# Patient Record
Sex: Female | Born: 1955 | Race: Black or African American | Hispanic: No | Marital: Single | Smoking: Never smoker
Health system: Southern US, Community
[De-identification: ages and names within clinical notes are randomized; demographics above are authoritative.]

## PROBLEM LIST (undated history)

## (undated) DIAGNOSIS — Z79891 Long term (current) use of opiate analgesic: Secondary | ICD-10-CM

## (undated) DIAGNOSIS — K219 Gastro-esophageal reflux disease without esophagitis: Secondary | ICD-10-CM

## (undated) DIAGNOSIS — F329 Major depressive disorder, single episode, unspecified: Secondary | ICD-10-CM

## (undated) DIAGNOSIS — C50919 Malignant neoplasm of unspecified site of unspecified female breast: Secondary | ICD-10-CM

## (undated) DIAGNOSIS — I10 Essential (primary) hypertension: Secondary | ICD-10-CM

## (undated) DIAGNOSIS — Z66 Do not resuscitate: Secondary | ICD-10-CM

## (undated) DIAGNOSIS — C801 Malignant (primary) neoplasm, unspecified: Secondary | ICD-10-CM

## (undated) DIAGNOSIS — M25561 Pain in right knee: Secondary | ICD-10-CM

## (undated) DIAGNOSIS — M25562 Pain in left knee: Secondary | ICD-10-CM

## (undated) DIAGNOSIS — M1990 Unspecified osteoarthritis, unspecified site: Secondary | ICD-10-CM

## (undated) DIAGNOSIS — C7951 Secondary malignant neoplasm of bone: Secondary | ICD-10-CM

## (undated) DIAGNOSIS — E7800 Pure hypercholesterolemia, unspecified: Secondary | ICD-10-CM

## (undated) HISTORY — DX: Pain in left knee: M25.562

## (undated) HISTORY — PX: KNEE SURGERY: SHX244

## (undated) HISTORY — DX: Do not resuscitate: Z66

## (undated) HISTORY — DX: Secondary malignant neoplasm of bone: C79.51

## (undated) HISTORY — DX: Long term (current) use of opiate analgesic: Z79.891

## (undated) HISTORY — DX: Pain in right knee: M25.561

## (undated) HISTORY — PX: ABDOMINAL HYSTERECTOMY: SHX81

## (undated) HISTORY — PX: MASTECTOMY: SHX3

## (undated) HISTORY — DX: Malignant neoplasm of unspecified site of unspecified female breast: C50.919

---

## 1999-03-24 ENCOUNTER — Encounter: Admission: RE | Admit: 1999-03-24 | Discharge: 1999-03-26 | Payer: Self-pay | Admitting: *Deleted

## 2003-04-23 ENCOUNTER — Ambulatory Visit (HOSPITAL_COMMUNITY): Admission: RE | Admit: 2003-04-23 | Discharge: 2003-04-23 | Payer: Self-pay | Admitting: Unknown Physician Specialty

## 2003-04-24 ENCOUNTER — Ambulatory Visit (HOSPITAL_COMMUNITY): Admission: RE | Admit: 2003-04-24 | Discharge: 2003-04-24 | Payer: Self-pay | Admitting: Unknown Physician Specialty

## 2003-08-12 ENCOUNTER — Ambulatory Visit (HOSPITAL_COMMUNITY): Admission: RE | Admit: 2003-08-12 | Discharge: 2003-08-12 | Payer: Self-pay | Admitting: Orthopedic Surgery

## 2003-08-14 ENCOUNTER — Encounter (HOSPITAL_COMMUNITY): Admission: RE | Admit: 2003-08-14 | Discharge: 2003-09-13 | Payer: Self-pay | Admitting: Orthopedic Surgery

## 2003-09-15 ENCOUNTER — Encounter (HOSPITAL_COMMUNITY): Admission: RE | Admit: 2003-09-15 | Discharge: 2003-10-15 | Payer: Self-pay | Admitting: Orthopedic Surgery

## 2004-09-08 ENCOUNTER — Ambulatory Visit: Payer: Self-pay | Admitting: Family Medicine

## 2004-12-22 ENCOUNTER — Ambulatory Visit: Payer: Self-pay | Admitting: Family Medicine

## 2005-01-19 ENCOUNTER — Ambulatory Visit: Payer: Self-pay | Admitting: Family Medicine

## 2005-03-15 ENCOUNTER — Ambulatory Visit: Payer: Self-pay | Admitting: Family Medicine

## 2005-04-05 ENCOUNTER — Ambulatory Visit: Payer: Self-pay | Admitting: Family Medicine

## 2005-05-10 ENCOUNTER — Ambulatory Visit: Payer: Self-pay | Admitting: Family Medicine

## 2005-08-23 ENCOUNTER — Ambulatory Visit: Payer: Self-pay | Admitting: Family Medicine

## 2005-09-06 ENCOUNTER — Ambulatory Visit: Payer: Self-pay | Admitting: Family Medicine

## 2005-10-03 ENCOUNTER — Ambulatory Visit: Payer: Self-pay | Admitting: Family Medicine

## 2005-12-07 ENCOUNTER — Ambulatory Visit: Payer: Self-pay | Admitting: Family Medicine

## 2005-12-14 ENCOUNTER — Ambulatory Visit: Payer: Self-pay | Admitting: Family Medicine

## 2006-03-22 ENCOUNTER — Ambulatory Visit: Payer: Self-pay | Admitting: Family Medicine

## 2006-03-27 ENCOUNTER — Ambulatory Visit: Payer: Self-pay | Admitting: Family Medicine

## 2006-05-03 ENCOUNTER — Ambulatory Visit: Payer: Self-pay | Admitting: Family Medicine

## 2006-05-12 ENCOUNTER — Ambulatory Visit: Payer: Self-pay | Admitting: Family Medicine

## 2006-09-13 ENCOUNTER — Emergency Department (HOSPITAL_COMMUNITY): Admission: EM | Admit: 2006-09-13 | Discharge: 2006-09-13 | Payer: Self-pay | Admitting: Emergency Medicine

## 2007-01-16 ENCOUNTER — Encounter: Admission: RE | Admit: 2007-01-16 | Discharge: 2007-01-17 | Payer: Self-pay | Admitting: Orthopedic Surgery

## 2007-01-18 ENCOUNTER — Encounter: Admission: RE | Admit: 2007-01-18 | Discharge: 2007-02-05 | Payer: Self-pay | Admitting: Orthopedic Surgery

## 2008-04-02 ENCOUNTER — Emergency Department (HOSPITAL_COMMUNITY): Admission: EM | Admit: 2008-04-02 | Discharge: 2008-04-02 | Payer: Self-pay | Admitting: Emergency Medicine

## 2008-09-12 ENCOUNTER — Emergency Department (HOSPITAL_COMMUNITY): Admission: EM | Admit: 2008-09-12 | Discharge: 2008-09-12 | Payer: Self-pay | Admitting: Emergency Medicine

## 2009-05-20 ENCOUNTER — Ambulatory Visit (HOSPITAL_COMMUNITY): Admission: RE | Admit: 2009-05-20 | Discharge: 2009-05-20 | Payer: Self-pay | Admitting: Family Medicine

## 2009-05-26 ENCOUNTER — Encounter: Admission: RE | Admit: 2009-05-26 | Discharge: 2009-05-26 | Payer: Self-pay | Admitting: Family Medicine

## 2009-05-31 ENCOUNTER — Encounter: Admission: RE | Admit: 2009-05-31 | Discharge: 2009-05-31 | Payer: Self-pay | Admitting: General Surgery

## 2009-06-03 ENCOUNTER — Inpatient Hospital Stay (HOSPITAL_COMMUNITY): Admission: RE | Admit: 2009-06-03 | Discharge: 2009-06-05 | Payer: Self-pay | Admitting: General Surgery

## 2009-06-03 ENCOUNTER — Encounter (INDEPENDENT_AMBULATORY_CARE_PROVIDER_SITE_OTHER): Payer: Self-pay | Admitting: General Surgery

## 2009-07-17 ENCOUNTER — Ambulatory Visit (HOSPITAL_COMMUNITY): Payer: Self-pay | Admitting: Oncology

## 2009-07-17 ENCOUNTER — Encounter (HOSPITAL_COMMUNITY): Admission: RE | Admit: 2009-07-17 | Discharge: 2009-08-16 | Payer: Self-pay | Admitting: Oncology

## 2009-08-06 ENCOUNTER — Ambulatory Visit: Admission: RE | Admit: 2009-08-06 | Discharge: 2009-10-07 | Payer: Self-pay | Admitting: Radiation Oncology

## 2009-08-21 ENCOUNTER — Encounter (HOSPITAL_COMMUNITY): Admission: RE | Admit: 2009-08-21 | Discharge: 2009-09-20 | Payer: Self-pay | Admitting: Oncology

## 2009-09-09 ENCOUNTER — Ambulatory Visit (HOSPITAL_COMMUNITY): Payer: Self-pay | Admitting: Oncology

## 2009-10-06 ENCOUNTER — Encounter (HOSPITAL_COMMUNITY)
Admission: RE | Admit: 2009-10-06 | Discharge: 2009-10-16 | Payer: Self-pay | Source: Home / Self Care | Admitting: Oncology

## 2009-10-20 ENCOUNTER — Encounter (HOSPITAL_COMMUNITY)
Admission: RE | Admit: 2009-10-20 | Discharge: 2009-11-19 | Payer: Self-pay | Source: Home / Self Care | Admitting: Oncology

## 2009-11-24 ENCOUNTER — Encounter (HOSPITAL_COMMUNITY)
Admission: RE | Admit: 2009-11-24 | Discharge: 2009-12-24 | Payer: Self-pay | Source: Home / Self Care | Attending: Oncology | Admitting: Oncology

## 2010-01-12 ENCOUNTER — Encounter (HOSPITAL_COMMUNITY)
Admission: RE | Admit: 2010-01-12 | Discharge: 2010-02-11 | Payer: Self-pay | Source: Home / Self Care | Attending: Oncology | Admitting: Oncology

## 2010-01-14 ENCOUNTER — Ambulatory Visit (HOSPITAL_COMMUNITY): Payer: Self-pay | Admitting: Oncology

## 2010-02-05 ENCOUNTER — Other Ambulatory Visit (HOSPITAL_COMMUNITY): Payer: Self-pay | Admitting: Oncology

## 2010-02-05 DIAGNOSIS — C50919 Malignant neoplasm of unspecified site of unspecified female breast: Secondary | ICD-10-CM

## 2010-02-07 ENCOUNTER — Encounter: Payer: Self-pay | Admitting: Orthopedic Surgery

## 2010-02-11 LAB — COMPREHENSIVE METABOLIC PANEL
ALT: 13 U/L (ref 0–35)
Albumin: 3.3 g/dL — ABNORMAL LOW (ref 3.5–5.2)
Alkaline Phosphatase: 81 U/L (ref 39–117)
BUN: 16 mg/dL (ref 6–23)
Calcium: 9 mg/dL (ref 8.4–10.5)
Chloride: 100 mEq/L (ref 96–112)
Creatinine, Ser: 1.04 mg/dL (ref 0.4–1.2)
GFR calc non Af Amer: 55 mL/min — ABNORMAL LOW (ref >60)
Total Protein: 7.3 g/dL (ref 6.0–8.3)

## 2010-02-11 LAB — DIFFERENTIAL
Eosinophils Absolute: 0.1 10*3/uL (ref 0.0–0.7)
Lymphocytes Relative: 29 % (ref 12–46)
Lymphs Abs: 1.7 10*3/uL (ref 0.7–4.0)
Monocytes Relative: 7 % (ref 3–12)
Neutro Abs: 3.5 10*3/uL (ref 1.7–7.7)
Neutrophils Relative %: 61 % (ref 43–77)

## 2010-02-11 LAB — CBC
HCT: 35.5 % — ABNORMAL LOW (ref 36.0–46.0)
MCHC: 33.8 g/dL (ref 30.0–36.0)
MCV: 82.6 fL (ref 78.0–100.0)
RBC: 4.3 MIL/uL (ref 3.87–5.11)

## 2010-02-17 ENCOUNTER — Other Ambulatory Visit (HOSPITAL_COMMUNITY): Payer: Self-pay | Admitting: Oncology

## 2010-02-17 ENCOUNTER — Ambulatory Visit (HOSPITAL_COMMUNITY)
Admission: RE | Admit: 2010-02-17 | Discharge: 2010-02-17 | Disposition: A | Discharge status: Home / Self Care | Payer: Medicaid Other | Source: Ambulatory Visit | Attending: Oncology | Admitting: Oncology

## 2010-02-17 ENCOUNTER — Ambulatory Visit (HOSPITAL_COMMUNITY): Payer: Medicaid Other | Admitting: Oncology

## 2010-02-17 DIAGNOSIS — M79609 Pain in unspecified limb: Secondary | ICD-10-CM | POA: Insufficient documentation

## 2010-02-17 DIAGNOSIS — C7981 Secondary malignant neoplasm of breast: Secondary | ICD-10-CM | POA: Insufficient documentation

## 2010-02-17 DIAGNOSIS — R937 Abnormal findings on diagnostic imaging of other parts of musculoskeletal system: Secondary | ICD-10-CM | POA: Insufficient documentation

## 2010-02-17 DIAGNOSIS — C801 Malignant (primary) neoplasm, unspecified: Secondary | ICD-10-CM

## 2010-02-17 DIAGNOSIS — C50919 Malignant neoplasm of unspecified site of unspecified female breast: Secondary | ICD-10-CM

## 2010-02-17 DIAGNOSIS — Z853 Personal history of malignant neoplasm of breast: Secondary | ICD-10-CM

## 2010-03-10 ENCOUNTER — Ambulatory Visit (HOSPITAL_COMMUNITY): Payer: Medicaid Other

## 2010-03-10 ENCOUNTER — Other Ambulatory Visit (HOSPITAL_COMMUNITY): Payer: Self-pay | Admitting: Oncology

## 2010-03-10 ENCOUNTER — Encounter (HOSPITAL_COMMUNITY): Payer: Medicaid Other | Attending: Oncology

## 2010-03-10 DIAGNOSIS — E119 Type 2 diabetes mellitus without complications: Secondary | ICD-10-CM | POA: Insufficient documentation

## 2010-03-10 DIAGNOSIS — C773 Secondary and unspecified malignant neoplasm of axilla and upper limb lymph nodes: Secondary | ICD-10-CM | POA: Insufficient documentation

## 2010-03-10 DIAGNOSIS — C7951 Secondary malignant neoplasm of bone: Secondary | ICD-10-CM

## 2010-03-10 DIAGNOSIS — C7952 Secondary malignant neoplasm of bone marrow: Secondary | ICD-10-CM | POA: Insufficient documentation

## 2010-03-10 DIAGNOSIS — C50919 Malignant neoplasm of unspecified site of unspecified female breast: Secondary | ICD-10-CM | POA: Insufficient documentation

## 2010-03-10 LAB — DIFFERENTIAL
Lymphs Abs: 1.8 10*3/uL (ref 0.7–4.0)
Neutro Abs: 3.2 10*3/uL (ref 1.7–7.7)

## 2010-03-10 LAB — COMPREHENSIVE METABOLIC PANEL
Albumin: 3.3 g/dL — ABNORMAL LOW (ref 3.5–5.2)
CO2: 28 mEq/L (ref 19–32)
Chloride: 102 mEq/L (ref 96–112)
GFR calc Af Amer: >60 mL/min (ref >60)
Glucose, Bld: 233 mg/dL — ABNORMAL HIGH (ref 70–99)
Potassium: 4.2 mEq/L (ref 3.5–5.1)
Sodium: 137 mEq/L (ref 135–145)
Total Protein: 7.3 g/dL (ref 6.0–8.3)

## 2010-03-10 LAB — CBC
HCT: 37.3 % (ref 36.0–46.0)
MCH: 27 pg (ref 26.0–34.0)
MCV: 83.3 fL (ref 78.0–100.0)
Platelets: 200 10*3/uL (ref 150–400)
WBC: 5.5 10*3/uL (ref 4.0–10.5)

## 2010-03-16 ENCOUNTER — Ambulatory Visit (HOSPITAL_COMMUNITY): Payer: Self-pay

## 2010-03-16 ENCOUNTER — Other Ambulatory Visit (HOSPITAL_COMMUNITY): Payer: Self-pay | Admitting: Oncology

## 2010-03-16 ENCOUNTER — Encounter (HOSPITAL_COMMUNITY): Payer: Self-pay

## 2010-03-16 DIAGNOSIS — C50919 Malignant neoplasm of unspecified site of unspecified female breast: Secondary | ICD-10-CM

## 2010-03-18 ENCOUNTER — Encounter (HOSPITAL_COMMUNITY): Payer: Self-pay

## 2010-03-18 ENCOUNTER — Encounter (HOSPITAL_COMMUNITY)
Admission: RE | Admit: 2010-03-18 | Discharge: 2010-03-18 | Disposition: A | Discharge status: Home / Self Care | Payer: Medicaid Other | Source: Ambulatory Visit | Attending: Oncology | Admitting: Oncology

## 2010-03-18 ENCOUNTER — Encounter (HOSPITAL_COMMUNITY): Payer: Medicaid Other

## 2010-03-18 DIAGNOSIS — C7951 Secondary malignant neoplasm of bone: Secondary | ICD-10-CM | POA: Insufficient documentation

## 2010-03-18 DIAGNOSIS — C50919 Malignant neoplasm of unspecified site of unspecified female breast: Secondary | ICD-10-CM | POA: Insufficient documentation

## 2010-03-18 DIAGNOSIS — C7952 Secondary malignant neoplasm of bone marrow: Secondary | ICD-10-CM | POA: Insufficient documentation

## 2010-03-18 HISTORY — DX: Malignant (primary) neoplasm, unspecified: C80.1

## 2010-03-18 HISTORY — DX: Essential (primary) hypertension: I10

## 2010-03-18 MED ORDER — TECHNETIUM TC 99M MEDRONATE IV KIT
25.0000 | PACK | Freq: Once | INTRAVENOUS | Status: AC | PRN
  Administered 2010-03-18: 25.1 via INTRAVENOUS

## 2010-03-19 ENCOUNTER — Ambulatory Visit (HOSPITAL_COMMUNITY): Payer: Self-pay | Admitting: Oncology

## 2010-03-23 ENCOUNTER — Ambulatory Visit (HOSPITAL_COMMUNITY): Payer: Medicaid Other | Admitting: Oncology

## 2010-03-23 DIAGNOSIS — C50919 Malignant neoplasm of unspecified site of unspecified female breast: Secondary | ICD-10-CM

## 2010-03-29 LAB — DIFFERENTIAL
Basophils Absolute: 0 10*3/uL (ref 0.0–0.1)
Eosinophils Relative: 2 % (ref 0–5)
Lymphocytes Relative: 25 % (ref 12–46)
Monocytes Absolute: 0.3 10*3/uL (ref 0.1–1.0)
Monocytes Relative: 6 % (ref 3–12)
Neutro Abs: 3.8 10*3/uL (ref 1.7–7.7)

## 2010-03-29 LAB — COMPREHENSIVE METABOLIC PANEL
AST: 16 U/L (ref 0–37)
Albumin: 3.1 g/dL — ABNORMAL LOW (ref 3.5–5.2)
BUN: 17 mg/dL (ref 6–23)
Chloride: 103 mEq/L (ref 96–112)
Creatinine, Ser: 0.93 mg/dL (ref 0.4–1.2)
GFR calc Af Amer: >60 mL/min (ref >60)
Potassium: 4.1 mEq/L (ref 3.5–5.1)
Total Bilirubin: 0.4 mg/dL (ref 0.3–1.2)
Total Protein: 7.1 g/dL (ref 6.0–8.3)

## 2010-03-29 LAB — CBC
MCH: 27.4 pg (ref 26.0–34.0)
Platelets: 172 10*3/uL (ref 150–400)
RBC: 4.19 MIL/uL (ref 3.87–5.11)
RDW: 14.3 % (ref 11.5–15.5)
WBC: 5.7 10*3/uL (ref 4.0–10.5)

## 2010-03-30 LAB — COMPREHENSIVE METABOLIC PANEL
AST: 19 U/L (ref 0–37)
BUN: 11 mg/dL (ref 6–23)
CO2: 29 mEq/L (ref 19–32)
Calcium: 9.6 mg/dL (ref 8.4–10.5)
Chloride: 103 mEq/L (ref 96–112)
Creatinine, Ser: 0.95 mg/dL (ref 0.4–1.2)
GFR calc non Af Amer: >60 mL/min (ref >60)
Glucose, Bld: 223 mg/dL — ABNORMAL HIGH (ref 70–99)
Total Bilirubin: 0.4 mg/dL (ref 0.3–1.2)

## 2010-03-30 LAB — CBC
HCT: 36 % (ref 36.0–46.0)
Hemoglobin: 12.1 g/dL (ref 12.0–15.0)
MCH: 27.5 pg (ref 26.0–34.0)
MCHC: 33.6 g/dL (ref 30.0–36.0)
MCV: 81.8 fL (ref 78.0–100.0)
RBC: 4.4 MIL/uL (ref 3.87–5.11)

## 2010-03-30 LAB — DIFFERENTIAL
Basophils Absolute: 0 10*3/uL (ref 0.0–0.1)
Eosinophils Relative: 3 % (ref 0–5)
Lymphocytes Relative: 22 % (ref 12–46)
Lymphs Abs: 1.3 10*3/uL (ref 0.7–4.0)
Neutro Abs: 4.1 10*3/uL (ref 1.7–7.7)
Neutrophils Relative %: 70 % (ref 43–77)

## 2010-03-31 LAB — COMPREHENSIVE METABOLIC PANEL
Alkaline Phosphatase: 108 U/L (ref 39–117)
BUN: 14 mg/dL (ref 6–23)
CO2: 27 mEq/L (ref 19–32)
Calcium: 9.5 mg/dL (ref 8.4–10.5)
GFR calc non Af Amer: >60 mL/min (ref >60)
Glucose, Bld: 256 mg/dL — ABNORMAL HIGH (ref 70–99)
Potassium: 3.8 mEq/L (ref 3.5–5.1)
Total Protein: 7.7 g/dL (ref 6.0–8.3)

## 2010-04-01 LAB — CBC
HCT: 36 % (ref 36.0–46.0)
Hemoglobin: 11.9 g/dL — ABNORMAL LOW (ref 12.0–15.0)
MCH: 26.9 pg (ref 26.0–34.0)
MCHC: 33 g/dL (ref 30.0–36.0)
MCV: 81.6 fL (ref 78.0–100.0)
RBC: 4.41 MIL/uL (ref 3.87–5.11)

## 2010-04-01 LAB — COMPREHENSIVE METABOLIC PANEL
ALT: 12 U/L (ref 0–35)
AST: 15 U/L (ref 0–37)
Albumin: 3.3 g/dL — ABNORMAL LOW (ref 3.5–5.2)
BUN: 15 mg/dL (ref 6–23)
CO2: 25 mEq/L (ref 19–32)
CO2: 28 mEq/L (ref 19–32)
Calcium: 8.8 mg/dL (ref 8.4–10.5)
Calcium: 9.4 mg/dL (ref 8.4–10.5)
Chloride: 102 mEq/L (ref 96–112)
Creatinine, Ser: 0.86 mg/dL (ref 0.4–1.2)
GFR calc Af Amer: >60 mL/min (ref >60)
GFR calc non Af Amer: >60 mL/min (ref >60)
GFR calc non Af Amer: >60 mL/min (ref >60)
Glucose, Bld: 349 mg/dL — ABNORMAL HIGH (ref 70–99)
Sodium: 136 mEq/L (ref 135–145)
Total Bilirubin: 0.3 mg/dL (ref 0.3–1.2)
Total Protein: 7.8 g/dL (ref 6.0–8.3)

## 2010-04-01 LAB — DIFFERENTIAL
Basophils Absolute: 0 10*3/uL (ref 0.0–0.1)
Eosinophils Relative: 2 % (ref 0–5)
Lymphocytes Relative: 17 % (ref 12–46)
Lymphs Abs: 1.1 10*3/uL (ref 0.7–4.0)
Neutrophils Relative %: 75 % (ref 43–77)

## 2010-04-04 LAB — COMPREHENSIVE METABOLIC PANEL
ALT: 12 U/L (ref 0–35)
AST: 17 U/L (ref 0–37)
Alkaline Phosphatase: 228 U/L — ABNORMAL HIGH (ref 39–117)
CO2: 29 mEq/L (ref 19–32)
Calcium: 9.2 mg/dL (ref 8.4–10.5)
GFR calc Af Amer: >60 mL/min (ref >60)
Glucose, Bld: 436 mg/dL — ABNORMAL HIGH (ref 70–99)
Potassium: 4.1 mEq/L (ref 3.5–5.1)
Sodium: 135 mEq/L (ref 135–145)
Total Protein: 7.6 g/dL (ref 6.0–8.3)

## 2010-04-04 LAB — CANCER ANTIGEN 27.29: CA 27.29: 31 U/mL (ref 0–39)

## 2010-04-04 LAB — CBC
HCT: 34.9 % — ABNORMAL LOW (ref 36.0–46.0)
Hemoglobin: 11.3 g/dL — ABNORMAL LOW (ref 12.0–15.0)
MCHC: 32.5 g/dL (ref 30.0–36.0)
RDW: 15.3 % (ref 11.5–15.5)
WBC: 6.9 10*3/uL (ref 4.0–10.5)

## 2010-04-04 LAB — DIFFERENTIAL
Basophils Relative: 1 % (ref 0–1)
Eosinophils Absolute: 0.1 10*3/uL (ref 0.0–0.7)
Eosinophils Relative: 1 % (ref 0–5)
Lymphs Abs: 2.3 10*3/uL (ref 0.7–4.0)
Monocytes Absolute: 0.5 10*3/uL (ref 0.1–1.0)
Monocytes Relative: 7 % (ref 3–12)
Neutrophils Relative %: 57 % (ref 43–77)

## 2010-04-05 LAB — TYPE AND SCREEN

## 2010-04-05 LAB — BASIC METABOLIC PANEL
BUN: 12 mg/dL (ref 6–23)
CO2: 27 mEq/L (ref 19–32)
Chloride: 98 mEq/L (ref 96–112)
Creatinine, Ser: 0.8 mg/dL (ref 0.4–1.2)
Glucose, Bld: 497 mg/dL — ABNORMAL HIGH (ref 70–99)
Potassium: 4.2 mEq/L (ref 3.5–5.1)

## 2010-04-05 LAB — GLUCOSE, CAPILLARY
Glucose-Capillary: 204 mg/dL — ABNORMAL HIGH (ref 70–99)
Glucose-Capillary: 239 mg/dL — ABNORMAL HIGH (ref 70–99)
Glucose-Capillary: 248 mg/dL — ABNORMAL HIGH (ref 70–99)
Glucose-Capillary: 280 mg/dL — ABNORMAL HIGH (ref 70–99)
Glucose-Capillary: 397 mg/dL — ABNORMAL HIGH (ref 70–99)
Glucose-Capillary: 98 mg/dL (ref 70–99)

## 2010-04-05 LAB — CBC
HCT: 38.8 % (ref 36.0–46.0)
MCHC: 33.3 g/dL (ref 30.0–36.0)
MCV: 81.8 fL (ref 78.0–100.0)
Platelets: 241 10*3/uL (ref 150–400)
RDW: 15.1 % (ref 11.5–15.5)
WBC: 6.8 10*3/uL (ref 4.0–10.5)

## 2010-04-09 ENCOUNTER — Ambulatory Visit (HOSPITAL_COMMUNITY): Payer: Medicaid Other

## 2010-04-09 DIAGNOSIS — C801 Malignant (primary) neoplasm, unspecified: Secondary | ICD-10-CM

## 2010-04-09 DIAGNOSIS — M899 Disorder of bone, unspecified: Secondary | ICD-10-CM

## 2010-04-09 DIAGNOSIS — C50919 Malignant neoplasm of unspecified site of unspecified female breast: Secondary | ICD-10-CM

## 2010-04-09 DIAGNOSIS — M949 Disorder of cartilage, unspecified: Secondary | ICD-10-CM

## 2010-04-24 LAB — GC/CHLAMYDIA PROBE AMP, GENITAL
Chlamydia, DNA Probe: NEGATIVE
GC Probe Amp, Genital: NEGATIVE

## 2010-04-24 LAB — CBC
HCT: 36.6 % (ref 36.0–46.0)
Platelets: 234 10*3/uL (ref 150–400)
RDW: 13.6 % (ref 11.5–15.5)
WBC: 6.8 10*3/uL (ref 4.0–10.5)

## 2010-04-24 LAB — COMPREHENSIVE METABOLIC PANEL
AST: 18 U/L (ref 0–37)
Albumin: 2.9 g/dL — ABNORMAL LOW (ref 3.5–5.2)
Alkaline Phosphatase: 111 U/L (ref 39–117)
BUN: 12 mg/dL (ref 6–23)
Chloride: 106 mEq/L (ref 96–112)
GFR calc Af Amer: >60 mL/min (ref >60)
Potassium: 3.6 mEq/L (ref 3.5–5.1)
Sodium: 140 mEq/L (ref 135–145)
Total Protein: 6.9 g/dL (ref 6.0–8.3)

## 2010-04-24 LAB — WET PREP, GENITAL
Clue Cells Wet Prep HPF POC: NONE SEEN
Trich, Wet Prep: NONE SEEN

## 2010-04-24 LAB — DIFFERENTIAL
Basophils Relative: 1 % (ref 0–1)
Eosinophils Relative: 2 % (ref 0–5)
Monocytes Absolute: 0.5 10*3/uL (ref 0.1–1.0)
Monocytes Relative: 7 % (ref 3–12)
Neutro Abs: 4.1 10*3/uL (ref 1.7–7.7)

## 2010-04-27 ENCOUNTER — Encounter (HOSPITAL_COMMUNITY): Payer: Medicaid Other | Attending: Oncology | Admitting: Oncology

## 2010-04-27 ENCOUNTER — Other Ambulatory Visit (HOSPITAL_COMMUNITY): Payer: Self-pay | Admitting: Oncology

## 2010-04-27 DIAGNOSIS — C7952 Secondary malignant neoplasm of bone marrow: Secondary | ICD-10-CM | POA: Insufficient documentation

## 2010-04-27 DIAGNOSIS — E119 Type 2 diabetes mellitus without complications: Secondary | ICD-10-CM | POA: Insufficient documentation

## 2010-04-27 DIAGNOSIS — C801 Malignant (primary) neoplasm, unspecified: Secondary | ICD-10-CM

## 2010-04-27 DIAGNOSIS — C7951 Secondary malignant neoplasm of bone: Secondary | ICD-10-CM | POA: Insufficient documentation

## 2010-04-27 DIAGNOSIS — C50919 Malignant neoplasm of unspecified site of unspecified female breast: Secondary | ICD-10-CM | POA: Insufficient documentation

## 2010-04-27 DIAGNOSIS — C773 Secondary and unspecified malignant neoplasm of axilla and upper limb lymph nodes: Secondary | ICD-10-CM | POA: Insufficient documentation

## 2010-05-10 ENCOUNTER — Encounter (HOSPITAL_COMMUNITY): Payer: Medicaid Other

## 2010-05-10 ENCOUNTER — Ambulatory Visit (HOSPITAL_COMMUNITY): Payer: Medicaid Other

## 2010-05-10 DIAGNOSIS — C7981 Secondary malignant neoplasm of breast: Secondary | ICD-10-CM

## 2010-06-04 NOTE — Op Note (Signed)
NAME:  Kaylee Mitchell, Kaylee Mitchell                           ACCOUNT NO.:  [id number]   MEDICAL RECORD NO.:  [id number]                   PATIENT TYPE:  AMB   LOCATION:  DAY                                  FACILITY:  [facility]   PHYSICIAN:  Vickki Hearing, M.D.           DATE OF BIRTH:  1955/08/14   DATE OF PROCEDURE:  08/12/2003  DATE OF DISCHARGE:                                 OPERATIVE REPORT   PREOPERATIVE DIAGNOSIS:  Torn lateral meniscus, right knee.   POSTOPERATIVE DIAGNOSIS:  Torn lateral meniscus, right knee.   PROCEDURE:  1. Arthroscopy, right knee.  2. Lateral meniscectomy.  3. Chondroplasty of medial femoral condyle and patellofemoral trochlear     area.   SURGEON:  Vickki Hearing, M.D.   ANESTHESIA:  General.   INDICATIONS FOR PROCEDURE:  Right knee pain with mechanical symptoms.   FINDINGS:  The lateral meniscus was torn.  The medial femoral condyle had a  grade 2 lesion on the weightbearing surface, and the trochlea had a grade 2  lesion as well.   DETAILS OF PROCEDURE:  The patient was identified properly in the holding  area.  The correct surgical site was marked by the patient prior to any  sedatives given.  I then marked my initials over the same site.  The patient  was brought to the surgical suite in the supine position.  She was given  general anesthesia.  After sterile prep and drape, the mandatory time-out  was taken, and everyone agreed on the procedure, limb, and patient's  identity.  A two-incision technique was used to perform the arthroscopy. The  knee was injected with 10 mL of Marcaine with epinephrine. The knee was  entered via lateral portal.  A diagnostic arthroscopy was performed and  videotaped.  All structures were visualized and probed.  The findings are  noted as stated.  The tear was in the body of the lateral meniscus.  It was  treated with duckbill forceps and resected to a stable rim and then balanced  using a straight shaver blade and  90-degree ArthroCare wand.   The medial condyle was then addressed using the ArthroCare wand on the  setting of 3 and sealing the surrounding edges of the lesion.  We then  proceeded to the patellofemoral joint and performed the same maneuver.   Medial meniscus was normal to probing and visualization.  The lateral  femoral condyle was normal.  The gutters were normal.  The patellofemoral  joint had some synovitis but was essentially good in terms of its cartilage  on the patellar side, abnormal, of course on the trochlea.   Posterior cruciate and anterior cruciate ligaments were normal.   The knee was irrigated, suctioned dry.  The portals were closed with Steri-  Strips.  Marcaine 30 mL was injected.  A sterile dressing and CryoCuff were  applied.  The patient was extubated, taken to recovery  room in stable  condition.  Counts were correct.  There were no complications.   PLAN:  1. Discharge.  2. The patient can be full weightbearing.  3. Follow up in my office in 2 days.      ___________________________________________                                            Vickki Hearing, M.D.   SEH/MEDQ  D:  08/12/2003  T:  08/12/2003  Job:  [id number]

## 2010-06-04 NOTE — H&P (Signed)
NAMEVESNA, KABLE                             ACCOUNT NO.:  [id number]   MEDICAL RECORD NO.:  [id number]                  PATIENT TYPE:   LOCATION:                                       FACILITY:   PHYSICIAN:  Vickki Hearing, M.D.           DATE OF BIRTH:  10-Nov-1955   DATE OF ADMISSION:  DATE OF DISCHARGE:                                HISTORY & PHYSICAL   CHIEF COMPLAINT:  Right knee pain.   HISTORY:  A 55 year old female with torn lateral meniscus, complained of  right knee pain.  MRI showed lateral meniscal tear.   MEDICAL HISTORY AND REVIEW OF SYSTEMS:  1. History of joint pain and swelling.  2. Diabetes.  3. Depression.   ALLERGIES:  No allergies.   MEDICAL PROBLEMS:  1. Hypertension.  2. Diabetes.  3. Back problems.   ORTHOPEDIC PROBLEMS:  Back pain.   MEDICINES:  1. Actos.  2. Lisinopril.  3. Glipizide.  4. Zoloft.  5. Metformin.  6. Aspirin.   FAMILY HISTORY:  Kidney disease, arthritis.   FAMILY PHYSICIAN:  Dr. Dewaine Conger.   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  Has social habits of alcohol and smoking on  occasion.   EXAM:  VITAL SIGNS:  Weight 261, pulse 80, respiratory rate 20.  APPEARANCE:  Normal development, grooming and hygiene.  CARDIOVASCULAR:  No swelling.  Pulses intact.  LYMPH NODES:  Lymph nodes in the cervical spine are normal.  EXTREMITIES:  Knee exam shows medial joint line tenderness, full range of  motion.  No contracture.  Normal stability in the ligaments.  SKIN:  No lesions or subcutaneous mass.  NEUROLOGICAL:  Oriented to person, place and time.  Mood and affect normal.  Sensation intact.   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  MRI showed arthritis with a joint effusion as well as  longitudinal tear of the lateral meniscus anterior horn.   She also had an ultrasound which showed normal popliteal vein and the  diagnosis is lateral meniscal tear with arthritis.   PLAN:  Arthroscopy right  knee, lateral meniscectomy.     ___________________________________________                                         Vickki Hearing, M.D.   SEH/MEDQ  D:  08/11/2003  T:  08/11/2003  Job:  [id number]

## 2010-06-09 ENCOUNTER — Encounter (HOSPITAL_COMMUNITY): Payer: Medicaid Other

## 2010-06-09 ENCOUNTER — Other Ambulatory Visit (HOSPITAL_COMMUNITY): Payer: Self-pay | Admitting: Oncology

## 2010-06-09 ENCOUNTER — Encounter (HOSPITAL_COMMUNITY): Payer: Medicaid Other | Attending: Oncology

## 2010-06-09 DIAGNOSIS — C7952 Secondary malignant neoplasm of bone marrow: Secondary | ICD-10-CM

## 2010-06-09 DIAGNOSIS — C773 Secondary and unspecified malignant neoplasm of axilla and upper limb lymph nodes: Secondary | ICD-10-CM | POA: Insufficient documentation

## 2010-06-09 DIAGNOSIS — C439 Malignant melanoma of skin, unspecified: Secondary | ICD-10-CM

## 2010-06-09 DIAGNOSIS — C7951 Secondary malignant neoplasm of bone: Secondary | ICD-10-CM

## 2010-06-09 DIAGNOSIS — E119 Type 2 diabetes mellitus without complications: Secondary | ICD-10-CM | POA: Insufficient documentation

## 2010-06-09 DIAGNOSIS — C50919 Malignant neoplasm of unspecified site of unspecified female breast: Secondary | ICD-10-CM | POA: Insufficient documentation

## 2010-06-09 LAB — COMPREHENSIVE METABOLIC PANEL
ALT: 10 U/L (ref 0–35)
Alkaline Phosphatase: 80 U/L (ref 39–117)
BUN: 17 mg/dL (ref 6–23)
CO2: 28 mEq/L (ref 19–32)
Calcium: 9.6 mg/dL (ref 8.4–10.5)
GFR calc non Af Amer: >60 mL/min (ref >60)
Glucose, Bld: 402 mg/dL — ABNORMAL HIGH (ref 70–99)
Potassium: 4.4 mEq/L (ref 3.5–5.1)
Sodium: 135 mEq/L (ref 135–145)

## 2010-06-30 ENCOUNTER — Encounter (HOSPITAL_COMMUNITY): Payer: Self-pay

## 2010-06-30 ENCOUNTER — Encounter (HOSPITAL_COMMUNITY): Payer: Medicaid Other

## 2010-06-30 ENCOUNTER — Encounter (HOSPITAL_COMMUNITY)
Admission: RE | Admit: 2010-06-30 | Discharge: 2010-06-30 | Disposition: A | Discharge status: Home / Self Care | Payer: Medicaid Other | Source: Ambulatory Visit | Attending: Oncology | Admitting: Oncology

## 2010-06-30 DIAGNOSIS — C50919 Malignant neoplasm of unspecified site of unspecified female breast: Secondary | ICD-10-CM

## 2010-06-30 DIAGNOSIS — M25569 Pain in unspecified knee: Secondary | ICD-10-CM | POA: Insufficient documentation

## 2010-06-30 DIAGNOSIS — C7952 Secondary malignant neoplasm of bone marrow: Secondary | ICD-10-CM | POA: Insufficient documentation

## 2010-06-30 DIAGNOSIS — C7951 Secondary malignant neoplasm of bone: Secondary | ICD-10-CM | POA: Insufficient documentation

## 2010-06-30 MED ORDER — TECHNETIUM TC 99M MEDRONATE IV KIT
25.0000 | PACK | Freq: Once | INTRAVENOUS | Status: AC | PRN
  Administered 2010-06-30: 25 via INTRAVENOUS

## 2010-07-02 ENCOUNTER — Encounter (HOSPITAL_COMMUNITY): Payer: Medicaid Other | Attending: Oncology | Admitting: Oncology

## 2010-07-02 ENCOUNTER — Other Ambulatory Visit (HOSPITAL_COMMUNITY): Payer: Self-pay | Admitting: Oncology

## 2010-07-02 DIAGNOSIS — C7952 Secondary malignant neoplasm of bone marrow: Secondary | ICD-10-CM | POA: Insufficient documentation

## 2010-07-02 DIAGNOSIS — E119 Type 2 diabetes mellitus without complications: Secondary | ICD-10-CM | POA: Insufficient documentation

## 2010-07-02 DIAGNOSIS — C50919 Malignant neoplasm of unspecified site of unspecified female breast: Secondary | ICD-10-CM | POA: Insufficient documentation

## 2010-07-02 DIAGNOSIS — C7951 Secondary malignant neoplasm of bone: Secondary | ICD-10-CM | POA: Insufficient documentation

## 2010-07-02 DIAGNOSIS — C773 Secondary and unspecified malignant neoplasm of axilla and upper limb lymph nodes: Secondary | ICD-10-CM | POA: Insufficient documentation

## 2010-07-02 LAB — COMPREHENSIVE METABOLIC PANEL
ALT: 10 U/L (ref 0–35)
AST: 11 U/L (ref 0–37)
Alkaline Phosphatase: 97 U/L (ref 39–117)
CO2: 28 mEq/L (ref 19–32)
Calcium: 10.3 mg/dL (ref 8.4–10.5)
Chloride: 100 mEq/L (ref 96–112)
GFR calc non Af Amer: 57 mL/min — ABNORMAL LOW (ref >60)
Potassium: 4.3 mEq/L (ref 3.5–5.1)
Sodium: 137 mEq/L (ref 135–145)
Total Bilirubin: 0.5 mg/dL (ref 0.3–1.2)

## 2010-07-02 LAB — DIFFERENTIAL
Basophils Absolute: 0 10*3/uL (ref 0.0–0.1)
Lymphocytes Relative: 28 % (ref 12–46)
Neutro Abs: 3.4 10*3/uL (ref 1.7–7.7)

## 2010-07-02 LAB — CBC
Platelets: ADEQUATE 10*3/uL (ref 150–400)
RBC: 4.62 MIL/uL (ref 3.87–5.11)
RDW: 14 % (ref 11.5–15.5)
WBC: 5.4 10*3/uL (ref 4.0–10.5)

## 2010-07-09 ENCOUNTER — Ambulatory Visit (HOSPITAL_COMMUNITY): Payer: Medicaid Other

## 2010-07-13 ENCOUNTER — Encounter (HOSPITAL_COMMUNITY): Payer: Medicaid Other

## 2010-07-13 DIAGNOSIS — C7951 Secondary malignant neoplasm of bone: Secondary | ICD-10-CM

## 2010-07-13 DIAGNOSIS — C50919 Malignant neoplasm of unspecified site of unspecified female breast: Secondary | ICD-10-CM

## 2010-07-13 DIAGNOSIS — C7952 Secondary malignant neoplasm of bone marrow: Secondary | ICD-10-CM

## 2010-07-14 ENCOUNTER — Other Ambulatory Visit (HOSPITAL_COMMUNITY): Payer: Self-pay | Admitting: Oncology

## 2010-07-14 DIAGNOSIS — C50919 Malignant neoplasm of unspecified site of unspecified female breast: Secondary | ICD-10-CM

## 2010-08-06 ENCOUNTER — Other Ambulatory Visit (HOSPITAL_COMMUNITY): Payer: Medicaid Other

## 2010-08-10 ENCOUNTER — Encounter (HOSPITAL_COMMUNITY): Payer: Medicaid Other | Attending: Oncology

## 2010-08-10 VITALS — BP 171/91 | HR 84 | Temp 98.5°F

## 2010-08-10 DIAGNOSIS — E119 Type 2 diabetes mellitus without complications: Secondary | ICD-10-CM | POA: Insufficient documentation

## 2010-08-10 DIAGNOSIS — C50919 Malignant neoplasm of unspecified site of unspecified female breast: Secondary | ICD-10-CM | POA: Insufficient documentation

## 2010-08-10 DIAGNOSIS — C7951 Secondary malignant neoplasm of bone: Secondary | ICD-10-CM | POA: Insufficient documentation

## 2010-08-10 DIAGNOSIS — C773 Secondary and unspecified malignant neoplasm of axilla and upper limb lymph nodes: Secondary | ICD-10-CM | POA: Insufficient documentation

## 2010-08-10 LAB — COMPREHENSIVE METABOLIC PANEL
Albumin: 3 g/dL — ABNORMAL LOW (ref 3.5–5.2)
BUN: 15 mg/dL (ref 6–23)
Chloride: 106 mEq/L (ref 96–112)
Creatinine, Ser: 0.77 mg/dL (ref 0.50–1.10)
Total Bilirubin: 0.3 mg/dL (ref 0.3–1.2)

## 2010-08-10 MED ORDER — OXYCODONE-ACETAMINOPHEN 5-325 MG PO TABS: ORAL_TABLET | ORAL | Status: DC

## 2010-08-10 MED ORDER — SODIUM CHLORIDE 0.9 % IV SOLN
60.0000 mg | Freq: Once | INTRAVENOUS | Status: AC
  Administered 2010-08-10: 60 mg via INTRAVENOUS
  Filled 2010-08-10: qty 500

## 2010-08-10 NOTE — Progress Notes (Signed)
Tolerated infusion well. 

## 2010-09-13 ENCOUNTER — Encounter (HOSPITAL_COMMUNITY): Payer: Self-pay

## 2010-09-13 ENCOUNTER — Encounter (HOSPITAL_COMMUNITY): Payer: Medicare Other | Attending: Oncology

## 2010-09-13 DIAGNOSIS — C50919 Malignant neoplasm of unspecified site of unspecified female breast: Secondary | ICD-10-CM | POA: Insufficient documentation

## 2010-09-13 DIAGNOSIS — C7951 Secondary malignant neoplasm of bone: Secondary | ICD-10-CM

## 2010-09-13 DIAGNOSIS — C7952 Secondary malignant neoplasm of bone marrow: Secondary | ICD-10-CM

## 2010-09-13 HISTORY — DX: Malignant neoplasm of unspecified site of unspecified female breast: C50.919

## 2010-09-13 HISTORY — DX: Secondary malignant neoplasm of bone: C79.51

## 2010-09-13 MED ORDER — SODIUM CHLORIDE 0.9 % IV SOLN
60.0000 mg | Freq: Once | INTRAVENOUS | Status: AC
  Administered 2010-09-13: 60 mg via INTRAVENOUS
  Filled 2010-09-13: qty 500

## 2010-09-13 MED ORDER — SODIUM CHLORIDE 0.9 % IV SOLN
INTRAVENOUS | Status: DC
  Administered 2010-09-13: 500 mL via INTRAVENOUS

## 2010-09-21 ENCOUNTER — Telehealth (HOSPITAL_COMMUNITY): Payer: Self-pay

## 2010-09-21 ENCOUNTER — Encounter (HOSPITAL_COMMUNITY)
Admission: RE | Admit: 2010-09-21 | Discharge: 2010-09-21 | Disposition: A | Discharge status: Home / Self Care | Payer: Medicaid Other | Source: Ambulatory Visit | Attending: Oncology | Admitting: Oncology

## 2010-09-21 ENCOUNTER — Encounter (HOSPITAL_COMMUNITY): Payer: Self-pay

## 2010-09-21 DIAGNOSIS — C50919 Malignant neoplasm of unspecified site of unspecified female breast: Secondary | ICD-10-CM | POA: Insufficient documentation

## 2010-09-21 DIAGNOSIS — C7951 Secondary malignant neoplasm of bone: Secondary | ICD-10-CM | POA: Insufficient documentation

## 2010-09-21 MED ORDER — TECHNETIUM TC 99M MEDRONATE IV KIT
25.0000 | PACK | Freq: Once | INTRAVENOUS | Status: AC | PRN
  Administered 2010-09-21: 25.1 via INTRAVENOUS

## 2010-09-21 NOTE — Telephone Encounter (Signed)
Notified Bone Scan stable and that we will continue same therapy.

## 2010-09-27 ENCOUNTER — Emergency Department (HOSPITAL_COMMUNITY)
Admission: EM | Admit: 2010-09-27 | Discharge: 2010-09-27 | Disposition: A | Discharge status: Home / Self Care | Payer: Medicaid Other | Attending: Emergency Medicine | Admitting: Emergency Medicine

## 2010-09-27 ENCOUNTER — Emergency Department (HOSPITAL_COMMUNITY): Payer: Medicaid Other

## 2010-09-27 ENCOUNTER — Encounter (HOSPITAL_COMMUNITY): Payer: Self-pay | Admitting: *Deleted

## 2010-09-27 DIAGNOSIS — C7951 Secondary malignant neoplasm of bone: Secondary | ICD-10-CM | POA: Insufficient documentation

## 2010-09-27 DIAGNOSIS — Z9010 Acquired absence of unspecified breast and nipple: Secondary | ICD-10-CM | POA: Insufficient documentation

## 2010-09-27 DIAGNOSIS — E7800 Pure hypercholesterolemia, unspecified: Secondary | ICD-10-CM | POA: Insufficient documentation

## 2010-09-27 DIAGNOSIS — Z79899 Other long term (current) drug therapy: Secondary | ICD-10-CM | POA: Insufficient documentation

## 2010-09-27 DIAGNOSIS — R0902 Hypoxemia: Secondary | ICD-10-CM

## 2010-09-27 DIAGNOSIS — F3289 Other specified depressive episodes: Secondary | ICD-10-CM | POA: Insufficient documentation

## 2010-09-27 DIAGNOSIS — F329 Major depressive disorder, single episode, unspecified: Secondary | ICD-10-CM | POA: Insufficient documentation

## 2010-09-27 DIAGNOSIS — D72829 Elevated white blood cell count, unspecified: Secondary | ICD-10-CM | POA: Insufficient documentation

## 2010-09-27 DIAGNOSIS — Z794 Long term (current) use of insulin: Secondary | ICD-10-CM | POA: Insufficient documentation

## 2010-09-27 DIAGNOSIS — E119 Type 2 diabetes mellitus without complications: Secondary | ICD-10-CM | POA: Insufficient documentation

## 2010-09-27 DIAGNOSIS — I10 Essential (primary) hypertension: Secondary | ICD-10-CM | POA: Insufficient documentation

## 2010-09-27 DIAGNOSIS — N289 Disorder of kidney and ureter, unspecified: Secondary | ICD-10-CM | POA: Insufficient documentation

## 2010-09-27 DIAGNOSIS — J189 Pneumonia, unspecified organism: Secondary | ICD-10-CM | POA: Insufficient documentation

## 2010-09-27 DIAGNOSIS — C7952 Secondary malignant neoplasm of bone marrow: Secondary | ICD-10-CM | POA: Insufficient documentation

## 2010-09-27 DIAGNOSIS — C50919 Malignant neoplasm of unspecified site of unspecified female breast: Secondary | ICD-10-CM | POA: Insufficient documentation

## 2010-09-27 HISTORY — DX: Pure hypercholesterolemia, unspecified: E78.00

## 2010-09-27 LAB — BASIC METABOLIC PANEL
Calcium: 9.8 mg/dL (ref 8.4–10.5)
Chloride: 98 mEq/L (ref 96–112)
Creatinine, Ser: 1.15 mg/dL — ABNORMAL HIGH (ref 0.50–1.10)
GFR calc Af Amer: 59 mL/min — ABNORMAL LOW (ref >60)

## 2010-09-27 LAB — DIFFERENTIAL
Basophils Absolute: 0 10*3/uL (ref 0.0–0.1)
Basophils Relative: 0 % (ref 0–1)
Eosinophils Relative: 2 % (ref 0–5)
Monocytes Absolute: 0.6 10*3/uL (ref 0.1–1.0)
Neutro Abs: 8.8 10*3/uL — ABNORMAL HIGH (ref 1.7–7.7)

## 2010-09-27 LAB — CBC
HCT: 39.2 % (ref 36.0–46.0)
MCHC: 32.1 g/dL (ref 30.0–36.0)
Platelets: 174 10*3/uL (ref 150–400)
RDW: 13.6 % (ref 11.5–15.5)

## 2010-09-27 MED ORDER — LEVOFLOXACIN 500 MG PO TABS: 750.0000 mg | ORAL_TABLET | Freq: Every day | ORAL | Status: DC

## 2010-09-27 MED ORDER — IPRATROPIUM BROMIDE 0.02 % IN SOLN
0.5000 mg | Freq: Once | RESPIRATORY_TRACT | Status: AC
  Administered 2010-09-27: 0.5 mg via RESPIRATORY_TRACT
  Filled 2010-09-27: qty 2.5

## 2010-09-27 MED ORDER — SODIUM CHLORIDE 0.9 % IV SOLN: INTRAVENOUS | Status: DC

## 2010-09-27 MED ORDER — ALBUTEROL SULFATE (5 MG/ML) 0.5% IN NEBU
5.0000 mg | INHALATION_SOLUTION | Freq: Once | RESPIRATORY_TRACT | Status: AC
  Administered 2010-09-27: 5 mg via RESPIRATORY_TRACT
  Filled 2010-09-27: qty 1

## 2010-09-27 MED ORDER — SODIUM CHLORIDE 0.9 % IV BOLUS (SEPSIS)
1000.0000 mL | Freq: Once | INTRAVENOUS | Status: AC
  Administered 2010-09-27: 1000 mL via INTRAVENOUS

## 2010-09-27 MED ORDER — GUAIFENESIN 100 MG/5ML PO LIQD
100.0000 mg | ORAL | Status: AC | PRN
Start: 2010-09-27 — End: 2010-10-07

## 2010-09-27 MED ORDER — LEVOFLOXACIN 750 MG PO TABS: 750.0000 mg | ORAL_TABLET | Freq: Every day | ORAL | Status: AC

## 2010-09-27 MED ORDER — DM-GUAIFENESIN ER 30-600 MG PO TB12
1.0000 | ORAL_TABLET | Freq: Two times a day (BID) | ORAL | Status: DC | PRN
Start: 2010-09-27 — End: 2010-09-27
  Filled 2010-09-27: qty 1

## 2010-09-27 MED ORDER — DEXTROSE 5 % IV SOLN
1.0000 g | Freq: Once | INTRAVENOUS | Status: AC
  Administered 2010-09-27: 1 g via INTRAVENOUS
  Filled 2010-09-27: qty 1

## 2010-09-27 MED ORDER — AZITHROMYCIN 250 MG PO TABS
500.0000 mg | ORAL_TABLET | Freq: Once | ORAL | Status: AC
  Administered 2010-09-27: 500 mg via ORAL
  Filled 2010-09-27: qty 2

## 2010-09-27 NOTE — ED Notes (Signed)
Cough, congestion, runny nose that started last night.  C/o increased soreness to abd and chest from coughing.  Also states is unable to sleep.

## 2010-09-27 NOTE — ED Provider Notes (Signed)
Scribed for Ward Givens, MD, the patient was seen in room [facility] . This chart was scribed by Ellie Lunch. This patient's care was started at 8:58 PM.   CSN: [id number] Arrival date & time: 09/27/2010  6:27 PM  Chief Complaint  Patient presents with  . Cough  . URI   HPI Kaylee Mitchell is a 55 y.o. female who presents to the Emergency Department complaining of productive cough starting last night. Pt has associated Chills, fever, n/v x 1 post -tussive, central chest and abd pain when coughing.  Denies diarrhea. Patient has SOB and states she had chills last night and probable fever. .  110 y.o. Son has a cold at home.    PCP Health Department Oncologist Dr Mickel Fuchs  Past Medical History  Diagnosis Date  . Breast Cancer   . Diabetes mellitus   . Hypertension   . Bone metastasis 09/13/2010  . High cholesterol   Depression  Past Surgical History  Procedure Date  . Mastectomy     right side   MEDICATIONS:  Previous Medications   ATORVASTATIN (LIPITOR) 20 MG TABLET    Take 20 mg by mouth daily.     CARVEDILOL (COREG) 25 MG TABLET    Take 25 mg by mouth daily.     INSULIN GLARGINE (LANTUS SOLOSTAR) 100 UNIT/ML INJECTION    Inject 76 Units into the skin as directed.    LISINOPRIL PO    Take 1 tablet by mouth daily.     LISINOPRIL-HYDROCHLOROTHIAZIDE (PRINZIDE,ZESTORETIC) 20-25 MG PER TABLET    Take 1 tablet by mouth daily.     OXYCODONE-ACETAMINOPHEN (PERCOCET) 5-325 MG PER TABLET    Take one - two tablets every 4-6 hours PRN pain   SERTRALINE (ZOLOFT) 50 MG TABLET    Take 75 mg by mouth daily.     TAMOXIFEN (NOLVADEX) 10 MG TABLET    Take 20 mg by mouth daily.    81 mg aspirin  ALLERGIES:  Allergies as of 09/27/2010  . (No Known Allergies)     No family history on file.  History  Substance Use Topics  . Smoking status: Never Smoker   . Smokeless tobacco: Not on file  . Alcohol Use: No    Review of Systems  Constitutional: Positive for fever and chills.  HENT:  Positive for congestion, sore throat and rhinorrhea.   Respiratory: Positive for cough and chest tightness.   Gastrointestinal: Positive for nausea, vomiting and abdominal pain. Negative for diarrhea.  All other systems reviewed and are negative.    Physical Exam  BP 138/77  Pulse 115  Temp(Src) 100.4 F (38 C) (Oral)  Resp 20  Ht 5\' 5"  (1.651 m)  Wt 308 lb (139.708 kg)  BMI 51.25 kg/m2  SpO2 94%   Physical Exam  Vitals reviewed. Constitutional: She is oriented to person, place, and time. She appears well-developed and well-nourished.  HENT:  Head: Normocephalic and atraumatic.  Eyes: Conjunctivae and EOM are normal. Pupils are equal, round, and reactive to light.  Neck: Normal range of motion. Neck supple.  Cardiovascular: Normal rate, regular rhythm and normal heart sounds.   Pulmonary/Chest: Effort normal. No accessory muscle usage. She has decreased breath sounds. She has no wheezes. She has no rhonchi. She has no rales.  Abdominal: Soft. Bowel sounds are normal.  Musculoskeletal: Normal range of motion.  Neurological: She is alert and oriented to person, place, and time. She has normal reflexes.  Skin: Skin is warm and dry.  Psychiatric: She has a normal mood and affect. Her behavior is normal. Thought content normal.   Procedures  OTHER DATA REVIEWED: Nursing notes, vital signs, and past medical records reviewed.   DIAGNOSTIC STUDIES:    LABS  Results for orders placed during the hospital encounter of 09/27/10  CBC      Component Value Range   WBC 11.7 (*) 4.0 - 10.5 (K/uL)   RBC 4.48  3.87 - 5.11 (MIL/uL)   Hemoglobin 12.6  12.0 - 15.0 (g/dL)   HCT 16.1  09.6 - 04.5 (%)   MCV 87.5  78.0 - 100.0 (fL)   MCH 28.1  26.0 - 34.0 (pg)   MCHC 32.1  30.0 - 36.0 (g/dL)   RDW 40.9  81.1 - 91.4 (%)   Platelets 174  150 - 400 (K/uL)  DIFFERENTIAL      Component Value Range   Neutrophils Relative 75  43 - 77 (%)   Neutro Abs 8.8 (*) 1.7 - 7.7 (K/uL)   Lymphocytes  Relative 18  12 - 46 (%)   Lymphs Abs 2.1  0.7 - 4.0 (K/uL)   Monocytes Relative 5  3 - 12 (%)   Monocytes Absolute 0.6  0.1 - 1.0 (K/uL)   Eosinophils Relative 2  0 - 5 (%)   Eosinophils Absolute 0.2  0.0 - 0.7 (K/uL)   Basophils Relative 0  0 - 1 (%)   Basophils Absolute 0.0  0.0 - 0.1 (K/uL)  BASIC METABOLIC PANEL      Component Value Range   Sodium 135  135 - 145 (mEq/L)   Potassium 4.5  3.5 - 5.1 (mEq/L)   Chloride 98  96 - 112 (mEq/L)   CO2 28  19 - 32 (mEq/L)   Glucose, Bld 322 (*) 70 - 99 (mg/dL)   BUN 21  6 - 23 (mg/dL)   Creatinine, Ser 7.82 (*) 0.50 - 1.10 (mg/dL)   Calcium 9.8  8.4 - 95.6 (mg/dL)   GFR calc non Af Amer 49 (*) >60 (mL/min)   GFR calc Af Amer 59 (*) >60 (mL/min)   Pt has mild leukocytosis, hyperglycemia and renal insufficiency.     Dg Chest 2 View  09/27/2010  *RADIOLOGY REPORT*  Clinical Data: Cough, congestion, right breast cancer status post mastectomy  CHEST - 2 VIEW  Comparison: CT chest dated 07/23/2010  Findings: Left lower lobe opacity, suspicious for pneumonia.  Right lung is essentially clear. No pleural effusion or pneumothorax.  The heart is top normal in size.  Degenerative changes of the visualized thoracolumbar spine.  Surgical clips in the right axilla.  IMPRESSION: Left lower lobe opacity, suspicious for pneumonia.  Original Report Authenticated By: Charline Bills, M.D.   Nm Bone Scan Whole Body  09/21/2010  *RADIOLOGY REPORT*  Clinical Data: Breast cancer.  NUCLEAR MEDICINE WHOLE BODY BONE SCINTIGRAPHY  Technique:  Whole body anterior and posterior images were obtained approximately 3 hours after intravenous injection of radiopharmaceutical.  Radiopharmaceutical: 25.1 mCi technetium 1m MDP.  Comparison: 06/30/2010  Findings: Again noted are areas of abnormal uptake within the calvarium, ribs, left humerus and spine.  Possible abnormal sacral uptake, similar prior study.  No new areas of abnormal uptake.  IMPRESSION: Continued osseous  metastatic disease, similar to prior study.  Original Report Authenticated By: Cyndie Chime, M.D.    ED COURSE / COORDINATION OF CARE: 19:05 EDP at Pt bedside. Reviewed x ray and discussed diagnostic possibilities of pneumonia. Discussed possibilitiy of keeping Pt overnight because of  low 02 saturation of 91 % when ambulating on RA. Pt is a nonsmoker and it should be much higher than this.  Also need to consider risk of PE with her cancer history.  PT started on IV rocephin and oral zithromax for her CAP.   MDM:  8:44  Dr Orvan Falconer accepts for admission to Medical bed.  20:40 Dr Orvan Falconer wants patient to be discharged, he wants me to write her scripts for levaquin 750 mg x 5 days and mucinex.   MEDICATIONS GIVEN IN THE E.D.  Medications  sodium chloride 0.9 % bolus 1,000 mL (1000 mL Intravenous Given 09/27/10 2016)  0.9 %  sodium chloride infusion (not administered)  albuterol (PROVENTIL) (5 MG/ML) 0.5% nebulizer solution 5 mg (not administered)  ipratropium (ATROVENT) 0.02 % nebulizer solution 0.5 mg (not administered)  cefTRIAXone (ROCEPHIN) 1 g in dextrose 5 % 50 mL IVPB (1 g Intravenous New Bag 09/27/10 2015)  azithromycin (ZITHROMAX) tablet 500 mg (500 mg Oral Given 09/27/10 2015)     I personally performed the services described in this documentation, which was scribed in my presence. The recorded information has been reviewed and considered.Devoria Albe, MD, FACEP       Ward Givens, MD 09/28/10 0100

## 2010-09-27 NOTE — ED Notes (Signed)
Patient was assisted to the restroom and back. Tolerated well.

## 2010-09-28 NOTE — ED Notes (Signed)
Called by pharmacist that medicaid will not authorize levaquin- patient's chart states on levaquin for cap- patient with multiple comorbidities.  Medicaid will pay for avelox.    Hilario Quarry, MD 09/28/10 1021

## 2010-09-30 ENCOUNTER — Other Ambulatory Visit (HOSPITAL_COMMUNITY): Payer: Medicaid Other

## 2010-10-04 ENCOUNTER — Ambulatory Visit (HOSPITAL_COMMUNITY): Payer: Medicaid Other | Admitting: Oncology

## 2010-10-13 ENCOUNTER — Telehealth (HOSPITAL_COMMUNITY): Payer: Self-pay

## 2010-10-13 ENCOUNTER — Encounter (HOSPITAL_COMMUNITY): Payer: Medicare Other | Attending: Oncology

## 2010-10-13 ENCOUNTER — Encounter (HOSPITAL_COMMUNITY): Payer: Self-pay | Admitting: Oncology

## 2010-10-13 ENCOUNTER — Ambulatory Visit (HOSPITAL_COMMUNITY)
Admission: RE | Admit: 2010-10-13 | Discharge: 2010-10-13 | Disposition: A | Discharge status: Home / Self Care | Payer: Medicaid Other | Source: Ambulatory Visit | Attending: Oncology | Admitting: Oncology

## 2010-10-13 ENCOUNTER — Other Ambulatory Visit (HOSPITAL_COMMUNITY): Payer: Self-pay | Admitting: Oncology

## 2010-10-13 ENCOUNTER — Encounter (HOSPITAL_BASED_OUTPATIENT_CLINIC_OR_DEPARTMENT_OTHER): Payer: Medicare Other | Admitting: Oncology

## 2010-10-13 VITALS — BP 131/78 | HR 99 | Temp 98.3°F

## 2010-10-13 DIAGNOSIS — C50919 Malignant neoplasm of unspecified site of unspecified female breast: Secondary | ICD-10-CM | POA: Insufficient documentation

## 2010-10-13 DIAGNOSIS — C7952 Secondary malignant neoplasm of bone marrow: Secondary | ICD-10-CM

## 2010-10-13 DIAGNOSIS — R058 Other specified cough: Secondary | ICD-10-CM

## 2010-10-13 DIAGNOSIS — R059 Cough, unspecified: Secondary | ICD-10-CM | POA: Insufficient documentation

## 2010-10-13 DIAGNOSIS — C7951 Secondary malignant neoplasm of bone: Secondary | ICD-10-CM | POA: Insufficient documentation

## 2010-10-13 DIAGNOSIS — R05 Cough: Secondary | ICD-10-CM | POA: Insufficient documentation

## 2010-10-13 DIAGNOSIS — J189 Pneumonia, unspecified organism: Secondary | ICD-10-CM

## 2010-10-13 DIAGNOSIS — M25569 Pain in unspecified knee: Secondary | ICD-10-CM | POA: Insufficient documentation

## 2010-10-13 MED ORDER — SODIUM CHLORIDE 0.9 % IJ SOLN
INTRAMUSCULAR | Status: AC
  Administered 2010-10-13: 10 mL via INTRAVENOUS
  Filled 2010-10-13: qty 10

## 2010-10-13 MED ORDER — SODIUM CHLORIDE 0.9 % IJ SOLN
10.0000 mL | INTRAMUSCULAR | Status: DC | PRN
  Administered 2010-10-13: 10 mL via INTRAVENOUS
  Filled 2010-10-13: qty 10

## 2010-10-13 MED ORDER — SODIUM CHLORIDE 0.9 % IV SOLN
60.0000 mg | Freq: Once | INTRAVENOUS | Status: AC
  Administered 2010-10-13: 60 mg via INTRAVENOUS
  Filled 2010-10-13: qty 500

## 2010-10-13 MED ORDER — SULFAMETHOXAZOLE-TRIMETHOPRIM 800-160 MG PO TABS: 1.0000 | ORAL_TABLET | Freq: Two times a day (BID) | ORAL | Status: AC

## 2010-10-13 MED ORDER — SODIUM CHLORIDE 0.9 % IV SOLN
INTRAVENOUS | Status: DC
  Administered 2010-10-13: 09:00:00 via INTRAVENOUS

## 2010-10-13 NOTE — Progress Notes (Signed)
Josue Hector, MD [address] [facility] [place] [address]  1. Cough  sulfamethoxazole-trimethoprim (BACTRIM DS,SEPTRA DS) 800-160 MG per tablet  2. Sputum production    3. CAP (community acquired pneumonia)      INTERVAL HISTORY: Kaylee Mitchell 55 y.o. female was seen in the treatment room undergoing therapy  The patient was recently seen in the ER and diagnosed with CAP.  She was treated with IM Rocephin and Zithromax.  She continues to have a productive cough of yellow sputum.  She denies a sore throat, enlarged lymph nodes, SOB and chest pain.    Past Medical History  Diagnosis Date  . Cancer   . Diabetes mellitus   . Hypertension   . Bone metastasis 09/13/2010  . High cholesterol     has Breast ca and Bone metastasis on her problem list.      has no known allergies.  Ms. Knowles had no medications administered during this visit.  Past Surgical History  Procedure Date  . Mastectomy     right side    Denies any headaches, dizziness, double vision, fevers, chills, night sweats, nausea, vomiting, diarrhea, constipation, chest pain, heart palpitations, shortness of breath, blood in stool, black tarry stool, urinary pain, urinary burning, urinary frequency, hematuria.   PHYSICAL EXAMINATION  ECOG PERFORMANCE STATUS: 2 - Symptomatic, <50% confined to bed  There were no vitals filed for this visit.  GENERAL:no distress, well nourished, well developed, comfortable, cooperative, obese and protective mask over nose and mouth SKIN: skin color, texture, turgor are normal HEAD: Normocephalic EYES: normal EARS: External ears normal OROPHARYNX:no erythema and mucous membranes are moist  NECK: supple, trachea midline LYMPH:  not examined BREAST:not examined LUNGS: clear to auscultation  HEART: regular rate & rhythm, no murmurs, no gallops, S1 normal and S2 normal ABDOMEN:not examined BACK: Back symmetric, no  curvature. EXTREMITIES:not examined  NEURO: alert & oriented x 3 with fluent speech, no focal motor/sensory deficits    LABORATORY DATA: CBC    Component Value Date/Time   WBC 11.7* 09/27/2010 1937   RBC 4.48 09/27/2010 1937   HGB 12.6 09/27/2010 1937   HCT 39.2 09/27/2010 1937   PLT 174 09/27/2010 1937   MCV 87.5 09/27/2010 1937   MCH 28.1 09/27/2010 1937   MCHC 32.1 09/27/2010 1937   RDW 13.6 09/27/2010 1937   LYMPHSABS 2.1 09/27/2010 1937   MONOABS 0.6 09/27/2010 1937   EOSABS 0.2 09/27/2010 1937   BASOSABS 0.0 09/27/2010 1937      Chemistry      Component Value Date/Time   NA 135 09/27/2010 1937   K 4.5 09/27/2010 1937   CL 98 09/27/2010 1937   CO2 28 09/27/2010 1937   BUN 21 09/27/2010 1937   CREATININE 1.15* 09/27/2010 1937      Component Value Date/Time   CALCIUM 9.8 09/27/2010 1937   ALKPHOS 75 08/10/2010 0849   AST 14 08/10/2010 0849   ALT 10 08/10/2010 0849   BILITOT 0.3 08/10/2010 0849          ASSESSMENT:  1. CAP, S/P Rocephin IM and Zithromax without improvement 2. LE pain   PLAN:  1. LE X-ray, ordered by Dr. Mariel Sleet 2. Chest X-ray, evaluate for pneumonia 3. Septra DS BID, PO, x 10 days 4. Return as scheduled   All questions were answered. The patient knows to call the clinic with any problems, questions or concerns. We can certainly see the patient much sooner if necessary.  The  patient and plan discussed with Glenford Peers, MD and he is in agreement with the aforementioned.   Aisling Emigh

## 2010-10-13 NOTE — Progress Notes (Signed)
Addended by: Edythe Lynn A on: 10/13/2010 11:18 AM   Modules accepted: Orders

## 2010-10-13 NOTE — Progress Notes (Signed)
Tolerated pamidronate well.  D/C from clinic to xray for diagnostic rt knee per Dr Carlena Bjornstad.

## 2010-10-13 NOTE — Telephone Encounter (Signed)
Returned earlier call re: referral to arthropod.  Request for referral faxed to Dr. Romeo Apple.

## 2010-10-13 NOTE — Telephone Encounter (Signed)
Phoned and left msg with relative to have [facility] call clinic.  Knee x-ray showed  arthritis per Neijstrom.   Need to ask [place] if she would like  Referral to Dr. Hilda Lias or with Dr. Romeo Apple.

## 2010-10-29 LAB — POCT CARDIAC MARKERS
Myoglobin, poc: 54.9
Operator id: 208401
Troponin i, poc: <0.05

## 2010-10-29 LAB — CBC
HCT: 38.8
MCV: 86.9
RBC: 4.47
WBC: 6.2

## 2010-10-29 LAB — BASIC METABOLIC PANEL
Chloride: 100
GFR calc Af Amer: >60
Potassium: 4.1

## 2010-10-29 LAB — DIFFERENTIAL
Eosinophils Absolute: 0.1
Eosinophils Relative: 2
Lymphs Abs: 1.2
Monocytes Absolute: 0.3
Monocytes Relative: 5

## 2010-11-04 ENCOUNTER — Ambulatory Visit (INDEPENDENT_AMBULATORY_CARE_PROVIDER_SITE_OTHER): Payer: Medicaid Other | Admitting: Orthopedic Surgery

## 2010-11-04 ENCOUNTER — Encounter: Payer: Self-pay | Admitting: Orthopedic Surgery

## 2010-11-04 VITALS — BP 146/72 | Ht 65.0 in | Wt 314.4 lb

## 2010-11-04 DIAGNOSIS — M7050 Other bursitis of knee, unspecified knee: Secondary | ICD-10-CM | POA: Insufficient documentation

## 2010-11-04 DIAGNOSIS — M76899 Other specified enthesopathies of unspecified lower limb, excluding foot: Secondary | ICD-10-CM

## 2010-11-04 MED ORDER — METHYLPREDNISOLONE ACETATE 40 MG/ML IJ SUSP: 40.0000 mg | Freq: Once | INTRAMUSCULAR | Status: DC

## 2010-11-04 NOTE — Patient Instructions (Signed)
You have received a steroid shot. 15% of patients experience increased pain at the injection site with in the next 24 hours. This is best treated with ice and tylenol extra strength 2 tabs every 8 hours. If you are still having pain please call the office.    

## 2010-11-04 NOTE — Progress Notes (Signed)
Chief complaint my LEFT leg is giving me a lot of trouble.  The patient complains of pain in the LEFT knee and the LEFT hip.  She says it started a while ago and now hurts all the time.  She is burning throbbing pain over the medial joint line which last week caused her to require a walker to ambulate.  Her pain is 9/10.  It seems to be worse in the morning worse at night.  She has not found anything that can make it better.  She complains of some locking from the hip area.  Review of systems are negative except for some depression she does have a history of cancer with a static disease to bone.  Past Surgical History  Procedure Date  . Mastectomy     right side  . Knee surgery     right    Past Medical History  Diagnosis Date  . Cancer   . Diabetes mellitus   . Hypertension   . Bone metastasis 09/13/2010  . High cholesterol     Physical Exam(12) GENERAL: normal development   CDV: pulses are normal   Skin: normal  Lymph: nodes were not palpable/normal  Psychiatric: awake, alert and oriented  Neuro: normal sensation  MSK Today she is ambulating without any assistive devices area 1The LEFT knee is tender over the medial bursa 2 Knee flexion is normal 3 Knee stability is normal 4 Strength and muscle tone are normal 5 Skin is normal 6  Meniscal signs are normal  X-rays were done already and it did not show any acute processes.  Assessment: Diagnosis bursitis pes anserine tendons    Plan: Injection  Knee  Injection Procedure Note  Pre-operative Diagnosis: left knee Bursitis  Post-operative Diagnosis: same  Indications: pain  Anesthesia: ethyl chloride   Procedure Details   Verbal consent was obtained for the procedure. Time out was completed.The Skin was prepped with alcohol, followed by  Ethyl chloride spray and A 25 gauge needle was inserted into the pes bursa of the  knee via lateral approach; 4ml 1% lidocaine and 1 ml of depomedrol  was then injected into  the joint . The needle was removed and the area cleansed and dressed.  Complications:  None; patient tolerated the procedure well.

## 2010-11-10 ENCOUNTER — Encounter (HOSPITAL_COMMUNITY): Payer: Medicaid Other | Attending: Oncology

## 2010-11-10 ENCOUNTER — Other Ambulatory Visit (HOSPITAL_COMMUNITY): Payer: Self-pay | Admitting: Oncology

## 2010-11-10 VITALS — BP 111/77 | HR 91 | Temp 98.4°F

## 2010-11-10 DIAGNOSIS — C50919 Malignant neoplasm of unspecified site of unspecified female breast: Secondary | ICD-10-CM | POA: Insufficient documentation

## 2010-11-10 DIAGNOSIS — C7951 Secondary malignant neoplasm of bone: Secondary | ICD-10-CM | POA: Insufficient documentation

## 2010-11-10 DIAGNOSIS — C7952 Secondary malignant neoplasm of bone marrow: Secondary | ICD-10-CM

## 2010-11-10 LAB — COMPREHENSIVE METABOLIC PANEL
ALT: 12 U/L (ref 0–35)
Calcium: 9.4 mg/dL (ref 8.4–10.5)
GFR calc Af Amer: 68 mL/min — ABNORMAL LOW (ref >90)
Glucose, Bld: 287 mg/dL — ABNORMAL HIGH (ref 70–99)
Sodium: 136 mEq/L (ref 135–145)
Total Protein: 6.7 g/dL (ref 6.0–8.3)

## 2010-11-10 LAB — CBC
HCT: 34.3 % — ABNORMAL LOW (ref 36.0–46.0)
Hemoglobin: 11.1 g/dL — ABNORMAL LOW (ref 12.0–15.0)
MCHC: 32.4 g/dL (ref 30.0–36.0)

## 2010-11-10 MED ORDER — SODIUM CHLORIDE 0.9 % IV SOLN
60.0000 mg | Freq: Once | INTRAVENOUS | Status: AC
  Administered 2010-11-10: 60 mg via INTRAVENOUS
  Filled 2010-11-10: qty 500

## 2010-11-10 MED ORDER — SODIUM CHLORIDE 0.9 % IV SOLN
Freq: Once | INTRAVENOUS | Status: AC
  Administered 2010-11-10: 500 mL via INTRAVENOUS

## 2010-11-10 MED ORDER — SODIUM CHLORIDE 0.9 % IJ SOLN
INTRAMUSCULAR | Status: AC
  Administered 2010-11-10: 10 mL via INTRAVENOUS
  Filled 2010-11-10: qty 10

## 2010-11-10 NOTE — Progress Notes (Signed)
Tolerated pamidronate infusion without any complaints of discomfort.  To return to see MD on 11/20 and to get pamidronate on 11/21.  Plans to discuss how long she will receive pamidronate and possibility of getting port a catheter placed due to difficulty with IV access.

## 2010-12-07 ENCOUNTER — Encounter (HOSPITAL_COMMUNITY): Payer: Medicaid Other | Attending: Oncology | Admitting: Oncology

## 2010-12-07 DIAGNOSIS — E119 Type 2 diabetes mellitus without complications: Secondary | ICD-10-CM

## 2010-12-07 DIAGNOSIS — C50219 Malignant neoplasm of upper-inner quadrant of unspecified female breast: Secondary | ICD-10-CM

## 2010-12-07 DIAGNOSIS — F329 Major depressive disorder, single episode, unspecified: Secondary | ICD-10-CM

## 2010-12-07 DIAGNOSIS — C7951 Secondary malignant neoplasm of bone: Secondary | ICD-10-CM

## 2010-12-07 DIAGNOSIS — C50919 Malignant neoplasm of unspecified site of unspecified female breast: Secondary | ICD-10-CM

## 2010-12-07 LAB — COMPREHENSIVE METABOLIC PANEL
Alkaline Phosphatase: 71 U/L (ref 39–117)
BUN: 22 mg/dL (ref 6–23)
CO2: 31 mEq/L (ref 19–32)
Chloride: 100 mEq/L (ref 96–112)
GFR calc Af Amer: 57 mL/min — ABNORMAL LOW (ref >90)
GFR calc non Af Amer: 49 mL/min — ABNORMAL LOW (ref >90)
Glucose, Bld: 253 mg/dL — ABNORMAL HIGH (ref 70–99)
Potassium: 3.8 mEq/L (ref 3.5–5.1)
Total Bilirubin: 0.3 mg/dL (ref 0.3–1.2)
Total Protein: 7.1 g/dL (ref 6.0–8.3)

## 2010-12-07 NOTE — Progress Notes (Signed)
CC:   Delaney Meigs, M.D. Tilford Pillar, MD  DIAGNOSES: 1. 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     She presented with a 7 cm tumor, extensive LVI.  Tumor focally     extended to the anterior surface margin, 8 of 8 lymph nodes were     involved with metastatic disease.  She was ER positive 100%, PR     positive 99%, HER2 neu negative, Ki-67 marker 14%.  No liver     involvement, no lung involvement and she had a mastectomy followed     by radiation therapy to T4 through T7 due to severe involvement at     this area.  She was treated with Arimidex initially from 07/2009     through 03/23/2010 and was progressing by bone scan criteria.  We     switched her to tamoxifen which she is tolerating well and her bone     scan is now stable.  She has not received radiation therapy to the     chest wall or axilla. 2. She is doing really well right now.  She also has degenerative     joint disease of the spine in addition to her metastatic disease of     the spine.  She has some leg pain, saw Dr. Fuller Canada, who     gave her some type of injection it sounds like with mild     improvement but it was temporary. 3. Obesity. 4. Diabetes mellitus. 5. History of depression on therapy. 6. Hypercholesterolemia. 7. Poor dental hygiene with multiple missing teeth  Keylen is in really good spirits today.  She really does look good.  She is at least stable.  Her bone scan was stable in September.  Another one is due 12/04 and we will set that up.  Vital signs today show that she is 315 pounds.  Blood pressure 113/77. She has a pulse of 92 and regular, 18 respirations per minute, unremarkable.  She is in no acute distress.  She is afebrile.  Right now is pain-free she states.  She has no lymphadenopathy.  Right chest wall is clear.  The left breast is negative.  Heart shows a regular rhythm and rate without obvious murmur, rub or gallop.  Lungs  are clear to auscultation.  Abdomen is obese without obvious organomegaly.  She has puffiness of the ankles but no pitting edema.  So I think she is doing well.  I will get her back to see Jenita Seashore my PA in 8 weeks.  I will see her in 16 weeks.  I think the goal is to continue this therapy as long as it lasts.    ______________________________ Ladona Horns. Mariel Sleet, MD ESN/MEDQ  D:  12/07/2010  T:  12/07/2010  Job:  [id number]

## 2010-12-07 NOTE — Progress Notes (Signed)
This office note has been dictated.

## 2010-12-07 NOTE — Patient Instructions (Addendum)
Kaylee Mitchell  [id number] 02-03-1955   [facility]  Discharge Instructions  RECOMMENDATIONS MADE BY THE CONSULTANT AND ANY TEST RESULTS WILL BE SENT TO YOUR REFERRING DOCTOR.   EXAM FINDINGS BY MD TODAY AND SIGNS AND SYMPTOMS TO REPORT TO CLINIC OR PRIMARY MD: Per Randall An, MD, exam findings are normal.  MEDICATIONS PRESCRIBED: Tamoxifen dose adjusted to once a day.   INSTRUCTIONS GIVEN AND DISCUSSED: 1.  Keep your bone scan appointment in December 2012. 2.  See Tom in 8 weeks and Neijstrom in 16 weeks; be sure to keep those appointment.  I acknowledge that I have been informed and understand all the instructions given to me and received a copy. I do not have any more questions at this time, but understand that I may call the Specialty Clinic at [facility] at [phone] during business hours should I have any further questions or need assistance in obtaining follow-up care.    __________________________________________  _____________  __________ Signature of Patient or Authorized Representative            Date                   Time    __________________________________________ Nurse's Signature

## 2010-12-08 ENCOUNTER — Encounter (HOSPITAL_BASED_OUTPATIENT_CLINIC_OR_DEPARTMENT_OTHER): Payer: Medicaid Other

## 2010-12-08 DIAGNOSIS — C7951 Secondary malignant neoplasm of bone: Secondary | ICD-10-CM

## 2010-12-08 DIAGNOSIS — C50919 Malignant neoplasm of unspecified site of unspecified female breast: Secondary | ICD-10-CM

## 2010-12-08 DIAGNOSIS — C7952 Secondary malignant neoplasm of bone marrow: Secondary | ICD-10-CM

## 2010-12-08 MED ORDER — SODIUM CHLORIDE 0.9 % IV SOLN
Freq: Once | INTRAVENOUS | Status: AC
  Administered 2010-12-08: 09:00:00 via INTRAVENOUS

## 2010-12-08 MED ORDER — SODIUM CHLORIDE 0.9 % IJ SOLN
10.0000 mL | INTRAMUSCULAR | Status: DC | PRN
  Administered 2010-12-08: 10 mL
  Filled 2010-12-08: qty 10

## 2010-12-08 MED ORDER — SODIUM CHLORIDE 0.9 % IJ SOLN
INTRAMUSCULAR | Status: AC
  Administered 2010-12-08: 10 mL
  Filled 2010-12-08: qty 10

## 2010-12-08 MED ORDER — SODIUM CHLORIDE 0.9 % IV SOLN
60.0000 mg | Freq: Once | INTRAVENOUS | Status: AC
  Administered 2010-12-08: 60 mg via INTRAVENOUS
  Filled 2010-12-08: qty 500

## 2010-12-21 ENCOUNTER — Encounter (HOSPITAL_COMMUNITY)
Admission: RE | Admit: 2010-12-21 | Discharge: 2010-12-21 | Disposition: A | Discharge status: Home / Self Care | Payer: Medicaid Other | Source: Ambulatory Visit | Attending: Oncology | Admitting: Oncology

## 2010-12-21 ENCOUNTER — Other Ambulatory Visit (HOSPITAL_COMMUNITY): Payer: Self-pay | Admitting: Oncology

## 2010-12-21 ENCOUNTER — Encounter (HOSPITAL_COMMUNITY): Payer: Self-pay

## 2010-12-21 DIAGNOSIS — C50919 Malignant neoplasm of unspecified site of unspecified female breast: Secondary | ICD-10-CM

## 2010-12-21 DIAGNOSIS — C7951 Secondary malignant neoplasm of bone: Secondary | ICD-10-CM | POA: Insufficient documentation

## 2010-12-21 DIAGNOSIS — C7952 Secondary malignant neoplasm of bone marrow: Secondary | ICD-10-CM | POA: Insufficient documentation

## 2010-12-21 MED ORDER — OXYCODONE-ACETAMINOPHEN 5-325 MG PO TABS: ORAL_TABLET | ORAL | Status: DC

## 2010-12-21 MED ORDER — TECHNETIUM TC 99M MEDRONATE IV KIT
25.0000 | PACK | Freq: Once | INTRAVENOUS | Status: AC | PRN
  Administered 2010-12-21: 25 via INTRAVENOUS

## 2011-01-05 ENCOUNTER — Encounter (HOSPITAL_COMMUNITY): Payer: Medicaid Other | Attending: Oncology

## 2011-01-05 VITALS — BP 175/102 | HR 85 | Temp 97.9°F

## 2011-01-05 DIAGNOSIS — C50919 Malignant neoplasm of unspecified site of unspecified female breast: Secondary | ICD-10-CM | POA: Insufficient documentation

## 2011-01-05 DIAGNOSIS — C7951 Secondary malignant neoplasm of bone: Secondary | ICD-10-CM

## 2011-01-05 DIAGNOSIS — C7952 Secondary malignant neoplasm of bone marrow: Secondary | ICD-10-CM | POA: Insufficient documentation

## 2011-01-05 DIAGNOSIS — C50219 Malignant neoplasm of upper-inner quadrant of unspecified female breast: Secondary | ICD-10-CM

## 2011-01-05 LAB — COMPREHENSIVE METABOLIC PANEL
ALT: 11 U/L (ref 0–35)
AST: 15 U/L (ref 0–37)
Albumin: 3 g/dL — ABNORMAL LOW (ref 3.5–5.2)
Calcium: 9.3 mg/dL (ref 8.4–10.5)
Sodium: 139 mEq/L (ref 135–145)
Total Protein: 6.9 g/dL (ref 6.0–8.3)

## 2011-01-05 MED ORDER — SODIUM CHLORIDE 0.9 % IJ SOLN
INTRAMUSCULAR | Status: AC
  Administered 2011-01-05: 10 mL via INTRAVENOUS
  Filled 2011-01-05: qty 10

## 2011-01-05 MED ORDER — SODIUM CHLORIDE 0.9 % IV SOLN
60.0000 mg | Freq: Once | INTRAVENOUS | Status: AC
  Administered 2011-01-05: 60 mg via INTRAVENOUS
  Filled 2011-01-05: qty 500

## 2011-01-05 MED ORDER — SODIUM CHLORIDE 0.9 % IJ SOLN
INTRAMUSCULAR | Status: AC
  Filled 2011-01-05: qty 10

## 2011-01-05 NOTE — Progress Notes (Signed)
Tolerated pamidronate infusion well.

## 2011-01-18 DIAGNOSIS — M25561 Pain in right knee: Secondary | ICD-10-CM

## 2011-01-18 DIAGNOSIS — M25562 Pain in left knee: Secondary | ICD-10-CM

## 2011-01-18 HISTORY — DX: Pain in right knee: M25.561

## 2011-01-18 HISTORY — DX: Pain in right knee: M25.562

## 2011-02-04 ENCOUNTER — Ambulatory Visit (HOSPITAL_COMMUNITY): Payer: Medicaid Other

## 2011-02-04 ENCOUNTER — Ambulatory Visit (HOSPITAL_COMMUNITY): Payer: Medicaid Other | Admitting: Oncology

## 2011-02-07 ENCOUNTER — Ambulatory Visit (HOSPITAL_COMMUNITY): Payer: Medicaid Other | Admitting: Oncology

## 2011-02-08 ENCOUNTER — Encounter (HOSPITAL_COMMUNITY): Payer: Self-pay | Admitting: Oncology

## 2011-02-08 ENCOUNTER — Encounter (HOSPITAL_COMMUNITY): Payer: Medicaid Other | Attending: Oncology

## 2011-02-08 ENCOUNTER — Encounter (HOSPITAL_BASED_OUTPATIENT_CLINIC_OR_DEPARTMENT_OTHER): Payer: Medicaid Other | Admitting: Oncology

## 2011-02-08 VITALS — BP 133/90 | HR 96 | Temp 98.2°F

## 2011-02-08 DIAGNOSIS — R110 Nausea: Secondary | ICD-10-CM | POA: Insufficient documentation

## 2011-02-08 DIAGNOSIS — C7951 Secondary malignant neoplasm of bone: Secondary | ICD-10-CM

## 2011-02-08 DIAGNOSIS — C801 Malignant (primary) neoplasm, unspecified: Secondary | ICD-10-CM | POA: Insufficient documentation

## 2011-02-08 DIAGNOSIS — C50219 Malignant neoplasm of upper-inner quadrant of unspecified female breast: Secondary | ICD-10-CM

## 2011-02-08 DIAGNOSIS — C7952 Secondary malignant neoplasm of bone marrow: Secondary | ICD-10-CM | POA: Insufficient documentation

## 2011-02-08 DIAGNOSIS — C50919 Malignant neoplasm of unspecified site of unspecified female breast: Secondary | ICD-10-CM | POA: Insufficient documentation

## 2011-02-08 DIAGNOSIS — E119 Type 2 diabetes mellitus without complications: Secondary | ICD-10-CM

## 2011-02-08 LAB — COMPREHENSIVE METABOLIC PANEL
Alkaline Phosphatase: 60 U/L (ref 39–117)
BUN: 24 mg/dL — ABNORMAL HIGH (ref 6–23)
CO2: 27 mEq/L (ref 19–32)
Chloride: 108 mEq/L (ref 96–112)
Creatinine, Ser: 1.17 mg/dL — ABNORMAL HIGH (ref 0.50–1.10)
GFR calc non Af Amer: 51 mL/min — ABNORMAL LOW (ref >90)
Potassium: 3.9 mEq/L (ref 3.5–5.1)
Total Bilirubin: 0.3 mg/dL (ref 0.3–1.2)

## 2011-02-08 MED ORDER — SODIUM CHLORIDE 0.9 % IV SOLN
60.0000 mg | Freq: Once | INTRAVENOUS | Status: AC
  Administered 2011-02-08: 60 mg via INTRAVENOUS
  Filled 2011-02-08: qty 500

## 2011-02-08 MED ORDER — SODIUM CHLORIDE 0.9 % IV SOLN
INTRAVENOUS | Status: DC
  Administered 2011-02-08: 09:00:00 via INTRAVENOUS

## 2011-02-08 MED ORDER — SODIUM CHLORIDE 0.9 % IJ SOLN
10.0000 mL | INTRAMUSCULAR | Status: DC | PRN
  Administered 2011-02-08: 10 mL via INTRAVENOUS
  Filled 2011-02-08: qty 10

## 2011-02-08 MED ORDER — LORAZEPAM 0.5 MG PO TABS: 0.5000 mg | ORAL_TABLET | Freq: Three times a day (TID) | ORAL | Status: DC | PRN

## 2011-02-08 MED ORDER — SODIUM CHLORIDE 0.9 % IJ SOLN
INTRAMUSCULAR | Status: AC
  Administered 2011-02-08: 10 mL via INTRAVENOUS
  Filled 2011-02-08: qty 10

## 2011-02-08 MED ORDER — HYDROCODONE-ACETAMINOPHEN 7.5-300 MG PO TABS: 1.0000 | ORAL_TABLET | ORAL | Status: DC | PRN

## 2011-02-08 NOTE — Progress Notes (Signed)
Tolerated pamidronate infusion well. 

## 2011-02-08 NOTE — Progress Notes (Signed)
No primary provider on file. No primary provider on file.  1. Nausea  LORazepam (ATIVAN) 0.5 MG tablet  2. Breast ca    3. Bone metastasis      CURRENT THERAPY: On tamoxifen daily  INTERVAL HISTORY: Kaylee Mitchell 56 y.o. 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 She presented with a 7 cm tumor, extensive LVI. Tumor focally extended to the anterior surface margin, 8 of 8 lymph nodes were involved with metastatic disease. She was ER positive 100%, PR  positive 99%, HER2 neu negative, Ki-67 marker 14%. No liver  involvement, no lung involvement and she had a mastectomy followed by radiation therapy to T4 through T7 due to severe involvement at this area. She was treated with Arimidex initially from 07/2009 through 03/23/2010 and was progressing by bone scan criteria. We switched her to tamoxifen which she is tolerating well and her bone scan is now stable. She has not received radiation therapy to the chest wall or axilla.   The patient reports some nausea after taking her AM medications.  She explains that she cannot figure out if it is a particular medication or not because she take a number of medications in the morning.  In light of that, we will give her an Rx for Ativan 0.5 mg #30 that she can take as needed for nausea.  The patient also describes some hypoglycemic type symptoms in the AM when she is grocery shopping.  She reports feeling hot, sweating, and near synopal symptoms.  She reports that she is on insulin.  I have asked her to check her glucose in the AM, I suspect her glucose is low in the AM secondary to insulin.  I personally reviewed and went over radiographic studies with the patient.  Otherwise, the patient denies any complaints.  She reports a bought of constipation in the past which has resolved.   The patient reports continued left knee pain.  She is not taking her oxycodone  because she says it is ineffective.  She would like to try hydrocodone.  In light of hydrocodone being less potent than her oxycodone, I called in a RX for hydrocodone/APAP 7.5/300 mg # 90 with 2 refills.  The pharmacist reports she does not have any 5/325 mg and the FDA is beginning to phase out the 325 mg APAP.  Instead they have the 300 mg APAP.  Past Medical History  Diagnosis Date  . Cancer   . Diabetes mellitus   . Hypertension   . Bone metastasis 09/13/2010  . High cholesterol   . Knee pain, bilateral 2013    has Breast ca; Bone metastasis; and Bursitis of knee on her problem list.      has no known allergies.  Kaylee Mitchell had no medications administered during this visit.  Past Surgical History  Procedure Date  . Mastectomy     right side  . Knee surgery     right    Denies any headaches, dizziness, double vision, fevers, chills, night sweats, nausea, vomiting, diarrhea, chest pain, heart palpitations, shortness of breath, blood in stool, black tarry stool, urinary pain, urinary burning, urinary frequency, hematuria.   PHYSICAL EXAMINATION  ECOG PERFORMANCE STATUS: 1 - Symptomatic but completely ambulatory  There were no vitals filed for this visit.  GENERAL:alert, no distress, well nourished, well developed, comfortable, cooperative, obese and smiling SKIN: skin color, texture, turgor are normal,  no rashes or significant lesions HEAD: Normocephalic, No masses, lesions, tenderness or abnormalities EYES: normal EARS: External ears normal OROPHARYNX:mucous membranes are moist  NECK: supple, trachea midline LYMPH:  no palpable lymphadenopathy, difficult to assess for hepatosplenomegaly in light of patient position in chemotherapy recliner for pamidronate infusion. BREAST:not examined LUNGS: clear to auscultation  HEART: regular rate & rhythm, no murmurs, no gallops, S1 normal and S2 normal ABDOMEN:abdomen soft, non-tender, obese and normal bowel sounds BACK: Back  symmetric, no curvature., No CVA tenderness EXTREMITIES:less then 2 second capillary refill, no skin discoloration, no clubbing, no cyanosis  NEURO: alert & oriented x 3 with fluent speech, no focal motor/sensory deficits, gait normal   LABORATORY DATA: CBC    Component Value Date/Time   WBC 5.5 11/10/2010 0928   RBC 3.90 11/10/2010 0928   HGB 11.1* 11/10/2010 0928   HCT 34.3* 11/10/2010 0928   PLT 154 11/10/2010 0928   MCV 87.9 11/10/2010 0928   MCH 28.5 11/10/2010 0928   MCHC 32.4 11/10/2010 0928   RDW 13.7 11/10/2010 0928   LYMPHSABS 2.1 09/27/2010 1937   MONOABS 0.6 09/27/2010 1937   EOSABS 0.2 09/27/2010 1937   BASOSABS 0.0 09/27/2010 1937      Chemistry      Component Value Date/Time   NA 143 02/08/2011 0835   K 3.9 02/08/2011 0835   CL 108 02/08/2011 0835   CO2 27 02/08/2011 0835   BUN 24* 02/08/2011 0835   CREATININE 1.17* 02/08/2011 0835      Component Value Date/Time   CALCIUM 9.0 02/08/2011 0835   ALKPHOS 60 02/08/2011 0835   AST 16 02/08/2011 0835   ALT 13 02/08/2011 0835   BILITOT 0.3 02/08/2011 0835       RADIOGRAPHIC STUDIES:  12/21/10  *RADIOLOGY REPORT*  Clinical Data: Metastatic breast cancer, on treatment  NUCLEAR MEDICINE WHOLE BODY BONE SCINTIGRAPHY  Technique: Whole body anterior and posterior images were obtained  approximately 3 hours after intravenous injection of  radiopharmaceutical.  Radiopharmaceutical: 25 mCi Tc-49m MDP  Comparison: 09/21/2010  Radiographic correlation: Left knee radiographs 10/13/2010,  bilateral femoral radiographs 02/17/2010  Findings:  Patchy increased uptake in mid thoracic spine, upper lumbar spine,  lower lumbar spine, and scattered ribs, pattern most consistent  with metastatic disease.  Uptake again seen in the left humeral diaphysis consistent with  metastasis.  Questionable focus of increased uptake at the mid to distal left  femoral diaphysis.  Uptake in the shoulders and feet, right knee, as well as the    sternoclavicular joints bilaterally, typically degenerative.  No additional sites of abnormal increased tracer localization seen.  Expected urinary tract and soft tissue distribution of tracer.  IMPRESSION:  Uptake within the left humerus, ribs, thoracic spine and lumbar  spine compatible with osseous metastases.  Questionable tiny focus of abnormal uptake versus artifact at the  mid to distal left femoral diaphysis on the posterior view; no  definite site of abnormality is seen in this region on the prior  radiograph from 02/17/2010, and this site may be proximal to extent  of prior left knee radiographs.  If further imaging of this site is clinically indicated, consider  MRI.  Original Report Authenticated By: Lollie Marrow, M.D.    ASSESSMENT:  1. 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 She presented with a 7 cm tumor, extensive LVI. Tumor focally extended to the anterior surface margin, 8 of 8 lymph nodes were involved with metastatic disease. She  was ER positive 100%, PR  positive 99%, HER2 neu negative, Ki-67 marker 14%. No liver  involvement, no lung involvement and she had a mastectomy followed by radiation therapy to T4 through T7 due to severe involvement at this area. She was treated with Arimidex initially from 07/2009 through 03/23/2010 and was progressing by bone scan criteria. We switched her to tamoxifen which she is tolerating well and her bone scan is now stable. She has not received radiation therapy to the chest wall or axilla.  2. She is doing really well right now. She also has degenerative  joint disease of the spine in addition to her metastatic disease of the spine. She has some leg pain, saw Dr. Fuller Canada, who gave her some type of injection it sounds like with mild  improvement but it was temporary.  3. Obesity.  4. Diabetes mellitus.  5. History of depression on therapy.  6. Hypercholesterolemia.  7. Poor  dental hygiene with multiple missing teeth 8. Nausea following medication consumption 9. Hypoglycemic events in AM 10. Knee pain   PLAN:  1. Glucose check in AM and report results to physician managing insulin. 2. Rx for Hydrocodone/APAP 7.5/300 mg #90 3. Continue scheduled pamidronate infusions 4. Continue Tamoxifen daily 5. Rx for Ativan 0.5 mg #30 for nausea 6. I personally reviewed and went over radiographic studies with the patient. 7. Return as scheduled on 03/11/11 for next pamidronate infusion 8. Lab work on 03/11/11: CBC diff, CMET 9. Return for follow-up as scheduled to see Dr. Mariel Sleet in March 2013   All questions were answered. The patient knows to call the clinic with any problems, questions or concerns. We can certainly see the patient much sooner if necessary.  The patient and plan discussed with Glenford Peers, MD and he is in agreement with the aforementioned.  I spent 20 minutes counseling the patient face to face. The total time spent in the appointment was 30 minutes.  KEFALAS,THOMAS

## 2011-02-09 ENCOUNTER — Ambulatory Visit (INDEPENDENT_AMBULATORY_CARE_PROVIDER_SITE_OTHER): Payer: Medicaid Other | Admitting: Orthopedic Surgery

## 2011-02-09 ENCOUNTER — Encounter: Payer: Self-pay | Admitting: Orthopedic Surgery

## 2011-02-09 VITALS — BP 110/60 | Ht 65.0 in | Wt 308.0 lb

## 2011-02-09 DIAGNOSIS — M23329 Other meniscus derangements, posterior horn of medial meniscus, unspecified knee: Secondary | ICD-10-CM

## 2011-02-09 NOTE — Patient Instructions (Signed)
MRI LEFT KNEE @ [facility]   THIS WILL REQUIRE A PRE-CERTIFICATION FROM MEDICAID, PLEASE ALLOW 7-10 DAYS TO GET PRE-CERTIFICATION

## 2011-02-09 NOTE — Progress Notes (Signed)
Patient ID: Kaylee Mitchell, female   DOB: September 29, 1955, 56 y.o.   MRN: [id number]  Chief complaint continued pain LEFT leg.  If you can't fix my leg I can't come back.  Previous history of present illness Chief complaint my LEFT leg is giving me a lot of trouble. The patient complains of pain in the LEFT knee and the LEFT hip. She says it started a while ago and now hurts all the time. She is burning throbbing pain over the medial joint line which last week caused her to require a walker to ambulate. Her pain is 9/10. It seems to be worse in the morning worse at night. She has not found anything that can make it better. She complains of some locking from the hip area.   She is having severe pain in her LEFT lower extremity over the anteromedial aspect of her knee which is preventing her from ambulating.  She is requiring a walker to ambulate, she cannot sleep and hasn't been able to sleep for the last 3 months.  The previous injection did not help her pain.  Her leg is giving way.  The pain is unrelieved by hydrocodone 10 mg.  Exam reveals a healthy-appearing slightly obese normally developed [place] female who has normal perfusion to the LEFT lower extremity, there is normal skin tear it she's awake alert oriented x3 her mood and affect are normal.  She has difficulty getting out of the chair today.  Her ambulation is marked by a profound limp.  She has tenderness over the medial joint line her range of motion is limited to 80.  Muscle tone and strength are normal the knee appears stable.  The Apley's test  For meniscus pathology is positive  Impression torn medial meniscus LEFT knee  Plan MRI LEFT knee.

## 2011-02-14 ENCOUNTER — Telehealth: Payer: Self-pay | Admitting: Orthopedic Surgery

## 2011-02-14 NOTE — Telephone Encounter (Signed)
Patient called about receiving a letter from [employer] stating MRI is denied. Our office has just received the same letter by fax regarding the denial and we were about to call patient to relay.   Patient is not scheduled for a follow-up appointment as of this point.  She will contact Medicaid about the denial and any options she may have to appeal.  She will call us back to let us know what she finds out from [employer].

## 2011-02-18 NOTE — Telephone Encounter (Signed)
Dr. Romeo Apple, patient is trying to work with insurer [facility]) re: MRI denial.  She's asking if she is to come back, or if she needs to be doing anything else in the meantime?

## 2011-02-23 NOTE — Telephone Encounter (Signed)
SCHEDULE FOR APPT, I WOULD ADVISE HAVING SURGERY WITHOUT THE MRI

## 2011-02-24 NOTE — Telephone Encounter (Signed)
Called patient.  Reached her 02/24/11, and scheduled as per Dr. Mort Sawyers reply.

## 2011-03-09 ENCOUNTER — Encounter: Payer: Self-pay | Admitting: Orthopedic Surgery

## 2011-03-09 ENCOUNTER — Ambulatory Visit (INDEPENDENT_AMBULATORY_CARE_PROVIDER_SITE_OTHER): Payer: Medicaid Other | Admitting: Orthopedic Surgery

## 2011-03-09 VITALS — BP 132/60 | Ht 65.0 in | Wt 308.0 lb

## 2011-03-09 DIAGNOSIS — M23329 Other meniscus derangements, posterior horn of medial meniscus, unspecified knee: Secondary | ICD-10-CM | POA: Insufficient documentation

## 2011-03-09 NOTE — Patient Instructions (Signed)
You have been scheduled for surgery.  All surgeries carry some risk.  Remember you always have the option of continued nonsurgical treatment. However in this situation the risks vs. the benefits favor surgery as the best treatment option. The risks of the surgery includes the following but is not limited to bleeding, infection, pulmonary embolus, death from anesthesia, nerve injury vascular injury or need for further surgery, continued pain.  Specific to this procedure the following risks and complications are rare but possible Stiffness, pain, weakness, re\re tear.   Arthroscopic Procedure, Knee An arthroscopic procedure can find what is wrong with your knee. PROCEDURE Arthroscopy is a surgical technique that allows your orthopedic surgeon to diagnose and treat your knee injury with accuracy. They will look into your knee through a small instrument. This is almost like a small (pencil sized) telescope. Because arthroscopy affects your knee less than open knee surgery, you can anticipate a more rapid recovery. Taking an active role by following your caregiver's instructions will help with rapid and complete recovery. Use crutches, rest, elevation, ice, and knee exercises as instructed. The length of recovery depends on various factors including type of injury, age, physical condition, medical conditions, and your rehabilitation. Your knee is the joint between the large bones (femur and tibia) in your leg. Cartilage covers these bone ends which are smooth and slippery and allow your knee to bend and move smoothly. Two menisci, thick, semi-lunar shaped pads of cartilage which form a rim inside the joint, help absorb shock and stabilize your knee. Ligaments bind the bones together and support your knee joint. Muscles move the joint, help support your knee, and take stress off the joint itself. Because of this all programs and physical therapy to rehabilitate an injured or repaired knee require rebuilding and  strengthening your muscles. AFTER THE PROCEDURE  After the procedure, you will be moved to a recovery area until most of the effects of the medication have worn off. Your caregiver will discuss the test results with you.     Only take over-the-counter or prescription medicines for pain, discomfort, or fever as directed by your caregiver.  SEEK MEDICAL CARE IF:    You have increased bleeding from your wounds.     You see redness, swelling, or have increasing pain in your wounds.     You have pus coming from your wound.     You have an oral temperature above 102 F (38.9 C).     You notice a bad smell coming from the wound or dressing.     You have severe pain with any motion of your knee.  SEEK IMMEDIATE MEDICAL CARE IF:    You develop a rash.     You have difficulty breathing.     You have any allergic problems.  Document Released: 01/01/2000 Document Revised: 09/15/2010 Document Reviewed: 07/25/2007 [facility] Patient Information 2012 [place], [place].

## 2011-03-09 NOTE — Progress Notes (Signed)
Patient ID: Kaylee Mitchell, female   DOB: 1955/05/04, 56 y.o.   MRN: [id number] Chief Complaint  Patient presents with  . Follow-up    recheck Lt knee/discuss options, patient reports pain is keeping her up at night, MRI denied by insurance    Aggie Cosier and I have decided to go ahead and scope her LEFT knee secondary to severe pain despite not being able to obtain the MRI.  This is primarily based on her symptoms of pain and catching.  She will have arthroscopy of LEFT knee  Most likely she has a medial meniscal tear.  This procedure has been fully reviewed with the patient and written informed consent has been obtained.  preop visit   [employer] By reference

## 2011-03-10 ENCOUNTER — Telehealth: Payer: Self-pay | Admitting: Orthopedic Surgery

## 2011-03-10 ENCOUNTER — Other Ambulatory Visit: Payer: Self-pay | Admitting: *Deleted

## 2011-03-10 ENCOUNTER — Encounter (HOSPITAL_COMMUNITY): Payer: Self-pay | Admitting: Pharmacy Technician

## 2011-03-10 NOTE — Telephone Encounter (Signed)
Kim from [facility] called with pre-op and surgery times: RE: Pre-op appointment:  Monday, 03/14/11 at 2:45PM for surgery Friday, 03/18/11; surgery at 9:55am to follow Dr. Mort Sawyers first case.

## 2011-03-11 ENCOUNTER — Other Ambulatory Visit (HOSPITAL_COMMUNITY): Payer: Medicaid Other

## 2011-03-11 ENCOUNTER — Ambulatory Visit (HOSPITAL_COMMUNITY): Payer: Medicaid Other

## 2011-03-11 NOTE — Patient Instructions (Signed)
18 Kaylee Mitchell  03/11/2011   Your procedure is scheduled on:  03/18/11  Report to [facility] at 06:15 AM.  Call this number if you have problems the morning of surgery: [phone]   Remember:   Do not eat food:After Midnight.  May have clear liquids:until Midnight .  Clear liquids include soda, tea, black coffee, apple or grape juice, broth.  Take these medicines the morning of surgery with A SIP OF WATER: Carvedilol, Lisinopril-HCTZ, Loratadine, Sertraline and Tamoxifen. Only take half your dose of Lantus (=40 units) the night before your procedure. Also, take your Lorazepam and Hydrocodone only if needed.   Do not wear jewelry, make-up or nail polish.  Do not wear lotions, powders, or perfumes. You may wear deodorant.  Do not shave 48 hours prior to surgery.  Do not bring valuables to the hospital.  Contacts, dentures or bridgework may not be worn into surgery.  Leave suitcase in the car. After surgery it may be brought to your room.  For patients admitted to the hospital, checkout time is 11:00 AM the day of discharge.   Patients discharged the day of surgery will not be allowed to drive home.  Name and phone number of your driver:   Special Instructions: CHG Shower Use Special Wash: 1/2 bottle night before surgery and 1/2 bottle morning of surgery.   Please read over the following fact sheets that you were given: Pain Booklet, MRSA Information, Surgical Site Infection Prevention, Anesthesia Post-op Instructions and Care and Recovery After Surgery    Arthroscopic Procedure, Knee An arthroscopic procedure can find what is wrong with your knee. PROCEDURE Arthroscopy is a surgical technique that allows your orthopedic surgeon to diagnose and treat your knee injury with accuracy. They will look into your knee through a small instrument. This is almost like a small (pencil sized) telescope. Because arthroscopy affects your knee less than open knee surgery, you can anticipate a more rapid  recovery. Taking an active role by following your caregiver's instructions will help with rapid and complete recovery. Use crutches, rest, elevation, ice, and knee exercises as instructed. The length of recovery depends on various factors including type of injury, age, physical condition, medical conditions, and your rehabilitation. Your knee is the joint between the large bones (femur and tibia) in your leg. Cartilage covers these bone ends which are smooth and slippery and allow your knee to bend and move smoothly. Two menisci, thick, semi-lunar shaped pads of cartilage which form a rim inside the joint, help absorb shock and stabilize your knee. Ligaments bind the bones together and support your knee joint. Muscles move the joint, help support your knee, and take stress off the joint itself. Because of this all programs and physical therapy to rehabilitate an injured or repaired knee require rebuilding and strengthening your muscles. AFTER THE PROCEDURE  After the procedure, you will be moved to a recovery area until most of the effects of the medication have worn off. Your caregiver will discuss the test results with you.   Only take over-the-counter or prescription medicines for pain, discomfort, or fever as directed by your caregiver.  SEEK MEDICAL CARE IF:   You have increased bleeding from your wounds.   You see redness, swelling, or have increasing pain in your wounds.   You have pus coming from your wound.   You have an oral temperature above 102 F (38.9 C).   You notice a bad smell coming from the wound or dressing.  You have severe pain with any motion of your knee.  SEEK IMMEDIATE MEDICAL CARE IF:   You develop a rash.   You have difficulty breathing.   You have any allergic problems.  Document Released: 01/01/2000 Document Revised: 09/15/2010 Document Reviewed: 07/25/2007 [facility] Patient Information 2012 [place], [place].   PATIENT  INSTRUCTIONS POST-ANESTHESIA  IMMEDIATELY FOLLOWING SURGERY:  Do not drive or operate machinery for the first twenty four hours after surgery.  Do not make any important decisions for twenty four hours after surgery or while taking narcotic pain medications or sedatives.  If you develop intractable nausea and vomiting or a severe headache please notify your doctor immediately.  FOLLOW-UP:  Please make an appointment with your surgeon as instructed. You do not need to follow up with anesthesia unless specifically instructed to do so.  WOUND CARE INSTRUCTIONS (if applicable):  Keep a dry clean dressing on the anesthesia/puncture wound site if there is drainage.  Once the wound has quit draining you may leave it open to air.  Generally you should leave the bandage intact for twenty four hours unless there is drainage.  If the epidural site drains for more than 36-48 hours please call the anesthesia department.  QUESTIONS?:  Please feel free to call your physician or the hospital operator if you have any questions, and they will be happy to assist you.     [facility] Anesthesia Department [address] [place] [phone]

## 2011-03-14 ENCOUNTER — Other Ambulatory Visit: Payer: Self-pay

## 2011-03-14 ENCOUNTER — Encounter (HOSPITAL_COMMUNITY)
Admission: RE | Admit: 2011-03-14 | Discharge: 2011-03-14 | Disposition: A | Discharge status: Home / Self Care | Payer: Medicaid Other | Source: Ambulatory Visit | Attending: Orthopedic Surgery | Admitting: Orthopedic Surgery

## 2011-03-14 ENCOUNTER — Encounter (HOSPITAL_COMMUNITY): Payer: Self-pay

## 2011-03-14 ENCOUNTER — Telehealth: Payer: Self-pay | Admitting: Orthopedic Surgery

## 2011-03-14 HISTORY — DX: Major depressive disorder, single episode, unspecified: F32.9

## 2011-03-14 LAB — HEMOGLOBIN AND HEMATOCRIT, BLOOD: HCT: 37.7 % (ref 36.0–46.0)

## 2011-03-14 LAB — BASIC METABOLIC PANEL
Calcium: 9.9 mg/dL (ref 8.4–10.5)
GFR calc Af Amer: 64 mL/min — ABNORMAL LOW (ref >90)
GFR calc non Af Amer: 55 mL/min — ABNORMAL LOW (ref >90)
Potassium: 4.1 mEq/L (ref 3.5–5.1)
Sodium: 139 mEq/L (ref 135–145)

## 2011-03-14 LAB — SURGICAL PCR SCREEN
MRSA, PCR: NEGATIVE
Staphylococcus aureus: NEGATIVE

## 2011-03-14 MED ORDER — SODIUM CHLORIDE 0.9 % IV SOLN: INTRAVENOUS | Status: DC

## 2011-03-14 MED ORDER — CHLORHEXIDINE GLUCONATE 4 % EX LIQD
60.0000 mL | Freq: Once | CUTANEOUS | Status: DC
  Filled 2011-03-14: qty 60

## 2011-03-14 NOTE — Telephone Encounter (Addendum)
Contacted Medicaid's 3rd party pre-authorization contact, [employer], ph# [phone], re: out-patient surgery scheduled 03/18/11 at [facility], CPT [phone], [phone]. Per automated voice response system, no pre-authorization required.

## 2011-03-15 ENCOUNTER — Encounter (HOSPITAL_COMMUNITY): Payer: Medicare Other

## 2011-03-15 ENCOUNTER — Encounter (HOSPITAL_COMMUNITY): Payer: Medicare Other | Attending: Oncology

## 2011-03-15 DIAGNOSIS — C7951 Secondary malignant neoplasm of bone: Secondary | ICD-10-CM | POA: Insufficient documentation

## 2011-03-15 DIAGNOSIS — C50919 Malignant neoplasm of unspecified site of unspecified female breast: Secondary | ICD-10-CM | POA: Insufficient documentation

## 2011-03-15 DIAGNOSIS — C801 Malignant (primary) neoplasm, unspecified: Secondary | ICD-10-CM | POA: Insufficient documentation

## 2011-03-15 DIAGNOSIS — C7952 Secondary malignant neoplasm of bone marrow: Secondary | ICD-10-CM

## 2011-03-15 DIAGNOSIS — C50219 Malignant neoplasm of upper-inner quadrant of unspecified female breast: Secondary | ICD-10-CM

## 2011-03-15 MED ORDER — SODIUM CHLORIDE 0.9 % IV SOLN
60.0000 mg | Freq: Once | INTRAVENOUS | Status: AC
  Administered 2011-03-15: 60 mg via INTRAVENOUS
  Filled 2011-03-15: qty 500

## 2011-03-15 MED ORDER — SODIUM CHLORIDE 0.9 % IJ SOLN
INTRAMUSCULAR | Status: AC
  Administered 2011-03-15: 10 mL
  Filled 2011-03-15: qty 10

## 2011-03-15 MED ORDER — SERTRALINE HCL 50 MG PO TABS: 75.0000 mg | ORAL_TABLET | Freq: Every day | ORAL | Status: DC

## 2011-03-15 MED ORDER — SODIUM CHLORIDE 0.9 % IJ SOLN
10.0000 mL | INTRAMUSCULAR | Status: DC | PRN
  Administered 2011-03-15: 10 mL
  Filled 2011-03-15: qty 10

## 2011-03-15 MED ORDER — SODIUM CHLORIDE 0.9 % IV SOLN
Freq: Once | INTRAVENOUS | Status: AC
  Administered 2011-03-15: 10:00:00 via INTRAVENOUS

## 2011-03-15 NOTE — Telephone Encounter (Signed)
PATIENT IS AWARE 

## 2011-03-15 NOTE — Progress Notes (Signed)
Tolerated pamidronate well. 

## 2011-03-17 NOTE — H&P (Signed)
Kaylee Mitchell is an 56 y.o. female.   Chief complaint my LEFT leg is giving me a lot of trouble. The patient complains of pain in the LEFT knee and the LEFT hip. She says it started a while ago and now hurts all the time. She is burning throbbing pain over the medial joint line which last week caused her to require a walker to ambulate. Her pain is 9/10. It seems to be worse in the morning worse at night. She has not found anything that can make it better. She complains of some locking from the hip area.  Review of systems are negative except for some depression she does have a history of cancer  Past Medical History  Diagnosis Date  . Hypertension   . High cholesterol   . Knee pain, bilateral 2013  . Cancer     rt breast is primary  . Bone metastasis 09/13/2010  . Diabetes mellitus     type II  . Depression     Past Surgical History  Procedure Date  . Knee surgery     right-arthroscopy  . Mastectomy     right side  . Abdominal hysterectomy     Family History  Problem Relation Age of Onset  . Arthritis    . Cancer    . Diabetes    . Anesthesia problems Neg Hx   . Hypotension Neg Hx   . Malignant hyperthermia Neg Hx   . Pseudochol deficiency Neg Hx    Social History:  reports that she has never smoked. She does not have any smokeless tobacco history on file. She reports that she does not drink alcohol or use illicit drugs.  Allergies: No Known Allergies  Medications Prior to Admission  Medication Dose Route Frequency Provider Last Rate Last Dose  . methylPREDNISolone acetate (DEPO-MEDROL) injection 40 mg  40 mg Intra-articular Once Fuller Canada, MD      . sodium chloride 0.9 % injection 10 mL  10 mL Intracatheter PRN Dellis Anes, PA   10 mL at 12/08/10 0853   Medications Prior to Admission  Medication Sig Dispense Refill  . aspirin EC 81 MG tablet Take 81 mg by mouth daily.        Marland Kitchen atorvastatin (LIPITOR) 20 MG tablet Take 20 mg by mouth daily.        .  calcium-vitamin D (OSCAL WITH D) 500-200 MG-UNIT per tablet Take 1 tablet by mouth daily.        . carvedilol (COREG) 25 MG tablet Take 25 mg by mouth daily.        . Hydrocodone-Acetaminophen 7.5-300 MG TABS Take 1 tablet by mouth every 4 (four) hours as needed. For pain      . ibuprofen (ADVIL,MOTRIN) 200 MG tablet Take 200 mg by mouth every 6 (six) hours as needed. For pain      . insulin glargine (LANTUS SOLOSTAR) 100 UNIT/ML injection Inject 80 Units into the skin at bedtime.       Marland Kitchen lisinopril-hydrochlorothiazide (PRINZIDE,ZESTORETIC) 20-25 MG per tablet Take 1 tablet by mouth daily.        Marland Kitchen loratadine (CLARITIN) 10 MG tablet Take 10 mg by mouth daily as needed. For allergies      . LORazepam (ATIVAN) 0.5 MG tablet Take 0.5 mg by mouth every 8 (eight) hours as needed. For nausea      . metFORMIN (GLUCOPHAGE) 1000 MG tablet Take 1,000 mg by mouth 2 (two) times daily with a meal.  No results found for this or any previous visit (from the past 48 hour(s)). No results found.  ROS As above There were no vitals taken for this visit. Physical Exam  Physical Exam(12)  GENERAL: normal development  CDV: pulses are normal  Skin: normal  Lymph: nodes were not palpable/normal  Psychiatric: awake, alert and oriented  Neuro: normal sensation  MSK Today she is ambulating without any assistive devices area  1The LEFT knee is tender over the medial bursa  2 Knee flexion is normal  3 Knee stability is normal  4 Strength and muscle tone are normal  5 Skin is normal   Right knee full range of motion. Stability normal muscle tone normal skin normal no tenderness no swelling.  Upper extremity exam  Inspection and palpation revealed no abnormalities in the upper extremities.  Range of motion is full without contracture.  Motor exam is normal with grade 5 strength.  The joints are fully reduced without subluxation.  There is no atrophy or tremor and muscle tone is normal.  All joints  are stable.      X-rays were done already and it did not show any acute processes.     The left knee is tender over the medial joint line and the patient has mechanical symptoms. Shows small effusion with stable ligaments. Manual muscle testing normal strength in the quads and hamstrings. Collateral ligaments and cruciate ligaments stable. McMurray sign remains +.  The patient could not have an MRI because she couldn't get approval. However, she has severe pain can't walk the leg is giving way and she is agreeable to have surgery to evaluate the knee arthroscopically  Assessment/Plan Torn medial meniscus left knee with a differential diagnosis of radicular leg pain  Arthroscopy left knee plan partial medial meniscectomy as indicated.  Fuller Canada 03/17/2011, 1:25 PM

## 2011-03-18 ENCOUNTER — Encounter (HOSPITAL_COMMUNITY): Payer: Self-pay | Admitting: Anesthesiology

## 2011-03-18 ENCOUNTER — Ambulatory Visit (HOSPITAL_COMMUNITY): Payer: Medicaid Other | Admitting: Anesthesiology

## 2011-03-18 ENCOUNTER — Encounter (HOSPITAL_COMMUNITY): Payer: Self-pay | Admitting: *Deleted

## 2011-03-18 ENCOUNTER — Encounter (HOSPITAL_COMMUNITY)
Admission: RE | Disposition: A | Discharge status: Home / Self Care | Payer: Self-pay | Source: Ambulatory Visit | Attending: Orthopedic Surgery

## 2011-03-18 ENCOUNTER — Ambulatory Visit (HOSPITAL_COMMUNITY)
Admission: RE | Admit: 2011-03-18 | Discharge: 2011-03-18 | Disposition: A | Discharge status: Home / Self Care | Payer: Medicaid Other | Source: Ambulatory Visit | Attending: Orthopedic Surgery | Admitting: Orthopedic Surgery

## 2011-03-18 DIAGNOSIS — M23305 Other meniscus derangements, unspecified medial meniscus, unspecified knee: Secondary | ICD-10-CM | POA: Insufficient documentation

## 2011-03-18 DIAGNOSIS — E119 Type 2 diabetes mellitus without complications: Secondary | ICD-10-CM | POA: Insufficient documentation

## 2011-03-18 DIAGNOSIS — I10 Essential (primary) hypertension: Secondary | ICD-10-CM | POA: Insufficient documentation

## 2011-03-18 DIAGNOSIS — Z01810 Encounter for preprocedural cardiovascular examination: Secondary | ICD-10-CM | POA: Insufficient documentation

## 2011-03-18 DIAGNOSIS — M23329 Other meniscus derangements, posterior horn of medial meniscus, unspecified knee: Secondary | ICD-10-CM

## 2011-03-18 DIAGNOSIS — Z01812 Encounter for preprocedural laboratory examination: Secondary | ICD-10-CM | POA: Insufficient documentation

## 2011-03-18 DIAGNOSIS — Z79899 Other long term (current) drug therapy: Secondary | ICD-10-CM | POA: Insufficient documentation

## 2011-03-18 SURGERY — ARTHROSCOPY, KNEE, WITH MEDIAL MENISCECTOMY
Anesthesia: General | Site: Knee | Laterality: Left | Wound class: Clean

## 2011-03-18 MED ORDER — CELECOXIB 100 MG PO CAPS
400.0000 mg | ORAL_CAPSULE | Freq: Once | ORAL | Status: AC
  Administered 2011-03-18: 400 mg via ORAL

## 2011-03-18 MED ORDER — LACTATED RINGERS IV SOLN: INTRAVENOUS | Status: DC

## 2011-03-18 MED ORDER — PROPOFOL 10 MG/ML IV EMUL
INTRAVENOUS | Status: AC
  Filled 2011-03-18: qty 20

## 2011-03-18 MED ORDER — ONDANSETRON HCL 4 MG/2ML IJ SOLN
INTRAMUSCULAR | Status: AC
  Administered 2011-03-18: 4 mg via INTRAVENOUS
  Filled 2011-03-18: qty 2

## 2011-03-18 MED ORDER — CEFAZOLIN SODIUM-DEXTROSE 2-3 GM-% IV SOLR: 2.0000 g | INTRAVENOUS | Status: DC

## 2011-03-18 MED ORDER — FENTANYL CITRATE 0.05 MG/ML IJ SOLN
INTRAMUSCULAR | Status: AC
  Filled 2011-03-18: qty 5

## 2011-03-18 MED ORDER — BUPIVACAINE-EPINEPHRINE PF 0.5-1:200000 % IJ SOLN
INTRAMUSCULAR | Status: DC | PRN
  Administered 2011-03-18: 60 mL

## 2011-03-18 MED ORDER — GABAPENTIN 100 MG PO CAPS: 100.0000 mg | ORAL_CAPSULE | Freq: Three times a day (TID) | ORAL | Status: DC

## 2011-03-18 MED ORDER — CEFAZOLIN SODIUM-DEXTROSE 2-3 GM-% IV SOLR
INTRAVENOUS | Status: AC
  Filled 2011-03-18: qty 50

## 2011-03-18 MED ORDER — LIDOCAINE HCL (CARDIAC) 10 MG/ML IV SOLN
INTRAVENOUS | Status: DC | PRN
  Administered 2011-03-18: 50 mg via INTRAVENOUS

## 2011-03-18 MED ORDER — PROPOFOL 10 MG/ML IV EMUL
INTRAVENOUS | Status: DC | PRN
  Administered 2011-03-18: 150 mg via INTRAVENOUS

## 2011-03-18 MED ORDER — BUPIVACAINE-EPINEPHRINE PF 0.5-1:200000 % IJ SOLN
INTRAMUSCULAR | Status: AC
  Filled 2011-03-18: qty 10

## 2011-03-18 MED ORDER — LIDOCAINE HCL (PF) 1 % IJ SOLN
INTRAMUSCULAR | Status: AC
  Filled 2011-03-18: qty 5

## 2011-03-18 MED ORDER — GLYCOPYRROLATE 0.2 MG/ML IJ SOLN
0.2000 mg | Freq: Once | INTRAMUSCULAR | Status: AC
  Administered 2011-03-18: 0.2 mg via INTRAVENOUS

## 2011-03-18 MED ORDER — MIDAZOLAM HCL 2 MG/2ML IJ SOLN
1.0000 mg | INTRAMUSCULAR | Status: DC | PRN
  Administered 2011-03-18: 2 mg via INTRAVENOUS

## 2011-03-18 MED ORDER — GLYCOPYRROLATE 0.2 MG/ML IJ SOLN
INTRAMUSCULAR | Status: AC
  Administered 2011-03-18: 0.2 mg via INTRAVENOUS
  Filled 2011-03-18: qty 1

## 2011-03-18 MED ORDER — ONDANSETRON HCL 4 MG/2ML IJ SOLN
4.0000 mg | Freq: Once | INTRAMUSCULAR | Status: AC
  Administered 2011-03-18: 4 mg via INTRAVENOUS

## 2011-03-18 MED ORDER — ENOXAPARIN SODIUM 40 MG/0.4ML ~~LOC~~ SOLN
SUBCUTANEOUS | Status: AC
  Administered 2011-03-18: 40 mg via SUBCUTANEOUS
  Filled 2011-03-18: qty 0.4

## 2011-03-18 MED ORDER — OXYCODONE HCL 5 MG PO TABS
5.0000 mg | ORAL_TABLET | ORAL | Status: DC
  Administered 2011-03-18: 5 mg via ORAL

## 2011-03-18 MED ORDER — SODIUM CHLORIDE 0.9 % IR SOLN
Status: DC | PRN
  Administered 2011-03-18: 08:00:00

## 2011-03-18 MED ORDER — ONDANSETRON HCL 4 MG/2ML IJ SOLN
4.0000 mg | Freq: Once | INTRAMUSCULAR | Status: AC | PRN
  Administered 2011-03-18: 4 mg via INTRAVENOUS

## 2011-03-18 MED ORDER — PREGABALIN 50 MG PO CAPS
ORAL_CAPSULE | ORAL | Status: AC
  Administered 2011-03-18: 50 mg via ORAL
  Filled 2011-03-18: qty 1

## 2011-03-18 MED ORDER — OXYCODONE HCL 5 MG PO TABS
ORAL_TABLET | ORAL | Status: AC
  Administered 2011-03-18: 5 mg via ORAL
  Filled 2011-03-18: qty 1

## 2011-03-18 MED ORDER — MIDAZOLAM HCL 2 MG/2ML IJ SOLN
INTRAMUSCULAR | Status: AC
  Administered 2011-03-18: 2 mg via INTRAVENOUS
  Filled 2011-03-18: qty 2

## 2011-03-18 MED ORDER — BUPIVACAINE HCL (PF) 0.5 % IJ SOLN
INTRAMUSCULAR | Status: AC
  Filled 2011-03-18: qty 30

## 2011-03-18 MED ORDER — LACTATED RINGERS IV SOLN
INTRAVENOUS | Status: DC
  Administered 2011-03-18: 1000 mL via INTRAVENOUS

## 2011-03-18 MED ORDER — LACTATED RINGERS IV SOLN
INTRAVENOUS | Status: DC | PRN
  Administered 2011-03-18 (×2): via INTRAVENOUS

## 2011-03-18 MED ORDER — PREDNISONE 10 MG PO KIT: 10.0000 mg | PACK | ORAL | Status: DC

## 2011-03-18 MED ORDER — EPINEPHRINE HCL 1 MG/ML IJ SOLN
INTRAMUSCULAR | Status: AC
  Filled 2011-03-18: qty 6

## 2011-03-18 MED ORDER — CELECOXIB 100 MG PO CAPS
ORAL_CAPSULE | ORAL | Status: AC
  Administered 2011-03-18: 400 mg via ORAL
  Filled 2011-03-18: qty 4

## 2011-03-18 MED ORDER — ACETAMINOPHEN 10 MG/ML IV SOLN
INTRAVENOUS | Status: AC
  Administered 2011-03-18: 1000 mg via INTRAVENOUS
  Filled 2011-03-18: qty 100

## 2011-03-18 MED ORDER — OXYCODONE-ACETAMINOPHEN 5-325 MG PO TABS: 1.0000 | ORAL_TABLET | ORAL | Status: AC | PRN

## 2011-03-18 MED ORDER — ENOXAPARIN SODIUM 40 MG/0.4ML ~~LOC~~ SOLN
40.0000 mg | Freq: Once | SUBCUTANEOUS | Status: AC
  Administered 2011-03-18: 40 mg via SUBCUTANEOUS

## 2011-03-18 MED ORDER — EPHEDRINE SULFATE 50 MG/ML IJ SOLN
INTRAMUSCULAR | Status: DC | PRN
  Administered 2011-03-18 (×2): 10 mg via INTRAVENOUS

## 2011-03-18 MED ORDER — CEFAZOLIN SODIUM 1-5 GM-% IV SOLN
INTRAVENOUS | Status: DC | PRN
  Administered 2011-03-18: 2 g via INTRAVENOUS

## 2011-03-18 MED ORDER — SODIUM CHLORIDE 0.9 % IR SOLN
Status: DC | PRN
  Administered 2011-03-18: 1000 mL

## 2011-03-18 MED ORDER — FENTANYL CITRATE 0.05 MG/ML IJ SOLN
INTRAMUSCULAR | Status: DC | PRN
  Administered 2011-03-18: 100 ug via INTRAVENOUS

## 2011-03-18 MED ORDER — SUCCINYLCHOLINE CHLORIDE 20 MG/ML IJ SOLN
INTRAMUSCULAR | Status: AC
  Filled 2011-03-18: qty 1

## 2011-03-18 MED ORDER — ACETAMINOPHEN 325 MG PO TABS: 325.0000 mg | ORAL_TABLET | ORAL | Status: DC | PRN

## 2011-03-18 MED ORDER — PREGABALIN 50 MG PO CAPS
50.0000 mg | ORAL_CAPSULE | Freq: Once | ORAL | Status: AC
  Administered 2011-03-18: 50 mg via ORAL

## 2011-03-18 MED ORDER — ACETAMINOPHEN 10 MG/ML IV SOLN
1000.0000 mg | Freq: Once | INTRAVENOUS | Status: AC
  Administered 2011-03-18: 1000 mg via INTRAVENOUS

## 2011-03-18 MED ORDER — EPHEDRINE SULFATE 50 MG/ML IJ SOLN
INTRAMUSCULAR | Status: AC
  Filled 2011-03-18: qty 1

## 2011-03-18 MED ORDER — FENTANYL CITRATE 0.05 MG/ML IJ SOLN: 25.0000 ug | INTRAMUSCULAR | Status: DC | PRN

## 2011-03-18 SURGICAL SUPPLY — 51 items
ARTHROWAND PARAGON T2 (SURGICAL WAND)
BAG HAMPER (MISCELLANEOUS) ×2 IMPLANT
BANDAGE ELASTIC 6 VELCRO NS (GAUZE/BANDAGES/DRESSINGS) ×2 IMPLANT
BLADE AGGRESSIVE PLUS 4.0 (BLADE) ×4 IMPLANT
BLADE SURG SZ11 CARB STEEL (BLADE) ×4 IMPLANT
CHLORAPREP W/TINT 26ML (MISCELLANEOUS) ×4 IMPLANT
CLOTH BEACON ORANGE TIMEOUT ST (SAFETY) ×4 IMPLANT
COOLER CRYO IC GRAV AND TUBE (ORTHOPEDIC SUPPLIES) ×2 IMPLANT
CUFF CRYO KNEE LG 20X31 COOLER (ORTHOPEDIC SUPPLIES) ×2 IMPLANT
CUFF CRYO KNEE18X23 MED (MISCELLANEOUS) IMPLANT
CUFF TOURNIQUET SINGLE 34IN LL (TOURNIQUET CUFF) IMPLANT
CUFF TOURNIQUET SINGLE 44IN (TOURNIQUET CUFF) IMPLANT
CUTTER ANGLED DBL BITE 4.5 (BURR) IMPLANT
DECANTER SPIKE VIAL GLASS SM (MISCELLANEOUS) ×4 IMPLANT
FLOOR PAD 36X40 (MISCELLANEOUS)
GAUZE SPONGE 4X4 16PLY XRAY LF (GAUZE/BANDAGES/DRESSINGS) ×2 IMPLANT
GAUZE XEROFORM 5X9 LF (GAUZE/BANDAGES/DRESSINGS) ×2 IMPLANT
GLOVE ECLIPSE 6.5 STRL STRAW (GLOVE) ×8 IMPLANT
GLOVE INDICATOR 7.0 STRL GRN (GLOVE) ×6 IMPLANT
GLOVE SKINSENSE NS SZ8.0 LF (GLOVE) ×2
GLOVE SKINSENSE STRL SZ8.0 LF (GLOVE) ×2 IMPLANT
GLOVE SS N UNI LF 8.5 STRL (GLOVE) ×4 IMPLANT
GOWN STRL REIN XL XLG (GOWN DISPOSABLE) ×10 IMPLANT
HLDR LEG FOAM (MISCELLANEOUS) ×1 IMPLANT
IV NS IRRIG 3000ML ARTHROMATIC (IV SOLUTION) ×6 IMPLANT
KIT BLADEGUARD II DBL (SET/KITS/TRAYS/PACK) ×4 IMPLANT
KIT ROOM TURNOVER AP CYSTO (KITS) ×2 IMPLANT
LEG HOLDER FOAM (MISCELLANEOUS) ×1
MANIFOLD NEPTUNE II (INSTRUMENTS) ×2 IMPLANT
MARKER SKIN DUAL TIP RULER LAB (MISCELLANEOUS) ×4 IMPLANT
NEEDLE HYPO 18GX1.5 BLUNT FILL (NEEDLE) ×4 IMPLANT
NEEDLE HYPO 21X1.5 SAFETY (NEEDLE) ×4 IMPLANT
NEEDLE SPNL 18GX3.5 QUINCKE PK (NEEDLE) ×4 IMPLANT
NS IRRIG 1000ML POUR BTL (IV SOLUTION) ×2 IMPLANT
PACK ARTHRO LIMB DRAPE STRL (MISCELLANEOUS) ×4 IMPLANT
PAD ABD 5X9 TENDERSORB (GAUZE/BANDAGES/DRESSINGS) ×2 IMPLANT
PAD ARMBOARD 7.5X6 YLW CONV (MISCELLANEOUS) ×2 IMPLANT
PAD FLOOR 36X40 (MISCELLANEOUS) IMPLANT
PADDING CAST COTTON 6X4 STRL (CAST SUPPLIES) ×2 IMPLANT
SET ARTHROSCOPY INST (INSTRUMENTS) ×4 IMPLANT
SET ARTHROSCOPY PUMP TUBE (IRRIGATION / IRRIGATOR) ×4 IMPLANT
SET BASIN LINEN APH (SET/KITS/TRAYS/PACK) ×4 IMPLANT
SPONGE GAUZE 4X4 12PLY (GAUZE/BANDAGES/DRESSINGS) ×2 IMPLANT
STRIP CLOSURE SKIN 1/2X4 (GAUZE/BANDAGES/DRESSINGS) ×2 IMPLANT
SUT ETHILON 3 0 FSL (SUTURE) IMPLANT
SYR 30ML LL (SYRINGE) ×4 IMPLANT
SYRINGE 10CC LL (SYRINGE) ×2 IMPLANT
WAND 50 DEG COVAC W/CORD (SURGICAL WAND) IMPLANT
WAND 90 DEG TURBOVAC W/CORD (SURGICAL WAND) IMPLANT
WAND ARTHRO PARAGON T2 (SURGICAL WAND) IMPLANT
YANKAUER SUCT BULB TIP 10FT TU (MISCELLANEOUS) ×12 IMPLANT

## 2011-03-18 NOTE — Anesthesia Postprocedure Evaluation (Signed)
  Anesthesia Post-op Note  Patient: Kaylee Mitchell  Procedure(s) Performed: Procedure(s) (LRB): KNEE ARTHROSCOPY WITH MEDIAL MENISECTOMY (Left)  Patient Location: PACU  Anesthesia Type: General  Level of Consciousness: awake, alert , oriented and patient cooperative  Airway and Oxygen Therapy: Patient Spontanous Breathing and Patient connected to face mask oxygen  Post-op Pain: none  Post-op Assessment: Post-op Vital signs reviewed, Patient's Cardiovascular Status Stable, Respiratory Function Stable, Patent Airway and No signs of Nausea or vomiting  Post-op Vital Signs: Reviewed and stable  Complications: No apparent anesthesia complications

## 2011-03-18 NOTE — Anesthesia Procedure Notes (Signed)
Procedure Name: Intubation Date/Time: 03/18/2011 7:54 AM Performed by: Carolyne Littles, Legion Discher Pre-anesthesia Checklist: Patient identified, Emergency Drugs available, Suction available, Patient being monitored and Timeout performed Patient Re-evaluated:Patient Re-evaluated prior to inductionOxygen Delivery Method: Circle system utilized Preoxygenation: Pre-oxygenation with 100% oxygen Intubation Type: IV induction Ventilation: Mask ventilation without difficulty Laryngoscope Size: Miller and 3 Grade View: Grade I Tube type: Oral Number of attempts: 1 Placement Confirmation: ETT inserted through vocal cords under direct vision,  positive ETCO2 and breath sounds checked- equal and bilateral Secured at: 22 cm Tube secured with: Tape Dental Injury: Teeth and Oropharynx as per pre-operative assessment

## 2011-03-18 NOTE — Preoperative (Signed)
Beta Blockers   Reason not to administer Beta Blockers:Not Applicable 

## 2011-03-18 NOTE — Transfer of Care (Signed)
Immediate Anesthesia Transfer of Care Note  Patient: Kaylee Mitchell  Procedure(s) Performed: Procedure(s) (LRB): KNEE ARTHROSCOPY WITH MEDIAL MENISECTOMY (Left)  Patient Location: PACU  Anesthesia Type: General  Level of Consciousness: awake, alert  and oriented  Airway & Oxygen Therapy: Patient Spontanous Breathing and Patient connected to face mask oxygen  Post-op Assessment: Report given to PACU RN and Post -op Vital signs reviewed and stable  Post vital signs: Reviewed and stable  Complications: No apparent anesthesia complications

## 2011-03-18 NOTE — Anesthesia Preprocedure Evaluation (Signed)
Anesthesia Evaluation  Patient identified by MRN, date of birth, ID band Patient awake    Reviewed: Allergy & Precautions, H&P , NPO status , Patient's Chart, lab work & pertinent test results, reviewed documented beta blocker date and time   Airway Mallampati: I TM Distance: >3 FB Neck ROM: Full    Dental  (+) Poor Dentition and Missing   Pulmonary    Pulmonary exam normal       Cardiovascular hypertension, Pt. on medications and Pt. on home beta blockers Regular Normal    Neuro/Psych PSYCHIATRIC DISORDERS Depression    GI/Hepatic negative GI ROS, Neg liver ROS,   Endo/Other  Diabetes mellitus-, Well Controlled, Type 2, Insulin DependentMorbid obesity  Renal/GU negative Renal ROS     Musculoskeletal   Abdominal (+) obese,  Abdomen: soft.    Peds  Hematology   Anesthesia Other Findings   Reproductive/Obstetrics                           Anesthesia Physical Anesthesia Plan  ASA: III  Anesthesia Plan: General   Post-op Pain Management:    Induction: Intravenous  Airway Management Planned: Oral ETT  Additional Equipment:   Intra-op Plan:   Post-operative Plan: Extubation in OR  Informed Consent: I have reviewed the patients History and Physical, chart, labs and discussed the procedure including the risks, benefits and alternatives for the proposed anesthesia with the patient or authorized representative who has indicated his/her understanding and acceptance.   Dental advisory given  Plan Discussed with: CRNA  Anesthesia Plan Comments:         Anesthesia Quick Evaluation

## 2011-03-18 NOTE — Brief Op Note (Signed)
03/18/2011  8:44 AM  PATIENT:  Kaylee Mitchell  56 y.o. female  PRE-OPERATIVE DIAGNOSIS:  Left knee medial meniscus tear  POST-OPERATIVE DIAGNOSIS:  Left knee medial meniscus tear, medial plica, patellofemoral arthritis  PROCEDURE:  Procedure(s) (LRB): KNEE ARTHROSCOPY WITH MEDIAL MENISECTOMY (Left), synovial plica excision  SURGEON:  Surgeon(s) and Role:    * Fuller Canada, MD - Primary  PHYSICIAN ASSISTANT:   ASSISTANTS: none   ANESTHESIA:   general  EBL:  Total I/O In: 1200 [I.V.:1200] Out: 0   BLOOD ADMINISTERED:none  DRAINS: none   LOCAL MEDICATIONS USED:  MARCAINE with epi 60 cc     SPECIMEN:  No Specimen  DISPOSITION OF SPECIMEN:  N/A  COUNTS:  YES  TOURNIQUET:  * Missing tourniquet times found for documented tourniquets in log:  13244 *  DICTATION: .Dragon Dictation  PLAN OF CARE: Discharge to home after PACU  PATIENT DISPOSITION:  PACU - hemodynamically stable.   Delay start of Pharmacological VTE agent (>24hrs) due to surgical blood loss or risk of bleeding: no

## 2011-03-18 NOTE — Op Note (Signed)
Preop diagnosis torn medial meniscus left knee  Postop diagnosis torn medial meniscus left knee Medial synovial plica Patellofemoral arthritis  Procedure arthroscopy left knee partial medial meniscectomy, removal of synovial plica  Findings midbody tear and degeneration medial meniscus. Grade 3 lesion trochlea. Large medial synovial plica. Anterior cruciate ligament intact PCL intact lateral compartment intact medial compartment mild grade 1 arthritis medial femoral condyle mild grade 1 arthritis lateral tibial plateau  Surgeon Romeo Apple  No assistants  General anesthesia  The patient was identified in the preop holding area the left knee was marked as a surgical site. The chart was reviewed and updated.  The patient was taken to the operating room she was given 2 g of Ancef. Her left leg was placed in an arthroscopic leg holder.  Timeout procedure was completed.  The knee was injected with Marcaine and epinephrine solution in the lateral portal. A lateral portal was made. Scope was placed in the joint. Diagnostic arthroscopy was completed. Medial portal was made. Collene Mares was placed in the medial compartment to remove synovial tissue for visualization. Probe is in place 2 probe the articular structures. Midbody medial meniscal degeneration and tearing was noted. This was treated with the arthroscopic shaver. Medial plica was resected in the same manner.  The knee was irrigated and closed with 3-0 nylon sutures and injected with 60 cc of Marcaine with epinephrine  Sterile bandage was applied Cryo/Cuff was placed and activated  Postop plan patient is weightbearing as tolerated  Based on the patient's complaints an arthroscopic findings the patient will be worked up for lumbar disc disease.  She is also getting a dose of Lovenox prior to discharge because of her weight.

## 2011-03-18 NOTE — Interval H&P Note (Signed)
History and Physical Interval Note:  03/18/2011 7:32 AM  Kaylee Mitchell  has presented today for surgery, with the diagnosis of Left knee medial meniscus tear  The various methods of treatment have been discussed with the patient and family. After consideration of risks, benefits and other options for treatment, the patient has consented to  Procedure(s) (LRB): LEFT KNEE ARTHROSCOPY WITH MEDIAL MENISECTOMY (Left) as a surgical intervention .  The patients' history has been reviewed, patient examined, no change in status, stable for surgery.  I have reviewed the patients' chart and labs.  Questions were answered to the patient's satisfaction.     Fuller Canada

## 2011-03-21 ENCOUNTER — Encounter: Payer: Self-pay | Admitting: Orthopedic Surgery

## 2011-03-21 ENCOUNTER — Ambulatory Visit (INDEPENDENT_AMBULATORY_CARE_PROVIDER_SITE_OTHER): Payer: Medicaid Other | Admitting: Orthopedic Surgery

## 2011-03-21 VITALS — Ht 65.0 in | Wt 308.0 lb

## 2011-03-21 DIAGNOSIS — Z9889 Other specified postprocedural states: Secondary | ICD-10-CM | POA: Insufficient documentation

## 2011-03-21 MED ORDER — SODIUM CHLORIDE 0.9 % IR SOLN
Status: AC | PRN
  Administered 2011-03-18: 08:00:00

## 2011-03-21 NOTE — Patient Instructions (Signed)
Continue same medications  Start physical therapy this week  Return 3 weeks  Weight-bear as tolerated.  Crutches can be stopped as soon as you are  comfortable.

## 2011-03-21 NOTE — Progress Notes (Signed)
Patient ID: Kaylee Mitchell, female   DOB: 1955/10/08, 56 y.o.   MRN: [id number] Chief Complaint  Patient presents with  . Follow-up    Post op #1, left knee.   The second problem is neuropathy LEFT lower extremity  At the knee scope showed degenerative medial meniscal tear which was resected.  I placed her on steroid dosepak and Neurontin and that seems to have helped her hip pain her knee feels better she can sleep at night  Her knee looks good we'll take the stitches out she can start therapy and I will see her in 3 weeks

## 2011-03-25 ENCOUNTER — Ambulatory Visit (HOSPITAL_COMMUNITY)
Admission: RE | Admit: 2011-03-25 | Discharge: 2011-03-25 | Disposition: A | Discharge status: Home / Self Care | Payer: Medicaid Other | Source: Ambulatory Visit | Attending: Orthopedic Surgery | Admitting: Orthopedic Surgery

## 2011-03-25 DIAGNOSIS — IMO0001 Reserved for inherently not codable concepts without codable children: Secondary | ICD-10-CM | POA: Insufficient documentation

## 2011-03-25 DIAGNOSIS — M6281 Muscle weakness (generalized): Secondary | ICD-10-CM | POA: Insufficient documentation

## 2011-03-25 DIAGNOSIS — R262 Difficulty in walking, not elsewhere classified: Secondary | ICD-10-CM | POA: Insufficient documentation

## 2011-03-25 DIAGNOSIS — M25569 Pain in unspecified knee: Secondary | ICD-10-CM | POA: Insufficient documentation

## 2011-03-25 NOTE — Evaluation (Signed)
Physical Therapy Evaluation - MEDICAID  Patient Details  Name: Kaylee Mitchell MRN: [id number] Date of Birth: 08-09-1955  Today's Date: 03/25/2011 Time: 0930-1002 Time Calculation (min): 32 min Medicaid Authorization Time Period:    Medicaid Visit#: 0  of 3   Charges: 1 eval Past Medical History:  Past Medical History  Diagnosis Date  . Hypertension   . High cholesterol   . Knee pain, bilateral 2013  . Cancer     rt breast is primary  . Bone metastasis 09/13/2010  . Diabetes mellitus     type II  . Depression    Past Surgical History:  Past Surgical History  Procedure Date  . Knee surgery     right-arthroscopy  . Mastectomy     right side  . Abdominal hysterectomy     Subjective Symptoms/Limitations Symptoms: Pt had a left knee scope last friday.  She is currently taking prednisone and neurotine for pain.  Her pain ranges from 2-5/10.  She was icing her knee for pain.  She is currently ambulating with a RW which she has been been using for 2 months because her pain was so bad.  Before 2 months she was walking independently.   Prior to 2 months she was able to walk for about an hour.  She was able to stand for greater then an hour. PMH: breast cancer, DM II, HTN, depression, partial hysterectomy. How long can you sit comfortably?: No difficulty.  How long can you stand comfortably?: 5 minutes with her RW next to her. How long can you walk comfortably?: 2 minutes with a RW.   Pain Assessment Pain Score:   5 Pain Location: Knee Pain Orientation: Left  Prior Functioning  Prior Function Comments: enjoys sitting and completing cross word puzzels  Cognition/Observation Observation/Other Assessments Observations: Toe in especially to LLE.  Protals are healing well. mild effusion to L knee.  Sensation/Coordination/Flexibility/Functional Tests Functional Tests Functional Tests: Lower Extremity Functional Scale: 17/80  Assessment LLE AROM (degrees) Left Knee Extension  0-130: 0  Left Knee Flexion 0-140: 125  LLE Strength Left Hip Flexion: 2+/5 Left Hip Extension: 3-/5 Left Hip ABduction: 3/5 Left Hip ADduction: 2+/5 Left Knee Flexion: 3/5 Left Knee Extension: 3/5  Palpation: mild pain and tenderness to L knee joint line.   Mobility/Balance  Ambulation/Gait Ambulation/Gait: Yes Assistive device: Rolling walker Gait Pattern: Decreased hip/knee flexion - left;Antalgic;Lateral hip instability;Lateral trunk lean to left Static Standing Balance Static Standing - Comment/# of Minutes: each position held for max 10 sec Single Leg Stance - Right Leg: 3  Single Leg Stance - Left Leg: 3  Tandem Stance - Right Leg: 4  Tandem Stance - Left Leg: 10  Rhomberg - Eyes Opened: 10  Rhomberg - Eyes Closed: 10   Exercise/Treatments Standing Heel Raises: 10 reps;Limitations Heel Raises Limitations: Toe Raises 10x Knee Flexion: Left;10 reps Functional Squat: 10 reps Supine Short Arc Quad Sets: Left;10 reps Straight Leg Raises: 10 reps Sidelying Hip ABduction: Left;10 reps Hip ADduction: Left;10 reps Prone  Hamstring Curl: 10 reps Hip Extension: Left;10 reps    Physical Therapy Assessment and Plan PT Assessment and Plan Clinical Impression Statement: Pt is a 56 year old female referred to PT S/P L knee scope.  After examination it was found that she has current impairments including increased pain, increased swelling to L knee, decreased L hip and knee strength, difficulty walking, impaired balance and impaired percived fucitonal ability which is limiting her in pariticipating in her usual household activities.  Pt will benefit from skilled OP PT in order to address above impairments in order to reach functional goals.  Rehab Potential: Good PT Frequency: Min 2X/week PT Duration: 4 weeks PT Treatment/Interventions: Gait training;DME instruction;Stair training;Functional mobility training;Therapeutic activities;Therapeutic exercise;Balance  training;Neuromuscular re-education;Patient/family education;Other (comment) (Manual and modalities for pain control ) PT Plan: Medicaid patient.  Please go over HEP and make sure she is able to complete independently.  add SLS and step training, Gait training without RW (possibly independent, may need cane)    Goals Home Exercise Program Pt will Perform Home Exercise Program: Independently PT Short Term Goals Time to Complete Short Term Goals: 2 weeks PT Short Term Goal 1: Pt will report pain less than or equal to 3/10 for 50% of her day. PT Short Term Goal 2: Pt will ambulate independently for 2 minutes.  PT Short Term Goal 3: Pt will improve her LE MMT by 1/2 grade. PT Short Term Goal 4: Pt will demonstrate L SLS on static surface x10 sec PT Long Term Goals Time to Complete Long Term Goals: 4 weeks PT Long Term Goal 1: Pt will report pain less than 2/10 for 75% of her day for improved QOL PT Long Term Goal 2: Pt will improve her LEFS score by 9 points for improved percieved functional ability.  Long Term Goal 3: Pt will improve her functional strength to WNL in order to ascend and descend 5 stairs with one handrail with reciproocal pattern.   Problem List Patient Active Problem List  Diagnoses  . Breast CA  . Bone metastasis  . Bursitis of knee  . Medial meniscus, posterior horn derangement  . S/P arthroscopy of left knee  . Pain in joint, lower leg  . Difficulty in walking    PT - End of Session Activity Tolerance: Patient tolerated treatment well PT Plan of Care PT Home Exercise Plan:  see scanned report.  (4 way SLR, SAQ's, prone knee flexion, functional squats, heel/toe raises, standing knee flexion) Consulted and Agree with Plan of Care: Patient  Tyhesha Dutson 03/25/2011, 10:16 AM  Physician Documentation Your signature is required to indicate approval of the treatment plan as stated above.  Please sign and either send electronically or make a copy of this report for your  files and return this physician signed original.   Please mark one 1.__approve of plan  2. ___approve of plan with the following conditions.   ______________________________                                                          _____________________ Physician Signature                                                                                                             Date

## 2011-03-30 ENCOUNTER — Ambulatory Visit (HOSPITAL_COMMUNITY)
Admission: RE | Admit: 2011-03-30 | Discharge: 2011-03-30 | Disposition: A | Discharge status: Home / Self Care | Payer: Medicaid Other | Source: Ambulatory Visit | Attending: Physical Therapy | Admitting: Physical Therapy

## 2011-03-30 NOTE — Progress Notes (Signed)
Physical Therapy Treatment Patient Details  Name: Kaylee Mitchell MRN: [id number] Date of Birth: [birth year]  Today's Date: 03/30/2011 Time: 0908-1000 Time Calculation (min): 52 min Visit#: 1  of 3   Re-eval:    Charge: therex 34 min Ice 10 min  Subjective: Symptoms/Limitations Symptoms: Pt stated left knee feeling good today, pt entered the dept with no AD.  Stated she has had no falls with no AD. Pain Assessment Currently in Pain?: No/denies  Objective:   Exercise/Treatments Aerobic Stationary Bike: 8' @ 2.0 Standing Heel Raises: 10 reps;Limitations Heel Raises Limitations: Toe Raises 10x Knee Flexion: Left;10 reps;Limitations Knee Flexion Limitations: 3# Lateral Step Up: Left;10 reps;Hand Hold: 2;Step Height: 4" Forward Step Up: Left;10 reps Functional Squat: 10 reps SLS: 30" with 1 HHA, R 7", L 10" Seated Long Arc Quad: 10 reps;Weights Long Arc Quad Weight: 3 lbs. Supine Quad Sets: Right;10 reps Short Arc Quad Sets: Left;10 reps;Limitations Water quality scientist Limitations: 3# Heel Slides: 10 reps Hip Adduction Isometric: 10 reps;Limitations Hip Adduction Isometric Limitations: 5" with ball Bridges: 10 reps Straight Leg Raises: 10 reps;Limitations Straight Leg Raises Limitations: 3# Sidelying Hip ABduction: Left;10 reps Hip ADduction: Left;10 reps Prone  Hamstring Curl: 10 reps;Limitations Hamstring Curl Limitations: 3# Hip Extension: Left;10 reps   Modalities Modalities: Cryotherapy Cryotherapy Number Minutes Cryotherapy: 10 Minutes Cryotherapy Location: Knee Type of Cryotherapy: Ice pack  Physical Therapy Assessment and Plan PT Assessment and Plan Clinical Impression Statement: Pt compliant with HEP and able to perform all exercises correctly with no cueing required.  Able to add 3# with some activities, pt tolerated well but did present with visible fatigue at the end of reps.   PT Plan: Begin tandem stance/gait, step down/stair training; increase  reps or add weight with mat activities next session.    Goals    Problem List Patient Active Problem List  Diagnoses  . Breast CA  . Bone metastasis  . Bursitis of knee  . Medial meniscus, posterior horn derangement  . S/P arthroscopy of left knee  . Pain in joint, lower leg  . Difficulty in walking    PT - End of Session Activity Tolerance: Patient tolerated treatment well General Behavior During Session: [facility] for tasks performed Cognition: [facility] for tasks performed  Juel Burrow, PTA 03/30/2011, 10:08 AM

## 2011-04-04 ENCOUNTER — Ambulatory Visit (HOSPITAL_COMMUNITY)
Admission: RE | Admit: 2011-04-04 | Discharge: 2011-04-04 | Disposition: A | Discharge status: Home / Self Care | Payer: Medicaid Other | Source: Ambulatory Visit | Attending: Orthopedic Surgery | Admitting: Orthopedic Surgery

## 2011-04-04 NOTE — Progress Notes (Signed)
Physical Therapy Treatment Patient Details  Name: Kaylee Mitchell MRN: [id number] Date of Birth: 12-30-1955  Today's Date: 04/04/2011 Time: 4098-1191 Time Calculation (min): 48 min Medicaid Authorization Time Period:    Medicaid Visit#: 2  of 3   Charges: 38' TE, 10' man, 1 ice   Subjective: Symptoms/Limitations Symptoms: I am a little stiff today.  I think it is because of the weather.  Pain Assessment Currently in Pain?: Yes Pain Score:   3 Pain Location: Knee Pain Orientation: Left  Exercise/Treatments Standing Heel Raises: 20 reps Heel Raises Limitations: Toe Raises 20x Lateral Step Up: Left;10 reps;Hand Hold: 2;Step Height: 4" Forward Step Up: Left;10 reps;Hand Hold: 0;Step Height: 4" Step Down: Left;10 reps;Hand Hold: 1;Step Height: 4" Functional Squat: 20 reps Other Standing Knee Exercises: Heel/Toe walking 1 RT each, walking over 6 in hurdles x8 Seated Long Arc Quad: Left;10 reps;Limitations Long Arc Quad Limitations: 10 sec holds, secondary to pt does not have weights at home. Supine Hip Adduction Isometric Limitations: 10x10 sec hold w/ball Sidelying Clams: Left 3x10 sec after NMR was established Prone      Modalities Modalities: Cryotherapy Manual Therapy Manual Therapy: Joint mobilization Joint Mobilization: Grade I-II L proximal tibfib to decrease pain.  Inf/Sup patellar joint mobs.  Cryotherapy Number Minutes Cryotherapy: 10 Minutes Cryotherapy Location: Knee Type of Cryotherapy: Ice pack  Physical Therapy Assessment and Plan PT Assessment and Plan Clinical Impression Statement: She continues to require cueing for appropriate motion, however has shown improvement with her balance and knee strength.  Continues to demonstrate decrease hip endurance and coordination, which improved after NMR activities.  Able to decrease pts pain to 1/10 PT Plan: Last visit per medicaid.  Cont to educate on her HEP to estabilish independence.     Goals    Problem  List Patient Active Problem List  Diagnoses  . Breast CA  . Bone metastasis  . Bursitis of knee  . Medial meniscus, posterior horn derangement  . S/P arthroscopy of left knee  . Pain in joint, lower leg  . Difficulty in walking       GP No functional reporting required  Kaylee Mitchell 04/04/2011, 10:21 AM

## 2011-04-06 ENCOUNTER — Encounter (HOSPITAL_COMMUNITY): Payer: Medicaid Other | Attending: Oncology | Admitting: Oncology

## 2011-04-06 VITALS — BP 107/75 | HR 105 | Temp 98.2°F | Ht 65.0 in | Wt 308.3 lb

## 2011-04-06 DIAGNOSIS — C50919 Malignant neoplasm of unspecified site of unspecified female breast: Secondary | ICD-10-CM

## 2011-04-06 DIAGNOSIS — C7952 Secondary malignant neoplasm of bone marrow: Secondary | ICD-10-CM

## 2011-04-06 DIAGNOSIS — C801 Malignant (primary) neoplasm, unspecified: Secondary | ICD-10-CM | POA: Insufficient documentation

## 2011-04-06 DIAGNOSIS — C7951 Secondary malignant neoplasm of bone: Secondary | ICD-10-CM | POA: Insufficient documentation

## 2011-04-06 DIAGNOSIS — C50219 Malignant neoplasm of upper-inner quadrant of unspecified female breast: Secondary | ICD-10-CM

## 2011-04-06 LAB — COMPREHENSIVE METABOLIC PANEL
ALT: 16 U/L (ref 0–35)
AST: 15 U/L (ref 0–37)
Albumin: 3.3 g/dL — ABNORMAL LOW (ref 3.5–5.2)
CO2: 27 mEq/L (ref 19–32)
Calcium: 8.8 mg/dL (ref 8.4–10.5)
Creatinine, Ser: 1.08 mg/dL (ref 0.50–1.10)
Sodium: 140 mEq/L (ref 135–145)
Total Protein: 6.8 g/dL (ref 6.0–8.3)

## 2011-04-06 LAB — CBC
MCH: 28.3 pg (ref 26.0–34.0)
MCHC: 31.9 g/dL (ref 30.0–36.0)
Platelets: 155 10*3/uL (ref 150–400)
RBC: 4.27 MIL/uL (ref 3.87–5.11)
RDW: 13.7 % (ref 11.5–15.5)

## 2011-04-06 NOTE — Patient Instructions (Signed)
Kaylee Mitchell  [id number] 1955-12-30   [facility]  Discharge Instructions  RECOMMENDATIONS MADE BY THE CONSULTANT AND ANY TEST RESULTS WILL BE SENT TO YOUR REFERRING DOCTOR.   EXAM FINDINGS BY MD TODAY AND SIGNS AND SYMPTOMS TO REPORT TO CLINIC OR PRIMARY MD:  You are doing well.  We will not make any changes.  Will check some labs today.  MEDICATIONS PRESCRIBED: none   INSTRUCTIONS GIVEN AND DISCUSSED: Other :  Report any new lumps, bone pain or shortness of breath.  SPECIAL INSTRUCTIONS/FOLLOW-UP: Lab work Needed today,  Pamidronate next week,  Xray Studies Needed: Bone Scan and Return to Clinic in 3 months to see PA.  I acknowledge that I have been informed and understand all the instructions given to me and received a copy. I do not have any more questions at this time, but understand that I may call the Specialty Clinic at [facility] at [phone] during business hours should I have any further questions or need assistance in obtaining follow-up care.    __________________________________________  _____________  __________ Signature of Patient or Authorized Representative            Date                   Time    __________________________________________ Nurse's Signature

## 2011-04-06 NOTE — Progress Notes (Signed)
Kaylee Mitchell is here today for followup of her metastatic breast cancer. She was initially diagnosed in May 2011. She was treated originally with an aromatase inhibitor namely Aromasin next. She then progressed in March 2012 we switched her to tamoxifen at that time. She has done very well with that. She is due for a bone scan which we will schedule for next week. She also continues her IV pamidronate. Her review of systems is positive for recent arthroscopic surgery of the knee by Dr. Fuller [place]. She states she states the pain is much improved. Her physical exam shows stability of her vital signs. Her weight is still excessive for her height. She states her sugars are running around 100. She has a negative chest wall the right and negative left breast exam. Her lungs are clear. Her heart shows a regular rhythm and rate. Her abdomen is obese without obvious masses or organomegaly. She has no leg edema no arm edema.  We will schedule her bone scan and do lab work today in anticipation of pamidronate. We will see her if all is well in 12 weeks sooner if we must

## 2011-04-11 ENCOUNTER — Encounter: Payer: Self-pay | Admitting: Orthopedic Surgery

## 2011-04-11 ENCOUNTER — Telehealth (HOSPITAL_COMMUNITY): Payer: Self-pay | Admitting: Physical Therapy

## 2011-04-11 ENCOUNTER — Other Ambulatory Visit (HOSPITAL_COMMUNITY): Payer: Self-pay | Admitting: Oncology

## 2011-04-11 ENCOUNTER — Ambulatory Visit (INDEPENDENT_AMBULATORY_CARE_PROVIDER_SITE_OTHER): Payer: Medicaid Other | Admitting: Orthopedic Surgery

## 2011-04-11 ENCOUNTER — Ambulatory Visit (HOSPITAL_COMMUNITY): Payer: Self-pay | Admitting: Physical Therapy

## 2011-04-11 VITALS — BP 100/60 | Ht 65.0 in | Wt 308.0 lb

## 2011-04-11 DIAGNOSIS — M23329 Other meniscus derangements, posterior horn of medial meniscus, unspecified knee: Secondary | ICD-10-CM

## 2011-04-11 DIAGNOSIS — Z9889 Other specified postprocedural states: Secondary | ICD-10-CM

## 2011-04-11 DIAGNOSIS — C7951 Secondary malignant neoplasm of bone: Secondary | ICD-10-CM

## 2011-04-11 DIAGNOSIS — M25569 Pain in unspecified knee: Secondary | ICD-10-CM

## 2011-04-11 DIAGNOSIS — C50919 Malignant neoplasm of unspecified site of unspecified female breast: Secondary | ICD-10-CM

## 2011-04-11 MED ORDER — HYDROCODONE-ACETAMINOPHEN 7.5-300 MG PO TABS: 1.0000 | ORAL_TABLET | ORAL | Status: DC | PRN

## 2011-04-11 NOTE — Patient Instructions (Addendum)
Finish physical therapy   Take 1 neurontin at night

## 2011-04-11 NOTE — Progress Notes (Signed)
Patient ID: Kaylee Mitchell, female   DOB: 1955/07/07, 56 y.o.   MRN: [id number] Chief Complaint  Patient presents with  . Follow-up    3 week recheck on left knee.    Visit after knee arthroscopy, LEFT knee.  Doing well. Crutches have been eliminated and therapy is going well. She has full range of motion her night pain is resolved. She is on Neurontin. She is encouraged to take one at night instead of one 3 times a day.  Follow up as needed

## 2011-04-12 ENCOUNTER — Encounter (HOSPITAL_BASED_OUTPATIENT_CLINIC_OR_DEPARTMENT_OTHER): Payer: Medicaid Other

## 2011-04-12 VITALS — BP 145/93 | HR 95 | Temp 97.6°F

## 2011-04-12 DIAGNOSIS — C50919 Malignant neoplasm of unspecified site of unspecified female breast: Secondary | ICD-10-CM

## 2011-04-12 DIAGNOSIS — C7951 Secondary malignant neoplasm of bone: Secondary | ICD-10-CM

## 2011-04-12 DIAGNOSIS — C50219 Malignant neoplasm of upper-inner quadrant of unspecified female breast: Secondary | ICD-10-CM

## 2011-04-12 MED ORDER — SODIUM CHLORIDE 0.9 % IV SOLN
Freq: Once | INTRAVENOUS | Status: AC
  Administered 2011-04-12: 09:00:00 via INTRAVENOUS

## 2011-04-12 MED ORDER — SODIUM CHLORIDE 0.9 % IJ SOLN
10.0000 mL | INTRAMUSCULAR | Status: DC | PRN
  Administered 2011-04-12: 10 mL
  Filled 2011-04-12: qty 10

## 2011-04-12 MED ORDER — PAMIDRONATE DISODIUM 30 MG/10ML IV SOLN
60.0000 mg | Freq: Once | INTRAVENOUS | Status: AC
  Administered 2011-04-12: 60 mg via INTRAVENOUS
  Filled 2011-04-12: qty 500

## 2011-04-12 MED ORDER — SODIUM CHLORIDE 0.9 % IJ SOLN
INTRAMUSCULAR | Status: AC
  Administered 2011-04-12: 10 mL
  Filled 2011-04-12: qty 10

## 2011-04-12 NOTE — Progress Notes (Signed)
Tolerated infusion well. 

## 2011-04-13 ENCOUNTER — Encounter (HOSPITAL_COMMUNITY): Payer: Medicare Other

## 2011-04-19 ENCOUNTER — Ambulatory Visit (HOSPITAL_COMMUNITY)
Admission: RE | Admit: 2011-04-19 | Discharge: 2011-04-19 | Disposition: A | Discharge status: Home / Self Care | Payer: Medicaid Other | Source: Ambulatory Visit | Attending: Family Medicine | Admitting: Family Medicine

## 2011-04-19 DIAGNOSIS — M6281 Muscle weakness (generalized): Secondary | ICD-10-CM | POA: Insufficient documentation

## 2011-04-19 DIAGNOSIS — IMO0001 Reserved for inherently not codable concepts without codable children: Secondary | ICD-10-CM | POA: Insufficient documentation

## 2011-04-19 DIAGNOSIS — R262 Difficulty in walking, not elsewhere classified: Secondary | ICD-10-CM | POA: Insufficient documentation

## 2011-04-19 NOTE — Progress Notes (Signed)
Physical Therapy Treatment  Patient Details  Name: Kaylee Mitchell MRN: [id number] Date of Birth: Dec 08, 1955  Today's Date: 04/19/2011 Time: 4098-1191 Time Calculation (min): 48 min  Visit#: 3  of 3   Re-eval:    Charge: therex 38 min Self care 10 min  Subjective Symptoms/Limitations Symptoms: PTT started pt on bike, pt stated no pain today, feeling good.  Pt reported compliance with HEP. Pain Assessment Currently in Pain?: No/denies  Objective:   Sensation/Coordination/Flexibility/Functional Tests Functional Tests Functional Tests: Lower Extremity Functional Scale: 46/80   Exercise/Treatments Aerobic Stationary Bike: 8' @ 2.0 Standing Heel Raises: 20 reps Heel Raises Limitations: Toe Raises 20x Knee Flexion: 2 sets;10 reps Lateral Step Up: Left;Hand Hold: 2;Step Height: 4";10 reps Forward Step Up: Left;15 reps;Hand Hold: 2;Step Height: 4" Step Down: Left;10 reps;Hand Hold: 1;Step Height: 4" Functional Squat: 20 reps Seated Long Arc Quad: Left;2 sets;10 reps Supine Quad Sets: Right;10 reps Heel Slides: 10 reps Bridges: 10 reps Straight Leg Raises: 10 reps;Limitations Straight Leg Raises Limitations: no weight at home Sidelying Hip ABduction: Left;2 sets;10 reps Clams: Left 5x 10" Prone  Hamstring Curl: 2 sets;10 reps Hip Extension: Left;15 reps      Physical Therapy Assessment and Plan PT Assessment and Plan Clinical Impression Statement: 3rd visit for Medicaid coverage.  Pt given notification and signed letter indicating pt aware Medicaid pays for 3 visit.  Reviewed HEP with min cueing for proper form/tech with prone exercises.  Pt able to perform correctly following cues and was given HEP worksheet for step training.  Overall improvement with pain reduction, independent with HEP, improve perceived functional abilites by 29 points. PT Plan: D/C to HEP, pt given name and number for financial aide.    Goals Home Exercise Program Pt will Perform Home Exercise  Program: Independently PT Goal: Perform Home Exercise Program - Progress: Met PT Short Term Goals Time to Complete Short Term Goals: 2 weeks PT Short Term Goal 1: Pt will report pain less than or equal to 3/10 for 50% of her day. PT Short Term Goal 1 - Progress: Met PT Short Term Goal 2: Pt will ambulate independently for 2 minutes.  PT Short Term Goal 2 - Progress: Met PT Short Term Goal 3: Pt will improve her LE MMT by 1/2 grade. PT Short Term Goal 4: Pt will demonstrate L SLS on static surface x10 sec PT Long Term Goals Time to Complete Long Term Goals: 4 weeks PT Long Term Goal 1: Pt will report pain less than 2/10 for 75% of her day for improved QOL PT Long Term Goal 1 - Progress: Met PT Long Term Goal 2: Pt will improve her LEFS score by 9 points for improved percieved functional ability.  PT Long Term Goal 2 - Progress: Met Long Term Goal 3: Pt will improve her functional strength to WNL in order to ascend and descend 5 stairs with one handrail with reciproocal pattern.   Problem List Patient Active Problem List  Diagnoses  . Breast CA  . Bone metastasis  . Bursitis of knee  . Medial meniscus, posterior horn derangement  . S/P arthroscopy of left knee  . Pain in joint, lower leg  . Difficulty in walking    PT - End of Session Activity Tolerance: Patient tolerated treatment well General Behavior During Session: [facility] for tasks performed Cognition: [facility] for tasks performed   Juel Burrow, PTA 04/19/2011, 12:18 PM

## 2011-04-20 ENCOUNTER — Encounter (HOSPITAL_COMMUNITY): Payer: Self-pay

## 2011-04-20 ENCOUNTER — Encounter (HOSPITAL_COMMUNITY)
Admission: RE | Admit: 2011-04-20 | Discharge: 2011-04-20 | Disposition: A | Discharge status: Home / Self Care | Payer: Medicaid Other | Source: Ambulatory Visit | Attending: Oncology | Admitting: Oncology

## 2011-04-20 DIAGNOSIS — C801 Malignant (primary) neoplasm, unspecified: Secondary | ICD-10-CM | POA: Insufficient documentation

## 2011-04-20 DIAGNOSIS — C50919 Malignant neoplasm of unspecified site of unspecified female breast: Secondary | ICD-10-CM | POA: Insufficient documentation

## 2011-04-20 MED ORDER — TECHNETIUM TC 99M MEDRONATE IV KIT
25.0000 | PACK | Freq: Once | INTRAVENOUS | Status: AC | PRN
  Administered 2011-04-20: 23 via INTRAVENOUS

## 2011-05-04 ENCOUNTER — Ambulatory Visit (INDEPENDENT_AMBULATORY_CARE_PROVIDER_SITE_OTHER): Payer: Medicaid Other | Admitting: Orthopedic Surgery

## 2011-05-04 ENCOUNTER — Encounter: Payer: Self-pay | Admitting: Orthopedic Surgery

## 2011-05-04 VITALS — BP 100/60 | Ht 65.0 in | Wt 308.0 lb

## 2011-05-04 DIAGNOSIS — M23329 Other meniscus derangements, posterior horn of medial meniscus, unspecified knee: Secondary | ICD-10-CM

## 2011-05-04 NOTE — Patient Instructions (Signed)
Continue home exercises.

## 2011-05-04 NOTE — Progress Notes (Signed)
Patient ID: Kaylee Mitchell, female   DOB: 1955-03-20, 56 y.o.   MRN: [id number] Chief Complaint  Patient presents with  . Follow-up    3 week recheck left knee, DOS 03/18/11    6 week followup status post LEFT knee scope. Doing well and has a slight limp. Some weakness. Medicaid only allowed 3 physical therapy visits, so she's doing a home exercise program. Her range of motion is full at this, point, she's having minimal pain and has basically completed treatment. Followup with [place] as needed. Continue home exercises.

## 2011-05-06 NOTE — Progress Notes (Signed)
Labs drawn

## 2011-05-11 ENCOUNTER — Encounter (HOSPITAL_COMMUNITY): Payer: Medicaid Other | Attending: Oncology

## 2011-05-11 VITALS — BP 140/91 | HR 83 | Temp 98.8°F

## 2011-05-11 DIAGNOSIS — C50219 Malignant neoplasm of upper-inner quadrant of unspecified female breast: Secondary | ICD-10-CM

## 2011-05-11 DIAGNOSIS — C7952 Secondary malignant neoplasm of bone marrow: Secondary | ICD-10-CM

## 2011-05-11 DIAGNOSIS — C50919 Malignant neoplasm of unspecified site of unspecified female breast: Secondary | ICD-10-CM | POA: Insufficient documentation

## 2011-05-11 DIAGNOSIS — C7951 Secondary malignant neoplasm of bone: Secondary | ICD-10-CM | POA: Insufficient documentation

## 2011-05-11 DIAGNOSIS — C801 Malignant (primary) neoplasm, unspecified: Secondary | ICD-10-CM | POA: Insufficient documentation

## 2011-05-11 LAB — COMPREHENSIVE METABOLIC PANEL
Alkaline Phosphatase: 66 U/L (ref 39–117)
BUN: 13 mg/dL (ref 6–23)
Creatinine, Ser: 1.03 mg/dL (ref 0.50–1.10)
GFR calc Af Amer: 69 mL/min — ABNORMAL LOW (ref >90)
Glucose, Bld: 156 mg/dL — ABNORMAL HIGH (ref 70–99)
Potassium: 3.8 mEq/L (ref 3.5–5.1)
Total Bilirubin: 0.2 mg/dL — ABNORMAL LOW (ref 0.3–1.2)
Total Protein: 6.7 g/dL (ref 6.0–8.3)

## 2011-05-11 MED ORDER — SODIUM CHLORIDE 0.9 % IV SOLN
60.0000 mg | Freq: Once | INTRAVENOUS | Status: AC
  Administered 2011-05-11: 60 mg via INTRAVENOUS
  Filled 2011-05-11: qty 500

## 2011-05-11 MED ORDER — SODIUM CHLORIDE 0.9 % IJ SOLN
INTRAMUSCULAR | Status: AC
  Filled 2011-05-11: qty 10

## 2011-05-11 MED ORDER — SODIUM CHLORIDE 0.9 % IV SOLN
Freq: Once | INTRAVENOUS | Status: AC
  Administered 2011-05-11: 09:00:00 via INTRAVENOUS

## 2011-05-11 MED ORDER — SODIUM CHLORIDE 0.9 % IJ SOLN
10.0000 mL | INTRAMUSCULAR | Status: DC | PRN
  Administered 2011-05-11: 10 mL
  Filled 2011-05-11: qty 10

## 2011-05-11 NOTE — Progress Notes (Signed)
Tolerated pamidronate infusion well. 

## 2011-06-10 ENCOUNTER — Encounter (HOSPITAL_COMMUNITY): Payer: Medicaid Other | Attending: Oncology

## 2011-06-10 ENCOUNTER — Other Ambulatory Visit (HOSPITAL_COMMUNITY): Payer: Self-pay | Admitting: Oncology

## 2011-06-10 ENCOUNTER — Telehealth (HOSPITAL_COMMUNITY): Payer: Self-pay | Admitting: *Deleted

## 2011-06-10 VITALS — BP 148/83 | HR 91 | Temp 97.8°F

## 2011-06-10 DIAGNOSIS — R059 Cough, unspecified: Secondary | ICD-10-CM | POA: Insufficient documentation

## 2011-06-10 DIAGNOSIS — C7952 Secondary malignant neoplasm of bone marrow: Secondary | ICD-10-CM | POA: Insufficient documentation

## 2011-06-10 DIAGNOSIS — C50919 Malignant neoplasm of unspecified site of unspecified female breast: Secondary | ICD-10-CM | POA: Insufficient documentation

## 2011-06-10 DIAGNOSIS — C801 Malignant (primary) neoplasm, unspecified: Secondary | ICD-10-CM | POA: Insufficient documentation

## 2011-06-10 DIAGNOSIS — R05 Cough: Secondary | ICD-10-CM | POA: Insufficient documentation

## 2011-06-10 DIAGNOSIS — C7951 Secondary malignant neoplasm of bone: Secondary | ICD-10-CM

## 2011-06-10 DIAGNOSIS — C50219 Malignant neoplasm of upper-inner quadrant of unspecified female breast: Secondary | ICD-10-CM

## 2011-06-10 DIAGNOSIS — R058 Other specified cough: Secondary | ICD-10-CM

## 2011-06-10 LAB — COMPREHENSIVE METABOLIC PANEL
ALT: 13 U/L (ref 0–35)
AST: 16 U/L (ref 0–37)
CO2: 27 mEq/L (ref 19–32)
Calcium: 9.2 mg/dL (ref 8.4–10.5)
Sodium: 140 mEq/L (ref 135–145)
Total Protein: 6.9 g/dL (ref 6.0–8.3)

## 2011-06-10 MED ORDER — SODIUM CHLORIDE 0.9 % IJ SOLN
INTRAMUSCULAR | Status: AC
  Filled 2011-06-10: qty 20

## 2011-06-10 MED ORDER — HYDROCOD POLST-CHLORPHEN POLST 10-8 MG/5ML PO LQCR: 5.0000 mL | Freq: Every evening | ORAL | Status: DC | PRN

## 2011-06-10 MED ORDER — SODIUM CHLORIDE 0.9 % IV SOLN
60.0000 mg | Freq: Once | INTRAVENOUS | Status: AC
  Administered 2011-06-10: 60 mg via INTRAVENOUS
  Filled 2011-06-10: qty 500

## 2011-06-10 MED ORDER — SODIUM CHLORIDE 0.9 % IJ SOLN
10.0000 mL | INTRAMUSCULAR | Status: DC | PRN
  Administered 2011-06-10: 10 mL
  Filled 2011-06-10: qty 10

## 2011-06-10 MED ORDER — SODIUM CHLORIDE 0.9 % IV SOLN
Freq: Once | INTRAVENOUS | Status: AC
  Administered 2011-06-10: 10:00:00 via INTRAVENOUS

## 2011-06-10 NOTE — Progress Notes (Signed)
Tolerated infusion well. 

## 2011-06-21 ENCOUNTER — Emergency Department (HOSPITAL_COMMUNITY): Payer: Medicaid Other

## 2011-06-21 ENCOUNTER — Encounter (HOSPITAL_COMMUNITY): Payer: Self-pay | Admitting: *Deleted

## 2011-06-21 ENCOUNTER — Emergency Department (HOSPITAL_COMMUNITY)
Admission: EM | Admit: 2011-06-21 | Discharge: 2011-06-21 | Disposition: A | Discharge status: Home / Self Care | Payer: Medicaid Other | Attending: Emergency Medicine | Admitting: Emergency Medicine

## 2011-06-21 DIAGNOSIS — R0781 Pleurodynia: Secondary | ICD-10-CM

## 2011-06-21 DIAGNOSIS — C7951 Secondary malignant neoplasm of bone: Secondary | ICD-10-CM | POA: Insufficient documentation

## 2011-06-21 DIAGNOSIS — C50919 Malignant neoplasm of unspecified site of unspecified female breast: Secondary | ICD-10-CM | POA: Insufficient documentation

## 2011-06-21 DIAGNOSIS — E119 Type 2 diabetes mellitus without complications: Secondary | ICD-10-CM | POA: Insufficient documentation

## 2011-06-21 DIAGNOSIS — E7800 Pure hypercholesterolemia, unspecified: Secondary | ICD-10-CM | POA: Insufficient documentation

## 2011-06-21 DIAGNOSIS — R109 Unspecified abdominal pain: Secondary | ICD-10-CM | POA: Insufficient documentation

## 2011-06-21 DIAGNOSIS — I10 Essential (primary) hypertension: Secondary | ICD-10-CM | POA: Insufficient documentation

## 2011-06-21 DIAGNOSIS — C799 Secondary malignant neoplasm of unspecified site: Secondary | ICD-10-CM

## 2011-06-21 DIAGNOSIS — R079 Chest pain, unspecified: Secondary | ICD-10-CM | POA: Insufficient documentation

## 2011-06-21 DIAGNOSIS — Z79899 Other long term (current) drug therapy: Secondary | ICD-10-CM | POA: Insufficient documentation

## 2011-06-21 LAB — CBC
HCT: 38.6 % (ref 36.0–46.0)
Hemoglobin: 12.6 g/dL (ref 12.0–15.0)
MCH: 28.6 pg (ref 26.0–34.0)
MCHC: 32.6 g/dL (ref 30.0–36.0)

## 2011-06-21 LAB — URINALYSIS, ROUTINE W REFLEX MICROSCOPIC
Bilirubin Urine: NEGATIVE
Ketones, ur: NEGATIVE mg/dL
Nitrite: NEGATIVE
Urobilinogen, UA: 0.2 mg/dL (ref 0.0–1.0)
pH: 5.5 (ref 5.0–8.0)

## 2011-06-21 LAB — BASIC METABOLIC PANEL
BUN: 28 mg/dL — ABNORMAL HIGH (ref 6–23)
Creatinine, Ser: 1.56 mg/dL — ABNORMAL HIGH (ref 0.50–1.10)
GFR calc Af Amer: 42 mL/min — ABNORMAL LOW (ref >90)
GFR calc non Af Amer: 36 mL/min — ABNORMAL LOW (ref >90)

## 2011-06-21 LAB — DIFFERENTIAL
Basophils Relative: 0 % (ref 0–1)
Eosinophils Absolute: 0.2 10*3/uL (ref 0.0–0.7)
Monocytes Absolute: 0.6 10*3/uL (ref 0.1–1.0)
Monocytes Relative: 7 % (ref 3–12)

## 2011-06-21 MED ORDER — ONDANSETRON HCL 4 MG/2ML IJ SOLN
4.0000 mg | Freq: Once | INTRAMUSCULAR | Status: AC
  Administered 2011-06-21: 4 mg via INTRAVENOUS
  Filled 2011-06-21: qty 2

## 2011-06-21 MED ORDER — SODIUM CHLORIDE 0.9 % IV SOLN
Freq: Once | INTRAVENOUS | Status: AC
  Administered 2011-06-21: 05:00:00 via INTRAVENOUS

## 2011-06-21 MED ORDER — HYDROMORPHONE HCL PF 1 MG/ML IJ SOLN
1.0000 mg | Freq: Once | INTRAMUSCULAR | Status: AC
  Administered 2011-06-21: 1 mg via INTRAVENOUS
  Filled 2011-06-21: qty 1

## 2011-06-21 MED ORDER — HYDROCODONE-ACETAMINOPHEN 5-325 MG PO TABS: 1.0000 | ORAL_TABLET | ORAL | Status: AC | PRN

## 2011-06-21 NOTE — ED Notes (Signed)
EKG performed at 0120 NSR

## 2011-06-21 NOTE — Discharge Instructions (Signed)
Try tho have a bowel movement daily. Use a mild laxative like Miralax daily if needed. Use the pain medicine as needed. Continue your regular medicines.

## 2011-06-21 NOTE — ED Notes (Signed)
Pt states "I started having pain in my left side that goes to my chest (center), it's worse when I lay down flat or on my side.

## 2011-06-21 NOTE — ED Provider Notes (Signed)
History     CSN: [id number]  Arrival date & time 06/21/11  0106   First MD Initiated Contact with Patient 06/21/11 [phone]      Chief Complaint  Patient presents with  . Abdominal Pain  . Chest Pain    (Consider location/radiation/quality/duration/timing/severity/associated sxs/prior treatment) HPI Kaylee Mitchell is a 56 y.o. female  With metastatic breast cancer who presents to the Emergency Department complaining of left sided rib pain and pain across the upper part of her abdomen that is worse when she lies down. Denies fever, chills, cough, shortness of breath. Pain is not responding to her oral pain medication.  PCP Dr. Dimas Aguas Oncologist Dr. Mariel Sleet    Past Medical History  Diagnosis Date  . Hypertension   . High cholesterol   . Knee pain, bilateral 2013  . Cancer     rt breast is primary  . Bone metastasis 09/13/2010  . Diabetes mellitus     type II  . Depression     Past Surgical History  Procedure Date  . Knee surgery     right-arthroscopy  . Mastectomy     right side  . Abdominal hysterectomy     Family History  Problem Relation Age of Onset  . Arthritis    . Cancer    . Diabetes    . Anesthesia problems Neg Hx   . Hypotension Neg Hx   . Malignant hyperthermia Neg Hx   . Pseudochol deficiency Neg Hx     History  Substance Use Topics  . Smoking status: Never Smoker   . Smokeless tobacco: Not on file  . Alcohol Use: No    OB History    Grav Para Term Preterm Abortions TAB SAB Ect Mult Living                  Review of Systems  Constitutional: Negative for fever.       10 Systems reviewed and are negative for acute change except as noted in the HPI.  HENT: Negative for congestion.   Eyes: Negative for discharge and redness.  Respiratory: Negative for cough and shortness of breath.   Cardiovascular: Negative for chest pain.  Gastrointestinal: Positive for abdominal pain. Negative for vomiting.  Musculoskeletal: Negative for back pain.      Left sided rib pain  Skin: Negative for rash.  Neurological: Negative for syncope, numbness and headaches.  Psychiatric/Behavioral:       No behavior change.    Allergies  Review of patient's allergies indicates no known allergies.  Home Medications   Current Outpatient Rx  Name Route Sig Dispense Refill  . ASPIRIN EC 81 MG PO TBEC Oral Take 81 mg by mouth daily.      . ATORVASTATIN CALCIUM 20 MG PO TABS Oral Take 20 mg by mouth daily.      Marland Kitchen CALCIUM CARBONATE-VITAMIN D 500-200 MG-UNIT PO TABS Oral Take 1 tablet by mouth daily.      Marland Kitchen CARVEDILOL 25 MG PO TABS Oral Take 25 mg by mouth daily.      . INSULIN GLARGINE 100 UNIT/ML Gravity SOLN Subcutaneous Inject 80 Units into the skin at bedtime.     Marland Kitchen LISINOPRIL-HYDROCHLOROTHIAZIDE 20-25 MG PO TABS Oral Take 1 tablet by mouth daily.      Marland Kitchen LORAZEPAM 0.5 MG PO TABS Oral Take 0.5 mg by mouth every 8 (eight) hours as needed. For nausea    . METFORMIN HCL 1000 MG PO TABS Oral Take 1,000 mg by  mouth 2 (two) times daily with a meal.      . SERTRALINE HCL 50 MG PO TABS Oral Take 1.5 tablets (75 mg total) by mouth daily. 45 tablet 3  . TAMOXIFEN CITRATE 20 MG PO TABS Oral Take 20 mg by mouth daily.    Marland Kitchen HYDROCOD POLST-CPM POLST ER 10-8 MG/5ML PO LQCR Oral Take 5 mLs by mouth at bedtime as needed. 100 mL 0  . GABAPENTIN 100 MG PO CAPS Oral Take 1 capsule (100 mg total) by mouth 3 (three) times daily. 90 capsule 2  . HYDROCODONE-ACETAMINOPHEN 5-325 MG PO TABS Oral Take 1 tablet by mouth every 4 (four) hours as needed for pain. 30 tablet 0  . HYDROCODONE-ACETAMINOPHEN 7.5-300 MG PO TABS Oral Take 1 tablet by mouth every 4 (four) hours as needed. 90 each 2  . PREDNISONE 10 MG PO KIT Oral Take 1 kit (10 mg total) by mouth as directed. 1 kit 0    48    BP 153/77  Pulse 99  Temp(Src) 98.3 F (36.8 C) (Oral)  Resp 15  Ht 5\' 5"  (1.651 m)  Wt 310 lb (140.615 kg)  BMI 51.59 kg/m2  SpO2 90%  Physical Exam  Nursing note and vitals  reviewed. Constitutional:       Awake, alert, nontoxic appearance.  HENT:  Head: Atraumatic.  Eyes: Right eye exhibits no discharge. Left eye exhibits no discharge.  Neck: Neck supple.  Cardiovascular: Normal rate, normal heart sounds and intact distal pulses.   Pulmonary/Chest: Effort normal. She exhibits no tenderness.       Right mastectomy  Abdominal: Soft. There is tenderness. There is no rebound.       Mild tenderness to upper abdomen left, epigastric, and right  Musculoskeletal: She exhibits tenderness.       Baseline ROM, no obvious new focal weakness.Mild tenderenss over the left sided ribs, no crepitus, no deformity.  Neurological:       Mental status and motor strength appears baseline for patient and situation.  Skin: No rash noted.  Psychiatric: She has a normal mood and affect.    ED Course  Procedures (including critical care time)   Dg Abd Acute W/chest  06/21/2011  *RADIOLOGY REPORT*  Clinical Data: The abdominal pain and nausea.  ACUTE ABDOMEN SERIES (ABDOMEN 2 VIEW & CHEST 1 VIEW)  Comparison: None  Findings: The upright chest x-ray demonstrates no acute findings. There are surgical changes from a right mastectomy and axillary lymph node dissection.  The heart is normal in size.  There is mild tortuosity and ectasia of the thoracic aorta.  The lungs are clear.  The abdominal bowel gas pattern is unremarkable.  No findings for obstruction or perforation.  Sclerotic bone metastasis are again noted.  IMPRESSION:  1.  No acute cardial pulmonary findings. 2.  No plain film findings for an acute abdominal process. 3.  Diffuse sclerotic metastatic bone disease.  Original Report Authenticated By: P. Loralie Champagne, M.D.    Date: 06/21/2011    0120  Rate: 91  Rhythm: normal sinus rhythm  QRS Axis: normal  Intervals: normal  ST/T Wave abnormalities: normal  Conduction Disutrbances:none  Narrative Interpretation:   Old EKG Reviewed: unchanged c/w 03/14/2011    1.  Abdominal pain   2. Rib pain   3. Metastatic adenocarcinoma       MDM  Patient with metastatic breast cancer here with left-sided rib pain and upper abdominal pain. EKG is unremarkable. Acute abdominal series shows only a  normal gas pattern. There is diffuse sclerotic metastatic bone disease. Patient was given an analgesic, antiemetic with relief of discomfort.results were reviewed with the patient. Pt feels improved after observation and/or treatment in ED.Pt stable in ED with no significant deterioration in condition.The patient appears reasonably screened and/or stabilized for discharge and I doubt any other medical condition or other [facility] requiring further screening, evaluation, or treatment in the ED at this time prior to discharge.  MDM Reviewed: nursing note, previous chart and vitals Reviewed previous: x-ray and CT scan Interpretation: x-ray and labs           Nicoletta Dress. Colon Branch, MD 06/22/11 [phone]

## 2011-07-07 ENCOUNTER — Ambulatory Visit (HOSPITAL_COMMUNITY): Payer: Medicaid Other | Admitting: Oncology

## 2011-07-11 ENCOUNTER — Encounter (HOSPITAL_COMMUNITY): Payer: Medicaid Other | Attending: Oncology

## 2011-07-11 ENCOUNTER — Encounter (HOSPITAL_BASED_OUTPATIENT_CLINIC_OR_DEPARTMENT_OTHER): Payer: Medicaid Other | Admitting: Oncology

## 2011-07-11 VITALS — BP 124/76 | HR 93 | Temp 97.6°F

## 2011-07-11 DIAGNOSIS — C801 Malignant (primary) neoplasm, unspecified: Secondary | ICD-10-CM | POA: Insufficient documentation

## 2011-07-11 DIAGNOSIS — C50919 Malignant neoplasm of unspecified site of unspecified female breast: Secondary | ICD-10-CM | POA: Insufficient documentation

## 2011-07-11 DIAGNOSIS — Z170 Estrogen receptor positive status [ER+]: Secondary | ICD-10-CM

## 2011-07-11 DIAGNOSIS — C7952 Secondary malignant neoplasm of bone marrow: Secondary | ICD-10-CM | POA: Insufficient documentation

## 2011-07-11 DIAGNOSIS — C7951 Secondary malignant neoplasm of bone: Secondary | ICD-10-CM | POA: Insufficient documentation

## 2011-07-11 DIAGNOSIS — Z9010 Acquired absence of unspecified breast and nipple: Secondary | ICD-10-CM

## 2011-07-11 DIAGNOSIS — C50219 Malignant neoplasm of upper-inner quadrant of unspecified female breast: Secondary | ICD-10-CM

## 2011-07-11 LAB — COMPREHENSIVE METABOLIC PANEL
Albumin: 3.2 g/dL — ABNORMAL LOW (ref 3.5–5.2)
BUN: 20 mg/dL (ref 6–23)
Chloride: 99 mEq/L (ref 96–112)
Creatinine, Ser: 1.16 mg/dL — ABNORMAL HIGH (ref 0.50–1.10)
GFR calc Af Amer: 59 mL/min — ABNORMAL LOW (ref >90)
Glucose, Bld: 180 mg/dL — ABNORMAL HIGH (ref 70–99)
Total Bilirubin: 0.3 mg/dL (ref 0.3–1.2)

## 2011-07-11 MED ORDER — SODIUM CHLORIDE 0.9 % IV SOLN
60.0000 mg | Freq: Once | INTRAVENOUS | Status: AC
  Administered 2011-07-11: 60 mg via INTRAVENOUS
  Filled 2011-07-11: qty 500

## 2011-07-11 MED ORDER — SODIUM CHLORIDE 0.9 % IV SOLN
Freq: Once | INTRAVENOUS | Status: AC
  Administered 2011-07-11: 10:00:00 via INTRAVENOUS

## 2011-07-11 MED ORDER — SODIUM CHLORIDE 0.9 % IJ SOLN
INTRAMUSCULAR | Status: AC
  Filled 2011-07-11: qty 10

## 2011-07-11 MED ORDER — HEPARIN SOD (PORK) LOCK FLUSH 100 UNIT/ML IV SOLN
500.0000 [IU] | Freq: Once | INTRAVENOUS | Status: DC | PRN
  Filled 2011-07-11: qty 5

## 2011-07-11 MED ORDER — SODIUM CHLORIDE 0.9 % IJ SOLN
10.0000 mL | INTRAMUSCULAR | Status: DC | PRN
  Administered 2011-07-11: 10 mL
  Filled 2011-07-11: qty 10

## 2011-07-11 NOTE — Patient Instructions (Signed)
[facility]  Discharge Instructions .......Marland KitchenAYDIA MAJ  [id number] 16-Feb-1955 Dr. Glenford Peers   RECOMMENDATIONS MADE BY THE CONSULTANT AND ANY TEST RESULTS WILL BE SENT TO YOUR REFERRING DOCTOR.   EXAM FINDINGS BY MD TODAY AND SIGNS AND SYMPTOMS TO REPORT TO CLINIC OR PRIMARY MD:  Exam good today  We will try to switch you from pamidronate to an injection that you will get once a month--xgeva Angie, our financial counselor will be working on this for you    SPECIAL INSTRUCTIONS/FOLLOW-UP: Lab work monthly Bone scan in October appt with Elijah Birk in 3 months   I acknowledge that I have been informed and understand all the instructions given to me and received a copy. I do not have any more questions at this time, but understand that I may call the Specialty Clinic at [facility] at [phone] during business hours should I have any further questions or need assistance in obtaining follow-up care.    __________________________________________  _____________  __________ Signature of Patient or Authorized Representative            Date                   Time    __________________________________________ Nurse's Signature

## 2011-07-11 NOTE — Progress Notes (Signed)
Tolerated pamidronate infusion well. 

## 2011-07-11 NOTE — Progress Notes (Signed)
Kaylee Flavin, MD [address] [place] [address]  1. Breast CA  CBC, Differential, Comprehensive metabolic panel, Cancer antigen 27.29, CBC, Differential, Basic metabolic panel, CBC, Differential, Comprehensive metabolic panel, Cancer antigen 27.29, NM Bone Scan Whole Body  2. Bone metastasis  CBC, Differential, Comprehensive metabolic panel, Cancer antigen 27.29, CBC, Differential, Basic metabolic panel, CBC, Differential, Comprehensive metabolic panel, Cancer antigen 27.29, NM Bone Scan Whole Body    CURRENT THERAPY: On daily Tamoxifen and IV Pamidronate  INTERVAL HISTORY: Kaylee Mitchell 56 y.o. 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 She presented with a 7 cm tumor, extensive LVI. Tumor focally extended to the anterior surface margin, 8 of 8 lymph nodes were involved with metastatic disease. She was ER positive 100%, PR positive 99%, HER2 neu negative, Ki-67 marker 14%. No liver involvement, no lung involvement and she had a mastectomy followed by radiation therapy to T4 through T7 due to severe involvement at this area. She was treated with Arimidex initially from 07/2009 through 03/23/2010 and was progressing by bone scan criteria. We switched her to tamoxifen which she is tolerating well and her bone scan is now stable. She has not received radiation therapy to the chest wall or axilla.   Kaylee questions whether she will require IV pamidronate lifelong.  I did confirm that she will require a bone protecting agent lifelong.  She is disappointed with this information because she is becoming a very difficult patient to gain IV access.  She explains that she was stuck multiple times today and a nurse was finally able to get a mediocre IV site.   As a result of her difficult IV access, we discussed the option of potentially switching her medicine to Xgeva (Denosumab) every 4 weeks.  She was enthused about  this option.  I discussed the patient's case with our financial counselor and she will require some information from the patient.  I discussed the potential cost of the medication with the patient which was discouraging for her, but we will investigate the cost for the patient.  I informed the patient that it is a subq injection and the side effects of the medication are similar to the Pamidronate which she is on including ONJ and hypocalcemia.  The patient was encouraged to take calcium and vitamin D.  She reports that she has been taking calcium and Vitamin D at home daily.  We discussed the risks, benefits, alternatives and adverse side effects of the Xgeva including ONJ and hypocalcemia.   Oncologically, the patient denies any complaints.  She is tolerating her Tamoxifen well.   I personally reviewed and went over laboratory results with the patient. I personally reviewed and went over radiographic studies with the patient.   Past Medical History  Diagnosis Date  . Hypertension   . High cholesterol   . Knee pain, bilateral 2013  . Cancer     rt breast is primary  . Bone metastasis 09/13/2010  . Diabetes mellitus     type II  . Depression     has Breast CA; Bone metastasis; Bursitis of knee; Medial meniscus, posterior horn derangement; S/P arthroscopy of left knee; Pain in joint, lower leg; and Difficulty in walking on her problem list.      has no known allergies.  Kaylee Mitchell had no medications administered during this visit.  Past Surgical History  Procedure Date  . Knee  surgery     right-arthroscopy  . Mastectomy     right side  . Abdominal hysterectomy     Ddenies any headaches, dizziness, double vision, fevers, chills, night sweats, nausea, vomiting, diarrhea, constipation, chest pain, heart palpitations, shortness of breath, blood in stool, black tarry stool, urinary pain, urinary burning, urinary frequency, hematuria.   PHYSICAL EXAMINATION  ECOG PERFORMANCE STATUS: 1 -  Symptomatic but completely ambulatory  There were no vitals filed for this visit.  GENERAL:alert, no distress, well nourished, well developed, comfortable, cooperative, obese and smiling SKIN: skin color, texture, turgor are normal, no rashes or significant lesions HEAD: Normocephalic, No masses, lesions, tenderness or abnormalities EYES: normal, Conjunctiva are pink and non-injected EARS: External ears normal OROPHARYNX:lips, buccal mucosa, and tongue normal and mucous membranes are moist  NECK: supple, trachea midline LYMPH:  no palpable lymphadenopathy BREAST:not examined LUNGS: clear to auscultation and percussion HEART: regular rate & rhythm, no murmurs, no gallops, S1 normal and S2 normal ABDOMEN:abdomen soft, non-tender, obese and normal bowel sounds BACK: Back symmetric, no curvature. EXTREMITIES:less then 2 second capillary refill, no joint deformities, effusion, or inflammation, no edema, no skin discoloration, no clubbing, no cyanosis  NEURO: alert & oriented x 3 with fluent speech, no focal motor/sensory deficits, gait normal   LABORATORY DATA: CBC    Component Value Date/Time   WBC 7.6 06/21/2011 0400   RBC 4.41 06/21/2011 0400   HGB 12.6 06/21/2011 0400   HCT 38.6 06/21/2011 0400   PLT 182 06/21/2011 0400   MCV 87.5 06/21/2011 0400   MCH 28.6 06/21/2011 0400   MCHC 32.6 06/21/2011 0400   RDW 13.4 06/21/2011 0400   LYMPHSABS 2.5 06/21/2011 0400   MONOABS 0.6 06/21/2011 0400   EOSABS 0.2 06/21/2011 0400   BASOSABS 0.0 06/21/2011 0400      Chemistry      Component Value Date/Time   NA 135 07/11/2011 0857   K 3.9 07/11/2011 0857   CL 99 07/11/2011 0857   CO2 26 07/11/2011 0857   BUN 20 07/11/2011 0857   CREATININE 1.16* 07/11/2011 0857      Component Value Date/Time   CALCIUM 9.5 07/11/2011 0857   ALKPHOS 65 07/11/2011 0857   AST 14 07/11/2011 0857   ALT 10 07/11/2011 0857   BILITOT 0.3 07/11/2011 0857        ASSESSMENT:  1. Metastatic breast cancer to bones from invasive ductal  carcinoma of the Right breast. She presented with a 7 cm tumor, extensive LVI. Tumor focally extended to the anterior surface margin, 8 of 8 lymph nodes were involved with metastatic disease. She was ER positive 100%, PR  positive 99%, HER2 neu negative, Ki-67 marker 14%. No liver involvement, no lung involvement and she had a mastectomy followed by radiation therapy to T4 through T7 due to severe involvement at this area. She was treated with Arimidex initially from 07/2009 through 03/23/2010 and was progressing by bone scan criteria. We switched her to tamoxifen which she is tolerating well and her bone scan is stable. She has not received radiation therapy to the chest wall or axilla.  2. 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  3. Obesity.  4. Diabetes mellitus.  5. History of depression on therapy.  6. Hypercholesterolemia.  7. Poor dental hygiene with multiple missing teeth  8. Nausea following medication consumption  9. Hypoglycemic events in AM  10. Poor venous access   PLAN:  1. I personally reviewed  and went over laboratory results with the patient. 2. I personally reviewed and went over radiographic studies with the patient. 3. Bone scan in October 2013 4. Lab work monthly with Pamidronate (if unable to switch to [place] due to poor venous access) 5. Discussed Xgeva.  Discussed risks, benefits, alternatives, and side effects including ONJ and hypocalcemia. 6. Patient informed to take Calcium and Vitamin D at home daily.  She reports that she is already taking the aforementioned for bone health. 7. Spoke with April Manson, financial counselor, regarding [place] for this patient.  She will investigate cost.  8. Return in 3 months for follow-up.   All questions were answered. The patient knows to call the clinic with any problems, questions or concerns. We can certainly see the patient much sooner if necessary.  The patient and plan  discussed with Glenford Peers, MD and he is in agreement with the aforementioned.  Kaylee Mitchell

## 2011-07-12 ENCOUNTER — Other Ambulatory Visit (HOSPITAL_COMMUNITY): Payer: Self-pay | Admitting: Oncology

## 2011-07-21 ENCOUNTER — Encounter (HOSPITAL_COMMUNITY): Payer: Self-pay | Admitting: Emergency Medicine

## 2011-07-21 ENCOUNTER — Emergency Department (HOSPITAL_COMMUNITY)
Admission: EM | Admit: 2011-07-21 | Discharge: 2011-07-21 | Disposition: A | Discharge status: Home / Self Care | Payer: Medicaid Other | Attending: Emergency Medicine | Admitting: Emergency Medicine

## 2011-07-21 ENCOUNTER — Other Ambulatory Visit (HOSPITAL_COMMUNITY): Payer: Self-pay | Admitting: Oncology

## 2011-07-21 ENCOUNTER — Emergency Department (HOSPITAL_COMMUNITY): Payer: Medicaid Other

## 2011-07-21 DIAGNOSIS — C7951 Secondary malignant neoplasm of bone: Secondary | ICD-10-CM | POA: Insufficient documentation

## 2011-07-21 DIAGNOSIS — I10 Essential (primary) hypertension: Secondary | ICD-10-CM | POA: Insufficient documentation

## 2011-07-21 DIAGNOSIS — C7952 Secondary malignant neoplasm of bone marrow: Secondary | ICD-10-CM | POA: Insufficient documentation

## 2011-07-21 DIAGNOSIS — E785 Hyperlipidemia, unspecified: Secondary | ICD-10-CM | POA: Insufficient documentation

## 2011-07-21 DIAGNOSIS — F3289 Other specified depressive episodes: Secondary | ICD-10-CM | POA: Insufficient documentation

## 2011-07-21 DIAGNOSIS — Z7982 Long term (current) use of aspirin: Secondary | ICD-10-CM | POA: Insufficient documentation

## 2011-07-21 DIAGNOSIS — Z794 Long term (current) use of insulin: Secondary | ICD-10-CM | POA: Insufficient documentation

## 2011-07-21 DIAGNOSIS — F329 Major depressive disorder, single episode, unspecified: Secondary | ICD-10-CM | POA: Insufficient documentation

## 2011-07-21 DIAGNOSIS — Z79899 Other long term (current) drug therapy: Secondary | ICD-10-CM | POA: Insufficient documentation

## 2011-07-21 DIAGNOSIS — E119 Type 2 diabetes mellitus without complications: Secondary | ICD-10-CM | POA: Insufficient documentation

## 2011-07-21 DIAGNOSIS — M542 Cervicalgia: Secondary | ICD-10-CM | POA: Insufficient documentation

## 2011-07-21 DIAGNOSIS — M62838 Other muscle spasm: Secondary | ICD-10-CM

## 2011-07-21 DIAGNOSIS — R51 Headache: Secondary | ICD-10-CM | POA: Insufficient documentation

## 2011-07-21 MED ORDER — OXYCODONE-ACETAMINOPHEN 5-325 MG PO TABS: 1.0000 | ORAL_TABLET | ORAL | Status: AC | PRN

## 2011-07-21 MED ORDER — DIAZEPAM 5 MG PO TABS: 5.0000 mg | ORAL_TABLET | Freq: Three times a day (TID) | ORAL | Status: AC | PRN

## 2011-07-21 MED ORDER — DIAZEPAM 5 MG PO TABS
5.0000 mg | ORAL_TABLET | Freq: Once | ORAL | Status: AC
  Administered 2011-07-21: 5 mg via ORAL
  Filled 2011-07-21: qty 1

## 2011-07-21 MED ORDER — OXYCODONE-ACETAMINOPHEN 5-325 MG PO TABS
1.0000 | ORAL_TABLET | Freq: Once | ORAL | Status: AC
  Administered 2011-07-21: 1 via ORAL
  Filled 2011-07-21: qty 1

## 2011-07-21 NOTE — ED Notes (Signed)
Patient states she hit her head 3 times last Friday getting into her sister's car; denies any LOC.  Patient c/o increased pain and now states pain radiates into her neck.

## 2011-07-21 NOTE — ED Notes (Signed)
Patient states she is having sharp pain in both sides of her head for a week, states she should have come to the doctor earlier

## 2011-07-21 NOTE — ED Provider Notes (Signed)
History     CSN: [id number]  Arrival date & time 07/21/11  1951   First MD Initiated Contact with Patient 07/21/11 2024      Chief Complaint  Patient presents with  . Head Injury    (Consider location/radiation/quality/duration/timing/severity/associated sxs/prior treatment) Patient is a 56 y.o. female presenting with head injury. The history is provided by the patient.  Head Injury   She hit the left side of her head 3 times while trying to get into a car 5 days ago. She was having severe pain on that side of her head which spread to the other side of her head. Since then, pain is also spread into her neck. Pain is severe and she rates it at 9/10. Pain is worse with any kind of movement. She tried taking ibuprofen, and Flexeril with no relief. She took a Vicodin which he thinks may given her some slight temporary relief. She denies any weakness, numbness, tingling. She denies any bowel or bladder dysfunction. She denies any visual disturbance.  Past Medical History  Diagnosis Date  . Hypertension   . High cholesterol   . Knee pain, bilateral 2013  . Cancer     rt breast is primary  . Bone metastasis 09/13/2010  . Diabetes mellitus     type II  . Depression     Past Surgical History  Procedure Date  . Knee surgery     right-arthroscopy  . Mastectomy     right side  . Abdominal hysterectomy     Family History  Problem Relation Age of Onset  . Arthritis    . Cancer    . Diabetes    . Anesthesia problems Neg Hx   . Hypotension Neg Hx   . Malignant hyperthermia Neg Hx   . Pseudochol deficiency Neg Hx     History  Substance Use Topics  . Smoking status: Never Smoker   . Smokeless tobacco: Not on file  . Alcohol Use: No    OB History    Grav Para Term Preterm Abortions TAB SAB Ect Mult Living                  Review of Systems  All other systems reviewed and are negative.    Allergies  Review of patient's allergies indicates no known allergies.  Home  Medications   Current Outpatient Rx  Name Route Sig Dispense Refill  . ASPIRIN EC 81 MG PO TBEC Oral Take 81 mg by mouth daily.      . ATORVASTATIN CALCIUM 20 MG PO TABS Oral Take 20 mg by mouth daily.      Marland Kitchen CALCIUM CARBONATE-VITAMIN D 500-200 MG-UNIT PO TABS Oral Take 1 tablet by mouth daily.      Marland Kitchen CARVEDILOL 25 MG PO TABS Oral Take 25 mg by mouth daily.      Marland Kitchen HYDROCOD POLST-CPM POLST ER 10-8 MG/5ML PO LQCR Oral Take 5 mLs by mouth at bedtime as needed. 100 mL 0  . GABAPENTIN 100 MG PO CAPS Oral Take 1 capsule (100 mg total) by mouth 3 (three) times daily. 90 capsule 2  . HYDROCODONE-ACETAMINOPHEN 7.5-300 MG PO TABS Oral Take 1 tablet by mouth every 4 (four) hours as needed. 90 each 2  . INSULIN GLARGINE 100 UNIT/ML [place] SOLN Subcutaneous Inject 80 Units into the skin at bedtime.     Marland Kitchen LISINOPRIL-HYDROCHLOROTHIAZIDE 20-25 MG PO TABS Oral Take 1 tablet by mouth daily.      Marland Kitchen LORAZEPAM 0.5  MG PO TABS Oral Take 0.5 mg by mouth every 8 (eight) hours as needed. For nausea    . METFORMIN HCL 1000 MG PO TABS Oral Take 1,000 mg by mouth 2 (two) times daily with a meal.      . PREDNISONE 10 MG PO KIT Oral Take 1 kit (10 mg total) by mouth as directed. 1 kit 0    48  . SERTRALINE HCL 50 MG PO TABS Oral Take 1.5 tablets (75 mg total) by mouth daily. 45 tablet 3  . TAMOXIFEN CITRATE 20 MG PO TABS Oral Take 20 mg by mouth daily.      BP 137/74  Pulse 88  Temp 99.5 F (37.5 C) (Oral)  Resp 20  SpO2 99%  Physical Exam  Nursing note and vitals reviewed.  56 year old female who is resting comfortably and in no acute distress. Vital signs are normal. Oxygen saturation is 99% which is normal. Head is normocephalic and atraumatic. PERRLA, EOMI. Fundi show no hemorrhage, exudate, or papilledema. TMs are clear without CSF otorrhea or hemotympanum. Neck has moderate paracervical spasm which is worse on the right and moderate tenderness of the paracervical musculature. No midline tenderness is identified.  Back is nontender. Lungs are clear without rales, wheezes, rhonchi. Heart has regular rate rhythm without murmur. Abdomen is soft, flat, nontender without masses or hepatosplenomegaly. Extremities have full range of motion, no cyanosis or edema. Skin is warm and dry without rash. Neurologic: Mental status is normal, cranial nerves are intact, there are no motor or sensory deficits.  ED Course  Procedures (including critical care time)  Ct Head Wo Contrast  07/21/2011  *RADIOLOGY REPORT*  Clinical Data: Head and neck pain status post trauma.  CT HEAD WITHOUT CONTRAST,CT CERVICAL SPINE WITHOUT CONTRAST  Technique:  Contiguous axial images were obtained from the base of the skull through the vertex without contrast.,Technique: Multidetector CT imaging of the cervical spine was performed. Multiplanar CT image reconstructions were also generated.  Comparison: 09/04/2004 head CT  Findings:  Head:  There is no evidence for acute hemorrhage, hydrocephalus, mass lesion, or abnormal extra-axial fluid collection.  No definite CT evidence for acute infarction.  The visualized paranasal sinuses and mastoid air cells are predominately clear.  No displaced calvarial fracture.  Cervical spine:  Degraded by streak artifact.  Maintained craniocervical relationship.  The images of the lower cervical spine are significantly degraded by streak artifact from patient contact with the gantry.  There are sclerotic lesions within the C4- T3 vertebrae and upper ribs, in keeping with metastatic disease. No dens fracture.  Multilevel degenerative changes with osteophyte formation.  Posterior longitudinal ligament ossification at C3-4 results in mild central canal narrowing.  There is suggestion of moderate central canal narrowing of the lower cervical spine however images 05/02 degraded to better characterize.  The left lung apices are clear.  IMPRESSION: No acute intracranial abnormality.  Diffuse osseous sclerotic metastases.  Multilevel  degenerative changes.  Artifact from patient contact with the gantry significantly limits evaluation of the lower cervical spine.  Essentially nondiagnostic for a nondisplaced fracture.  No displaced fracture identified.  No acute osseous abnormality of the upper cervical spine.  Original Report Authenticated By: Waneta Martins, M.D.   Ct Cervical Spine Wo Contrast  07/21/2011  *RADIOLOGY REPORT*  Clinical Data: Head and neck pain status post trauma.  CT HEAD WITHOUT CONTRAST,CT CERVICAL SPINE WITHOUT CONTRAST  Technique:  Contiguous axial images were obtained from the base of the skull through the vertex  without contrast.,Technique: Multidetector CT imaging of the cervical spine was performed. Multiplanar CT image reconstructions were also generated.  Comparison: 09/04/2004 head CT  Findings:  Head:  There is no evidence for acute hemorrhage, hydrocephalus, mass lesion, or abnormal extra-axial fluid collection.  No definite CT evidence for acute infarction.  The visualized paranasal sinuses and mastoid air cells are predominately clear.  No displaced calvarial fracture.  Cervical spine:  Degraded by streak artifact.  Maintained craniocervical relationship.  The images of the lower cervical spine are significantly degraded by streak artifact from patient contact with the gantry.  There are sclerotic lesions within the C4- T3 vertebrae and upper ribs, in keeping with metastatic disease. No dens fracture.  Multilevel degenerative changes with osteophyte formation.  Posterior longitudinal ligament ossification at C3-4 results in mild central canal narrowing.  There is suggestion of moderate central canal narrowing of the lower cervical spine however images 05/02 degraded to better characterize.  The left lung apices are clear.  IMPRESSION: No acute intracranial abnormality.  Diffuse osseous sclerotic metastases.  Multilevel degenerative changes.  Artifact from patient contact with the gantry significantly limits  evaluation of the lower cervical spine.  Essentially nondiagnostic for a nondisplaced fracture.  No displaced fracture identified.  No acute osseous abnormality of the upper cervical spine.  Original Report Authenticated By: Waneta Martins, M.D.      1. Cervical paraspinal muscle spasm   2. Neck pain   3. Bone metastases       MDM  Head and neck pain which seemed to be mostly cervical spasm at this point. I doubt significant head injury but will get CT scan of head and cervical spine. She will be given a therapeutic trial of Percocet plus diazepam.  CT shows no acute process but evidence of osteoblastic bone metastases as disease is noted. She got significant relief from Percocet and diazepam and will be discharged with prescriptions for same.      Dione Booze, MD 07/21/11 2158

## 2011-08-08 ENCOUNTER — Other Ambulatory Visit (HOSPITAL_COMMUNITY): Payer: Medicaid Other

## 2011-08-10 ENCOUNTER — Ambulatory Visit (HOSPITAL_COMMUNITY): Payer: Medicaid Other

## 2011-08-11 ENCOUNTER — Encounter (HOSPITAL_COMMUNITY): Payer: Medicaid Other | Attending: Oncology

## 2011-08-11 DIAGNOSIS — C801 Malignant (primary) neoplasm, unspecified: Secondary | ICD-10-CM | POA: Insufficient documentation

## 2011-08-11 DIAGNOSIS — C50219 Malignant neoplasm of upper-inner quadrant of unspecified female breast: Secondary | ICD-10-CM

## 2011-08-11 DIAGNOSIS — C7951 Secondary malignant neoplasm of bone: Secondary | ICD-10-CM | POA: Insufficient documentation

## 2011-08-11 DIAGNOSIS — C50919 Malignant neoplasm of unspecified site of unspecified female breast: Secondary | ICD-10-CM | POA: Insufficient documentation

## 2011-08-11 LAB — DIFFERENTIAL
Basophils Relative: 1 % (ref 0–1)
Lymphs Abs: 2.3 10*3/uL (ref 0.7–4.0)
Monocytes Relative: 8 % (ref 3–12)
Neutro Abs: 3.1 10*3/uL (ref 1.7–7.7)
Neutrophils Relative %: 51 % (ref 43–77)

## 2011-08-11 LAB — CANCER ANTIGEN 27.29: CA 27.29: 21 U/mL (ref 0–39)

## 2011-08-11 LAB — COMPREHENSIVE METABOLIC PANEL
ALT: 9 U/L (ref 0–35)
Albumin: 3.2 g/dL — ABNORMAL LOW (ref 3.5–5.2)
Alkaline Phosphatase: 67 U/L (ref 39–117)
BUN: 19 mg/dL (ref 6–23)
Chloride: 98 mEq/L (ref 96–112)
Potassium: 3.7 mEq/L (ref 3.5–5.1)
Sodium: 136 mEq/L (ref 135–145)
Total Bilirubin: 0.2 mg/dL — ABNORMAL LOW (ref 0.3–1.2)
Total Protein: 7.2 g/dL (ref 6.0–8.3)

## 2011-08-11 LAB — CBC
Hemoglobin: 11.5 g/dL — ABNORMAL LOW (ref 12.0–15.0)
MCHC: 32.1 g/dL (ref 30.0–36.0)
Platelets: 181 10*3/uL (ref 150–400)
RBC: 4.11 MIL/uL (ref 3.87–5.11)

## 2011-08-11 NOTE — Progress Notes (Signed)
Labs drawn today for cbc/diff,cmp,[id number] 

## 2011-08-16 ENCOUNTER — Telehealth (HOSPITAL_COMMUNITY): Payer: Self-pay | Admitting: *Deleted

## 2011-08-16 ENCOUNTER — Other Ambulatory Visit (HOSPITAL_COMMUNITY): Payer: Self-pay | Admitting: Oncology

## 2011-08-16 DIAGNOSIS — C50919 Malignant neoplasm of unspecified site of unspecified female breast: Secondary | ICD-10-CM

## 2011-08-16 DIAGNOSIS — C7951 Secondary malignant neoplasm of bone: Secondary | ICD-10-CM

## 2011-08-16 MED ORDER — HYDROCODONE-ACETAMINOPHEN 7.5-300 MG PO TABS: 1.0000 | ORAL_TABLET | ORAL | Status: DC | PRN

## 2011-08-20 ENCOUNTER — Other Ambulatory Visit (HOSPITAL_COMMUNITY): Payer: Self-pay | Admitting: Oncology

## 2011-09-05 ENCOUNTER — Encounter (HOSPITAL_COMMUNITY): Payer: Medicaid Other | Attending: Oncology

## 2011-09-05 DIAGNOSIS — C7951 Secondary malignant neoplasm of bone: Secondary | ICD-10-CM | POA: Insufficient documentation

## 2011-09-05 DIAGNOSIS — C801 Malignant (primary) neoplasm, unspecified: Secondary | ICD-10-CM | POA: Insufficient documentation

## 2011-09-05 DIAGNOSIS — C50919 Malignant neoplasm of unspecified site of unspecified female breast: Secondary | ICD-10-CM | POA: Insufficient documentation

## 2011-09-05 LAB — DIFFERENTIAL
Basophils Absolute: 0 10*3/uL (ref 0.0–0.1)
Basophils Relative: 0 % (ref 0–1)
Eosinophils Absolute: 0.1 10*3/uL (ref 0.0–0.7)
Eosinophils Relative: 2 % (ref 0–5)
Monocytes Absolute: 0.4 10*3/uL (ref 0.1–1.0)
Monocytes Relative: 7 % (ref 3–12)
Neutro Abs: 3.2 10*3/uL (ref 1.7–7.7)

## 2011-09-05 LAB — BASIC METABOLIC PANEL
BUN: 25 mg/dL — ABNORMAL HIGH (ref 6–23)
Calcium: 9.4 mg/dL (ref 8.4–10.5)
Chloride: 107 mEq/L (ref 96–112)
Creatinine, Ser: 1.4 mg/dL — ABNORMAL HIGH (ref 0.50–1.10)
GFR calc Af Amer: 47 mL/min — ABNORMAL LOW (ref >90)
GFR calc non Af Amer: 41 mL/min — ABNORMAL LOW (ref >90)

## 2011-09-05 LAB — CBC
HCT: 35.4 % — ABNORMAL LOW (ref 36.0–46.0)
Hemoglobin: 11.5 g/dL — ABNORMAL LOW (ref 12.0–15.0)
MCH: 28.5 pg (ref 26.0–34.0)
MCHC: 32.5 g/dL (ref 30.0–36.0)
MCV: 87.6 fL (ref 78.0–100.0)
RDW: 13.9 % (ref 11.5–15.5)

## 2011-09-05 NOTE — Progress Notes (Signed)
Labs drawn today for cbc/diff,bmp 

## 2011-10-03 ENCOUNTER — Other Ambulatory Visit (HOSPITAL_COMMUNITY): Payer: Medicaid Other

## 2011-10-05 ENCOUNTER — Ambulatory Visit (HOSPITAL_COMMUNITY): Payer: Medicaid Other | Admitting: Oncology

## 2011-10-10 ENCOUNTER — Encounter (HOSPITAL_COMMUNITY): Payer: Medicaid Other | Attending: Oncology

## 2011-10-10 DIAGNOSIS — C801 Malignant (primary) neoplasm, unspecified: Secondary | ICD-10-CM | POA: Insufficient documentation

## 2011-10-10 DIAGNOSIS — C7951 Secondary malignant neoplasm of bone: Secondary | ICD-10-CM | POA: Insufficient documentation

## 2011-10-10 DIAGNOSIS — C50919 Malignant neoplasm of unspecified site of unspecified female breast: Secondary | ICD-10-CM

## 2011-10-10 LAB — DIFFERENTIAL
Basophils Absolute: 0 10*3/uL (ref 0.0–0.1)
Basophils Relative: 1 % (ref 0–1)
Eosinophils Relative: 2 % (ref 0–5)
Lymphocytes Relative: 32 % (ref 12–46)
Monocytes Absolute: 0.4 10*3/uL (ref 0.1–1.0)
Neutro Abs: 3.7 10*3/uL (ref 1.7–7.7)

## 2011-10-10 LAB — COMPREHENSIVE METABOLIC PANEL
Albumin: 3 g/dL — ABNORMAL LOW (ref 3.5–5.2)
Alkaline Phosphatase: 70 U/L (ref 39–117)
BUN: 21 mg/dL (ref 6–23)
Calcium: 9.5 mg/dL (ref 8.4–10.5)
Creatinine, Ser: 1.27 mg/dL — ABNORMAL HIGH (ref 0.50–1.10)
GFR calc Af Amer: 53 mL/min — ABNORMAL LOW (ref >90)
Glucose, Bld: 161 mg/dL — ABNORMAL HIGH (ref 70–99)
Potassium: 3.8 mEq/L (ref 3.5–5.1)
Total Protein: 7 g/dL (ref 6.0–8.3)

## 2011-10-10 LAB — CBC
HCT: 37.5 % (ref 36.0–46.0)
MCHC: 31.7 g/dL (ref 30.0–36.0)
Platelets: 210 10*3/uL (ref 150–400)
RDW: 14.2 % (ref 11.5–15.5)
WBC: 6.2 10*3/uL (ref 4.0–10.5)

## 2011-10-11 LAB — CANCER ANTIGEN 27.29: CA 27.29: 24 U/mL (ref 0–39)

## 2011-10-13 ENCOUNTER — Encounter (HOSPITAL_BASED_OUTPATIENT_CLINIC_OR_DEPARTMENT_OTHER): Payer: Medicaid Other | Admitting: Oncology

## 2011-10-13 VITALS — BP 150/84 | HR 88 | Temp 98.0°F | Resp 22 | Wt 309.1 lb

## 2011-10-13 DIAGNOSIS — Z170 Estrogen receptor positive status [ER+]: Secondary | ICD-10-CM

## 2011-10-13 DIAGNOSIS — C7952 Secondary malignant neoplasm of bone marrow: Secondary | ICD-10-CM

## 2011-10-13 DIAGNOSIS — C50219 Malignant neoplasm of upper-inner quadrant of unspecified female breast: Secondary | ICD-10-CM

## 2011-10-13 DIAGNOSIS — C50919 Malignant neoplasm of unspecified site of unspecified female breast: Secondary | ICD-10-CM

## 2011-10-13 DIAGNOSIS — E119 Type 2 diabetes mellitus without complications: Secondary | ICD-10-CM

## 2011-10-13 DIAGNOSIS — C7951 Secondary malignant neoplasm of bone: Secondary | ICD-10-CM

## 2011-10-13 MED ORDER — OXYCODONE-ACETAMINOPHEN 7.5-325 MG PO TABS: 1.0000 | ORAL_TABLET | Freq: Three times a day (TID) | ORAL | Status: DC | PRN

## 2011-10-13 MED ORDER — DENOSUMAB 120 MG/1.7ML ~~LOC~~ SOLN
120.0000 mg | Freq: Once | SUBCUTANEOUS | Status: AC
  Administered 2011-10-13: 120 mg via SUBCUTANEOUS
  Filled 2011-10-13: qty 1.7

## 2011-10-13 MED ORDER — OXYCODONE-ACETAMINOPHEN 5-325 MG PO TABS: 1.0000 | ORAL_TABLET | Freq: Three times a day (TID) | ORAL | Status: DC | PRN

## 2011-10-13 NOTE — Progress Notes (Deleted)
[name], MD [address] [place] [address]  1. Breast CA   2. Bone metastasis     CURRENT THERAPY: On daily Tamoxifen and IV Pamidronate  INTERVAL HISTORY: Kaylee Mitchell 56 y.o. 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 She presented with a 7 cm tumor, extensive LVI. Tumor focally extended to the anterior surface margin, 8 of 8 lymph nodes were involved with metastatic disease. She was ER positive 100%, PR positive 99%, HER2 neu negative, Ki-67 marker 14%. No liver involvement, no lung involvement and she had a mastectomy followed by radiation therapy to T4 through T7 due to severe involvement at this area. She was treated with Arimidex initially from 07/2009 through 03/23/2010 and was progressing by bone scan criteria. We switched her to tamoxifen which she is tolerating well and her bone scan is now stable. She has not received radiation therapy to the chest wall or axilla.     Past Medical History  Diagnosis Date  . Hypertension   . High cholesterol   . Knee pain, bilateral 2013  . Cancer     rt breast is primary  . Bone metastasis 09/13/2010  . Diabetes mellitus     type II  . Depression     has Breast CA; Bone metastasis; Bursitis of knee; Medial meniscus, posterior horn derangement; S/P arthroscopy of left knee; Pain in joint, lower leg; and Difficulty in walking on her problem list.      has no known allergies.  Ms. Woollard had no medications administered during this visit.  Past Surgical History  Procedure Date  . Knee surgery     right-arthroscopy  . Mastectomy     right side  . Abdominal hysterectomy     *** denies any headaches, dizziness, double vision, fevers, chills, night sweats, nausea, vomiting, diarrhea, constipation, chest pain, heart palpitations, shortness of breath, blood in stool, black tarry stool, urinary pain, urinary burning, urinary frequency,  hematuria.   PHYSICAL EXAMINATION  ECOG PERFORMANCE STATUS: {CHL ONC ECOG PS:[phone]}  Filed Vitals:   10/13/11 1400  BP: 150/84  Pulse: 88  Temp: 98 F (36.7 C)  Resp: 22    GENERAL:{CHL ONC PE [phone]} SKIN: {CHL ONC PE XBJY:[id number]} HEAD: {CHL ONC PE QMVH:8469629528} EYES: {CHL ONC PE UXLK:4401027253} EARS: {CHL ONC PE GUYQ:0347425956} OROPHARYNX:{CHL ONC PE [phone]}  NECK: {CHL ONC PE LOVF:6433295188} LYMPH:  {CHL ONC PE CZYSA:6301601093} BREAST:{CHL ONC PE [phone]} LUNGS: {CHL ONC PE ATFTD:3220254270} HEART: {CHL ONC PE WCBJS:2831517616} ABDOMEN:{CHL ONC PE ABDOMEN:[phone]} BACK: {CHL ONC PE WVPX:1062694854} EXTREMITIES:{CHL ONC PE EXTREMITIES:[phone]}  NEURO: {CHL ONC PE NEURO:[phone]} PELVIC:{CHL ONC PE [phone]} RECTAL: {CHL ONC PE RECTAL:[phone]}    LABORATORY DATA:  {*** HELP TEXT ***  This SmartLink requires parameters for processing. Parameters are variables that can be added to the original SmartLink name. This allows the user to request specific information by giving the SmartLink precise direction.  The LabLast SmartLink accepts a list of result component base names separated by commas. The user can also request the number of results to display for each component. To indicate the number of results for each component, type the component name followed by a colon and then the number of results. (Component name:4). For all results for that particular component, type a star in place of the number (Component name:*). To obtain the last result for a particular component, type the component base name without the  colon, number, or star. Ex: .LabLast[RBC. Depending on how the SmartLink is setup, results may be limited by a lookback period. In this case, any results older than the lookback period will not be displayed.  The SmartLink will display the name of the component(s) with the units used next to the  name followed by a table listing the date and the result value for each result requested.  Example: .LabLast[MCH:3,MCV:*,HCT   This example would display a table for all components whose base name matches '[facility]', 'MCV', or 'HCT'. The first would contain the last three results of each component that has a base name of [facility], the second would contain all the entered results for components with MCV as the base name, and the third would contain the last entered result for all components with a base name of HCT.}   PENDING LABS:   RADIOGRAPHIC STUDIES:  No results found.   PATHOLOGY:    ASSESSMENT: ***   PLAN: ***   All questions were answered. The patient knows to call the clinic with any problems, questions or concerns. We can certainly see the patient much sooner if necessary.  The patient and plan discussed with {CHL ONC MEDICAL ONCOLOGISTS:[phone]} and he is in agreement with the aforementioned.  I spent {CHL ONC TIME VISIT - AVWUJ:[id number]} counseling the patient face to face. The total time spent in the appointment was {CHL ONC TIME VISIT - FAOZH:[id number]}.  Kaylee Mitchell

## 2011-10-13 NOTE — Patient Instructions (Addendum)
[facility]  Discharge Instructions  RECOMMENDATIONS MADE BY THE CONSULTANT AND ANY TEST RESULTS WILL BE SENT TO YOUR REFERRING DOCTOR.   Xgeva given today. We will continue lab work and Xgeva injection every 4 weeks. We will get you scheduled for a mammogram before the end of this year. The doctor will see you again in 2 months. RX given for Percocet. Report any issues/concerns to clinic as needed.   I acknowledge that I have been informed and understand all the instructions given to me and received a copy. I do not have any more questions at this time, but understand that I may call the Specialty Clinic at [facility] at [phone] during business hours should I have any further questions or need assistance in obtaining follow-up care.    __________________________________________  _____________  __________ Signature of Patient or Authorized Representative            Date                   Time    __________________________________________ Nurse's Signature

## 2011-10-13 NOTE — Progress Notes (Signed)
Kaylee Flavin, MD [address] [place] [address]  1. Breast CA   2. Bone metastasis     CURRENT THERAPY:On daily Tamoxifen and IV Pamidronate   INTERVAL HISTORY: Kaylee Mitchell 56 y.o. 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 She presented with a 7 cm tumor, extensive LVI. Tumor focally extended to the anterior surface margin, 8 of 8 lymph nodes were involved with metastatic disease. She was ER positive 100%, PR positive 99%, HER2 neu negative, Ki-67 marker 14%. No liver involvement, no lung involvement and she had a mastectomy followed by radiation therapy to T4 through T7 due to severe involvement at this area. She was treated with Arimidex initially from 07/2009 through 03/23/2010 and was progressing by bone scan criteria. We switched her to tamoxifen which she is tolerating well and her bone scan is now stable. She has not received radiation therapy to the chest wall or axilla.   Complains of right hand pain and numbness, like a needle, in the web space of finger #4 and #5.  Comes and goes.  It feels like a needle.  Sometimes it goes away with shaking of hand.  No pain with finger manipulation.    She is taking tamoxifen every day.  She was switched from Pamidronate to [facility].  We will start that today.    She otherwise is doing well.  She has some questions regarding her pain medications.  They hydrocodone is not helping her much anymore.  She only takes it at night.  She reports that the Hydrocodone is not effective for her.  So I will give her an Rx for Oxycodone 7.5/325 mg.   Otherwise, she denies any complaints and ROS questioning is negative.     Past Medical History  Diagnosis Date  . Hypertension   . High cholesterol   . Knee pain, bilateral 2013  . Cancer     rt breast is primary  . Bone metastasis 09/13/2010  . Diabetes mellitus     type II  . Depression     has Breast CA;  Bone metastasis; Bursitis of knee; Medial meniscus, posterior horn derangement; S/P arthroscopy of left knee; Pain in joint, lower leg; and Difficulty in walking on her problem list.      has no known allergies.  [name] had no medications administered during this visit.  Past Surgical History  Procedure Date  . Knee surgery     right-arthroscopy  . Mastectomy     right side  . Abdominal hysterectomy     Denies any headaches, dizziness, double vision, fevers, chills, night sweats, nausea, vomiting, diarrhea, constipation, chest pain, heart palpitations, shortness of breath, blood in stool, black tarry stool, urinary pain, urinary burning, urinary frequency, hematuria.   PHYSICAL EXAMINATION  ECOG PERFORMANCE STATUS: 2 - Symptomatic, <50% confined to bed  Filed Vitals:   10/13/11 1400  BP: 150/84  Pulse: 88  Temp: 98 F (36.7 C)  Resp: 22    GENERAL:alert, no distress, well nourished, well developed, comfortable, cooperative, obese and smiling SKIN: skin color, texture, turgor are normal, no rashes or significant lesions HEAD: Normocephalic, No masses, lesions, tenderness or abnormalities EYES: normal, Conjunctiva are pink and non-injected EARS: External ears normal OROPHARYNX:lips, buccal mucosa, and tongue normal and mucous membranes are moist  NECK: supple, no adenopathy, no stridor, non-tender, trachea midline LYMPH:  no palpable lymphadenopathy, no hepatosplenomegaly BREAST:left breast  normal without mass, skin or nipple changes or axillary nodes, right post-mastectomy site well healed and free of suspicious changes LUNGS: clear to auscultation and percussion HEART: regular rate & rhythm, no murmurs, no gallops, S1 normal and S2 normal ABDOMEN:abdomen soft, non-tender, obese, normal bowel sounds, no masses or organomegaly and no hepatosplenomegaly BACK: Back symmetric, no curvature., No CVA tenderness EXTREMITIES:less then 2 second capillary refill, no joint  deformities, effusion, or inflammation, no edema, no skin discoloration, no clubbing, no cyanosis  NEURO: alert & oriented x 3 with fluent speech, no focal motor/sensory deficits, gait normal    LABORATORY DATA: CBC    Component Value Date/Time   WBC 6.2 10/10/2011 0851   RBC 4.24 10/10/2011 0851   HGB 11.9* 10/10/2011 0851   HCT 37.5 10/10/2011 0851   PLT 210 10/10/2011 0851   MCV 88.4 10/10/2011 0851   MCH 28.1 10/10/2011 0851   MCHC 31.7 10/10/2011 0851   RDW 14.2 10/10/2011 0851   LYMPHSABS 2.0 10/10/2011 0851   MONOABS 0.4 10/10/2011 0851   EOSABS 0.1 10/10/2011 0851   BASOSABS 0.0 10/10/2011 0851      Chemistry      Component Value Date/Time   NA 137 10/10/2011 0851   K 3.8 10/10/2011 0851   CL 101 10/10/2011 0851   CO2 27 10/10/2011 0851   BUN 21 10/10/2011 0851   CREATININE 1.27* 10/10/2011 0851      Component Value Date/Time   CALCIUM 9.5 10/10/2011 0851   ALKPHOS 70 10/10/2011 0851   AST 14 10/10/2011 0851   ALT 9 10/10/2011 0851   BILITOT 0.3 10/10/2011 0851     . Lab Results  Component Value Date   LABCA2 24 10/10/2011       ASSESSMENT:  1. 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 She presented with a 7 cm tumor, extensive LVI. Tumor focally extended to the anterior surface margin, 8 of 8 lymph nodes were involved with metastatic disease. She was ER positive 100%, PR  positive 99%, HER2 neu negative, Ki-67 marker 14%. No liver involvement, no lung involvement and she had a mastectomy followed by radiation therapy to T4 through T7 due to severe involvement at this area. She was treated with Arimidex initially from 07/2009 through 03/23/2010 and was progressing by bone scan criteria. We switched her to tamoxifen which she is tolerating well and her bone scan is stable. She has not received radiation therapy to the chest wall or axilla.  2. 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  3.  Obesity.  4. Diabetes mellitus.  5. History of depression on therapy.  6. Hypercholesterolemia.  7. Poor dental hygiene with multiple missing teeth  8. Nausea following medication consumption  9. Hypoglycemic events in AM  10. Poor venous access   PLAN:  1. I personally reviewed and went over laboratory results with the patient. 2. I personally reviewed and went over radiographic studies with the patient. 3. Bone scan as scheduled on 11/07/2011. 4. Lab work monthly: CBC diff, CMET, Ca 27.29 5. Xgeva injection today.  And then monthly.  Supoortive therapy plan built. 6. Rx for Percocet 7.5/325 mg provided today #30.  7. Schedule for screening mammogram in the next 1-2 months 8. Return in 2 months for follow-up.     All questions were answered. The patient knows to call the clinic with any problems, questions or concerns. We can certainly see the  patient much sooner if necessary.  Eleonora Peeler

## 2011-10-13 NOTE — Progress Notes (Signed)
Kaylee Mitchell presents today for injection per MD orders. Xgeva 120 mg administered SQ in right Abdomen. Administration without incident. Patient tolerated well.  

## 2011-10-14 ENCOUNTER — Ambulatory Visit (HOSPITAL_COMMUNITY): Payer: Medicaid Other | Admitting: Oncology

## 2011-11-07 ENCOUNTER — Encounter (HOSPITAL_COMMUNITY)
Admission: RE | Admit: 2011-11-07 | Discharge: 2011-11-07 | Disposition: A | Discharge status: Home / Self Care | Payer: Medicaid Other | Source: Ambulatory Visit | Attending: Oncology | Admitting: Oncology

## 2011-11-07 ENCOUNTER — Encounter (HOSPITAL_COMMUNITY): Payer: Self-pay

## 2011-11-07 DIAGNOSIS — C7952 Secondary malignant neoplasm of bone marrow: Secondary | ICD-10-CM | POA: Insufficient documentation

## 2011-11-07 DIAGNOSIS — C50919 Malignant neoplasm of unspecified site of unspecified female breast: Secondary | ICD-10-CM | POA: Insufficient documentation

## 2011-11-07 DIAGNOSIS — C7951 Secondary malignant neoplasm of bone: Secondary | ICD-10-CM | POA: Insufficient documentation

## 2011-11-07 MED ORDER — TECHNETIUM TC 99M MEDRONATE IV KIT
25.0000 | PACK | Freq: Once | INTRAVENOUS | Status: AC | PRN
  Administered 2011-11-07: 25.3 via INTRAVENOUS

## 2011-11-11 ENCOUNTER — Encounter (HOSPITAL_COMMUNITY): Payer: Medicaid Other | Attending: Oncology

## 2011-11-11 ENCOUNTER — Telehealth (HOSPITAL_COMMUNITY): Payer: Self-pay | Admitting: *Deleted

## 2011-11-11 ENCOUNTER — Encounter (HOSPITAL_BASED_OUTPATIENT_CLINIC_OR_DEPARTMENT_OTHER): Payer: Medicaid Other

## 2011-11-11 DIAGNOSIS — C7951 Secondary malignant neoplasm of bone: Secondary | ICD-10-CM | POA: Insufficient documentation

## 2011-11-11 DIAGNOSIS — C50919 Malignant neoplasm of unspecified site of unspecified female breast: Secondary | ICD-10-CM

## 2011-11-11 DIAGNOSIS — C801 Malignant (primary) neoplasm, unspecified: Secondary | ICD-10-CM | POA: Insufficient documentation

## 2011-11-11 DIAGNOSIS — C50219 Malignant neoplasm of upper-inner quadrant of unspecified female breast: Secondary | ICD-10-CM

## 2011-11-11 DIAGNOSIS — C7952 Secondary malignant neoplasm of bone marrow: Secondary | ICD-10-CM

## 2011-11-11 LAB — CBC WITH DIFFERENTIAL/PLATELET
Basophils Absolute: 0 10*3/uL (ref 0.0–0.1)
Basophils Relative: 0 % (ref 0–1)
Eosinophils Absolute: 0.2 10*3/uL (ref 0.0–0.7)
Hemoglobin: 12.4 g/dL (ref 12.0–15.0)
MCH: 28.3 pg (ref 26.0–34.0)
MCHC: 32.5 g/dL (ref 30.0–36.0)
Monocytes Absolute: 0.5 10*3/uL (ref 0.1–1.0)
Monocytes Relative: 7 % (ref 3–12)
Neutrophils Relative %: 52 % (ref 43–77)
RDW: 13.7 % (ref 11.5–15.5)

## 2011-11-11 LAB — COMPREHENSIVE METABOLIC PANEL
Albumin: 3.2 g/dL — ABNORMAL LOW (ref 3.5–5.2)
BUN: 26 mg/dL — ABNORMAL HIGH (ref 6–23)
Creatinine, Ser: 1.35 mg/dL — ABNORMAL HIGH (ref 0.50–1.10)
Potassium: 3.8 mEq/L (ref 3.5–5.1)
Total Protein: 7.4 g/dL (ref 6.0–8.3)

## 2011-11-11 MED ORDER — DENOSUMAB 120 MG/1.7ML ~~LOC~~ SOLN
120.0000 mg | Freq: Once | SUBCUTANEOUS | Status: AC
  Administered 2011-11-11: 120 mg via SUBCUTANEOUS
  Filled 2011-11-11: qty 1.7

## 2011-11-11 NOTE — Telephone Encounter (Signed)
error 

## 2011-11-11 NOTE — Progress Notes (Signed)
Labs drawn today for cbc/diff,cmp 

## 2011-11-11 NOTE — Progress Notes (Signed)
Kaylee Mitchell presents today for injection per the provider's orders.  Xgeva administered administration without incident; see MAR for injection details.  Patient tolerated procedure well and without incident.  No questions or complaints noted at this time.  

## 2011-11-14 ENCOUNTER — Ambulatory Visit (HOSPITAL_COMMUNITY)
Admission: RE | Admit: 2011-11-14 | Discharge: 2011-11-14 | Disposition: A | Discharge status: Home / Self Care | Payer: Medicaid Other | Source: Ambulatory Visit | Attending: Oncology | Admitting: Oncology

## 2011-11-14 DIAGNOSIS — C7951 Secondary malignant neoplasm of bone: Secondary | ICD-10-CM | POA: Insufficient documentation

## 2011-11-14 DIAGNOSIS — C50919 Malignant neoplasm of unspecified site of unspecified female breast: Secondary | ICD-10-CM | POA: Insufficient documentation

## 2011-11-14 DIAGNOSIS — C7952 Secondary malignant neoplasm of bone marrow: Secondary | ICD-10-CM | POA: Insufficient documentation

## 2011-12-08 ENCOUNTER — Other Ambulatory Visit (HOSPITAL_COMMUNITY): Payer: Medicaid Other

## 2011-12-08 ENCOUNTER — Ambulatory Visit (HOSPITAL_COMMUNITY): Payer: Medicaid Other

## 2011-12-12 ENCOUNTER — Encounter (HOSPITAL_COMMUNITY): Payer: Medicaid Other | Attending: Oncology

## 2011-12-12 DIAGNOSIS — C50219 Malignant neoplasm of upper-inner quadrant of unspecified female breast: Secondary | ICD-10-CM

## 2011-12-12 DIAGNOSIS — C801 Malignant (primary) neoplasm, unspecified: Secondary | ICD-10-CM | POA: Insufficient documentation

## 2011-12-12 DIAGNOSIS — C7952 Secondary malignant neoplasm of bone marrow: Secondary | ICD-10-CM | POA: Insufficient documentation

## 2011-12-12 DIAGNOSIS — F3289 Other specified depressive episodes: Secondary | ICD-10-CM | POA: Insufficient documentation

## 2011-12-12 DIAGNOSIS — C50919 Malignant neoplasm of unspecified site of unspecified female breast: Secondary | ICD-10-CM | POA: Insufficient documentation

## 2011-12-12 DIAGNOSIS — F329 Major depressive disorder, single episode, unspecified: Secondary | ICD-10-CM | POA: Insufficient documentation

## 2011-12-12 DIAGNOSIS — C7951 Secondary malignant neoplasm of bone: Secondary | ICD-10-CM | POA: Insufficient documentation

## 2011-12-12 LAB — CANCER ANTIGEN 27.29: CA 27.29: 30 U/mL (ref 0–39)

## 2011-12-12 LAB — COMPREHENSIVE METABOLIC PANEL
AST: 11 U/L (ref 0–37)
BUN: 22 mg/dL (ref 6–23)
CO2: 27 mEq/L (ref 19–32)
Chloride: 101 mEq/L (ref 96–112)
Creatinine, Ser: 1.26 mg/dL — ABNORMAL HIGH (ref 0.50–1.10)
GFR calc non Af Amer: 46 mL/min — ABNORMAL LOW (ref >90)
Total Bilirubin: 0.3 mg/dL (ref 0.3–1.2)

## 2011-12-12 LAB — CBC WITH DIFFERENTIAL/PLATELET
Basophils Absolute: 0 10*3/uL (ref 0.0–0.1)
HCT: 37.9 % (ref 36.0–46.0)
Hemoglobin: 12.2 g/dL (ref 12.0–15.0)
Lymphocytes Relative: 38 % (ref 12–46)
Lymphs Abs: 2.2 10*3/uL (ref 0.7–4.0)
Monocytes Absolute: 0.4 10*3/uL (ref 0.1–1.0)
Monocytes Relative: 7 % (ref 3–12)
Neutro Abs: 3 10*3/uL (ref 1.7–7.7)
RBC: 4.33 MIL/uL (ref 3.87–5.11)
WBC: 5.9 10*3/uL (ref 4.0–10.5)

## 2011-12-12 NOTE — Progress Notes (Signed)
Labs drawn today for cbc/diff,cmp,[id number] 

## 2011-12-14 ENCOUNTER — Encounter (HOSPITAL_BASED_OUTPATIENT_CLINIC_OR_DEPARTMENT_OTHER): Payer: Medicaid Other

## 2011-12-14 ENCOUNTER — Encounter (HOSPITAL_COMMUNITY): Payer: Self-pay | Admitting: Oncology

## 2011-12-14 ENCOUNTER — Encounter (HOSPITAL_BASED_OUTPATIENT_CLINIC_OR_DEPARTMENT_OTHER): Payer: Medicaid Other | Admitting: Oncology

## 2011-12-14 VITALS — BP 156/91 | HR 101 | Temp 97.4°F | Resp 20 | Wt 315.0 lb

## 2011-12-14 DIAGNOSIS — C50219 Malignant neoplasm of upper-inner quadrant of unspecified female breast: Secondary | ICD-10-CM

## 2011-12-14 DIAGNOSIS — F32A Depression, unspecified: Secondary | ICD-10-CM

## 2011-12-14 DIAGNOSIS — C7951 Secondary malignant neoplasm of bone: Secondary | ICD-10-CM

## 2011-12-14 DIAGNOSIS — C50919 Malignant neoplasm of unspecified site of unspecified female breast: Secondary | ICD-10-CM

## 2011-12-14 DIAGNOSIS — F4321 Adjustment disorder with depressed mood: Secondary | ICD-10-CM | POA: Insufficient documentation

## 2011-12-14 DIAGNOSIS — F329 Major depressive disorder, single episode, unspecified: Secondary | ICD-10-CM

## 2011-12-14 DIAGNOSIS — C7952 Secondary malignant neoplasm of bone marrow: Secondary | ICD-10-CM

## 2011-12-14 HISTORY — DX: Depression, unspecified: F32.A

## 2011-12-14 MED ORDER — TAMOXIFEN CITRATE 20 MG PO TABS: 20.0000 mg | ORAL_TABLET | Freq: Every day | ORAL | Status: DC

## 2011-12-14 MED ORDER — CITALOPRAM HYDROBROMIDE 20 MG PO TABS: 20.0000 mg | ORAL_TABLET | Freq: Every day | ORAL | Status: DC

## 2011-12-14 MED ORDER — DENOSUMAB 120 MG/1.7ML ~~LOC~~ SOLN
120.0000 mg | Freq: Once | SUBCUTANEOUS | Status: AC
  Administered 2011-12-14: 120 mg via SUBCUTANEOUS
  Filled 2011-12-14: qty 1.7

## 2011-12-14 NOTE — Progress Notes (Signed)
Cullen L Strupp presents today for injection per MD orders. Xgeva 120 mg administered SQ in left Abdomen. Administration without incident. Patient tolerated well.  

## 2011-12-14 NOTE — Progress Notes (Signed)
[name], MD [address] [place] [address]  1. Breast CA   2. Bone metastasis     CURRENT THERAPY:On daily Tamoxifen and Xgeva every 4 weeks.   INTERVAL HISTORY: Kaylee Mitchell 56 y.o. 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 She presented with a 7 cm tumor, extensive LVI. Tumor focally extended to the anterior surface margin, 8 of 8 lymph nodes were involved with metastatic disease. She was ER positive 100%, PR positive 99%, HER2 neu negative, Ki-67 marker 14%. No liver involvement, no lung involvement and she had a mastectomy followed by radiation therapy to T4 through T7 due to severe involvement at this area. She was treated with Arimidex initially from 07/2009 through 03/23/2010 and was progressing by bone scan criteria. We switched her to tamoxifen which she is tolerating well and her bone scan is now stable. She has not received radiation therapy to the chest wall or axilla.    Kaylee Mitchell is doing well.  She reports increased depression and she finds herself crying at home.  Unfortunately, Kaylee Mitchell has some personal stressors in her life.  She reports that she recently moved into her mother's house.  Her mother passed away 6-7 years ago.  After her mother's passing, her family members stopped paying taxes on the house.  So now that Kaylee Mitchell has moved in, she has backjed taxes to pay on the house.  She reports that she makes $1024 per month.  Her taxes she has to pay are $582 per month and then she pays $160 per month for a loan she had to take out to help a portion of the houses backed taxes.  That leaves her little money every month.  Fortunately, the [facility] is going to pay her electric bill to help her during the holiday season.  As a result of her lack of monies, she has not been taking her Zoloft because she ran out 1 week ago and could not afford to have it refilled.  She reports that she  has a medical deductible to meet.  So I looked through the [facility] $4/10 list and I will switch her to Celexa 20 mg daily, 90 day supply.  I e-scribed this.  Her pain is well controlled.  She feels well otherwise.   I personally reviewed and went over laboratory results with the patient.  Her CA 27.29 remains WNL.  I personally reviewed and went over radiographic studies with the patient.  Bone scan in October was stable.  We will perform a bone scan every 4 months.  Her mammogram was BI-RADS CATEGORY 1 as well.   Complete ROS questioning is negative.   Past Medical History  Diagnosis Date  . Hypertension   . High cholesterol   . Knee pain, bilateral 2013  . Cancer     rt breast is primary  . Bone metastasis 09/13/2010  . Diabetes mellitus     type II  . Depression     has Breast CA; Bone metastasis; Bursitis of knee; Medial meniscus, posterior horn derangement; S/P arthroscopy of left knee; Pain in joint, lower leg; and Difficulty in walking on her problem list.      has no known allergies.  Kaylee Mitchell had no medications administered during this visit.  Past Surgical History  Procedure Date  . Knee surgery     right-arthroscopy  . Mastectomy     right  side  . Abdominal hysterectomy     Denies any headaches, dizziness, double vision, fevers, chills, night sweats, nausea, vomiting, diarrhea, constipation, chest pain, heart palpitations, shortness of breath, blood in stool, black tarry stool, urinary pain, urinary burning, urinary frequency, hematuria.   PHYSICAL EXAMINATION  ECOG PERFORMANCE STATUS: 1 - Symptomatic but completely ambulatory  Filed Vitals:   12/14/11 0900  BP: 156/91  Pulse: 101  Temp: 97.4 F (36.3 C)  Resp: 20    [name], MD  [address]  [place] [address]  1.  Breast CA   2.  Bone metastasis   CURRENT THERAPY:On daily Tamoxifen and IV Pamidronate  INTERVAL HISTORY:  Kaylee Mitchell 56 y.o. 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 She presented with a 7 cm tumor, extensive LVI. Tumor focally extended to the anterior surface margin, 8 of 8 lymph nodes were involved with metastatic disease. She was ER positive 100%, PR positive 99%, HER2 neu negative, Ki-67 marker 14%. No liver involvement, no lung involvement and she had a mastectomy followed by radiation therapy to T4 through T7 due to severe involvement at this area. She was treated with Arimidex initially from 07/2009 through 03/23/2010 and was progressing by bone scan criteria. We switched her to tamoxifen which she is tolerating well and her bone scan is now stable. She has not received radiation therapy to the chest wall or axilla.  Complains of right hand pain and numbness, like a needle, in the web space of finger #4 and #5. Comes and goes. It feels like a needle. Sometimes it goes away with shaking of hand. No pain with finger manipulation.  She is taking tamoxifen every day. She was switched from Pamidronate to [facility]. We will start that today.  She otherwise is doing well. She has some questions regarding her pain medications. They hydrocodone is not helping her much anymore. She only takes it at night. She reports that the Hydrocodone is not effective for her. So I will give her an Rx for Oxycodone 7.5/325 mg.  Otherwise, she denies any complaints and ROS questioning is negative.  Past Medical History   Diagnosis  Date   .  Hypertension    .  High cholesterol    .  Knee pain, bilateral  2013   .  Cancer      rt breast is primary   .  Bone metastasis  09/13/2010   .  Diabetes mellitus      type II   .  Depression    has Breast CA; Bone metastasis; Bursitis of knee; Medial meniscus, posterior horn derangement; S/P arthroscopy of left knee; Pain in joint, lower leg; and Difficulty in walking on her problem list.  has no known allergies.  Kaylee Mitchell had no medications administered during  this visit.  Past Surgical History   Procedure  Date   .  Knee surgery      right-arthroscopy   .  Mastectomy      right side   .  Abdominal hysterectomy    Denies any headaches, dizziness, double vision, fevers, chills, night sweats, nausea, vomiting, diarrhea, constipation, chest pain, heart palpitations, shortness of breath, blood in stool, black tarry stool, urinary pain, urinary burning, urinary frequency, hematuria.  PHYSICAL EXAMINATION  ECOG PERFORMANCE STATUS: 2 - Symptomatic, <50% confined to bed  Filed Vitals:    10/13/11 1400   BP:  150/84   Pulse:  88   Temp:  98 F (36.7 C)   Resp:  22   GENERAL:alert, no distress, well nourished, well developed, comfortable, cooperative, obese and smiling  SKIN: skin color, texture, turgor are normal, no rashes or significant lesions  HEAD: Normocephalic, No masses, lesions, tenderness or abnormalities  EYES: normal, Conjunctiva are pink and non-injected  EARS: External ears normal  OROPHARYNX:lips, buccal mucosa, and tongue normal and mucous membranes are moist  NECK: supple, no adenopathy, no stridor, non-tender, trachea midline  LYMPH: no palpable lymphadenopathy, no hepatosplenomegaly  BREAST: not examined LUNGS: clear to auscultation and percussion  HEART: regular rate & rhythm, no murmurs, no gallops, S1 normal and S2 normal  ABDOMEN:abdomen soft, non-tender, obese, normal bowel sounds, no masses or organomegaly and no hepatosplenomegaly  BACK: Back symmetric, no curvature., No CVA tenderness  EXTREMITIES:less then 2 second capillary refill, no joint deformities, effusion, or inflammation, no edema, no skin discoloration, no clubbing, no cyanosis  NEURO: alert & oriented x 3 with fluent speech, no focal motor/sensory deficits, gait normal    LABORATORY DATA: CBC    Component Value Date/Time   WBC 5.9 12/12/2011 0900   RBC 4.33 12/12/2011 0900   HGB 12.2 12/12/2011 0900   HCT 37.9 12/12/2011 0900   PLT 194 12/12/2011  0900   MCV 87.5 12/12/2011 0900   MCH 28.2 12/12/2011 0900   MCHC 32.2 12/12/2011 0900   RDW 13.6 12/12/2011 0900   LYMPHSABS 2.2 12/12/2011 0900   MONOABS 0.4 12/12/2011 0900   EOSABS 0.2 12/12/2011 0900   BASOSABS 0.0 12/12/2011 0900      Chemistry      Component Value Date/Time   NA 138 12/12/2011 0900   K 4.0 12/12/2011 0900   CL 101 12/12/2011 0900   CO2 27 12/12/2011 0900   BUN 22 12/12/2011 0900   CREATININE 1.26* 12/12/2011 0900      Component Value Date/Time   CALCIUM 9.8 12/12/2011 0900   ALKPHOS 72 12/12/2011 0900   AST 11 12/12/2011 0900   ALT 10 12/12/2011 0900   BILITOT 0.3 12/12/2011 0900        RADIOGRAPHIC STUDIES:  11/14/2011  *RADIOLOGY REPORT*  Clinical Data: Screening.  LEFT DIGITAL SCREENING MAMMOGRAM WITH CAD  Comparison: None.  Findings: The patient has had a right mastectomy. Views of the  left breast demonstrate heterogeneously dense tissue . No  suspicious masses, architectural distortion, or calcifications are  present.  Images were processed with CAD.  IMPRESSION:  No evidence of malignancy. Screening mammography is recommended in  one year.  RECOMMENDATION:  Screening mammogram in one year. (Code:SM-B-01Y)  BI-RADS CATEGORY 1: Negative.  Original Report Authenticated By: Rolla Plate, M.D.    11/07/2011 *RADIOLOGY REPORT*  Clinical Data: Metastatic breast cancer  NUCLEAR MEDICINE WHOLE BODY BONE SCINTIGRAPHY  Technique: Whole body anterior and posterior images were obtained  approximately 3 hours after intravenous injection of  radiopharmaceutical.  Radiopharmaceutical: 25.3MILLI CURIE TC-MDP TECHNETIUM TC 28M  MEDRONATE IV KIT  Comparison: 04/20/2011  Radiographic correlation: CT head 07/21/2011  Findings:  Foci of abnormal increased uptake are identified in the ribs,  thoracic and lumbar spine, posterior pelvis and left humeral  diaphysis.  These sites were seen on the previous exam and remain most  compatible  with metastatic disease.  Additional focus of increased uptake is identified at the posterior  aspect the calvarium, also suspicious for osseous metastasis.  Uptake at the [facility] joints and feet, typically degenerative.  Photopenic  defect anterior right chest corresponding to a known  prosthesis.  No additional sites of abnormal osseous tracer accumulation  identified.  Stable urinary tract and soft tissue distribution of tracer  otherwise seen.  IMPRESSION:  Scattered sites of increased osseous tracer localization most  consistent with metastatic disease.  Original Report Authenticated By: Lollie Marrow, M.D.     ASSESSMENT:  1. 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 She presented with a 7 cm tumor, extensive LVI. Tumor focally extended to the anterior surface margin, 8 of 8 lymph nodes were involved with metastatic disease. She was ER positive 100%, PR  positive 99%, HER2 neu negative, Ki-67 marker 14%. No liver involvement, no lung involvement and she had a mastectomy followed by radiation therapy to T4 through T7 due to severe involvement at this area. She was treated with Arimidex initially from 07/2009 through 03/23/2010 and was progressing by bone scan criteria. We switched her to tamoxifen which she is tolerating well and her bone scan is stable. She has not received radiation therapy to the chest wall or axilla.  2. 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  3. Obesity.  4. Diabetes mellitus.  5. History of depression on therapy.  6. Hypercholesterolemia.  7. Poor dental hygiene with multiple missing teeth  8. Nausea following medication consumption  9. Poor venous access   PLAN:  1. I personally reviewed and went over laboratory results with the patient. 2. I personally reviewed and went over radiographic studies with the patient. 3. Bone scan every 4 months for surveillance.  Bone  scan ordered for Feb 2014. 4. Labs every 4 weeks with Xgeva: CBC diff, CMET 5. Continue with PO Tamoxifen daily as prescribed 6. Continue with Xgeva every 4 weeks.  7. D/C Zoloft due to cost 8. E-scribed Celexa 20 mg to [facility].  Take 1 tablet daily.  This medication is on their $4/10 plan. 9. E-scribed a 90 day supply of Tamoxifen to [facility] 10. Return as scheduled for Xgeva 11. Return in 8 weeks for follow-up.   All questions were answered. The patient knows to call the clinic with any problems, questions or concerns. We can certainly see the patient much sooner if necessary.  The patient and plan discussed with Glenford Peers, MD and he is in agreement with the aforementioned.  Kaylee Mitchell

## 2011-12-14 NOTE — Patient Instructions (Signed)
[facility]  Discharge Instructions  RECOMMENDATIONS MADE BY THE CONSULTANT AND ANY TEST RESULTS WILL BE SENT TO YOUR REFERRING DOCTOR.    SPECIAL INSTRUCTIONS/FOLLOW-UP: Bone scan needed in February and Return to Clinic in 8 weeks.  Continue taking the Tamoxifan and Xgeva as ordered.  Please call us with any concern.     I acknowledge that I have been informed and understand all the instructions given to me and received a copy. I do not have any more questions at this time, but understand that I may call the Specialty Clinic at [facility] at [phone] during business hours should I have any further questions or need assistance in obtaining follow-up care.    __________________________________________  _____________  __________ Signature of Patient or Authorized Representative            Date                   Time    __________________________________________ Nurse's Signature

## 2011-12-26 ENCOUNTER — Other Ambulatory Visit (HOSPITAL_COMMUNITY): Payer: Self-pay | Admitting: Oncology

## 2011-12-26 DIAGNOSIS — C7951 Secondary malignant neoplasm of bone: Secondary | ICD-10-CM

## 2011-12-26 DIAGNOSIS — C50919 Malignant neoplasm of unspecified site of unspecified female breast: Secondary | ICD-10-CM

## 2011-12-26 MED ORDER — OXYCODONE-ACETAMINOPHEN 7.5-325 MG PO TABS: 1.0000 | ORAL_TABLET | Freq: Three times a day (TID) | ORAL | Status: DC | PRN

## 2012-01-04 ENCOUNTER — Encounter (HOSPITAL_COMMUNITY): Payer: Medicaid Other | Attending: Oncology

## 2012-01-04 ENCOUNTER — Encounter (HOSPITAL_BASED_OUTPATIENT_CLINIC_OR_DEPARTMENT_OTHER): Payer: Medicaid Other

## 2012-01-04 VITALS — BP 136/74 | HR 75 | Resp 20

## 2012-01-04 DIAGNOSIS — C801 Malignant (primary) neoplasm, unspecified: Secondary | ICD-10-CM | POA: Insufficient documentation

## 2012-01-04 DIAGNOSIS — F3289 Other specified depressive episodes: Secondary | ICD-10-CM | POA: Insufficient documentation

## 2012-01-04 DIAGNOSIS — C50219 Malignant neoplasm of upper-inner quadrant of unspecified female breast: Secondary | ICD-10-CM

## 2012-01-04 DIAGNOSIS — C7951 Secondary malignant neoplasm of bone: Secondary | ICD-10-CM | POA: Diagnosis not present

## 2012-01-04 DIAGNOSIS — F329 Major depressive disorder, single episode, unspecified: Secondary | ICD-10-CM

## 2012-01-04 DIAGNOSIS — C7952 Secondary malignant neoplasm of bone marrow: Secondary | ICD-10-CM

## 2012-01-04 DIAGNOSIS — C50919 Malignant neoplasm of unspecified site of unspecified female breast: Secondary | ICD-10-CM

## 2012-01-04 LAB — COMPREHENSIVE METABOLIC PANEL
ALT: 11 U/L (ref 0–35)
AST: 13 U/L (ref 0–37)
Albumin: 3.1 g/dL — ABNORMAL LOW (ref 3.5–5.2)
Alkaline Phosphatase: 63 U/L (ref 39–117)
Glucose, Bld: 126 mg/dL — ABNORMAL HIGH (ref 70–99)
Potassium: 3.7 mEq/L (ref 3.5–5.1)
Sodium: 141 mEq/L (ref 135–145)
Total Protein: 6.9 g/dL (ref 6.0–8.3)

## 2012-01-04 LAB — CBC WITH DIFFERENTIAL/PLATELET
Basophils Relative: 1 % (ref 0–1)
Eosinophils Absolute: 0.1 10*3/uL (ref 0.0–0.7)
Lymphs Abs: 2.5 10*3/uL (ref 0.7–4.0)
MCH: 28.4 pg (ref 26.0–34.0)
Neutrophils Relative %: 50 % (ref 43–77)
Platelets: 200 10*3/uL (ref 150–400)
RBC: 4.23 MIL/uL (ref 3.87–5.11)

## 2012-01-04 MED ORDER — DENOSUMAB 120 MG/1.7ML ~~LOC~~ SOLN
120.0000 mg | Freq: Once | SUBCUTANEOUS | Status: AC
  Administered 2012-01-04: 120 mg via SUBCUTANEOUS
  Filled 2012-01-04: qty 1.7

## 2012-01-04 NOTE — Progress Notes (Signed)
Labs drawn today for cbc/diff,cmp,[id number] 

## 2012-01-04 NOTE — Progress Notes (Signed)
Kaylee Mitchell presents today for injection per MD orders. xgeva 120mg administered SQ in left Abdomen. Administration without incident. Patient tolerated well.  

## 2012-01-12 ENCOUNTER — Ambulatory Visit (INDEPENDENT_AMBULATORY_CARE_PROVIDER_SITE_OTHER): Payer: Medicare Other | Admitting: Family Medicine

## 2012-01-12 ENCOUNTER — Encounter: Payer: Self-pay | Admitting: Family Medicine

## 2012-01-12 VITALS — BP 130/80 | HR 78 | Resp 16 | Ht 65.0 in | Wt 314.4 lb

## 2012-01-12 DIAGNOSIS — F32A Depression, unspecified: Secondary | ICD-10-CM

## 2012-01-12 DIAGNOSIS — E785 Hyperlipidemia, unspecified: Secondary | ICD-10-CM | POA: Diagnosis not present

## 2012-01-12 DIAGNOSIS — E1122 Type 2 diabetes mellitus with diabetic chronic kidney disease: Secondary | ICD-10-CM | POA: Insufficient documentation

## 2012-01-12 DIAGNOSIS — E119 Type 2 diabetes mellitus without complications: Secondary | ICD-10-CM | POA: Diagnosis not present

## 2012-01-12 DIAGNOSIS — I10 Essential (primary) hypertension: Secondary | ICD-10-CM

## 2012-01-12 DIAGNOSIS — F3289 Other specified depressive episodes: Secondary | ICD-10-CM

## 2012-01-12 DIAGNOSIS — R262 Difficulty in walking, not elsewhere classified: Secondary | ICD-10-CM | POA: Diagnosis not present

## 2012-01-12 DIAGNOSIS — C50919 Malignant neoplasm of unspecified site of unspecified female breast: Secondary | ICD-10-CM

## 2012-01-12 DIAGNOSIS — N189 Chronic kidney disease, unspecified: Secondary | ICD-10-CM

## 2012-01-12 DIAGNOSIS — F329 Major depressive disorder, single episode, unspecified: Secondary | ICD-10-CM

## 2012-01-12 LAB — LIPID PANEL
Cholesterol: 134 mg/dL (ref 0–200)
Total CHOL/HDL Ratio: 3.7 Ratio
Triglycerides: 113 mg/dL (ref <150)
VLDL: 23 mg/dL (ref 0–40)

## 2012-01-12 NOTE — Assessment & Plan Note (Signed)
Continue lipitor check FLP

## 2012-01-12 NOTE — Assessment & Plan Note (Signed)
Continue current meds, BP looks good today

## 2012-01-12 NOTE — Patient Instructions (Signed)
Get the labs done today  Continue current medications We will refer you for medication assistance  New referral to foot doctor (Dr. Ulice Brilliant)  F/U 3 months

## 2012-01-12 NOTE — Assessment & Plan Note (Signed)
Check A1C, she is on large dose of insulin, may need insulin with meal times but getting medication is difficult for her She will be set up with medication assistance program to see if she qualifies for help with her insulin and other meds

## 2012-01-12 NOTE — Progress Notes (Signed)
  Subjective:    Patient ID: Kaylee Mitchell, female    DOB: Sep 27, 1955, 56 y.o.   MRN: [id number]  HPI Pt here to establish care, previous PCP [facility] Oncology- Dr. Mariel Sleet Medications and history reviewed Pt did not bring any meds with her today Metastatic breast cancer. She was diagnosed in May of 2011. She is metastatic cancer to her bones. She has a bone scan every 3 months is currently on infusions monthly and tamoxifen. She also has pain control Percocet by her oncologist and is doing well for her. Diabetes mellitus diagnosed greater than 10 years ago she's currently on 80 of Lantus she also takes metformin and glipizide she's not aware of her A1c but states her blood sugars are 200 to 300s. She has difficulty getting her medications and a local church recently purchased her insulin for her. She's not on an ACE inhibitor which I see him secondary to her CKD Chronic left leg pain she is status post arthroscopic by Dr. Jenelle Mages earlier this year she's gained significant amount of weight and is unable to exercise secondary to pain in her weight gain. Depression long-term history of depression she was previously on Zoloft however cannot afford this due to the cost and was changed to citalopram by her oncologist. She states she is taking this she still has a lot of anxiety as her grandchildren and on her nerves and she is trying to get the finances together with her mothers home which she recently moved into with her nephew who she raises her own after her sister died from leukemia at age 33. Her nephew was recently laid off therefore her only income is her disability check.   Review of Systems   GEN- denies fatigue, fever, weight loss,weakness, recent illness HEENT- denies eye drainage, change in vision, nasal discharge, CVS- denies chest pain, palpitations RESP- denies SOB, cough, wheeze ABD- denies N/V, change in stools, abd pain GU- denies dysuria, hematuria, dribbling, incontinence MSK-  +joint pain, muscle aches, injury Neuro- denies headache, dizziness, syncope, seizure activity      Objective:   Physical Exam  GEN- NAD, alert and oriented x3, obese HEENT- PERRL, EOMI, non injected sclera, pink conjunctiva, MMM, oropharynx clear Neck- Supple,  CVS- RRR, no murmur RESP-CTAB ABS-NABS,soft,NT,ND EXT- No edema Pulses- Radial, DP- 2+ Psych- normal affect and mood Feet- no open lesions, thickened nails, normal monofilament , +callus      Assessment & Plan:

## 2012-01-12 NOTE — Assessment & Plan Note (Signed)
Cr has ranged 1.25-1.4 , GFR in  40's

## 2012-01-12 NOTE — Assessment & Plan Note (Signed)
Breast cancer with bony metastasis. She's followed by oncology I reviewed the last oncology noted. Overall she has been stable over the past year.

## 2012-01-12 NOTE — Assessment & Plan Note (Signed)
Will continue citalopram at this time. We'll adjust medications as needed

## 2012-01-12 NOTE — Assessment & Plan Note (Signed)
She's difficulty walking secondary to arthritis and previous meniscal tear in her left knee. Her obesity also contributes to this. She is on chronic pain medication

## 2012-01-31 ENCOUNTER — Telehealth: Payer: Self-pay | Admitting: Family Medicine

## 2012-01-31 ENCOUNTER — Telehealth (HOSPITAL_COMMUNITY): Payer: Self-pay | Admitting: Oncology

## 2012-01-31 NOTE — Telephone Encounter (Signed)
Patient aware that all meds recently prescribed have come from Dr. Thornton Papas office.  She was instructed to call their office.

## 2012-01-31 NOTE — Telephone Encounter (Signed)
Hold citalopram

## 2012-01-31 NOTE — Telephone Encounter (Signed)
Patient notified to hold citalopram and to let us know if itching is not resolved.  Verbalized understanding.

## 2012-02-10 ENCOUNTER — Ambulatory Visit (INDEPENDENT_AMBULATORY_CARE_PROVIDER_SITE_OTHER): Payer: Medicare Other | Admitting: Family Medicine

## 2012-02-10 ENCOUNTER — Encounter (HOSPITAL_COMMUNITY): Payer: Medicare Other | Attending: Oncology | Admitting: Oncology

## 2012-02-10 ENCOUNTER — Encounter: Payer: Self-pay | Admitting: Family Medicine

## 2012-02-10 ENCOUNTER — Encounter (HOSPITAL_BASED_OUTPATIENT_CLINIC_OR_DEPARTMENT_OTHER): Payer: Medicare Other

## 2012-02-10 ENCOUNTER — Telehealth: Payer: Self-pay | Admitting: Family Medicine

## 2012-02-10 VITALS — BP 153/91 | HR 83 | Temp 98.0°F | Resp 18 | Wt 313.4 lb

## 2012-02-10 VITALS — BP 138/80 | HR 86 | Resp 16 | Wt 312.0 lb

## 2012-02-10 DIAGNOSIS — E119 Type 2 diabetes mellitus without complications: Secondary | ICD-10-CM

## 2012-02-10 DIAGNOSIS — C50919 Malignant neoplasm of unspecified site of unspecified female breast: Secondary | ICD-10-CM | POA: Diagnosis not present

## 2012-02-10 DIAGNOSIS — C7951 Secondary malignant neoplasm of bone: Secondary | ICD-10-CM

## 2012-02-10 DIAGNOSIS — F3289 Other specified depressive episodes: Secondary | ICD-10-CM

## 2012-02-10 DIAGNOSIS — C50219 Malignant neoplasm of upper-inner quadrant of unspecified female breast: Secondary | ICD-10-CM | POA: Diagnosis not present

## 2012-02-10 DIAGNOSIS — F329 Major depressive disorder, single episode, unspecified: Secondary | ICD-10-CM

## 2012-02-10 DIAGNOSIS — C801 Malignant (primary) neoplasm, unspecified: Secondary | ICD-10-CM | POA: Insufficient documentation

## 2012-02-10 DIAGNOSIS — C7952 Secondary malignant neoplasm of bone marrow: Secondary | ICD-10-CM | POA: Diagnosis not present

## 2012-02-10 DIAGNOSIS — I10 Essential (primary) hypertension: Secondary | ICD-10-CM | POA: Diagnosis not present

## 2012-02-10 DIAGNOSIS — B360 Pityriasis versicolor: Secondary | ICD-10-CM

## 2012-02-10 DIAGNOSIS — F32A Depression, unspecified: Secondary | ICD-10-CM

## 2012-02-10 LAB — CBC WITH DIFFERENTIAL/PLATELET
Basophils Absolute: 0 10*3/uL (ref 0.0–0.1)
Basophils Relative: 0 % (ref 0–1)
Eosinophils Relative: 2 % (ref 0–5)
HCT: 38.5 % (ref 36.0–46.0)
Hemoglobin: 12.6 g/dL (ref 12.0–15.0)
Lymphocytes Relative: 35 % (ref 12–46)
MCHC: 32.7 g/dL (ref 30.0–36.0)
Monocytes Absolute: 0.3 10*3/uL (ref 0.1–1.0)
Monocytes Relative: 6 % (ref 3–12)
RDW: 13.9 % (ref 11.5–15.5)

## 2012-02-10 LAB — COMPREHENSIVE METABOLIC PANEL
AST: 12 U/L (ref 0–37)
BUN: 20 mg/dL (ref 6–23)
CO2: 28 mEq/L (ref 19–32)
Calcium: 9.4 mg/dL (ref 8.4–10.5)
Creatinine, Ser: 1.19 mg/dL — ABNORMAL HIGH (ref 0.50–1.10)
GFR calc non Af Amer: 49 mL/min — ABNORMAL LOW (ref >90)

## 2012-02-10 MED ORDER — IBUPROFEN 800 MG PO TABS: 800.0000 mg | ORAL_TABLET | Freq: Every evening | ORAL | Status: DC | PRN

## 2012-02-10 MED ORDER — SERTRALINE HCL 100 MG PO TABS: 100.0000 mg | ORAL_TABLET | Freq: Every day | ORAL | Status: DC

## 2012-02-10 MED ORDER — GABAPENTIN 100 MG PO CAPS: 300.0000 mg | ORAL_CAPSULE | Freq: Every day | ORAL | Status: DC

## 2012-02-10 MED ORDER — DENOSUMAB 120 MG/1.7ML ~~LOC~~ SOLN
120.0000 mg | Freq: Once | SUBCUTANEOUS | Status: AC
  Administered 2012-02-10: 120 mg via SUBCUTANEOUS
  Filled 2012-02-10: qty 1.7

## 2012-02-10 MED ORDER — FLUCONAZOLE 100 MG PO TABS: ORAL_TABLET | ORAL | Status: DC

## 2012-02-10 MED ORDER — SERTRALINE HCL 50 MG PO TABS: 75.0000 mg | ORAL_TABLET | Freq: Every day | ORAL | Status: DC

## 2012-02-10 NOTE — Progress Notes (Signed)
Kaylee Antis, MD [address], [address] [place] [address]  1. Breast CA   2. Bone metastasis     CURRENT THERAPY: On Tamoxifen daily and Xgeva every 4 weeks  INTERVAL HISTORY: Kaylee Mitchell 57 y.o. 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 She presented with a 7 cm tumor, extensive LVI. Tumor focally extended to the anterior surface margin, 8 of 8 lymph nodes were involved with metastatic disease. She was ER positive 100%, PR positive 99%, HER2 neu negative, Ki-67 marker 14%. No liver involvement, no lung involvement and she had a mastectomy followed by radiation therapy to T4 through T7 due to severe involvement at this area. She was treated with Arimidex initially from 07/2009 through 03/23/2010 and was progressing by bone scan criteria. We switched her to tamoxifen which she is tolerating well and her bone scan is now stable. She has not received radiation therapy to the chest wall or axilla.   I personally reviewed and went over laboratory results with the patient.  Her labs are just over 54 weeks old so we will get some today.  I personally reviewed and went over radiographic studies with the patient. Her bone scan is stable and her mammogram in October was BIRADS 1.   Kaylee Mitchell is doing well.  She complains of itching which was initially thought to be secondary to newly started Celexa.  She was asked to D/C this medication.  She has now been off the medication for nearly three weeks the patient reports.  She explains that she continues to itch.  On further examination, the patient has tinea versicolor.  Since she is symptomatic, will treat her with Diflucan.  She denies any changes in soaps, detergents, shampoos, etc.  She needs a refill on a few of her Rxs and I will escribe that.    She has an appointment today with Kaylee Mitchell for glucose control.  I will ascertain a Hgb A1c since we need  to get other lab work as well for her Xgeva injection.  She is scheduled for a Bone scan in Feb.  She is tolerating Tamoxifen well.   Oncologic ROS questioning is otherwise negative.   Past Medical History  Diagnosis Date  . Hypertension   . High cholesterol   . Knee pain, bilateral 2013  . Cancer     rt breast is primary  . Bone metastasis 09/13/2010  . Diabetes mellitus     type II  . Depression   . Depression 12/14/2011    has Breast CA; Bone metastasis; Medial meniscus, posterior horn derangement; S/P arthroscopy of left knee; Difficulty in walking; Depression; Type II or unspecified type diabetes mellitus without mention of complication, not stated as uncontrolled; CKD (chronic kidney disease); Hyperlipidemia; Essential hypertension, benign; and Morbid obesity on her problem list.     is allergic to celexa.  Kaylee Mitchell does not currently have medications on file.  Past Surgical History  Procedure Date  . Knee surgery     right-arthroscopy  . Mastectomy     right side  . Abdominal hysterectomy     Denies any headaches, dizziness, double vision, fevers, chills, night sweats, nausea, vomiting, diarrhea, constipation, chest pain, heart palpitations, shortness of breath, blood in stool, black tarry stool, urinary pain, urinary burning, urinary frequency, hematuria.   PHYSICAL EXAMINATION  ECOG PERFORMANCE STATUS: 2 - Symptomatic, <50% confined to bed  Filed Vitals:   02/10/12 0855  BP: 153/91  Pulse: 83  Temp: 98 F (36.7 C)  Resp: 18    GENERAL:alert, no distress, well nourished, well developed, comfortable, cooperative, obese and smiling  SKIN: skin color, texture, turgor are normal, no rashes or significant lesions.  Hyperpigmented, dry, and thickened skin noted on back, chest , and arms.   HEAD: Normocephalic, No masses, lesions, tenderness or abnormalities  EYES: normal, Conjunctiva are pink and non-injected  EARS: External ears normal  OROPHARYNX:lips, buccal  mucosa, and tongue normal and mucous membranes are moist  NECK: supple, no adenopathy, no stridor, non-tender, trachea midline  LYMPH: no palpable lymphadenopathy, no hepatosplenomegaly  BREAST: not examined LUNGS: clear to auscultation and percussion  HEART: regular rate & rhythm, no murmurs, no gallops, S1 normal and S2 normal  ABDOMEN:abdomen soft, non-tender, obese, normal bowel sounds, no masses or organomegaly and no hepatosplenomegaly  BACK: Back symmetric, no curvature., No CVA tenderness  EXTREMITIES:less then 2 second capillary refill, no joint deformities, effusion, or inflammation, no edema, no skin discoloration, no clubbing, no cyanosis  NEURO: alert & oriented x 3 with fluent speech, no focal motor/sensory deficits, gait normal    LABORATORY DATA: CBC    Component Value Date/Time   WBC 6.3 01/04/2012 0830   RBC 4.23 01/04/2012 0830   HGB 12.0 01/04/2012 0830   HCT 37.2 01/04/2012 0830   PLT 200 01/04/2012 0830   MCV 87.9 01/04/2012 0830   MCH 28.4 01/04/2012 0830   MCHC 32.3 01/04/2012 0830   RDW 13.8 01/04/2012 0830   LYMPHSABS 2.5 01/04/2012 0830   MONOABS 0.5 01/04/2012 0830   EOSABS 0.1 01/04/2012 0830   BASOSABS 0.0 01/04/2012 0830      Chemistry      Component Value Date/Time   NA 141 01/04/2012 0830   K 3.7 01/04/2012 0830   CL 104 01/04/2012 0830   CO2 29 01/04/2012 0830   BUN 22 01/04/2012 0830   CREATININE 1.41* 01/04/2012 0830      Component Value Date/Time   CALCIUM 9.2 01/04/2012 0830   ALKPHOS 63 01/04/2012 0830   AST 13 01/04/2012 0830   ALT 11 01/04/2012 0830   BILITOT 0.2* 01/04/2012 0830        ASSESSMENT:  1. 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 She presented with a 7 cm tumor, extensive LVI. Tumor focally extended to the anterior surface margin, 8 of 8 lymph nodes were involved with metastatic disease. She was ER positive 100%, PR  positive 99%, HER2 neu negative, Ki-67 marker  14%. No liver involvement, no lung involvement and she had a mastectomy followed by radiation therapy to T4 through T7 due to severe involvement at this area. She was treated with Arimidex initially from 07/2009 through 03/23/2010 and was progressing by bone scan criteria. We switched her to tamoxifen which she is tolerating well and her bone scan is stable. She has not received radiation therapy to the chest wall or axilla.  2. 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  3. Obesity.  4. Diabetes mellitus.  5. History of depression on therapy.  6. Hypercholesterolemia.  7. Poor dental hygiene with multiple missing teeth  8. Nausea following medication consumption  9. Hypoglycemic events in AM  10. Poor venous access 11. Tinea versicolor with itching   PLAN:  1. I personally reviewed and went over laboratory results with the patient. 2. I  personally reviewed and went over radiographic studies with the patient. 3. Bone scan is scheduled for 03/05/12 4. Xgeva every 4 weeks.  Last injection on 01/04/2012. 5. Xgeva today. 6. Rx for Diflucan 100 mg x 14 days, take 200 mg on Day 1 escribed. 7. Labs today: CBC diff, CMET, CA 27.29, Hgb A1c 8. Labs every 4 weeks: CBC diff, CMET, CA 27.29 9. Patient education regarding tinea versicolor. 10. Refills on Gabapentin, Ibuprofen, Zoloft.  11. Return in 3 months for follow-up.   All questions were answered. The patient knows to call the clinic with any problems, questions or concerns. We can certainly see the patient much sooner if necessary.  The patient and plan discussed with Glenford Peers, MD and he is in agreement with the aforementioned. Patient seen and examined by Dr. Glenford Peers as well.   Kaylee Mitchell

## 2012-02-10 NOTE — Progress Notes (Signed)
Tolerated xgeva injection to lower left abdomen well.

## 2012-02-10 NOTE — Telephone Encounter (Signed)
Patient is aware 

## 2012-02-10 NOTE — Patient Instructions (Addendum)
.  [facility] Discharge Instructions  RECOMMENDATIONS MADE BY THE CONSULTANT AND ANY TEST RESULTS WILL BE SENT TO YOUR REFERRING PHYSICIAN.  EXAM FINDINGS BY THE PHYSICIAN TODAY AND SIGNS OR SYMPTOMS TO REPORT TO CLINIC OR PRIMARY PHYSICIAN: Exam good Tom thinks that your skin is a fungal infection  MEDICATIONS PRESCRIBED:  Medications escribed  INSTRUCTIONS GIVEN AND DISCUSSED: Labs and xgeva every 28 days  SPECIAL INSTRUCTIONS/FOLLOW-UP: See Tom in 3 months  Thank you for choosing [facility] to provide your oncology and hematology care.  To afford each patient quality time with our providers, please arrive at least 15 minutes before your scheduled appointment time.  With your help, our goal is to use those 15 minutes to complete the necessary work-up to ensure our physicians have the information they need to help with your evaluation and healthcare recommendations.    Effective January 1st, 2014, we ask that you re-schedule your appointment with our physicians should you arrive 10 or more minutes late for your appointment.  We strive to give you quality time with our providers, and arriving late affects you and other patients whose appointments are after yours.    Again, thank you for choosing [facility].  Our hope is that these requests will decrease the amount of time that you wait before being seen by our physicians.       _____________________________________________________________  Should you have questions after your visit to [facility] please contact our office at [phone] between the hours of 8:30 a.m. and 5:00 p.m.  Voicemails left after 4:30 p.m. will not be returned until the following business day.  For prescription refill requests, have your pharmacy contact our office with your prescription refill request.

## 2012-02-10 NOTE — Patient Instructions (Addendum)
Continue insulin at 45 units twice a day  [facility] referral Take medicine for rash ZOloft 100mg  daily F/U 3 months

## 2012-02-11 ENCOUNTER — Encounter: Payer: Self-pay | Admitting: Family Medicine

## 2012-02-11 DIAGNOSIS — B360 Pityriasis versicolor: Secondary | ICD-10-CM | POA: Insufficient documentation

## 2012-02-11 NOTE — Assessment & Plan Note (Signed)
Dilfucan prescribed, agree pt to start today

## 2012-02-11 NOTE — Assessment & Plan Note (Signed)
She wishes to switch back to her zoloft, 100mg  [facility]

## 2012-02-11 NOTE — Assessment & Plan Note (Signed)
Well controlled 

## 2012-02-11 NOTE — Progress Notes (Signed)
  Subjective:    Patient ID: Kaylee Mitchell, female    DOB: 03/18/1955, 57 y.o.   MRN: [id number]  HPI  Pt here for intermin visit for DM, A1C elevated at 11%, she has been taking Lantus 45 units BID as directed. No blood sugar log with her today, states Fasting CBG now 90-140. Denies hypoglycemia. Taking metformin and glipizide as prescribed. Seen by her oncologist this morning. Diagnosed with tinea versicolor and prescribed diflucan for itchy rash for the past few weeks. Today she tells me she was not taking celexa it made her itch and she was restarted on zoloft, she was taking 75mg  and needs to increase, she gets upset and anxious with her son who is 78 and still lives with her, finance are very difficult, at times she does not sleep well  Review of Systems - per above   GEN- denies fatigue, fever, weight loss,weakness, recent illness HEENT- denies eye drainage, change in vision, nasal discharge, CVS- denies chest pain, palpitations RESP- denies SOB, cough, wheeze Neuro- denies headache, dizziness, syncope, seizure activity      Objective:   Physical Exam  GEN-NAD,alert and oriented x3 HEENT-PERRL,EOMI, Oropharynx clear, MMM CVS-RRR, no murmur RESP-CTAB Skin- large patchy areas of hyperpigmentation slightly raised dry skin on chest, back and arms Psych- normal affect and mood     Assessment & Plan:

## 2012-02-11 NOTE — Assessment & Plan Note (Addendum)
The difficulty will be getting patient to be complaint with insulin, especially with her fiances, she has had uncontrolled DM for many years. Continue lantus at current dose, unfortunately I can not verify the fasting blood sugars, she has only been taking insulin on steady basis for about 4 weeks She will need ACEI, podiatry Will get [facility] to assess

## 2012-03-05 ENCOUNTER — Encounter (HOSPITAL_COMMUNITY): Payer: Self-pay

## 2012-03-05 ENCOUNTER — Encounter (HOSPITAL_COMMUNITY)
Admission: RE | Admit: 2012-03-05 | Discharge: 2012-03-05 | Disposition: A | Discharge status: Home / Self Care | Payer: Medicare Other | Source: Ambulatory Visit | Attending: Oncology | Admitting: Oncology

## 2012-03-05 DIAGNOSIS — C7951 Secondary malignant neoplasm of bone: Secondary | ICD-10-CM | POA: Insufficient documentation

## 2012-03-05 DIAGNOSIS — C7952 Secondary malignant neoplasm of bone marrow: Secondary | ICD-10-CM | POA: Diagnosis not present

## 2012-03-05 DIAGNOSIS — C801 Malignant (primary) neoplasm, unspecified: Secondary | ICD-10-CM | POA: Diagnosis not present

## 2012-03-05 DIAGNOSIS — C50919 Malignant neoplasm of unspecified site of unspecified female breast: Secondary | ICD-10-CM | POA: Insufficient documentation

## 2012-03-05 DIAGNOSIS — F329 Major depressive disorder, single episode, unspecified: Secondary | ICD-10-CM

## 2012-03-05 MED ORDER — TECHNETIUM TC 99M MEDRONATE IV KIT
25.0000 | PACK | Freq: Once | INTRAVENOUS | Status: AC | PRN
  Administered 2012-03-05: 26 via INTRAVENOUS

## 2012-03-06 DIAGNOSIS — E1149 Type 2 diabetes mellitus with other diabetic neurological complication: Secondary | ICD-10-CM | POA: Diagnosis not present

## 2012-03-06 DIAGNOSIS — L851 Acquired keratosis [keratoderma] palmaris et plantaris: Secondary | ICD-10-CM | POA: Diagnosis not present

## 2012-03-06 DIAGNOSIS — B351 Tinea unguium: Secondary | ICD-10-CM | POA: Diagnosis not present

## 2012-03-09 ENCOUNTER — Encounter (HOSPITAL_BASED_OUTPATIENT_CLINIC_OR_DEPARTMENT_OTHER): Payer: Medicare Other

## 2012-03-09 ENCOUNTER — Encounter (HOSPITAL_COMMUNITY): Payer: Medicare Other | Attending: Oncology

## 2012-03-09 VITALS — BP 140/82 | HR 95

## 2012-03-09 DIAGNOSIS — F3289 Other specified depressive episodes: Secondary | ICD-10-CM | POA: Insufficient documentation

## 2012-03-09 DIAGNOSIS — C7951 Secondary malignant neoplasm of bone: Secondary | ICD-10-CM | POA: Diagnosis not present

## 2012-03-09 DIAGNOSIS — C801 Malignant (primary) neoplasm, unspecified: Secondary | ICD-10-CM | POA: Insufficient documentation

## 2012-03-09 DIAGNOSIS — C50219 Malignant neoplasm of upper-inner quadrant of unspecified female breast: Secondary | ICD-10-CM

## 2012-03-09 DIAGNOSIS — F329 Major depressive disorder, single episode, unspecified: Secondary | ICD-10-CM | POA: Diagnosis not present

## 2012-03-09 DIAGNOSIS — C50919 Malignant neoplasm of unspecified site of unspecified female breast: Secondary | ICD-10-CM | POA: Insufficient documentation

## 2012-03-09 LAB — COMPREHENSIVE METABOLIC PANEL
Albumin: 3.2 g/dL — ABNORMAL LOW (ref 3.5–5.2)
Alkaline Phosphatase: 80 U/L (ref 39–117)
BUN: 28 mg/dL — ABNORMAL HIGH (ref 6–23)
Creatinine, Ser: 1.42 mg/dL — ABNORMAL HIGH (ref 0.50–1.10)
Potassium: 4.6 mEq/L (ref 3.5–5.1)
Total Protein: 7.1 g/dL (ref 6.0–8.3)

## 2012-03-09 LAB — CBC WITH DIFFERENTIAL/PLATELET
Basophils Absolute: 0 10*3/uL (ref 0.0–0.1)
Basophils Relative: 1 % (ref 0–1)
Eosinophils Absolute: 0.2 10*3/uL (ref 0.0–0.7)
Hemoglobin: 12.5 g/dL (ref 12.0–15.0)
MCH: 28.1 pg (ref 26.0–34.0)
MCHC: 32.5 g/dL (ref 30.0–36.0)
Monocytes Relative: 9 % (ref 3–12)
Neutrophils Relative %: 42 % — ABNORMAL LOW (ref 43–77)
Platelets: 177 10*3/uL (ref 150–400)
RDW: 14.4 % (ref 11.5–15.5)

## 2012-03-09 MED ORDER — DENOSUMAB 120 MG/1.7ML ~~LOC~~ SOLN
120.0000 mg | Freq: Once | SUBCUTANEOUS | Status: AC
  Administered 2012-03-09: 120 mg via SUBCUTANEOUS
  Filled 2012-03-09: qty 1.7

## 2012-03-09 NOTE — Progress Notes (Signed)
Labs drawn today for cbc/diff,cmp,[id number] 

## 2012-03-09 NOTE — Progress Notes (Signed)
Kaylee Mitchell presents today for injection per MD orders. Xgeva 120 mg administered SQ in left Abdomen. Administration without incident. Patient tolerated well.  

## 2012-03-12 ENCOUNTER — Encounter (HOSPITAL_COMMUNITY): Payer: Self-pay | Admitting: Oncology

## 2012-03-12 ENCOUNTER — Other Ambulatory Visit (HOSPITAL_COMMUNITY): Payer: Self-pay | Admitting: Oncology

## 2012-03-26 ENCOUNTER — Other Ambulatory Visit (HOSPITAL_COMMUNITY): Payer: Self-pay | Admitting: Oncology

## 2012-03-26 DIAGNOSIS — C50919 Malignant neoplasm of unspecified site of unspecified female breast: Secondary | ICD-10-CM

## 2012-03-26 DIAGNOSIS — C7951 Secondary malignant neoplasm of bone: Secondary | ICD-10-CM

## 2012-03-26 MED ORDER — OXYCODONE-ACETAMINOPHEN 7.5-325 MG PO TABS: 1.0000 | ORAL_TABLET | Freq: Three times a day (TID) | ORAL | Status: DC | PRN

## 2012-03-27 ENCOUNTER — Other Ambulatory Visit: Payer: Self-pay

## 2012-03-27 MED ORDER — ATORVASTATIN CALCIUM 20 MG PO TABS: 20.0000 mg | ORAL_TABLET | Freq: Every day | ORAL | Status: DC

## 2012-04-06 ENCOUNTER — Other Ambulatory Visit (HOSPITAL_COMMUNITY): Payer: Self-pay | Admitting: Oncology

## 2012-04-06 ENCOUNTER — Encounter (HOSPITAL_BASED_OUTPATIENT_CLINIC_OR_DEPARTMENT_OTHER): Payer: Medicare Other

## 2012-04-06 ENCOUNTER — Encounter (HOSPITAL_COMMUNITY): Payer: Medicare Other | Attending: Oncology

## 2012-04-06 VITALS — BP 146/83 | HR 84 | Temp 97.6°F | Resp 18

## 2012-04-06 DIAGNOSIS — F329 Major depressive disorder, single episode, unspecified: Secondary | ICD-10-CM | POA: Diagnosis not present

## 2012-04-06 DIAGNOSIS — C7951 Secondary malignant neoplasm of bone: Secondary | ICD-10-CM | POA: Diagnosis not present

## 2012-04-06 DIAGNOSIS — F3289 Other specified depressive episodes: Secondary | ICD-10-CM | POA: Insufficient documentation

## 2012-04-06 DIAGNOSIS — C50219 Malignant neoplasm of upper-inner quadrant of unspecified female breast: Secondary | ICD-10-CM

## 2012-04-06 DIAGNOSIS — C7952 Secondary malignant neoplasm of bone marrow: Secondary | ICD-10-CM

## 2012-04-06 DIAGNOSIS — C801 Malignant (primary) neoplasm, unspecified: Secondary | ICD-10-CM | POA: Diagnosis not present

## 2012-04-06 DIAGNOSIS — C50919 Malignant neoplasm of unspecified site of unspecified female breast: Secondary | ICD-10-CM

## 2012-04-06 LAB — COMPREHENSIVE METABOLIC PANEL
BUN: 28 mg/dL — ABNORMAL HIGH (ref 6–23)
CO2: 27 mEq/L (ref 19–32)
Chloride: 103 mEq/L (ref 96–112)
Creatinine, Ser: 1.45 mg/dL — ABNORMAL HIGH (ref 0.50–1.10)
GFR calc non Af Amer: 39 mL/min — ABNORMAL LOW (ref >90)
Glucose, Bld: 235 mg/dL — ABNORMAL HIGH (ref 70–99)
Total Bilirubin: 0.3 mg/dL (ref 0.3–1.2)

## 2012-04-06 LAB — CBC WITH DIFFERENTIAL/PLATELET
HCT: 36.1 % (ref 36.0–46.0)
Hemoglobin: 11.9 g/dL — ABNORMAL LOW (ref 12.0–15.0)
Lymphocytes Relative: 41 % (ref 12–46)
Monocytes Absolute: 0.5 10*3/uL (ref 0.1–1.0)
Monocytes Relative: 9 % (ref 3–12)
Neutro Abs: 2.8 10*3/uL (ref 1.7–7.7)
WBC: 5.8 10*3/uL (ref 4.0–10.5)

## 2012-04-06 LAB — CANCER ANTIGEN 27.29: CA 27.29: 19 U/mL (ref 0–39)

## 2012-04-06 MED ORDER — DENOSUMAB 120 MG/1.7ML ~~LOC~~ SOLN
120.0000 mg | Freq: Once | SUBCUTANEOUS | Status: AC
  Administered 2012-04-06: 120 mg via SUBCUTANEOUS
  Filled 2012-04-06: qty 1.7

## 2012-04-06 NOTE — Progress Notes (Signed)
Berkley Harvey presents today for injection per MD orders. Xgeva administered SQ in right Abdomen. Administration without incident. Patient tolerated well.

## 2012-04-06 NOTE — Progress Notes (Signed)
Labs drawn today for [id number],cbc/diff,cmp 

## 2012-04-12 ENCOUNTER — Ambulatory Visit: Payer: Medicare Other | Admitting: Family Medicine

## 2012-04-16 ENCOUNTER — Encounter: Payer: Self-pay | Admitting: Family Medicine

## 2012-04-16 ENCOUNTER — Ambulatory Visit (INDEPENDENT_AMBULATORY_CARE_PROVIDER_SITE_OTHER): Payer: Medicare Other | Admitting: Family Medicine

## 2012-04-16 VITALS — BP 140/80 | HR 99 | Resp 15 | Wt 316.0 lb

## 2012-04-16 DIAGNOSIS — E1165 Type 2 diabetes mellitus with hyperglycemia: Secondary | ICD-10-CM

## 2012-04-16 DIAGNOSIS — N189 Chronic kidney disease, unspecified: Secondary | ICD-10-CM | POA: Diagnosis not present

## 2012-04-16 DIAGNOSIS — I10 Essential (primary) hypertension: Secondary | ICD-10-CM | POA: Diagnosis not present

## 2012-04-16 DIAGNOSIS — E119 Type 2 diabetes mellitus without complications: Secondary | ICD-10-CM

## 2012-04-16 DIAGNOSIS — IMO0001 Reserved for inherently not codable concepts without codable children: Secondary | ICD-10-CM | POA: Diagnosis not present

## 2012-04-16 DIAGNOSIS — IMO0002 Reserved for concepts with insufficient information to code with codable children: Secondary | ICD-10-CM

## 2012-04-16 MED ORDER — INSULIN LISPRO 100 UNIT/ML ~~LOC~~ SOLN: SUBCUTANEOUS | Status: DC

## 2012-04-16 NOTE — Patient Instructions (Addendum)
Get the lab done before our next visit in 4 weeks Start humalog- inject 5 units with lunch and dinner Lantus 45 units twice a day Take your blood sugar three times a day- in the morning, before lunch and before dinner and write it down F/U 4 weeks for diabetes - bring your meter

## 2012-04-18 ENCOUNTER — Encounter: Payer: Self-pay | Admitting: Family Medicine

## 2012-04-18 NOTE — Progress Notes (Signed)
  Subjective:    Patient ID: Kaylee Mitchell, female    DOB: 09/01/1955, 57 y.o.   MRN: [id number]  HPI  Pt here to f/u diabetes and blood pressure. She has lantus but sugars have been elevated when she takes them. Notes they are high with her meals as well. Her finances are still a problem. She has been contacted by [facility]. No hypoglyemia, A1C 11.5%. She has been out of metformin. Taking glipizide  Review of Systems  GEN- denies fatigue, fever, weight loss,weakness, recent illness HEENT- denies eye drainage, change in vision, nasal discharge, CVS- denies chest pain, palpitations RESP- denies SOB, cough, wheeze ABD- denies N/V, change in stools, abd pain GU- denies dysuria, hematuria, dribbling, incontinence MSK- denies joint pain, muscle aches, injury Neuro- denies headache, dizziness, syncope, seizure activity      Objective:   Physical Exam GEN- NAD, alert and oriented x3 HEENT- PERRL, EOMI, non injected sclera, pink conjunctiva, MMM, oropharynx clear CVS- RRR, no murmur RESP-CTAB EXT- No edema, no lesions on feet Pulses- Radial, DP- 2+        Assessment & Plan:

## 2012-04-18 NOTE — Assessment & Plan Note (Signed)
Uncontrolled, D/C metformin due to CKD stage III Add short acting insulin with lunch and dinner as she rarely eats breakfast Continue lantus F/U 4 weeks,  I would like to get endocrine involved but she is already in tight spot financially , await [facility] help in home

## 2012-04-18 NOTE — Assessment & Plan Note (Signed)
No change to meds, new goal for DM < 140/80

## 2012-04-18 NOTE — Assessment & Plan Note (Signed)
Stage III based on GFR due to HTN , DM uncontrolled

## 2012-04-20 ENCOUNTER — Telehealth: Payer: Self-pay | Admitting: Family Medicine

## 2012-04-20 NOTE — Telephone Encounter (Signed)
Noted  

## 2012-04-23 ENCOUNTER — Telehealth: Payer: Self-pay | Admitting: Family Medicine

## 2012-04-23 ENCOUNTER — Other Ambulatory Visit: Payer: Self-pay

## 2012-04-23 DIAGNOSIS — C7951 Secondary malignant neoplasm of bone: Secondary | ICD-10-CM

## 2012-04-23 MED ORDER — ATENOLOL-CHLORTHALIDONE 50-25 MG PO TABS: 1.0000 | ORAL_TABLET | Freq: Every day | ORAL | Status: DC

## 2012-04-23 NOTE — Telephone Encounter (Signed)
Med refilled.

## 2012-04-23 NOTE — Telephone Encounter (Signed)
Irving Shows is trying to get in touch with patient

## 2012-05-03 ENCOUNTER — Encounter (HOSPITAL_COMMUNITY): Payer: Medicare Other | Attending: Oncology

## 2012-05-03 ENCOUNTER — Other Ambulatory Visit (HOSPITAL_COMMUNITY): Payer: Self-pay | Admitting: Oncology

## 2012-05-03 ENCOUNTER — Encounter (HOSPITAL_BASED_OUTPATIENT_CLINIC_OR_DEPARTMENT_OTHER): Payer: Medicare Other

## 2012-05-03 VITALS — BP 144/74 | HR 90

## 2012-05-03 DIAGNOSIS — C7951 Secondary malignant neoplasm of bone: Secondary | ICD-10-CM | POA: Diagnosis not present

## 2012-05-03 DIAGNOSIS — C50919 Malignant neoplasm of unspecified site of unspecified female breast: Secondary | ICD-10-CM

## 2012-05-03 DIAGNOSIS — C801 Malignant (primary) neoplasm, unspecified: Secondary | ICD-10-CM | POA: Insufficient documentation

## 2012-05-03 DIAGNOSIS — C50219 Malignant neoplasm of upper-inner quadrant of unspecified female breast: Secondary | ICD-10-CM

## 2012-05-03 DIAGNOSIS — F329 Major depressive disorder, single episode, unspecified: Secondary | ICD-10-CM | POA: Diagnosis not present

## 2012-05-03 DIAGNOSIS — F3289 Other specified depressive episodes: Secondary | ICD-10-CM | POA: Insufficient documentation

## 2012-05-03 LAB — COMPREHENSIVE METABOLIC PANEL
ALT: 12 U/L (ref 0–35)
Calcium: 9.8 mg/dL (ref 8.4–10.5)
Creatinine, Ser: 1.19 mg/dL — ABNORMAL HIGH (ref 0.50–1.10)
GFR calc Af Amer: 57 mL/min — ABNORMAL LOW (ref >90)
Glucose, Bld: 238 mg/dL — ABNORMAL HIGH (ref 70–99)
Sodium: 138 mEq/L (ref 135–145)
Total Protein: 7.4 g/dL (ref 6.0–8.3)

## 2012-05-03 LAB — HEMOGLOBIN A1C
Hgb A1c MFr Bld: 10.1 % — ABNORMAL HIGH (ref <5.7)
Mean Plasma Glucose: 243 mg/dL — ABNORMAL HIGH (ref <117)

## 2012-05-03 LAB — CBC WITH DIFFERENTIAL/PLATELET
Eosinophils Absolute: 0.2 10*3/uL (ref 0.0–0.7)
Eosinophils Relative: 3 % (ref 0–5)
Lymphs Abs: 2.3 10*3/uL (ref 0.7–4.0)
MCH: 28.1 pg (ref 26.0–34.0)
MCV: 86.5 fL (ref 78.0–100.0)
Platelets: 191 10*3/uL (ref 150–400)
RBC: 4.37 MIL/uL (ref 3.87–5.11)
RDW: 14.4 % (ref 11.5–15.5)

## 2012-05-03 MED ORDER — OXYCODONE-ACETAMINOPHEN 7.5-325 MG PO TABS: 1.0000 | ORAL_TABLET | Freq: Three times a day (TID) | ORAL | Status: DC | PRN

## 2012-05-03 MED ORDER — DENOSUMAB 120 MG/1.7ML ~~LOC~~ SOLN
120.0000 mg | Freq: Once | SUBCUTANEOUS | Status: AC
  Administered 2012-05-03: 120 mg via SUBCUTANEOUS
  Filled 2012-05-03: qty 1.7

## 2012-05-03 NOTE — Progress Notes (Signed)
Xgeva 120 mg given sub-q lower left abd.  Tolerated well.

## 2012-05-03 NOTE — Progress Notes (Signed)
Labs drawn today for cbc/diff,cmp,[id number] 

## 2012-05-04 ENCOUNTER — Other Ambulatory Visit (HOSPITAL_COMMUNITY): Payer: Medicare Other

## 2012-05-04 ENCOUNTER — Ambulatory Visit (HOSPITAL_COMMUNITY): Payer: Medicare Other

## 2012-05-07 ENCOUNTER — Encounter: Payer: Self-pay | Admitting: Family Medicine

## 2012-05-08 ENCOUNTER — Telehealth: Payer: Self-pay | Admitting: Family Medicine

## 2012-05-08 ENCOUNTER — Encounter (HOSPITAL_BASED_OUTPATIENT_CLINIC_OR_DEPARTMENT_OTHER): Payer: Medicare Other | Admitting: Oncology

## 2012-05-08 ENCOUNTER — Encounter (HOSPITAL_COMMUNITY): Payer: Self-pay | Admitting: Oncology

## 2012-05-08 VITALS — BP 131/87 | HR 87 | Temp 97.9°F | Resp 18 | Wt 309.0 lb

## 2012-05-08 DIAGNOSIS — C7951 Secondary malignant neoplasm of bone: Secondary | ICD-10-CM

## 2012-05-08 DIAGNOSIS — E119 Type 2 diabetes mellitus without complications: Secondary | ICD-10-CM

## 2012-05-08 DIAGNOSIS — C50919 Malignant neoplasm of unspecified site of unspecified female breast: Secondary | ICD-10-CM

## 2012-05-08 DIAGNOSIS — C50219 Malignant neoplasm of upper-inner quadrant of unspecified female breast: Secondary | ICD-10-CM | POA: Diagnosis not present

## 2012-05-08 DIAGNOSIS — Z170 Estrogen receptor positive status [ER+]: Secondary | ICD-10-CM

## 2012-05-08 NOTE — Patient Instructions (Addendum)
[facility] Discharge Instructions  RECOMMENDATIONS MADE BY THE CONSULTANT AND ANY TEST RESULTS WILL BE SENT TO YOUR REFERRING PHYSICIAN.  Continue all medications as prescribed. We will continue monthly lab work along with Xgeva injections. MD appointment again in 3 months. Bone scan again in August 2014. Report any issues/concerns to clinic as needed.  Thank you for choosing [facility] to provide your oncology and hematology care.  To afford each patient quality time with our providers, please arrive at least 15 minutes before your scheduled appointment time.  With your help, our goal is to use those 15 minutes to complete the necessary work-up to ensure our physicians have the information they need to help with your evaluation and healthcare recommendations.    Effective January 1st, 2014, we ask that you re-schedule your appointment with our physicians should you arrive 10 or more minutes late for your appointment.  We strive to give you quality time with our providers, and arriving late affects you and other patients whose appointments are after yours.    Again, thank you for choosing [facility].  Our hope is that these requests will decrease the amount of time that you wait before being seen by our physicians.       _____________________________________________________________  Should you have questions after your visit to [facility], please contact our office at [phone] between the hours of 8:30 a.m. and 5:00 p.m.  Voicemails left after 4:30 p.m. will not be returned until the following business day.  For prescription refill requests, have your pharmacy contact our office with your prescription refill request.

## 2012-05-08 NOTE — Telephone Encounter (Signed)
noted 

## 2012-05-08 NOTE — Progress Notes (Signed)
[name], MD [address], [address] [place] [address]  Bone metastasis - Plan: CBC with Differential, Comprehensive metabolic panel, Cancer antigen 27.29, NM Bone Scan Whole Body  Breast CA, unspecified laterality - Plan: CBC with Differential, Comprehensive metabolic panel, Cancer antigen 27.29, NM Bone Scan Whole Body  CURRENT THERAPY:On Tamoxifen daily and Xgeva every 4 weeks   INTERVAL HISTORY: Kaylee Mitchell 57 y.o. 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 She presented with a 7 cm tumor, extensive LVI. Tumor focally extended to the anterior surface margin, 8 of 8 lymph nodes were involved with metastatic disease. She was ER positive 100%, PR positive 99%, HER2 neu negative, Ki-67 marker 14%. No liver involvement, no lung involvement and she had a mastectomy followed by radiation therapy to T4 through T7 due to severe involvement at this area. She was treated with Arimidex initially from 07/2009 through 03/23/2010 and was progressing by bone scan criteria. We switched her to tamoxifen which she is tolerating well and her bone scan is now stable. She has not received radiation therapy to the chest wall or axilla.   I personally reviewed and went over radiographic studies with the patient. Bone scan from February of 2014 showed stability of bone metastases with possible improvement. We'll repeat this in August which will be 6 months from the last bone scan.  I personally reviewed and went over laboratory results with the patient.  Labs are reviewed and CBC is within normal limits with hyperglycemia at 238, renal insufficiency with BUN of 21 creatinine 1.19 and CA 27. 29 is within normal limits and stable at 26.  Teniya reports that she feels well today. Oncologically, she denies any complaints and ROS questioning is negative.  She is working with her primary care physician, Dr. Jeanice Lim,  in glucose control. The patient reports that she has an appointment with a diabetic nutritionist/dietitian today. I've encouraged Arpita to continue to work on her diabetes control.  We will continue with current treatment plan consisting of tamoxifen daily, Xgeva every 4 weeks, laboratory work every 4 weeks, and bone scan every 6 months.    Past Medical History  Diagnosis Date  . Hypertension   . High cholesterol   . Knee pain, bilateral 2013  . Cancer     rt breast is primary  . Bone metastasis 09/13/2010  . Diabetes mellitus     type II  . Depression   . Depression 12/14/2011    has Breast CA; Bone metastasis; Medial meniscus, posterior horn derangement; S/P arthroscopy of left knee; Difficulty in walking; Depression; Type II or unspecified type diabetes mellitus without mention of complication, not stated as uncontrolled; CKD (chronic kidney disease); Hyperlipidemia; Essential hypertension, benign; Morbid obesity; and Tinea versicolor on her problem list.     has No Known Allergies.  Ms. Stirn does not currently have medications on file.  Past Surgical History  Procedure Laterality Date  . Knee surgery      right-arthroscopy  . Mastectomy      right side  . Abdominal hysterectomy      Denies any headaches, dizziness, double vision, fevers, chills, night sweats, nausea, vomiting, diarrhea, constipation, chest pain, heart palpitations, shortness of breath, blood in stool, black tarry stool, urinary pain, urinary burning, urinary frequency, hematuria.   PHYSICAL EXAMINATION  ECOG PERFORMANCE STATUS: 1 - Symptomatic but completely ambulatory  Filed Vitals:   05/08/12  0800  BP: 131/87  Pulse: 87  Temp: 97.9 F (36.6 C)  Resp: 18    GENERAL:alert, no distress, well nourished, well developed, comfortable, cooperative and smiling SKIN: skin color, texture, turgor are normal, no rashes or significant lesions HEAD: Normocephalic, No masses, lesions, tenderness or  abnormalities EYES: normal, Conjunctiva are pink and non-injected EARS: External ears normal OROPHARYNX:mucous membranes are moist  NECK: supple, no adenopathy, thyroid normal size, non-tender, without nodularity, no stridor, non-tender, trachea midline LYMPH:  no palpable lymphadenopathy BREAST:left breast normal without mass, skin or nipple changes or axillary nodes, right post-mastectomy site well healed and free of suspicious changes LUNGS: clear to auscultation and percussion HEART: regular rate & rhythm, no murmurs, no gallops, S1 normal and S2 normal ABDOMEN:abdomen soft, non-tender, obese, normal bowel sounds, no masses or organomegaly, Liver edge palpated 3 cm below costophrenic margin on deep inspiration at midclavicular line that is tender to palpation but difficult to fully assess due to body habitus  BACK: Back symmetric, no curvature., No CVA tenderness, no tenderness to percussion or palpation EXTREMITIES:less then 2 second capillary refill, no joint deformities, effusion, or inflammation, no skin discoloration, no clubbing, no cyanosis, positive findings:  edema B/L trace pitting edema.  NEURO: alert & oriented x 3 with fluent speech, no focal motor/sensory deficits, gait normal    LABORATORY DATA: CBC    Component Value Date/Time   WBC 5.4 05/03/2012 0849   RBC 4.37 05/03/2012 0849   HGB 12.3 05/03/2012 0849   HCT 37.8 05/03/2012 0849   PLT 191 05/03/2012 0849   MCV 86.5 05/03/2012 0849   MCH 28.1 05/03/2012 0849   MCHC 32.5 05/03/2012 0849   RDW 14.4 05/03/2012 0849   LYMPHSABS 2.3 05/03/2012 0849   MONOABS 0.3 05/03/2012 0849   EOSABS 0.2 05/03/2012 0849   BASOSABS 0.0 05/03/2012 0849      Chemistry      Component Value Date/Time   NA 138 05/03/2012 0849   K 4.2 05/03/2012 0849   CL 102 05/03/2012 0849   CO2 27 05/03/2012 0849   BUN 21 05/03/2012 0849   CREATININE 1.19* 05/03/2012 0849      Component Value Date/Time   CALCIUM 9.8 05/03/2012 0849   ALKPHOS 81 05/03/2012  0849   AST 13 05/03/2012 0849   ALT 12 05/03/2012 0849   BILITOT 0.3 05/03/2012 0849     Lab Results  Component Value Date   LABCA2 26 05/03/2012     RADIOGRAPHIC STUDIES:  03/05/2012  *RADIOLOGY REPORT*  Clinical Data: Evaluate for bone metastases  NUCLEAR MEDICINE WHOLE BODY BONE SCINTIGRAPHY  Technique: Whole body anterior and posterior images were obtained  approximately 3 hours after intravenous injection of  radiopharmaceutical.  Radiopharmaceutical: CURIE TC-MDP TECHNETIUM TC 66M  MEDRONATE IV KIT  Comparison: 11/07/2011  Findings: Exam detail is diminished due to the patient's body  habitus. Again noted are multifocal areas of increased radiotracer  uptake within the spine, pelvis and left upper extremity consistent  with bone metastases. Compared with the previous exam the areas of  uptake within the posterior pelvis are stable to improved in the  interval. Less conspicuous uptake is noted within the lumbar  spine. The focus of increased uptake within the proximal left  humerus is stable to improved in the interval. The posterior  calvarial lesion is also stable to improved in the interval. There  is normal physiologic tracer uptake within the kidneys and urinary  bladder.  IMPRESSION:  1. Multifocal areas of  bone metastasis are stable to slightly  improved in the interval. No evidence for progression of disease.  Original Report Authenticated By: Signa Kell, M.D.     ASSESSMENT:  1. 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 She presented with a 7 cm tumor, extensive LVI. Tumor focally extended to the anterior surface margin, 8 of 8 lymph nodes were involved with metastatic disease. She was ER positive 100%, PR  positive 99%, HER2 neu negative, Ki-67 marker 14%. No liver involvement, no lung involvement and she had a mastectomy followed by radiation therapy to T4 through T7 due to severe involvement at this area.  She was treated with Arimidex initially from 07/2009 through 03/23/2010 and was progressing by bone scan criteria. We switched her to tamoxifen which she is tolerating well and her bone scan is stable. She has not received radiation therapy to the chest wall or axilla.  2. 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  3. Obesity.  4. Diabetes mellitus.  5. History of depression on therapy.  6. Hypercholesterolemia.  7. Poor dental hygiene with multiple missing teeth  8. Nausea following medication consumption  9. Hypoglycemic events in AM  10. Poor venous access  11. Tinea versicolor, resolved after treatment with Diflucan   PLAN:  1. I personally reviewed and went over laboratory results with the patient. 2. I personally reviewed and went over radiographic studies with the patient. 3. Continue Xgeva every 4 weeks for bone mets (last given on 05/03/2012) 4. Continue Tamoxifen daily. 5. Labs every 4 weeks: CBC diff, CMET, CA 27.29 6. Bone scan in August 2014 7. Return in 3 months for follow-up  All questions were answered. The patient knows to call the clinic with any problems, questions or concerns. We can certainly see the patient much sooner if necessary.  The patient and plan discussed with Glenford Peers, MD and he is in agreement with the aforementioned.  KEFALAS,THOMAS

## 2012-05-17 ENCOUNTER — Ambulatory Visit (INDEPENDENT_AMBULATORY_CARE_PROVIDER_SITE_OTHER): Payer: Medicare Other | Admitting: Family Medicine

## 2012-05-17 ENCOUNTER — Encounter: Payer: Self-pay | Admitting: Family Medicine

## 2012-05-17 VITALS — BP 130/80 | HR 80 | Resp 16 | Wt 313.0 lb

## 2012-05-17 DIAGNOSIS — C7951 Secondary malignant neoplasm of bone: Secondary | ICD-10-CM

## 2012-05-17 DIAGNOSIS — I10 Essential (primary) hypertension: Secondary | ICD-10-CM | POA: Diagnosis not present

## 2012-05-17 DIAGNOSIS — C7952 Secondary malignant neoplasm of bone marrow: Secondary | ICD-10-CM | POA: Diagnosis not present

## 2012-05-17 DIAGNOSIS — E119 Type 2 diabetes mellitus without complications: Secondary | ICD-10-CM

## 2012-05-17 DIAGNOSIS — C801 Malignant (primary) neoplasm, unspecified: Secondary | ICD-10-CM

## 2012-05-17 DIAGNOSIS — F329 Major depressive disorder, single episode, unspecified: Secondary | ICD-10-CM

## 2012-05-17 DIAGNOSIS — F32A Depression, unspecified: Secondary | ICD-10-CM

## 2012-05-17 LAB — MICROALBUMIN / CREATININE URINE RATIO
Creatinine, Urine: 121.5 mg/dL
Microalb Creat Ratio: 4.1 mg/g (ref 0.0–30.0)
Microalb, Ur: 0.5 mg/dL (ref 0.00–1.89)

## 2012-05-17 MED ORDER — SERTRALINE HCL 100 MG PO TABS: 100.0000 mg | ORAL_TABLET | Freq: Every day | ORAL | Status: DC

## 2012-05-17 MED ORDER — ATORVASTATIN CALCIUM 20 MG PO TABS: 20.0000 mg | ORAL_TABLET | Freq: Every day | ORAL | Status: DC

## 2012-05-17 MED ORDER — GLIPIZIDE ER 10 MG PO TB24: 10.0000 mg | ORAL_TABLET | Freq: Every day | ORAL | Status: DC

## 2012-05-17 MED ORDER — CARVEDILOL 12.5 MG PO TABS: 12.5000 mg | ORAL_TABLET | Freq: Two times a day (BID) | ORAL | Status: DC

## 2012-05-17 MED ORDER — LISINOPRIL-HYDROCHLOROTHIAZIDE 10-12.5 MG PO TABS: 1.0000 | ORAL_TABLET | Freq: Every day | ORAL | Status: DC

## 2012-05-17 NOTE — Assessment & Plan Note (Addendum)
A1c was improved some however her goal is to get to less than 7. She's taken aspirin and a statin drug I will change her blood pressure medicine per above so that she is on an ACE inhibitor. She's been seen by foot doctor. urine microalbumin today Increase humalog with meals to 10 units Fasting sugars look good

## 2012-05-17 NOTE — Assessment & Plan Note (Signed)
I reviewed her medication list again had an oversight regarding her atenolol and use of carvedilol. I would discontinue the atenolol chlorthalidone I will place her on lisinopril HCTZ as she doesn't ACE inhibitor with her diabetes mellitus. Her Coreg dose was corrected to 12.5 mg twice a day

## 2012-05-17 NOTE — Patient Instructions (Addendum)
Continue Lantus at 45 units twice a day Increase Humalog to 10 units with lunch and dinner Stop the atenolol Start lisinopril- HCTZ for blood pressure and protect kidneys Coreg is 1 pill twice a day  F/U 8 weeks [employer]

## 2012-05-17 NOTE — Progress Notes (Signed)
  Subjective:    Patient ID: Kaylee Mitchell, female    DOB: Apr 07, 1955, 57 y.o.   MRN: [id number]  HPI     Patient here to followup diabetes and hypertension in interim. Blood sugars fasting have been 99 to low 110s. Her pre-lunch she states has been 150s dinner 150s to 260s. She's been injecting Humalog 5 units with lunch and dinner she does not eat breakfast. Lantus 45 units twice a day her A1c has improved from 11.5-10.1%.   Review of Systems   GEN- denies fatigue, fever, weight loss,weakness, recent illness HEENT- denies eye drainage, change in vision, nasal discharge, CVS- denies chest pain, palpitations RESP- denies SOB, cough, wheeze       Objective:   Physical Exam  GEN- NAD, alert and oriented x3 CVS- RRR, no murmur RESP-CTAB EXT- No edema Pulses- Radial, DP- 2+        Assessment & Plan:

## 2012-05-31 ENCOUNTER — Ambulatory Visit (HOSPITAL_COMMUNITY): Payer: Medicare Other | Admitting: Oncology

## 2012-05-31 ENCOUNTER — Encounter (HOSPITAL_BASED_OUTPATIENT_CLINIC_OR_DEPARTMENT_OTHER): Payer: Medicare Other

## 2012-05-31 ENCOUNTER — Encounter (HOSPITAL_COMMUNITY): Payer: Medicare Other | Attending: Oncology

## 2012-05-31 VITALS — BP 112/64 | HR 97 | Temp 98.0°F | Resp 18

## 2012-05-31 DIAGNOSIS — C50919 Malignant neoplasm of unspecified site of unspecified female breast: Secondary | ICD-10-CM

## 2012-05-31 DIAGNOSIS — C7951 Secondary malignant neoplasm of bone: Secondary | ICD-10-CM | POA: Diagnosis not present

## 2012-05-31 DIAGNOSIS — C50219 Malignant neoplasm of upper-inner quadrant of unspecified female breast: Secondary | ICD-10-CM | POA: Diagnosis not present

## 2012-05-31 DIAGNOSIS — C7952 Secondary malignant neoplasm of bone marrow: Secondary | ICD-10-CM | POA: Insufficient documentation

## 2012-05-31 DIAGNOSIS — C801 Malignant (primary) neoplasm, unspecified: Secondary | ICD-10-CM | POA: Insufficient documentation

## 2012-05-31 LAB — CBC WITH DIFFERENTIAL/PLATELET
Basophils Relative: 1 % (ref 0–1)
Eosinophils Absolute: 0.1 10*3/uL (ref 0.0–0.7)
Eosinophils Relative: 2 % (ref 0–5)
Lymphs Abs: 2.3 10*3/uL (ref 0.7–4.0)
MCH: 28.5 pg (ref 26.0–34.0)
MCHC: 32.7 g/dL (ref 30.0–36.0)
MCV: 87 fL (ref 78.0–100.0)
Monocytes Relative: 7 % (ref 3–12)
Platelets: 181 10*3/uL (ref 150–400)
RBC: 4.32 MIL/uL (ref 3.87–5.11)

## 2012-05-31 LAB — COMPREHENSIVE METABOLIC PANEL
Albumin: 3.2 g/dL — ABNORMAL LOW (ref 3.5–5.2)
BUN: 23 mg/dL (ref 6–23)
Calcium: 9.5 mg/dL (ref 8.4–10.5)
GFR calc Af Amer: 49 mL/min — ABNORMAL LOW (ref >90)
Glucose, Bld: 349 mg/dL — ABNORMAL HIGH (ref 70–99)
Sodium: 136 mEq/L (ref 135–145)
Total Protein: 7.2 g/dL (ref 6.0–8.3)

## 2012-05-31 MED ORDER — OXYCODONE-ACETAMINOPHEN 7.5-325 MG PO TABS: 1.0000 | ORAL_TABLET | Freq: Three times a day (TID) | ORAL | Status: DC | PRN

## 2012-05-31 MED ORDER — DENOSUMAB 120 MG/1.7ML ~~LOC~~ SOLN
120.0000 mg | Freq: Once | SUBCUTANEOUS | Status: AC
  Administered 2012-05-31: 120 mg via SUBCUTANEOUS
  Filled 2012-05-31: qty 1.7

## 2012-05-31 NOTE — Progress Notes (Signed)
Marceil L Aycock presents today for injection per MD orders. Xgeva 120 mg administered SQ in left Abdomen. Administration without incident. Patient tolerated well.  

## 2012-05-31 NOTE — Addendum Note (Signed)
Addended by: Ellouise Newer on: 05/31/2012 10:09 AM   Modules accepted: Orders

## 2012-05-31 NOTE — Progress Notes (Signed)
Labs drawn today for cbc/diff,cmp,[id number] 

## 2012-06-01 LAB — CANCER ANTIGEN 27.29: CA 27.29: 22 U/mL (ref 0–39)

## 2012-06-24 ENCOUNTER — Emergency Department (HOSPITAL_COMMUNITY): Payer: Medicare Other

## 2012-06-24 ENCOUNTER — Emergency Department (HOSPITAL_COMMUNITY)
Admission: EM | Admit: 2012-06-24 | Discharge: 2012-06-24 | Disposition: A | Discharge status: Home / Self Care | Payer: Medicare Other | Attending: Emergency Medicine | Admitting: Emergency Medicine

## 2012-06-24 ENCOUNTER — Encounter (HOSPITAL_COMMUNITY): Payer: Self-pay | Admitting: *Deleted

## 2012-06-24 DIAGNOSIS — C7951 Secondary malignant neoplasm of bone: Secondary | ICD-10-CM | POA: Diagnosis not present

## 2012-06-24 DIAGNOSIS — E7800 Pure hypercholesterolemia, unspecified: Secondary | ICD-10-CM | POA: Diagnosis not present

## 2012-06-24 DIAGNOSIS — Z79899 Other long term (current) drug therapy: Secondary | ICD-10-CM | POA: Diagnosis not present

## 2012-06-24 DIAGNOSIS — Z7982 Long term (current) use of aspirin: Secondary | ICD-10-CM | POA: Diagnosis not present

## 2012-06-24 DIAGNOSIS — Z9889 Other specified postprocedural states: Secondary | ICD-10-CM | POA: Insufficient documentation

## 2012-06-24 DIAGNOSIS — F329 Major depressive disorder, single episode, unspecified: Secondary | ICD-10-CM | POA: Insufficient documentation

## 2012-06-24 DIAGNOSIS — I10 Essential (primary) hypertension: Secondary | ICD-10-CM | POA: Insufficient documentation

## 2012-06-24 DIAGNOSIS — Z853 Personal history of malignant neoplasm of breast: Secondary | ICD-10-CM | POA: Insufficient documentation

## 2012-06-24 DIAGNOSIS — E119 Type 2 diabetes mellitus without complications: Secondary | ICD-10-CM | POA: Diagnosis not present

## 2012-06-24 DIAGNOSIS — C419 Malignant neoplasm of bone and articular cartilage, unspecified: Secondary | ICD-10-CM | POA: Diagnosis not present

## 2012-06-24 DIAGNOSIS — M79609 Pain in unspecified limb: Secondary | ICD-10-CM | POA: Diagnosis not present

## 2012-06-24 DIAGNOSIS — M5430 Sciatica, unspecified side: Secondary | ICD-10-CM | POA: Insufficient documentation

## 2012-06-24 DIAGNOSIS — Z794 Long term (current) use of insulin: Secondary | ICD-10-CM | POA: Insufficient documentation

## 2012-06-24 DIAGNOSIS — C50919 Malignant neoplasm of unspecified site of unspecified female breast: Secondary | ICD-10-CM | POA: Diagnosis not present

## 2012-06-24 DIAGNOSIS — M5431 Sciatica, right side: Secondary | ICD-10-CM

## 2012-06-24 DIAGNOSIS — C7952 Secondary malignant neoplasm of bone marrow: Secondary | ICD-10-CM | POA: Diagnosis not present

## 2012-06-24 DIAGNOSIS — F3289 Other specified depressive episodes: Secondary | ICD-10-CM | POA: Insufficient documentation

## 2012-06-24 LAB — CBC WITH DIFFERENTIAL/PLATELET
Eosinophils Absolute: 0.2 10*3/uL (ref 0.0–0.7)
Eosinophils Relative: 2 % (ref 0–5)
Hemoglobin: 12.3 g/dL (ref 12.0–15.0)
Lymphocytes Relative: 34 % (ref 12–46)
Lymphs Abs: 2.5 10*3/uL (ref 0.7–4.0)
MCH: 28.7 pg (ref 26.0–34.0)
MCV: 87.2 fL (ref 78.0–100.0)
Monocytes Relative: 7 % (ref 3–12)
Neutrophils Relative %: 57 % (ref 43–77)
RBC: 4.29 MIL/uL (ref 3.87–5.11)
WBC: 7.3 10*3/uL (ref 4.0–10.5)

## 2012-06-24 LAB — URINALYSIS, ROUTINE W REFLEX MICROSCOPIC
Bilirubin Urine: NEGATIVE
Ketones, ur: NEGATIVE mg/dL
Leukocytes, UA: NEGATIVE
Nitrite: NEGATIVE
Protein, ur: NEGATIVE mg/dL
Urobilinogen, UA: 0.2 mg/dL (ref 0.0–1.0)
pH: 6 (ref 5.0–8.0)

## 2012-06-24 LAB — COMPREHENSIVE METABOLIC PANEL
ALT: 11 U/L (ref 0–35)
Alkaline Phosphatase: 78 U/L (ref 39–117)
BUN: 18 mg/dL (ref 6–23)
CO2: 30 mEq/L (ref 19–32)
GFR calc Af Amer: 59 mL/min — ABNORMAL LOW (ref >90)
GFR calc non Af Amer: 51 mL/min — ABNORMAL LOW (ref >90)
Glucose, Bld: 261 mg/dL — ABNORMAL HIGH (ref 70–99)
Potassium: 3.7 mEq/L (ref 3.5–5.1)
Sodium: 137 mEq/L (ref 135–145)
Total Protein: 7.3 g/dL (ref 6.0–8.3)

## 2012-06-24 LAB — GLUCOSE, CAPILLARY: Glucose-Capillary: 346 mg/dL — ABNORMAL HIGH (ref 70–99)

## 2012-06-24 MED ORDER — HYDROCODONE-ACETAMINOPHEN 5-325 MG PO TABS: 1.0000 | ORAL_TABLET | ORAL | Status: DC | PRN

## 2012-06-24 MED ORDER — HYDROCODONE-ACETAMINOPHEN 5-325 MG PO TABS
1.0000 | ORAL_TABLET | Freq: Once | ORAL | Status: AC
  Administered 2012-06-24: 1 via ORAL
  Filled 2012-06-24: qty 1

## 2012-06-24 NOTE — ED Notes (Signed)
Pt c/o right lower leg pain that started Friday, denies any injury, states that the pain is worse with walking, cms intact distal

## 2012-06-24 NOTE — ED Provider Notes (Signed)
History     CSN: [id number]  Arrival date & time 06/24/12  1407   First MD Initiated Contact with Patient 06/24/12 1604      Chief Complaint  Patient presents with  . Leg Pain    (Consider location/radiation/quality/duration/timing/severity/associated sxs/prior treatment) Patient is a 57 y.o. female presenting with leg pain. The history is provided by the patient and medical records.  Leg Pain Location:  Leg Time since incident:  2 days Leg location:  R lower leg (Pt has had pain in the right leg, getting worse in the past two days, to the point that she has to use a cane or walker to get around.  She took Ibuprofen and used Ben-Gay without relief.  There was no injury.) Pain details:    Quality:  Aching   Radiates to: She has some pain in the lower back, also.     Severity:  Severe (Pain rated at a 9.)   Onset quality:  Gradual   Duration:  2 days   Timing:  Constant   Progression:  Worsening Chronicity:  New Dislocation: no   Foreign body present:  No foreign bodies Prior injury to area:  No Relieved by:  Nothing Worsened by:  Nothing tried Ineffective treatments: No relief with Ibuprofen and Ben-Gay. Associated symptoms: back pain   Associated symptoms: no fever and no numbness   Risk factors comment:  Known breast cancer metastatic to bone.   Past Medical History  Diagnosis Date  . Hypertension   . High cholesterol   . Knee pain, bilateral 2013  . Cancer     rt breast is primary  . Bone metastasis 09/13/2010  . Diabetes mellitus     type II  . Depression   . Depression 12/14/2011    Past Surgical History  Procedure Laterality Date  . Knee surgery      right-arthroscopy  . Mastectomy      right side  . Abdominal hysterectomy      Family History  Problem Relation Age of Onset  . Arthritis    . Cancer    . Diabetes    . Anesthesia problems Neg Hx   . Hypotension Neg Hx   . Malignant hyperthermia Neg Hx   . Pseudochol deficiency Neg Hx   .  Diabetes Mother   . Diabetes Father   . Cancer Maternal Aunt     History  Substance Use Topics  . Smoking status: Never Smoker   . Smokeless tobacco: Not on file  . Alcohol Use: No    OB History   Grav Para Term Preterm Abortions TAB SAB Ect Mult Living                  Review of Systems  Constitutional: Negative for fever and chills.  HENT: Negative.   Eyes: Negative.   Respiratory: Negative.   Cardiovascular: Negative.   Gastrointestinal: Negative.   Genitourinary: Negative.   Musculoskeletal: Positive for back pain.       Pain in right calf.  Skin: Negative.   Neurological: Negative.   Psychiatric/Behavioral: Negative.     Allergies  Review of patient's allergies indicates no known allergies.  Home Medications   Current Outpatient Rx  Name  Route  Sig  Dispense  Refill  . aspirin EC 81 MG tablet   Oral   Take 81 mg by mouth daily.           Marland Kitchen atorvastatin (LIPITOR) 20 MG tablet   Oral  Take 1 tablet (20 mg total) by mouth daily.   90 tablet   1   . calcium-vitamin D (OSCAL WITH D) 500-200 MG-UNIT per tablet   Oral   Take 1 tablet by mouth daily.           . carvedilol (COREG) 12.5 MG tablet   Oral   Take 1 tablet (12.5 mg total) by mouth 2 (two) times daily with a meal.   180 tablet   1     Dose Change   . glipiZIDE (GLUCOTROL XL) 10 MG 24 hr tablet   Oral   Take 1 tablet (10 mg total) by mouth daily.   90 tablet   1   . ibuprofen (ADVIL,MOTRIN) 800 MG tablet   Oral   Take 1 tablet (800 mg total) by mouth at bedtime as needed for pain.   30 tablet   2   . insulin glargine (LANTUS SOLOSTAR) 100 UNIT/ML injection   Subcutaneous   Inject 45 Units into the skin 2 (two) times daily.          . insulin lispro (HUMALOG) 100 UNIT/ML injection   Subcutaneous   Inject 10 Units into the skin 2 (two) times daily. Inject 10 units with meals         . lisinopril-hydrochlorothiazide (PRINZIDE,ZESTORETIC) 10-12.5 MG per tablet   Oral    Take 1 tablet by mouth daily. For blood pressure   90 tablet   1   . sertraline (ZOLOFT) 100 MG tablet   Oral   Take 1 tablet (100 mg total) by mouth daily. For depression and anxiety   90 tablet   1   . tamoxifen (NOLVADEX) 20 MG tablet   Oral   Take 1 tablet (20 mg total) by mouth daily.   90 tablet   2     BP 140/79  Pulse 83  Temp(Src) 98.2 F (36.8 C)  Resp 20  Ht 5\' 5"  (1.651 m)  Wt 316 lb (143.337 kg)  BMI 52.59 kg/m2  SpO2 99%  Physical Exam  Nursing note and vitals reviewed. Constitutional: She is oriented to person, place, and time.  Obese late-middle-aged woman in moderate distress with pain in the right calf.    HENT:  Head: Normocephalic and atraumatic.  Right Ear: External ear normal.  Left Ear: External ear normal.  Mouth/Throat: Oropharynx is clear and moist.  Eyes: Conjunctivae and EOM are normal. Pupils are equal, round, and reactive to light.  Neck: Normal range of motion. Neck supple.  Cardiovascular: Normal rate, regular rhythm and normal heart sounds.   Pulmonary/Chest: Effort normal and breath sounds normal.  Right breast surgically absent.   Abdominal: Soft.  Musculoskeletal:  She has right calf tenderness to palpation, without noticeable swelling.  She localizes some pain to the lumbar region, but there is no deformity or tenderness to palpation.  Neurological: She is alert and oriented to person, place, and time.  No sensory or motor deficit.  Skin: Skin is warm and dry.  Psychiatric: She has a normal mood and affect. Her behavior is normal.    ED Course  Procedures (including critical care time)  Labs Reviewed  GLUCOSE, CAPILLARY - Abnormal; Notable for the following:    Glucose-Capillary 346 (*)    All other components within normal limits  CBC WITH DIFFERENTIAL  COMPREHENSIVE METABOLIC PANEL  URINALYSIS, ROUTINE W REFLEX MICROSCOPIC  D-DIMER, QUANTITATIVE   4:31 PM Pt was seen and had physical examination. Old charts were  reviewed.  She had right mastectomy for breast cancer in 2011, has known metastases to bone, is on tamoxifen.  Lab workup ordered.  PO pain medication ordered.   6:31 PM Results for orders placed during the hospital encounter of 06/24/12  GLUCOSE, CAPILLARY      Result Value Range   Glucose-Capillary 346 (*) 70 - 99 mg/dL  CBC WITH DIFFERENTIAL      Result Value Range   WBC 7.3  4.0 - 10.5 K/uL   RBC 4.29  3.87 - 5.11 MIL/uL   Hemoglobin 12.3  12.0 - 15.0 g/dL   HCT 19.1  47.8 - 29.5 %   MCV 87.2  78.0 - 100.0 fL   MCH 28.7  26.0 - 34.0 pg   MCHC 32.9  30.0 - 36.0 g/dL   RDW 62.1  30.8 - 65.7 %   Platelets 172  150 - 400 K/uL   Neutrophils Relative % 57  43 - 77 %   Neutro Abs 4.1  1.7 - 7.7 K/uL   Lymphocytes Relative 34  12 - 46 %   Lymphs Abs 2.5  0.7 - 4.0 K/uL   Monocytes Relative 7  3 - 12 %   Monocytes Absolute 0.5  0.1 - 1.0 K/uL   Eosinophils Relative 2  0 - 5 %   Eosinophils Absolute 0.2  0.0 - 0.7 K/uL   Basophils Relative 0  0 - 1 %   Basophils Absolute 0.0  0.0 - 0.1 K/uL  COMPREHENSIVE METABOLIC PANEL      Result Value Range   Sodium 137  135 - 145 mEq/L   Potassium 3.7  3.5 - 5.1 mEq/L   Chloride 99  96 - 112 mEq/L   CO2 30  19 - 32 mEq/L   Glucose, Bld 261 (*) 70 - 99 mg/dL   BUN 18  6 - 23 mg/dL   Creatinine, Ser 8.46 (*) 0.50 - 1.10 mg/dL   Calcium 9.6  8.4 - 96.2 mg/dL   Total Protein 7.3  6.0 - 8.3 g/dL   Albumin 3.2 (*) 3.5 - 5.2 g/dL   AST 14  0 - 37 U/L   ALT 11  0 - 35 U/L   Alkaline Phosphatase 78  39 - 117 U/L   Total Bilirubin 0.3  0.3 - 1.2 mg/dL   GFR calc non Af Amer 51 (*) >90 mL/min   GFR calc Af Amer 59 (*) >90 mL/min  URINALYSIS, ROUTINE W REFLEX MICROSCOPIC      Result Value Range   Color, Urine YELLOW  YELLOW   APPearance CLEAR  CLEAR   Specific Gravity, Urine 1.020  1.005 - 1.030   pH 6.0  5.0 - 8.0   Glucose, UA >1000 (*) NEGATIVE mg/dL   Hgb urine dipstick NEGATIVE  NEGATIVE   Bilirubin Urine NEGATIVE  NEGATIVE   Ketones,  ur NEGATIVE  NEGATIVE mg/dL   Protein, ur NEGATIVE  NEGATIVE mg/dL   Urobilinogen, UA 0.2  0.0 - 1.0 mg/dL   Nitrite NEGATIVE  NEGATIVE   Leukocytes, UA NEGATIVE  NEGATIVE  D-DIMER, QUANTITATIVE      Result Value Range   D-Dimer, Quant <0.27  0.00 - 0.48 ug/mL-FEU  URINE MICROSCOPIC-ADD ON      Result Value Range   Squamous Epithelial / LPF FEW (*) RARE   WBC, UA 3-6  <3 WBC/hpf   Bacteria, UA RARE  RARE   Dg Lumbar Spine Complete  06/24/2012   *RADIOLOGY REPORT*  Clinical Data:  Leg pain.  New onset calf pain.  History of breast cancer, metastatic to the bone.  LUMBAR SPINE - COMPLETE 4+ VIEW  Comparison: Bone scan 03/05/2012  Findings: There are numerous blastic metastases within the lumbar spine, seen at most levels.  There is no evidence for pathologic fracture or subluxation.  No spondylolisthesis or spondylolysis. Limited evaluation of pelvis also shows sclerotic foci, consistent with metastases.  IMPRESSION:  1.  Multiple sclerotic metastases throughout the lumbar spine and pelvis. 2.  Suspect progression of disease since prior bone scan. 3.  No evidence for pathologic fracture.   Original Report Authenticated By: Norva Pavlov, M.D.   Dg Tibia/fibula Right  06/24/2012   *RADIOLOGY REPORT*  Clinical Data: Leg pain.  New onset calf pain.  History of breast cancer, metastatic to the bone.  RIGHT TIBIA AND FIBULA - 2 VIEW  Comparison: 04/24/2003 right kneeand bone scan 03/05/2012  Findings: Two views are performed, showing no evidence for acute fracture or subluxation.  No suspicious lytic or blastic lesions are identified.  Degenerative changes are seen at the knee.  Soft tissue contours are normal.  IMPRESSION: Negative exam.   Original Report Authenticated By: Norva Pavlov, M.D.    Pt's x-rays suggests progression of metastatic disease in the lumbar spine.  D-dimer was negative.  X-ray of right tibia and fibula was negative.  Advised Rx of pain with hydrocodone-acetaminophen, and  advised her to contact Dr. Mariel Sleet, her oncologist, tomorrow to see what additional tests might be needed and what treatment would help.   1. Sciatica, right   2. Metastatic cancer to bone           Carleene Cooper III, MD 06/24/12 [phone]

## 2012-06-25 ENCOUNTER — Other Ambulatory Visit (HOSPITAL_COMMUNITY): Payer: Self-pay | Admitting: Oncology

## 2012-06-25 DIAGNOSIS — R262 Difficulty in walking, not elsewhere classified: Secondary | ICD-10-CM

## 2012-06-25 DIAGNOSIS — C50919 Malignant neoplasm of unspecified site of unspecified female breast: Secondary | ICD-10-CM

## 2012-06-25 DIAGNOSIS — C7951 Secondary malignant neoplasm of bone: Secondary | ICD-10-CM

## 2012-06-26 ENCOUNTER — Other Ambulatory Visit: Payer: Medicare Other

## 2012-06-26 ENCOUNTER — Ambulatory Visit
Admission: RE | Admit: 2012-06-26 | Discharge: 2012-06-26 | Disposition: A | Discharge status: Home / Self Care | Payer: Medicare Other | Source: Ambulatory Visit | Attending: Oncology | Admitting: Oncology

## 2012-06-26 DIAGNOSIS — C50919 Malignant neoplasm of unspecified site of unspecified female breast: Secondary | ICD-10-CM

## 2012-06-26 DIAGNOSIS — C7951 Secondary malignant neoplasm of bone: Secondary | ICD-10-CM

## 2012-06-26 DIAGNOSIS — R262 Difficulty in walking, not elsewhere classified: Secondary | ICD-10-CM

## 2012-06-26 DIAGNOSIS — C7952 Secondary malignant neoplasm of bone marrow: Secondary | ICD-10-CM | POA: Diagnosis not present

## 2012-06-26 MED ORDER — GADOBENATE DIMEGLUMINE 529 MG/ML IV SOLN
20.0000 mL | Freq: Once | INTRAVENOUS | Status: AC | PRN
  Administered 2012-06-26: 20 mL via INTRAVENOUS

## 2012-06-28 ENCOUNTER — Ambulatory Visit (HOSPITAL_COMMUNITY): Payer: Medicare Other

## 2012-06-28 ENCOUNTER — Other Ambulatory Visit (HOSPITAL_COMMUNITY): Payer: Medicare Other

## 2012-07-02 ENCOUNTER — Encounter (HOSPITAL_COMMUNITY): Payer: Medicare Other | Attending: Oncology

## 2012-07-02 ENCOUNTER — Other Ambulatory Visit (HOSPITAL_COMMUNITY): Payer: Self-pay | Admitting: Oncology

## 2012-07-02 ENCOUNTER — Encounter (HOSPITAL_COMMUNITY): Payer: Medicare Other

## 2012-07-02 VITALS — BP 151/96 | HR 96

## 2012-07-02 DIAGNOSIS — C7951 Secondary malignant neoplasm of bone: Secondary | ICD-10-CM | POA: Diagnosis not present

## 2012-07-02 DIAGNOSIS — C50919 Malignant neoplasm of unspecified site of unspecified female breast: Secondary | ICD-10-CM

## 2012-07-02 DIAGNOSIS — C801 Malignant (primary) neoplasm, unspecified: Secondary | ICD-10-CM | POA: Insufficient documentation

## 2012-07-02 DIAGNOSIS — C50219 Malignant neoplasm of upper-inner quadrant of unspecified female breast: Secondary | ICD-10-CM

## 2012-07-02 MED ORDER — OXYCODONE-ACETAMINOPHEN 5-325 MG PO TABS: 1.0000 | ORAL_TABLET | ORAL | Status: DC | PRN

## 2012-07-02 MED ORDER — DENOSUMAB 120 MG/1.7ML ~~LOC~~ SOLN
120.0000 mg | Freq: Once | SUBCUTANEOUS | Status: AC
  Administered 2012-07-02: 120 mg via SUBCUTANEOUS
  Filled 2012-07-02: qty 1.7

## 2012-07-02 NOTE — Progress Notes (Signed)
Kaylee Mitchell presents today for injection per MD orders. Xgeva 120 mg administered SQ in left Abdomen. Administration without incident. Patient tolerated well.  

## 2012-07-03 MED ORDER — DIPHENHYDRAMINE HCL 50 MG/ML IJ SOLN
INTRAMUSCULAR | Status: AC
  Filled 2012-07-03: qty 1

## 2012-07-03 MED ORDER — FAMOTIDINE IN NACL 20-0.9 MG/50ML-% IV SOLN
INTRAVENOUS | Status: AC
  Filled 2012-07-03: qty 50

## 2012-07-13 ENCOUNTER — Telehealth: Payer: Self-pay | Admitting: Family Medicine

## 2012-07-13 MED ORDER — INSULIN GLARGINE 100 UNIT/ML ~~LOC~~ SOLN: 45.0000 [IU] | Freq: Two times a day (BID) | SUBCUTANEOUS | Status: DC

## 2012-07-13 NOTE — Telephone Encounter (Signed)
Med refill

## 2012-07-17 ENCOUNTER — Ambulatory Visit: Payer: Medicare Other | Admitting: Family Medicine

## 2012-07-26 ENCOUNTER — Encounter (HOSPITAL_BASED_OUTPATIENT_CLINIC_OR_DEPARTMENT_OTHER): Payer: Medicare Other

## 2012-07-26 ENCOUNTER — Encounter (HOSPITAL_COMMUNITY): Payer: Medicare Other | Attending: Oncology

## 2012-07-26 ENCOUNTER — Telehealth: Payer: Self-pay | Admitting: Family Medicine

## 2012-07-26 VITALS — BP 111/64 | HR 85 | Temp 98.4°F | Resp 18

## 2012-07-26 DIAGNOSIS — C50219 Malignant neoplasm of upper-inner quadrant of unspecified female breast: Secondary | ICD-10-CM

## 2012-07-26 DIAGNOSIS — C50919 Malignant neoplasm of unspecified site of unspecified female breast: Secondary | ICD-10-CM

## 2012-07-26 DIAGNOSIS — C801 Malignant (primary) neoplasm, unspecified: Secondary | ICD-10-CM | POA: Diagnosis not present

## 2012-07-26 DIAGNOSIS — C7951 Secondary malignant neoplasm of bone: Secondary | ICD-10-CM

## 2012-07-26 DIAGNOSIS — C7952 Secondary malignant neoplasm of bone marrow: Secondary | ICD-10-CM | POA: Insufficient documentation

## 2012-07-26 LAB — CBC WITH DIFFERENTIAL/PLATELET
Basophils Absolute: 0 10*3/uL (ref 0.0–0.1)
Basophils Relative: 0 % (ref 0–1)
Eosinophils Absolute: 0.1 10*3/uL (ref 0.0–0.7)
Eosinophils Relative: 2 % (ref 0–5)
HCT: 39.4 % (ref 36.0–46.0)
Hemoglobin: 12.8 g/dL (ref 12.0–15.0)
Lymphocytes Relative: 40 % (ref 12–46)
Lymphs Abs: 2.3 10*3/uL (ref 0.7–4.0)
MCH: 28.1 pg (ref 26.0–34.0)
MCHC: 32.5 g/dL (ref 30.0–36.0)
MCV: 86.4 fL (ref 78.0–100.0)
Monocytes Absolute: 0.4 10*3/uL (ref 0.1–1.0)
Monocytes Relative: 6 % (ref 3–12)
Neutro Abs: 3 10*3/uL (ref 1.7–7.7)
Neutrophils Relative %: 52 % (ref 43–77)
Platelets: 184 10*3/uL (ref 150–400)
RBC: 4.56 MIL/uL (ref 3.87–5.11)
RDW: 13.5 % (ref 11.5–15.5)
WBC: 5.7 10*3/uL (ref 4.0–10.5)

## 2012-07-26 LAB — COMPREHENSIVE METABOLIC PANEL
ALT: 13 U/L (ref 0–35)
Alkaline Phosphatase: 82 U/L (ref 39–117)
BUN: 20 mg/dL (ref 6–23)
CO2: 28 mEq/L (ref 19–32)
Calcium: 9.3 mg/dL (ref 8.4–10.5)
GFR calc Af Amer: 57 mL/min — ABNORMAL LOW (ref >90)
GFR calc non Af Amer: 49 mL/min — ABNORMAL LOW (ref >90)
Glucose, Bld: 436 mg/dL — ABNORMAL HIGH (ref 70–99)
Sodium: 135 mEq/L (ref 135–145)

## 2012-07-26 MED ORDER — INSULIN GLARGINE 100 UNIT/ML SOLOSTAR PEN: 45.0000 [IU] | PEN_INJECTOR | Freq: Two times a day (BID) | SUBCUTANEOUS | Status: DC

## 2012-07-26 MED ORDER — DENOSUMAB 120 MG/1.7ML ~~LOC~~ SOLN
120.0000 mg | Freq: Once | SUBCUTANEOUS | Status: AC
  Administered 2012-07-26: 120 mg via SUBCUTANEOUS
  Filled 2012-07-26: qty 1.7

## 2012-07-26 NOTE — Progress Notes (Signed)
Labs drawn today for cbc/diff,cmp,[id number] 

## 2012-07-26 NOTE — Telephone Encounter (Signed)
Insulin vials refilled and patients uses Solostar pens.  Pharmacy states they have faxed [place] for correction but no faxes have been received here. Correction made to med list and new Rx sent for correct insulin.  Apparently patient has been out of her insulin for several days now!!!!

## 2012-07-26 NOTE — Progress Notes (Signed)
Kaylee Mitchell presents today for injection per MD orders. Xgeva 120 mg administered SQ in right Abdomen. Administration without incident. Patient tolerated well.

## 2012-07-29 NOTE — Telephone Encounter (Signed)
Noted, sent nurse not to schedule appt overdue

## 2012-07-30 ENCOUNTER — Encounter (HOSPITAL_COMMUNITY): Payer: Self-pay

## 2012-07-30 ENCOUNTER — Encounter (HOSPITAL_BASED_OUTPATIENT_CLINIC_OR_DEPARTMENT_OTHER): Payer: Medicare Other

## 2012-07-30 VITALS — BP 129/84 | HR 101 | Temp 97.5°F | Resp 18 | Wt 305.4 lb

## 2012-07-30 DIAGNOSIS — C50911 Malignant neoplasm of unspecified site of right female breast: Secondary | ICD-10-CM

## 2012-07-30 DIAGNOSIS — C50919 Malignant neoplasm of unspecified site of unspecified female breast: Secondary | ICD-10-CM

## 2012-07-30 DIAGNOSIS — C801 Malignant (primary) neoplasm, unspecified: Secondary | ICD-10-CM | POA: Diagnosis not present

## 2012-07-30 DIAGNOSIS — C7951 Secondary malignant neoplasm of bone: Secondary | ICD-10-CM

## 2012-07-30 NOTE — Progress Notes (Addendum)
[facility] Telephone:([phone]   [phone]  OFFICE PROGRESS NOTE  [name], MD [address] [place] [address]  DIAGNOSIS: Metastatic breast cancer.  ONCOLOGIC HISTORY: According to note from Dr Mariel Sleet;She presented with a 7 cm tumor of the right breast, extensive LVI. Tumor focally  extended to the anterior surface margin, 8 of 8 lymph nodes were involved with metastatic disease. She was ER positive 100%, PR  positive 99%, HER2 neu negative, Ki-67 marker 14%. No liver involvement, no lung involvement and she had a mastectomy followed  by radiation therapy to T4 through T7 due to severe involvement at this area. She was treated with Arimidex initially from 07/2009 through 03/23/2010 and was progressing by bone scan criteria. We switched her to tamoxifen which she is tolerating well and her bone  scan is now stable. She has not received radiation therapy to the chest wall or axilla .  She has continued to tamoxifen since then.  INTERVAL HISTORY:   Kaylee Mitchell 57 y.o. female returns to the clinic today for scheduled follow up.  She was recently seen in the emergency room in June for leg pain which she says has since resolved. Back the she had extremity lumbar spine which did not show any evidence of pathologic fracture but did show evidence of multiple metastasis. X-ray of the tibia and fibula also was negative for fracture. On 06/26/12 she had an MRI of the lumbar spine which showed; "The spinal cord terminates posterior to the L1-L2 interspace. Metastatic disease extends into the posterior elements, most pronounced at L3 and L4. There is no convincing evidence of epidural or intrathecal metastasis."  She reports dizzy spells and did admit that her diabetes has not been recently well controlled.She however tells me that recently she's had that for control of her diabetes due to not  having part of her insulin regimen(Lantus) for about one  week, she is now back on it. She also reports having flashes and sweats but states that they tolerable.  She reports taking tamoxifen daily as instructed and also on calcium and vitamin D. She is also on Xgeva monthly. She otherwise denies new problem.  She is eating well and denies shortness of breath or pain at this time.  She has an upcoming appointment for a bone scan in August of 2014.  MEDICAL HISTORY: Past Medical History  Diagnosis Date  . Hypertension   . High cholesterol   . Knee pain, bilateral 2013  . Cancer     rt breast is primary  . Bone metastasis 09/13/2010  . Diabetes mellitus     type II  . Depression   . Depression 12/14/2011    ALLERGIES:  has No Known Allergies.  MEDICATIONS:  Current Outpatient Prescriptions  Medication Sig Dispense Refill  . aspirin EC 81 MG tablet Take 81 mg by mouth daily.        Marland Kitchen atorvastatin (LIPITOR) 20 MG tablet Take 1 tablet (20 mg total) by mouth daily.  90 tablet  1  . calcium-vitamin D (OSCAL WITH D) 500-200 MG-UNIT per tablet Take 1 tablet by mouth daily.        . carvedilol (COREG) 12.5 MG tablet Take 1 tablet (12.5 mg total) by mouth 2 (two) times daily with a meal.  180 tablet  1  . ibuprofen (ADVIL,MOTRIN) 800 MG tablet Take 1 tablet (800 mg total) by mouth at bedtime as needed for pain.  30 tablet  2  . Insulin Glargine (LANTUS SOLOSTAR) 100 UNIT/ML SOPN Inject 45 Units into the skin 2 (two) times daily at 8 am and 10 pm.  5 pen  PRN  . insulin lispro (HUMALOG) 100 UNIT/ML injection Inject 10 Units into the skin 2 (two) times daily. Inject 10 units with meals      . lisinopril-hydrochlorothiazide (PRINZIDE,ZESTORETIC) 10-12.5 MG per tablet Take 1 tablet by mouth daily. For blood pressure  90 tablet  1  . oxyCODONE-acetaminophen (PERCOCET/ROXICET) 5-325 MG per tablet Take 1 tablet by mouth every 4 (four) hours as needed for pain.  60 tablet  0  . sertraline (ZOLOFT) 100 MG tablet Take 1 tablet (100 mg total) by mouth daily.  For depression and anxiety  90 tablet  1  . tamoxifen (NOLVADEX) 20 MG tablet Take 1 tablet (20 mg total) by mouth daily.  90 tablet  2  . glipiZIDE (GLUCOTROL XL) 10 MG 24 hr tablet Take 1 tablet (10 mg total) by mouth daily.  90 tablet  1  . [DISCONTINUED] loratadine (CLARITIN) 10 MG tablet Take 10 mg by mouth daily as needed. For allergies       No current facility-administered medications for this visit.   Facility-Administered Medications Ordered in Other Visits  Medication Dose Route Frequency Provider Last Rate Last Dose  . 1 mL EPINEPHrine 1 mg/mL (1:1000) in 0.9% Normal Saline 3000 mL irrigation    PRN Vickki Hearing, MD        SURGICAL HISTORY:  Past Surgical History  Procedure Laterality Date  . Knee surgery      right-arthroscopy  . Mastectomy      right side  . Abdominal hysterectomy       REVIEW OF SYSTEMS: 14 point review of system is as in the history above otherwise negative.    PHYSICAL EXAMINATION:  Blood pressure 129/84, pulse 101, temperature 97.5 F (36.4 C), temperature source Oral, resp. rate 18, weight 305 lb 6.4 oz (138.529 kg). GENERAL: No distress, obese.  SKIN:  No rashes or significant lesions  HEAD: Normocephalic, No masses, lesions, tenderness or abnormalities  EYES: Conjunctiva are pink and non-injected  ENT: External ears normal ,lips, buccal mucosa, and tongue normal and mucous membranes are moist  LYMPH: No palpable lymphadenopathy in the neck, supraclavicular areas or axilla. BREAST:Right mastectomy scar no evidence of local recurrence.  Left breast big but are otherwise unremarkable for masses. LUNGS:decreased breath sounds bilaterally , no crackles or wheezes HEART: regular rate & rhythm, no murmurs, no gallops, S1 normal and S2 normal  ABDOMEN: Abdomen soft, non-tender, normal bowel sounds, no masses or organomegaly and no hepatosplenomegaly palpable. MSK: No CVA tenderness and no tenderness on percussion of the back or rib  cage. EXTREMITIES: No edema, no skin discoloration or tenderness NEURO: alert & oriented , no focal motor/sensory deficits.     LABORATORY DATA: Lab Results  Component Value Date   WBC 5.7 07/26/2012   HGB 12.8 07/26/2012   HCT 39.4 07/26/2012   MCV 86.4 07/26/2012   PLT 184 07/26/2012      Chemistry      Component Value Date/Time   NA 135 07/26/2012 0904   K 4.2 07/26/2012 0904   CL 100 07/26/2012 0904   CO2 28 07/26/2012 0904   BUN 20 07/26/2012 0904   CREATININE 1.20* 07/26/2012 0904      Component Value Date/Time   CALCIUM 9.3 07/26/2012 0904   ALKPHOS 82 07/26/2012 0904   AST  19 07/26/2012 0904   ALT 13 07/26/2012 0904   BILITOT 0.4 07/26/2012 0904       RADIOGRAPHIC STUDIES: No results found.   ASSESSMENT:  Patient had not has metastatic breast cancer with no clear evidence of disease progression at this time.  She is also on bone treatment with Xgeva ,calcium and  vitamin D   PLAN:  1. She'll continue tamoxifen for now, since she has an upcoming bone scan next month we'll wait for this to determine if there is clear evidence of progression.  Based on recent imaging in 06/2012, there was no convincing evidence of progression at this time.  She agrees with this plan. If there is evidence of progression at that time , then switching therapy to Aromasin would be reasonable given that this is a steroid AI which can be used after progression on prior  nonsteroidal AI( Arimidex). There is also no evidence of cord compression and a lumbar MRI besides her symptoms as resolved without any specific treatment of cord compression.  2. She'll return to clinic after her scheduled bone scan. I ore verbalized understanding. I have a feeling that some of her symptoms related to poorly controlled diabetes .    All questions were satisfactorily answered.She knows to call if she has any concern.  I spent more than 50% face to face on counseling and coordination of care. The total time spent  in the appointment was 30 minutes.   Sherral Hammers, MD FACP. Hematology/Oncology.

## 2012-07-30 NOTE — Patient Instructions (Addendum)
[facility] Discharge Instructions  RECOMMENDATIONS MADE BY THE CONSULTANT AND ANY TEST RESULTS WILL BE SENT TO YOUR REFERRING PHYSICIAN.  Continue Tamoxifen as prescribed. Get with your medical doctor about your blood sugars. You need better control of your diabetes. Keep all appointments as scheduled. Have your Bone Density scan done as ordered. MD appointment here after your Bone Density scan.  Thank you for choosing [facility] to provide your oncology and hematology care.  To afford each patient quality time with our providers, please arrive at least 15 minutes before your scheduled appointment time.  With your help, our goal is to use those 15 minutes to complete the necessary work-up to ensure our physicians have the information they need to help with your evaluation and healthcare recommendations.    Effective January 1st, 2014, we ask that you re-schedule your appointment with our physicians should you arrive 10 or more minutes late for your appointment.  We strive to give you quality time with our providers, and arriving late affects you and other patients whose appointments are after yours.    Again, thank you for choosing [facility].  Our hope is that these requests will decrease the amount of time that you wait before being seen by our physicians.       _____________________________________________________________  Should you have questions after your visit to [facility], please contact our office at [phone] between the hours of 8:30 a.m. and 5:00 p.m.  Voicemails left after 4:30 p.m. will not be returned until the following business day.  For prescription refill requests, have your pharmacy contact our office with your prescription refill request.

## 2012-08-06 ENCOUNTER — Ambulatory Visit (INDEPENDENT_AMBULATORY_CARE_PROVIDER_SITE_OTHER): Payer: Medicare Other | Admitting: Family Medicine

## 2012-08-06 VITALS — BP 130/70 | HR 62 | Resp 18 | Ht 65.0 in | Wt 305.0 lb

## 2012-08-06 DIAGNOSIS — M545 Low back pain, unspecified: Secondary | ICD-10-CM | POA: Diagnosis not present

## 2012-08-06 DIAGNOSIS — I10 Essential (primary) hypertension: Secondary | ICD-10-CM | POA: Diagnosis not present

## 2012-08-06 DIAGNOSIS — E119 Type 2 diabetes mellitus without complications: Secondary | ICD-10-CM

## 2012-08-06 DIAGNOSIS — C801 Malignant (primary) neoplasm, unspecified: Secondary | ICD-10-CM

## 2012-08-06 DIAGNOSIS — C7951 Secondary malignant neoplasm of bone: Secondary | ICD-10-CM

## 2012-08-06 DIAGNOSIS — C50919 Malignant neoplasm of unspecified site of unspecified female breast: Secondary | ICD-10-CM

## 2012-08-06 LAB — HEMOGLOBIN A1C, FINGERSTICK: Hgb A1C (fingerstick): 13.7 % — ABNORMAL HIGH (ref <5.7)

## 2012-08-07 ENCOUNTER — Encounter: Payer: Self-pay | Admitting: Family Medicine

## 2012-08-07 DIAGNOSIS — M545 Low back pain, unspecified: Secondary | ICD-10-CM | POA: Insufficient documentation

## 2012-08-07 MED ORDER — OXYCODONE-ACETAMINOPHEN 7.5-325 MG PO TABS: 1.0000 | ORAL_TABLET | ORAL | Status: DC | PRN

## 2012-08-07 NOTE — Progress Notes (Signed)
  Subjective:    Patient ID: Kaylee Mitchell, female    DOB: [birth year], 57 y.o.   MRN: [id number]  HPI  Patient here to follow chronic medical problems of note the [place] system was down to this note is being dictated 24 hours after visit. Diabetes mellitus last A1c was 10% she's currently using Lantus 45 units twice a day and Humalog 10 units with lunch and dinner. She does miss her morning dose of Lantus about twice a week secondary to getting a plate. Last week her CBGs were in the 400s because she was out of insulin. Yesterday fasting CBG was 99. Denies hypoglycemia.  Back pain she's been having back pain on and off she was seen in the emergency room concern for metastatic breast cancer oncology has ordered a bone scan. She is out of her pain medication. Review of Systems   GEN- denies fatigue, fever, weight loss,weakness, recent illness HEENT- denies eye drainage, change in vision, nasal discharge, CVS- denies chest pain, palpitations RESP- denies SOB, cough, wheeze ABD- denies N/V, change in stools, abd pain GU- denies dysuria, hematuria, dribbling, incontinence MSK- + joint pain, muscle aches, injury Neuro- denies headache, dizziness, syncope, seizure activity      Objective:   Physical Exam GEN- NAD, alert and oriented x3 HEENT- PERRL, EOMI, non injected sclera, pink conjunctiva, MMM, oropharynx clear Neck- Supple,  CVS- RRR, no murmur RESP-CTAB MSK- Back- TTP lumbar spine, no spasm, +paraspinal tenderness, Neg SLR,  Neuro- Strength equal bilat LE, DTR symmetric EXT- No edema Pulses- Radial, DP- 2+ Diabetic foot- Normal monofilament, nails Long and thick      Assessment & Plan:

## 2012-08-07 NOTE — Assessment & Plan Note (Signed)
Bone scan pending, f/u oncology, pain meds per above

## 2012-08-07 NOTE — Assessment & Plan Note (Signed)
Uncontrolled, increase lantus to 50 units twice a day, humalog 15 units, refer to endocrine On ACE and ASA

## 2012-08-07 NOTE — Assessment & Plan Note (Signed)
Per above concern for metastatic disease to the lumbar spine. I have refilled her Percocet #90 tablets

## 2012-08-07 NOTE — Assessment & Plan Note (Signed)
well controlled  

## 2012-08-07 NOTE — Assessment & Plan Note (Signed)
Discussed weight loss and nutrition. Her diabetes drastically needs to improve she also needs to lose weight

## 2012-08-23 ENCOUNTER — Encounter (HOSPITAL_BASED_OUTPATIENT_CLINIC_OR_DEPARTMENT_OTHER): Payer: Medicare Other

## 2012-08-23 ENCOUNTER — Encounter (HOSPITAL_COMMUNITY): Payer: Medicare Other | Attending: Oncology

## 2012-08-23 DIAGNOSIS — C7951 Secondary malignant neoplasm of bone: Secondary | ICD-10-CM | POA: Diagnosis not present

## 2012-08-23 DIAGNOSIS — C50219 Malignant neoplasm of upper-inner quadrant of unspecified female breast: Secondary | ICD-10-CM | POA: Diagnosis not present

## 2012-08-23 DIAGNOSIS — C50919 Malignant neoplasm of unspecified site of unspecified female breast: Secondary | ICD-10-CM

## 2012-08-23 DIAGNOSIS — C801 Malignant (primary) neoplasm, unspecified: Secondary | ICD-10-CM | POA: Diagnosis not present

## 2012-08-23 DIAGNOSIS — C7952 Secondary malignant neoplasm of bone marrow: Secondary | ICD-10-CM | POA: Insufficient documentation

## 2012-08-23 LAB — CBC WITH DIFFERENTIAL/PLATELET
Basophils Relative: 0 % (ref 0–1)
Eosinophils Absolute: 0.2 10*3/uL (ref 0.0–0.7)
MCH: 27.6 pg (ref 26.0–34.0)
MCHC: 31.9 g/dL (ref 30.0–36.0)
Neutro Abs: 2.9 10*3/uL (ref 1.7–7.7)
Neutrophils Relative %: 50 % (ref 43–77)
Platelets: 167 10*3/uL (ref 150–400)
RBC: 4.42 MIL/uL (ref 3.87–5.11)

## 2012-08-23 LAB — COMPREHENSIVE METABOLIC PANEL
ALT: 13 U/L (ref 0–35)
AST: 19 U/L (ref 0–37)
Albumin: 3.1 g/dL — ABNORMAL LOW (ref 3.5–5.2)
Alkaline Phosphatase: 75 U/L (ref 39–117)
Potassium: 3.7 mEq/L (ref 3.5–5.1)
Sodium: 140 mEq/L (ref 135–145)
Total Protein: 7.1 g/dL (ref 6.0–8.3)

## 2012-08-23 MED ORDER — DENOSUMAB 120 MG/1.7ML ~~LOC~~ SOLN
120.0000 mg | Freq: Once | SUBCUTANEOUS | Status: AC
  Administered 2012-08-23: 120 mg via SUBCUTANEOUS
  Filled 2012-08-23: qty 1.7

## 2012-08-23 NOTE — Progress Notes (Signed)
Kaylee Mitchell presents today for injection per MD orders. Xgeva 120 mg administered SQ in right Abdomen. Administration without incident. Patient tolerated well.  

## 2012-08-23 NOTE — Progress Notes (Signed)
Labs drawn today for cbc/diff,cmp,[id number] 

## 2012-08-24 NOTE — Progress Notes (Signed)
Attempted to call patient, no answer 

## 2012-08-28 DIAGNOSIS — L851 Acquired keratosis [keratoderma] palmaris et plantaris: Secondary | ICD-10-CM | POA: Diagnosis not present

## 2012-08-28 DIAGNOSIS — E1159 Type 2 diabetes mellitus with other circulatory complications: Secondary | ICD-10-CM | POA: Diagnosis not present

## 2012-08-28 DIAGNOSIS — B351 Tinea unguium: Secondary | ICD-10-CM | POA: Diagnosis not present

## 2012-09-07 ENCOUNTER — Other Ambulatory Visit (HOSPITAL_COMMUNITY): Payer: Self-pay | Admitting: Oncology

## 2012-09-07 ENCOUNTER — Encounter (HOSPITAL_COMMUNITY): Payer: Self-pay

## 2012-09-07 ENCOUNTER — Encounter (HOSPITAL_COMMUNITY)
Admission: RE | Admit: 2012-09-07 | Discharge: 2012-09-07 | Disposition: A | Discharge status: Home / Self Care | Payer: Medicare Other | Source: Ambulatory Visit | Attending: Oncology | Admitting: Oncology

## 2012-09-07 DIAGNOSIS — C50919 Malignant neoplasm of unspecified site of unspecified female breast: Secondary | ICD-10-CM

## 2012-09-07 DIAGNOSIS — C7951 Secondary malignant neoplasm of bone: Secondary | ICD-10-CM

## 2012-09-07 MED ORDER — OXYCODONE-ACETAMINOPHEN 7.5-325 MG PO TABS: 1.0000 | ORAL_TABLET | ORAL | Status: DC | PRN

## 2012-09-07 MED ORDER — TECHNETIUM TC 99M MEDRONATE IV KIT
25.0000 | PACK | Freq: Once | INTRAVENOUS | Status: AC | PRN
  Administered 2012-09-07: 25 via INTRAVENOUS

## 2012-09-14 ENCOUNTER — Ambulatory Visit: Payer: Medicare Other | Admitting: Family Medicine

## 2012-09-18 ENCOUNTER — Ambulatory Visit: Payer: Medicare Other | Admitting: Family Medicine

## 2012-09-20 ENCOUNTER — Encounter: Payer: Self-pay | Admitting: Family Medicine

## 2012-09-20 ENCOUNTER — Encounter (HOSPITAL_COMMUNITY): Payer: Self-pay

## 2012-09-20 ENCOUNTER — Encounter (HOSPITAL_BASED_OUTPATIENT_CLINIC_OR_DEPARTMENT_OTHER): Payer: Medicare Other

## 2012-09-20 ENCOUNTER — Encounter (HOSPITAL_COMMUNITY): Payer: Medicare Other | Attending: Oncology

## 2012-09-20 VITALS — BP 123/72 | HR 100 | Temp 98.5°F | Resp 16 | Wt 305.5 lb

## 2012-09-20 DIAGNOSIS — C7951 Secondary malignant neoplasm of bone: Secondary | ICD-10-CM

## 2012-09-20 DIAGNOSIS — C50219 Malignant neoplasm of upper-inner quadrant of unspecified female breast: Secondary | ICD-10-CM

## 2012-09-20 DIAGNOSIS — M25559 Pain in unspecified hip: Secondary | ICD-10-CM

## 2012-09-20 DIAGNOSIS — C801 Malignant (primary) neoplasm, unspecified: Secondary | ICD-10-CM | POA: Diagnosis not present

## 2012-09-20 DIAGNOSIS — R5381 Other malaise: Secondary | ICD-10-CM | POA: Diagnosis not present

## 2012-09-20 DIAGNOSIS — E669 Obesity, unspecified: Secondary | ICD-10-CM

## 2012-09-20 DIAGNOSIS — C50919 Malignant neoplasm of unspecified site of unspecified female breast: Secondary | ICD-10-CM | POA: Insufficient documentation

## 2012-09-20 LAB — CANCER ANTIGEN 27.29: CA 27.29: 28 U/mL (ref 0–39)

## 2012-09-20 LAB — COMPREHENSIVE METABOLIC PANEL
Albumin: 3.1 g/dL — ABNORMAL LOW (ref 3.5–5.2)
BUN: 20 mg/dL (ref 6–23)
Chloride: 104 mEq/L (ref 96–112)
Creatinine, Ser: 1.25 mg/dL — ABNORMAL HIGH (ref 0.50–1.10)
GFR calc non Af Amer: 46 mL/min — ABNORMAL LOW (ref >90)
Total Bilirubin: 0.4 mg/dL (ref 0.3–1.2)

## 2012-09-20 LAB — CBC WITH DIFFERENTIAL/PLATELET
Basophils Relative: 0 % (ref 0–1)
Eosinophils Absolute: 0.2 10*3/uL (ref 0.0–0.7)
Eosinophils Relative: 2 % (ref 0–5)
HCT: 39.3 % (ref 36.0–46.0)
Hemoglobin: 12.9 g/dL (ref 12.0–15.0)
MCH: 28.2 pg (ref 26.0–34.0)
MCHC: 32.8 g/dL (ref 30.0–36.0)
MCV: 85.8 fL (ref 78.0–100.0)
Monocytes Absolute: 0.6 10*3/uL (ref 0.1–1.0)
Monocytes Relative: 8 % (ref 3–12)
Neutro Abs: 4 10*3/uL (ref 1.7–7.7)

## 2012-09-20 MED ORDER — DENOSUMAB 120 MG/1.7ML ~~LOC~~ SOLN
120.0000 mg | Freq: Once | SUBCUTANEOUS | Status: AC
  Administered 2012-09-20: 120 mg via SUBCUTANEOUS
  Filled 2012-09-20: qty 1.7

## 2012-09-20 NOTE — Progress Notes (Signed)
Kaylee Mitchell presents today for injection per MD orders. Xgeva 120 mg administered SQ in left Abdomen. Administration without incident. Patient tolerated well.  

## 2012-09-20 NOTE — Patient Instructions (Addendum)
[facility] Discharge Instructions  RECOMMENDATIONS MADE BY THE CONSULTANT AND ANY TEST RESULTS WILL BE SENT TO YOUR REFERRING PHYSICIAN.  EXAM FINDINGS BY THE PHYSICIAN TODAY AND SIGNS OR SYMPTOMS TO REPORT TO CLINIC OR PRIMARY PHYSICIAN: Exam and discussion by Dr. Sharia Reeve.  You are doing well.  No changes recommended at this time  MEDICATIONS PRESCRIBED:  none  INSTRUCTIONS GIVEN AND DISCUSSED: Report any new lumps, bone pain, shortness of breath or other symptoms.  SPECIAL INSTRUCTIONS/FOLLOW-UP: Labs and xgeva monthy and follow-up in 2 months.  Thank you for choosing [facility] to provide your oncology and hematology care.  To afford each patient quality time with our providers, please arrive at least 15 minutes before your scheduled appointment time.  With your help, our goal is to use those 15 minutes to complete the necessary work-up to ensure our physicians have the information they need to help with your evaluation and healthcare recommendations.    Effective January 1st, 2014, we ask that you re-schedule your appointment with our physicians should you arrive 10 or more minutes late for your appointment.  We strive to give you quality time with our providers, and arriving late affects you and other patients whose appointments are after yours.    Again, thank you for choosing [facility].  Our hope is that these requests will decrease the amount of time that you wait before being seen by our physicians.       _____________________________________________________________  Should you have questions after your visit to [facility], please contact our office at [phone] between the hours of 8:30 a.m. and 5:00 p.m.  Voicemails left after 4:30 p.m. will not be returned until the following business day.  For prescription refill requests, have your pharmacy contact our office with your prescription refill request.

## 2012-09-20 NOTE — Progress Notes (Signed)
[facility] Telephone:([phone]   Fax:([phone]  OFFICE PROGRESS NOTE  [name], MD [address] [place] [address]  DIAGNOSIS: Metastatic breast cancer.   ONCOLOGIC HISTORY: According to note from Dr Mariel Sleet;She presented with a 7 cm tumor of the right breast, extensive LVI. Tumor focally  extended to the anterior surface margin, 8 of 8 lymph nodes were involved with metastatic disease. She was ER positive 100%, PR  positive 99%, HER2 neu negative, Ki-67 marker 14%. No liver involvement, no lung involvement and she had a mastectomy followed  by radiation therapy to T4 through T7 due to severe involvement at this area. She was treated with Arimidex initially from 07/2009 through 03/23/2010 and was progressing by bone scan criteria. We switched her to tamoxifen which she is tolerating well and her bone  scan is now stable. She has not received radiation therapy to the chest wall or axilla .  She has continued to tamoxifen since then.   INTERVAL HISTORY:   Kaylee Mitchell 57 y.o. female returns to the clinic today for for scheduled followup and to review bone scan result which is detailed below in the radiologic section.  She continues to complain of right sided hip pain which is unchanged.  His pain is really back in 6.  She tells me she is scheduled to see her  Primary doctor very soon.  She complains of chronic fatigue and denies smoking.   MEDICAL HISTORY: Past Medical History  Diagnosis Date  . Hypertension   . High cholesterol   . Knee pain, bilateral 2013  . Cancer     rt breast is primary  . Bone metastasis 09/13/2010  . Diabetes mellitus     type II  . Depression   . Depression 12/14/2011    ALLERGIES:  has No Known Allergies.  MEDICATIONS:  Current Outpatient Prescriptions  Medication Sig Dispense Refill  . aspirin EC 81 MG tablet Take 81 mg by mouth daily.        Marland Kitchen atorvastatin (LIPITOR) 20 MG tablet Take 1 tablet (20  mg total) by mouth daily.  90 tablet  1  . calcium-vitamin D (OSCAL WITH D) 500-200 MG-UNIT per tablet Take 1 tablet by mouth daily.        . carvedilol (COREG) 12.5 MG tablet Take 1 tablet (12.5 mg total) by mouth 2 (two) times daily with a meal.  180 tablet  1  . glipiZIDE (GLUCOTROL XL) 10 MG 24 hr tablet Take 1 tablet (10 mg total) by mouth daily.  90 tablet  1  . ibuprofen (ADVIL,MOTRIN) 800 MG tablet Take 1 tablet (800 mg total) by mouth at bedtime as needed for pain.  30 tablet  2  . Insulin Glargine (LANTUS SOLOSTAR [place]) Inject 50 Units into the skin 2 (two) times daily.      . insulin lispro (HUMALOG) 100 UNIT/ML injection Inject 15 Units into the skin 2 (two) times daily. Inject 15 units at lunch and 15 units at dinner      . lisinopril-hydrochlorothiazide (PRINZIDE,ZESTORETIC) 10-12.5 MG per tablet Take 1 tablet by mouth daily. For blood pressure  90 tablet  1  . oxyCODONE-acetaminophen (PERCOCET) 7.5-325 MG per tablet Take 1 tablet by mouth every 4 (four) hours as needed for pain.  90 tablet  0  . sertraline (ZOLOFT) 100 MG tablet Take 1 tablet (100 mg total) by mouth daily. For depression and anxiety  90 tablet  1  . tamoxifen (NOLVADEX) 20 MG tablet Take 1 tablet (20 mg total) by mouth daily.  90 tablet  2  . [DISCONTINUED] loratadine (CLARITIN) 10 MG tablet Take 10 mg by mouth daily as needed. For allergies       No current facility-administered medications for this visit.   Facility-Administered Medications Ordered in Other Visits  Medication Dose Route Frequency Provider Last Rate Last Dose  . 1 mL EPINEPHrine 1 mg/mL (1:1000) in 0.9% Normal Saline 3000 mL irrigation    PRN Vickki Hearing, MD        SURGICAL HISTORY:  Past Surgical History  Procedure Laterality Date  . Knee surgery      right-arthroscopy  . Mastectomy      right side  . Abdominal hysterectomy       REVIEW OF SYSTEMS: 14 point review of system is as in the history above otherwise  negative.   PHYSICAL EXAMINATION:  Blood pressure 123/72, pulse 100, temperature 98.5 F (36.9 C), temperature source Oral, resp. rate 16, weight 305 lb 8 oz (138.574 kg). GENERAL: No acute distress. Severely obese SKIN:  No rashes or significant lesions . No ecchymosis or petechial rash. HEAD: Normocephalic, No masses, lesions, tenderness or abnormalities  EYES: Conjunctiva are pink and non-injected and no jaundice ENT: External ears normal ,lips, buccal mucosa, and tongue normal and mucous membranes are moist . No evidence of thrush. LYMPH: No palpable lymphadenopathy, in the neck, supraclavicular areas or axilla. LUNGS: Clear to auscultation , no crackles or wheezes HEART: regular rate & rhythm, no murmurs, no gallops, S1 normal and S2 normal and no S3. ABDOMEN: Abdomen soft, non-tender, no masses or organomegaly and no hepatosplenomegaly palpable MSK: No CVA tenderness and no tenderness on percussion of the back or rib cage. EXTREMITIES: No edema, no skin discoloration or tenderness NEURO: Alert & oriented , no focal motor  deficits.     LABORATORY DATA: Lab Results  Component Value Date   WBC 7.7 09/20/2012   HGB 12.9 09/20/2012   HCT 39.3 09/20/2012   MCV 85.8 09/20/2012   PLT 177 09/20/2012      Chemistry      Component Value Date/Time   NA 140 09/20/2012 0834   K 3.8 09/20/2012 0834   CL 104 09/20/2012 0834   CO2 27 09/20/2012 0834   BUN 20 09/20/2012 0834   CREATININE 1.25* 09/20/2012 0834      Component Value Date/Time   CALCIUM 9.9 09/20/2012 0834   ALKPHOS 74 09/20/2012 0834   AST 21 09/20/2012 0834   ALT 13 09/20/2012 0834   BILITOT 0.4 09/20/2012 0834       RADIOGRAPHIC STUDIES: Nm Bone Scan Whole Body  09/07/2012   CLINICAL DATA:  57 year old female with metastatic breast cancer.  EXAM: NUCLEAR MEDICINE WHOLE BODY BONE SCINTIGRAPHY  TECHNIQUE: Whole body anterior and posterior images were obtained approximately 3 hours after intravenous injection of radiopharmaceutical.   COMPARISON:  03/05/2012 and earlier.  RADIOPHARMACEUTICALS:  CURIE TC-MDP TECHNETIUM TC 82M MEDRONATE IV KIT  FINDINGS: Expected radiotracer activity in the kidneys and bladder.  Skull activity is stable since February, no residual discrete metastasis is evident.  Increased homogeneity of cervical and thoracic spine activity, with regression of the abnormal upper thoracic foci since 2013.  Stable to the mildly further decreased foci of increased activity in the lumbar spine. Bilateral medial sacral activity is not significantly changed.  Subtle residual asymmetric right lateral rib activity. Probable degenerative sternoclavicular activity is stable.  Proximal left humerus shaft focus of activity is stable to mildly further decreased.  No other suspicious appendicular skeleton activity.  IMPRESSION: Regression of abnormal skeletal radiotracer activity related to metastatic disease since 2013, and the appearance is stable to mildly further regressed since 03/05/2012. No new metastatic focus identified.   Electronically Signed   By: Augusto Gamble   On: 09/07/2012 13:29     ASSESSMENT:  Patient has no evidence of disease progression at this time. The right hip pain is likley related to severe obesity and possible which DJD associated with that.  Bone scan does not show any evidence of disease progression actually improvement from 2013 in metastatic disease sites. Fatigue I think is most likely from inactivity and obesity.   PLAN:  1. Continue tamoxifen.  2. Continued Xgeva 3. Patient canceled her weight loss.     All questions were satisfactorily answered. Patient knows to call if  any concern arises.  I spent more than 50 % counseling the patient face to face. The total time spent in the appointment was 30 minutes.   Sherral Hammers, MD FACP. Hematology/Oncology.

## 2012-09-20 NOTE — Progress Notes (Signed)
Labs drawn today for cbc/diff,[id number] 

## 2012-09-27 DIAGNOSIS — E1129 Type 2 diabetes mellitus with other diabetic kidney complication: Secondary | ICD-10-CM | POA: Diagnosis not present

## 2012-10-04 DIAGNOSIS — E785 Hyperlipidemia, unspecified: Secondary | ICD-10-CM | POA: Diagnosis not present

## 2012-10-04 DIAGNOSIS — E669 Obesity, unspecified: Secondary | ICD-10-CM | POA: Diagnosis not present

## 2012-10-04 DIAGNOSIS — E1129 Type 2 diabetes mellitus with other diabetic kidney complication: Secondary | ICD-10-CM | POA: Diagnosis not present

## 2012-10-04 DIAGNOSIS — I10 Essential (primary) hypertension: Secondary | ICD-10-CM | POA: Diagnosis not present

## 2012-10-09 ENCOUNTER — Other Ambulatory Visit (HOSPITAL_COMMUNITY): Payer: Self-pay | Admitting: Oncology

## 2012-10-09 DIAGNOSIS — Z139 Encounter for screening, unspecified: Secondary | ICD-10-CM

## 2012-10-19 ENCOUNTER — Encounter (HOSPITAL_BASED_OUTPATIENT_CLINIC_OR_DEPARTMENT_OTHER): Payer: Medicare Other

## 2012-10-19 ENCOUNTER — Encounter (HOSPITAL_COMMUNITY): Payer: Medicare Other | Attending: Oncology

## 2012-10-19 ENCOUNTER — Other Ambulatory Visit (HOSPITAL_COMMUNITY): Payer: Self-pay | Admitting: Oncology

## 2012-10-19 VITALS — BP 144/91 | HR 99 | Resp 18 | Wt 307.8 lb

## 2012-10-19 DIAGNOSIS — C50219 Malignant neoplasm of upper-inner quadrant of unspecified female breast: Secondary | ICD-10-CM

## 2012-10-19 DIAGNOSIS — C7951 Secondary malignant neoplasm of bone: Secondary | ICD-10-CM | POA: Insufficient documentation

## 2012-10-19 DIAGNOSIS — C50919 Malignant neoplasm of unspecified site of unspecified female breast: Secondary | ICD-10-CM

## 2012-10-19 DIAGNOSIS — C801 Malignant (primary) neoplasm, unspecified: Secondary | ICD-10-CM | POA: Diagnosis not present

## 2012-10-19 LAB — COMPREHENSIVE METABOLIC PANEL
ALT: 16 U/L (ref 0–35)
AST: 24 U/L (ref 0–37)
Alkaline Phosphatase: 68 U/L (ref 39–117)
CO2: 26 mEq/L (ref 19–32)
Calcium: 9.4 mg/dL (ref 8.4–10.5)
GFR calc non Af Amer: 48 mL/min — ABNORMAL LOW (ref >90)
Glucose, Bld: 127 mg/dL — ABNORMAL HIGH (ref 70–99)
Potassium: 4.1 mEq/L (ref 3.5–5.1)
Sodium: 139 mEq/L (ref 135–145)

## 2012-10-19 LAB — CBC WITH DIFFERENTIAL/PLATELET
Basophils Absolute: 0 10*3/uL (ref 0.0–0.1)
Eosinophils Relative: 3 % (ref 0–5)
Lymphocytes Relative: 39 % (ref 12–46)
Lymphs Abs: 2.5 10*3/uL (ref 0.7–4.0)
MCV: 87.4 fL (ref 78.0–100.0)
Neutro Abs: 3.2 10*3/uL (ref 1.7–7.7)
Neutrophils Relative %: 51 % (ref 43–77)
Platelets: 195 10*3/uL (ref 150–400)
RBC: 4.36 MIL/uL (ref 3.87–5.11)
RDW: 14.7 % (ref 11.5–15.5)
WBC: 6.4 10*3/uL (ref 4.0–10.5)

## 2012-10-19 MED ORDER — DENOSUMAB 120 MG/1.7ML ~~LOC~~ SOLN
120.0000 mg | Freq: Once | SUBCUTANEOUS | Status: AC
  Administered 2012-10-19: 120 mg via SUBCUTANEOUS
  Filled 2012-10-19: qty 1.7

## 2012-10-19 MED ORDER — OXYCODONE-ACETAMINOPHEN 7.5-325 MG PO TABS: 1.0000 | ORAL_TABLET | ORAL | Status: DC | PRN

## 2012-10-19 NOTE — Progress Notes (Signed)
Kaylee Mitchell presents today for injection per the provider's orders.  Xgeva administered administration without incident; see MAR for injection details.  Patient tolerated procedure well and without incident.  No questions or complaints noted at this time.

## 2012-10-19 NOTE — Progress Notes (Signed)
Labs drawn today for cbc/diff,cmp,[id number] 

## 2012-10-30 ENCOUNTER — Ambulatory Visit: Payer: Medicare Other | Admitting: Family Medicine

## 2012-11-01 DIAGNOSIS — I10 Essential (primary) hypertension: Secondary | ICD-10-CM | POA: Diagnosis not present

## 2012-11-01 DIAGNOSIS — E669 Obesity, unspecified: Secondary | ICD-10-CM | POA: Diagnosis not present

## 2012-11-01 DIAGNOSIS — E785 Hyperlipidemia, unspecified: Secondary | ICD-10-CM | POA: Diagnosis not present

## 2012-11-06 ENCOUNTER — Ambulatory Visit (INDEPENDENT_AMBULATORY_CARE_PROVIDER_SITE_OTHER): Payer: Medicare Other | Admitting: Family Medicine

## 2012-11-06 ENCOUNTER — Encounter: Payer: Self-pay | Admitting: Family Medicine

## 2012-11-06 VITALS — BP 120/80 | HR 86 | Temp 97.5°F | Resp 18 | Wt 307.0 lb

## 2012-11-06 DIAGNOSIS — J069 Acute upper respiratory infection, unspecified: Secondary | ICD-10-CM | POA: Diagnosis not present

## 2012-11-06 DIAGNOSIS — N189 Chronic kidney disease, unspecified: Secondary | ICD-10-CM

## 2012-11-06 DIAGNOSIS — E1129 Type 2 diabetes mellitus with other diabetic kidney complication: Secondary | ICD-10-CM | POA: Diagnosis not present

## 2012-11-06 DIAGNOSIS — E1122 Type 2 diabetes mellitus with diabetic chronic kidney disease: Secondary | ICD-10-CM

## 2012-11-06 DIAGNOSIS — I10 Essential (primary) hypertension: Secondary | ICD-10-CM

## 2012-11-06 DIAGNOSIS — E785 Hyperlipidemia, unspecified: Secondary | ICD-10-CM | POA: Diagnosis not present

## 2012-11-06 DIAGNOSIS — M545 Low back pain, unspecified: Secondary | ICD-10-CM

## 2012-11-06 LAB — LIPID PANEL
Cholesterol: 101 mg/dL (ref 0–200)
HDL: 33 mg/dL — ABNORMAL LOW (ref >39)
LDL Cholesterol: 53 mg/dL (ref 0–99)
Triglycerides: 77 mg/dL (ref <150)

## 2012-11-06 MED ORDER — CARVEDILOL 12.5 MG PO TABS: 12.5000 mg | ORAL_TABLET | Freq: Two times a day (BID) | ORAL | Status: DC

## 2012-11-06 MED ORDER — BENZONATATE 100 MG PO CAPS: 100.0000 mg | ORAL_CAPSULE | Freq: Two times a day (BID) | ORAL | Status: DC | PRN

## 2012-11-06 MED ORDER — INSULIN GLARGINE 100 UNIT/ML SOLOSTAR PEN: 50.0000 [IU] | PEN_INJECTOR | Freq: Every day | SUBCUTANEOUS | Status: DC

## 2012-11-06 MED ORDER — ATORVASTATIN CALCIUM 20 MG PO TABS: 20.0000 mg | ORAL_TABLET | Freq: Every day | ORAL | Status: DC

## 2012-11-06 MED ORDER — AZITHROMYCIN 250 MG PO TABS: ORAL_TABLET | ORAL | Status: DC

## 2012-11-06 MED ORDER — LISINOPRIL-HYDROCHLOROTHIAZIDE 10-12.5 MG PO TABS: 1.0000 | ORAL_TABLET | Freq: Every day | ORAL | Status: DC

## 2012-11-06 NOTE — Assessment & Plan Note (Signed)
Well controlled no change to meds 

## 2012-11-06 NOTE — Assessment & Plan Note (Signed)
Followed by endocrinology, will check A1C and FLP, it appears she may have missed a recent appt, has not had labs done

## 2012-11-06 NOTE — Assessment & Plan Note (Signed)
Chronic pain meds given by oncology this month

## 2012-11-06 NOTE — Assessment & Plan Note (Signed)
Check FLP, on statin drug, LFT normal at oncology office

## 2012-11-06 NOTE — Progress Notes (Signed)
  Subjective:    Patient ID: Kaylee Mitchell, female    DOB: February 18, 1955, 57 y.o.   MRN: [id number]  HPI  Patient here to follow chronic medical problems. She's been sick for the past 2 weeks. She started with cough sore throat which began to improve however worsened over the past week. Her cough is worse at night is productive of yellow sputum. She denies any fever shortness of breath or chest pain associated. She's also had some mild sinus drainage.  Diabetes mellitus she's been followed by endocrinology her last A1c was 13% she is due to have repeat A1c and fasting lipid panel today. She states her fasting blood sugars have been 80 to low 100s. No hypoglycemia  Chronic back pain secondary to bone metastasis she's been followed by oncology she is on Percocet Metastatic breast cancer- she's still getting her monthly injection for her metastatic disease  Declines flu, pneumonia, colonoscopy  Review of Systems   GEN- denies fatigue, fever, weight loss,weakness, recent illness HEENT- denies eye drainage, change in vision, +nasal discharge, CVS- denies chest pain, palpitations RESP- denies SOB, +cough, wheeze ABD- denies N/V, change in stools, abd pain GU- denies dysuria, hematuria, dribbling, incontinence MSK- + joint pain, muscle aches, injury Neuro- denies headache, dizziness, syncope, seizure activity       Objective:   Physical Exam  GEN- NAD, alert and oriented x3 HEENT- PERRL, EOMI, non injected sclera, pink conjunctiva, MMM, oropharynx clear, TM clear bilat, no effusion, no maxillary sinus tenderness, nares clear rhinorrhea Neck- Supple, no LAD CVS- RRR, no murmur RESP-CTAB EXT-trace edema Pulses- Radial, DP- 2+       Assessment & Plan:

## 2012-11-06 NOTE — Addendum Note (Signed)
Addended by: Elvina Mattes T on: 11/06/2012 10:17 AM   Modules accepted: Orders

## 2012-11-06 NOTE — Assessment & Plan Note (Signed)
Prolonged course and on immunosuppressants, will start zpak, tessalon perrles, no fever currently

## 2012-11-06 NOTE — Patient Instructions (Signed)
Take the tessalon perrles for cough Take antibiotic as prescribed We will call with lab results Referral to eye doctor Continue all other medications F/U 3 months

## 2012-11-15 ENCOUNTER — Other Ambulatory Visit (HOSPITAL_COMMUNITY): Payer: Medicare Other

## 2012-11-15 ENCOUNTER — Encounter (HOSPITAL_COMMUNITY): Payer: Self-pay | Admitting: Emergency Medicine

## 2012-11-15 ENCOUNTER — Encounter (HOSPITAL_COMMUNITY): Payer: Self-pay

## 2012-11-15 ENCOUNTER — Encounter (HOSPITAL_COMMUNITY): Payer: Medicare Other

## 2012-11-15 ENCOUNTER — Emergency Department (HOSPITAL_COMMUNITY): Payer: Medicare Other

## 2012-11-15 ENCOUNTER — Emergency Department (HOSPITAL_COMMUNITY)
Admission: EM | Admit: 2012-11-15 | Discharge: 2012-11-15 | Disposition: A | Discharge status: Home / Self Care | Payer: Medicare Other | Attending: Emergency Medicine | Admitting: Emergency Medicine

## 2012-11-15 ENCOUNTER — Encounter (HOSPITAL_BASED_OUTPATIENT_CLINIC_OR_DEPARTMENT_OTHER): Payer: Medicare Other

## 2012-11-15 VITALS — BP 137/91 | HR 101 | Temp 98.6°F | Resp 22 | Wt 304.0 lb

## 2012-11-15 DIAGNOSIS — C50219 Malignant neoplasm of upper-inner quadrant of unspecified female breast: Secondary | ICD-10-CM

## 2012-11-15 DIAGNOSIS — I10 Essential (primary) hypertension: Secondary | ICD-10-CM | POA: Insufficient documentation

## 2012-11-15 DIAGNOSIS — R109 Unspecified abdominal pain: Secondary | ICD-10-CM

## 2012-11-15 DIAGNOSIS — Z794 Long term (current) use of insulin: Secondary | ICD-10-CM | POA: Insufficient documentation

## 2012-11-15 DIAGNOSIS — M79609 Pain in unspecified limb: Secondary | ICD-10-CM | POA: Insufficient documentation

## 2012-11-15 DIAGNOSIS — E119 Type 2 diabetes mellitus without complications: Secondary | ICD-10-CM

## 2012-11-15 DIAGNOSIS — N959 Unspecified menopausal and perimenopausal disorder: Secondary | ICD-10-CM

## 2012-11-15 DIAGNOSIS — F329 Major depressive disorder, single episode, unspecified: Secondary | ICD-10-CM | POA: Diagnosis not present

## 2012-11-15 DIAGNOSIS — M549 Dorsalgia, unspecified: Secondary | ICD-10-CM | POA: Insufficient documentation

## 2012-11-15 DIAGNOSIS — Z79899 Other long term (current) drug therapy: Secondary | ICD-10-CM | POA: Insufficient documentation

## 2012-11-15 DIAGNOSIS — R112 Nausea with vomiting, unspecified: Secondary | ICD-10-CM | POA: Diagnosis not present

## 2012-11-15 DIAGNOSIS — Z853 Personal history of malignant neoplasm of breast: Secondary | ICD-10-CM | POA: Insufficient documentation

## 2012-11-15 DIAGNOSIS — R3989 Other symptoms and signs involving the genitourinary system: Secondary | ICD-10-CM | POA: Insufficient documentation

## 2012-11-15 DIAGNOSIS — M25559 Pain in unspecified hip: Secondary | ICD-10-CM | POA: Insufficient documentation

## 2012-11-15 DIAGNOSIS — C50911 Malignant neoplasm of unspecified site of right female breast: Secondary | ICD-10-CM

## 2012-11-15 DIAGNOSIS — C7951 Secondary malignant neoplasm of bone: Secondary | ICD-10-CM

## 2012-11-15 DIAGNOSIS — F3289 Other specified depressive episodes: Secondary | ICD-10-CM | POA: Insufficient documentation

## 2012-11-15 DIAGNOSIS — R1031 Right lower quadrant pain: Secondary | ICD-10-CM | POA: Insufficient documentation

## 2012-11-15 DIAGNOSIS — Z7982 Long term (current) use of aspirin: Secondary | ICD-10-CM | POA: Diagnosis not present

## 2012-11-15 DIAGNOSIS — Z85830 Personal history of malignant neoplasm of bone: Secondary | ICD-10-CM | POA: Diagnosis not present

## 2012-11-15 DIAGNOSIS — C50919 Malignant neoplasm of unspecified site of unspecified female breast: Secondary | ICD-10-CM

## 2012-11-15 DIAGNOSIS — E7800 Pure hypercholesterolemia, unspecified: Secondary | ICD-10-CM | POA: Diagnosis not present

## 2012-11-15 LAB — CBC WITH DIFFERENTIAL/PLATELET
Basophils Absolute: 0 10*3/uL (ref 0.0–0.1)
Basophils Relative: 0 % (ref 0–1)
Eosinophils Absolute: 0.2 10*3/uL (ref 0.0–0.7)
Eosinophils Relative: 2 % (ref 0–5)
HCT: 37.8 % (ref 36.0–46.0)
Hemoglobin: 12.3 g/dL (ref 12.0–15.0)
Lymphocytes Relative: 40 % (ref 12–46)
MCH: 28.2 pg (ref 26.0–34.0)
MCHC: 32.5 g/dL (ref 30.0–36.0)
MCV: 86.7 fL (ref 78.0–100.0)
Monocytes Absolute: 0.7 10*3/uL (ref 0.1–1.0)
Monocytes Relative: 9 % (ref 3–12)
RDW: 14.3 % (ref 11.5–15.5)

## 2012-11-15 LAB — COMPREHENSIVE METABOLIC PANEL
AST: 37 U/L (ref 0–37)
Albumin: 3 g/dL — ABNORMAL LOW (ref 3.5–5.2)
BUN: 16 mg/dL (ref 6–23)
Calcium: 9.5 mg/dL (ref 8.4–10.5)
Creatinine, Ser: 1.1 mg/dL (ref 0.50–1.10)
Total Protein: 7.6 g/dL (ref 6.0–8.3)

## 2012-11-15 LAB — URINALYSIS, ROUTINE W REFLEX MICROSCOPIC
Glucose, UA: 100 mg/dL — AB
Hgb urine dipstick: NEGATIVE
Leukocytes, UA: NEGATIVE
Specific Gravity, Urine: >1.03 — ABNORMAL HIGH (ref 1.005–1.030)
Urobilinogen, UA: 0.2 mg/dL (ref 0.0–1.0)
pH: 6 (ref 5.0–8.0)

## 2012-11-15 LAB — LIPASE, BLOOD: Lipase: 16 U/L (ref 11–59)

## 2012-11-15 MED ORDER — DENOSUMAB 120 MG/1.7ML ~~LOC~~ SOLN
120.0000 mg | Freq: Once | SUBCUTANEOUS | Status: AC
  Administered 2012-11-15: 120 mg via SUBCUTANEOUS
  Filled 2012-11-15: qty 1.7

## 2012-11-15 MED ORDER — VENLAFAXINE HCL ER 37.5 MG PO CP24: 37.5000 mg | ORAL_CAPSULE | Freq: Every day | ORAL | Status: DC

## 2012-11-15 MED ORDER — KETOROLAC TROMETHAMINE 30 MG/ML IJ SOLN
30.0000 mg | Freq: Once | INTRAMUSCULAR | Status: AC
  Administered 2012-11-15: 30 mg via INTRAVENOUS
  Filled 2012-11-15: qty 1

## 2012-11-15 MED ORDER — NAPROXEN 500 MG PO TABS: 500.0000 mg | ORAL_TABLET | Freq: Two times a day (BID) | ORAL | Status: DC

## 2012-11-15 MED ORDER — ONDANSETRON HCL 4 MG/2ML IJ SOLN
4.0000 mg | Freq: Once | INTRAMUSCULAR | Status: AC
  Administered 2012-11-15: 4 mg via INTRAVENOUS
  Filled 2012-11-15: qty 2

## 2012-11-15 MED ORDER — OXYCODONE HCL 10 MG PO TABS: 10.0000 mg | ORAL_TABLET | ORAL | Status: DC | PRN

## 2012-11-15 MED ORDER — OXYCODONE HCL 10 MG PO TABS: 5.0000 mg | ORAL_TABLET | ORAL | Status: DC | PRN

## 2012-11-15 NOTE — Progress Notes (Signed)
Kaylee Mitchell presents today for injection per MD orders. xgeva 120 mg administered SQ in right Abdomen. Administration without incident. Patient tolerated well.

## 2012-11-15 NOTE — Patient Instructions (Signed)
.  [facility] Discharge Instructions  RECOMMENDATIONS MADE BY THE CONSULTANT AND ANY TEST RESULTS WILL BE SENT TO YOUR REFERRING PHYSICIAN.  EXAM FINDINGS BY THE PHYSICIAN TODAY AND SIGNS OR SYMPTOMS TO REPORT TO CLINIC OR PRIMARY PHYSICIAN:  Exam and findings as discussed by Dr. Zigmund Daniel.  MEDICATIONS PRESCRIBED:  xgeva 120 mg  INSTRUCTIONS/FOLLOW-UP: Bone Scans MRI lumbar sacral spine  Thank you for choosing [facility] to provide your oncology and hematology care.  To afford each patient quality time with our providers, please arrive at least 15 minutes before your scheduled appointment time.  With your help, our goal is to use those 15 minutes to complete the necessary work-up to ensure our physicians have the information they need to help with your evaluation and healthcare recommendations.    Effective January 1st, 2014, we ask that you re-schedule your appointment with our physicians should you arrive 10 or more minutes late for your appointment.  We strive to give you quality time with our providers, and arriving late affects you and other patients whose appointments are after yours.    Again, thank you for choosing [facility].  Our hope is that these requests will decrease the amount of time that you wait before being seen by our physicians.       _____________________________________________________________  Should you have questions after your visit to [facility], please contact our office at [phone] between the hours of 8:30 a.m. and 5:00 p.m.  Voicemails left after 4:30 p.m. will not be returned until the following business day.  For prescription refill requests, have your pharmacy contact our office with your prescription refill request.

## 2012-11-15 NOTE — ED Notes (Signed)
Right sided abdominal pain times one week.  Pain getting worse.  Vomited times one.

## 2012-11-15 NOTE — Progress Notes (Signed)
[facility]  OFFICE PROGRESS NOTE  [name], MD [address] [place] [address]  DIAGNOSIS: Breast CA, right  Bone metastasis  Type II or unspecified type diabetes mellitus without mention of complication, not stated as uncontrolled  Chief Complaint  Patient presents with  . Abdominal Pain    CURRENT THERAPY: Tamoxifen 20 mg daily.  INTERVAL HISTORY: Kaylee Mitchell 57 y.o. female returns for followup of stage IV breast cancer with bone metastases while taking tamoxifen 20 mg daily since March of 2012 prior to which she was treated with anastrozole with evidence of progression in March of 2012. She has had radiotherapy to thoracic spine (T4-T7). She lifted a heavy laundry basket yesterday and doing and I develop pain in her back which was not relieved by Percocet. The pain was located in the lumbar area toward the right and radiating to both groins with radiation down the right lateral upper thigh. She denies any leg weakness. She was seen in the emergency room earlier this morning and a CT scan of the abdomen was done looking for possible kidney stone. The study was negative for gallstones or kidney stones but the official report indicated "worsening sclerotic bone metastases". She is taking tamoxifen 20 mg daily with hot flashes both in the daylight and at night. She was never off of therapy for this. She denies a diarrhea, constipation, urinary hesitancy, cough, wheezing, nasal drip, sore throat, skin rash, headache, or seizures.     MEDICAL HISTORY: Past Medical History  Diagnosis Date  . Hypertension   . High cholesterol   . Knee pain, bilateral 2013  . Cancer     rt breast is primary  . Bone metastasis 09/13/2010  . Diabetes mellitus     type II  . Depression   . Depression 12/14/2011    INTERIM HISTORY: has Breast CA; Bone metastasis; Medial meniscus, posterior horn derangement; S/P arthroscopy of left knee;  Difficulty in walking; Depression; Diabetes mellitus with chronic kidney disease; CKD (chronic kidney disease); Hyperlipidemia; Essential hypertension, benign; Morbid obesity; Tinea versicolor; Lumbar back pain; and URI, acute on her problem list.   7 cm tumor of the right breast, extensive LVI. Tumor focally  extended to the anterior surface margin, 8 of 8 lymph nodes were involved with metastatic disease. She was ER positive 100%, PR  positive 99%, HER2 neu negative, Ki-67 marker 14%. No liver involvement, no lung involvement and she had a mastectomy followed  by radiation therapy to T4 through T7 due to severe involvement at this area. She was treated with Arimidex initially from 07/2009 through 03/23/2010 and was progressing by bone scan criteria. We switched her to tamoxifen which she is tolerating well and her bone  scan is now stable. She has not received radiation therapy to the chest wall or axilla .  She has continued to tamoxifen since then.  ALLERGIES:  has No Known Allergies.  MEDICATIONS: has a current medication list which includes the following prescription(s): aspirin ec, atorvastatin, benzonatate, calcium-vitamin d, carvedilol, insulin glargine, insulin lispro, lisinopril-hydrochlorothiazide, naproxen, oxycodone-acetaminophen, sertraline, and tamoxifen, and the following Facility-Administered Medications: 1 mL EPINEPHrine 1 mg/mL (1:1000) in 0.9% Normal Saline 3000 mL irrigation and denosumab.  SURGICAL HISTORY:  Past Surgical History  Procedure Laterality Date  . Knee surgery      right-arthroscopy  . Mastectomy      right side  . Abdominal hysterectomy  FAMILY HISTORY: family history includes Arthritis in an other family member; Cancer in her maternal aunt and another family member; Diabetes in her father, mother, and another family member. There is no history of Anesthesia problems, Hypotension, Malignant hyperthermia, or Pseudochol deficiency.  SOCIAL HISTORY:   reports that she has never smoked. She has never used smokeless tobacco. She reports that she does not drink alcohol or use illicit drugs.  REVIEW OF SYSTEMS:  Other than that discussed above is noncontributory.  PHYSICAL EXAMINATION: ECOG PERFORMANCE STATUS: 1 - Symptomatic but completely ambulatory  Blood pressure 137/91, pulse 101, temperature 98.6 F (37 C), resp. rate 22, weight 304 lb (137.893 kg).  GENERAL:alert, moderate distress from back pain.. Morbidly obese  SKIN: skin color, texture, turgor are normal, no rashes or significant lesions EYES: PERLA; Conjunctiva are pink and non-injected, sclera clear OROPHARYNX:no exudate, no erythema on lips, buccal mucosa, or tongue. NECK: supple, thyroid normal size, non-tender, without nodularity. No masses CHEST:  STATUS post right mastectomy with no subcutaneous nodules per  LYMPH:  no palpable lymphadenopathy in the cervical, axillary or inguinal LUNGS: clear to auscultation and percussion with normal breathing effort HEART: regular rate & rhythm and no murmurs and no lower extremity edema ABDOMEN:abdomen soft, non-tender and normal bowel sounds MUSCULOSKELETAL:no cyanosis of digits and no clubbing. Range of motion normal. No lymphedema.Marland Kitchen  NEURO: alert & oriented x 3 with fluent speech, no focal motor/sensory deficits. Crit leg raising is positive on the left.    LABORATORY DATA: Admission on 11/15/2012, Discharged on 11/15/2012  Component Date Value Range Status  . WBC 11/15/2012 7.6  4.0 - 10.5 K/uL Final  . RBC 11/15/2012 4.36  3.87 - 5.11 MIL/uL Final  . Hemoglobin 11/15/2012 12.3  12.0 - 15.0 g/dL Final  . HCT 21/30/8657 37.8  36.0 - 46.0 % Final  . MCV 11/15/2012 86.7  78.0 - 100.0 fL Final  . MCH 11/15/2012 28.2  26.0 - 34.0 pg Final  . MCHC 11/15/2012 32.5  30.0 - 36.0 g/dL Final  . RDW 84/69/6295 14.3  11.5 - 15.5 % Final  . Platelets 11/15/2012 184  150 - 400 K/uL Final  . Neutrophils Relative % 11/15/2012 49  43 - 77 %  Final  . Neutro Abs 11/15/2012 3.8  1.7 - 7.7 K/uL Final  . Lymphocytes Relative 11/15/2012 40  12 - 46 % Final  . Lymphs Abs 11/15/2012 3.0  0.7 - 4.0 K/uL Final  . Monocytes Relative 11/15/2012 9  3 - 12 % Final  . Monocytes Absolute 11/15/2012 0.7  0.1 - 1.0 K/uL Final  . Eosinophils Relative 11/15/2012 2  0 - 5 % Final  . Eosinophils Absolute 11/15/2012 0.2  0.0 - 0.7 K/uL Final  . Basophils Relative 11/15/2012 0  0 - 1 % Final  . Basophils Absolute 11/15/2012 0.0  0.0 - 0.1 K/uL Final  . Sodium 11/15/2012 136  135 - 145 mEq/L Final  . Potassium 11/15/2012 3.9  3.5 - 5.1 mEq/L Final  . Chloride 11/15/2012 99  96 - 112 mEq/L Final  . CO2 11/15/2012 26  19 - 32 mEq/L Final  . Glucose, Bld 11/15/2012 113* 70 - 99 mg/dL Final  . BUN 28/41/3244 16  6 - 23 mg/dL Final  . Creatinine, Ser 11/15/2012 1.10  0.50 - 1.10 mg/dL Final  . Calcium 01/19/7251 9.5  8.4 - 10.5 mg/dL Final  . Total Protein 11/15/2012 7.6  6.0 - 8.3 g/dL Final  . Albumin 66/44/0347 3.0* 3.5 - 5.2  g/dL Final  . AST 11/91/4782 37  0 - 37 U/L Final  . ALT 11/15/2012 20  0 - 35 U/L Final  . Alkaline Phosphatase 11/15/2012 82  39 - 117 U/L Final  . Total Bilirubin 11/15/2012 0.3  0.3 - 1.2 mg/dL Final  . GFR calc non Af Amer 11/15/2012 54* >90 mL/min Final  . GFR calc Af Amer 11/15/2012 63* >90 mL/min Final   Comment: (NOTE)                          The eGFR has been calculated using the CKD EPI equation.                          This calculation has not been validated in all clinical situations.                          eGFR's persistently <90 mL/min signify possible Chronic Kidney                          Disease.  . Color, Urine 11/15/2012 YELLOW  YELLOW Final  . APPearance 11/15/2012 CLEAR  CLEAR Final  . Specific Gravity, Urine 11/15/2012 >1.030* 1.005 - 1.030 Final  . pH 11/15/2012 6.0  5.0 - 8.0 Final  . Glucose, UA 11/15/2012 100* NEGATIVE mg/dL Final  . Hgb urine dipstick 11/15/2012 NEGATIVE  NEGATIVE Final    . Bilirubin Urine 11/15/2012 NEGATIVE  NEGATIVE Final  . Ketones, ur 11/15/2012 NEGATIVE  NEGATIVE mg/dL Final  . Protein, ur 95/62/1308 NEGATIVE  NEGATIVE mg/dL Final  . Urobilinogen, UA 11/15/2012 0.2  0.0 - 1.0 mg/dL Final  . Nitrite 65/78/4696 NEGATIVE  NEGATIVE Final  . Leukocytes, UA 11/15/2012 NEGATIVE  NEGATIVE Final   MICROSCOPIC NOT DONE ON URINES WITH NEGATIVE PROTEIN, BLOOD, LEUKOCYTES, NITRITE, OR GLUCOSE <1000 mg/dL.  . Lipase 11/15/2012 16  11 - 59 U/L Final  Office Visit on 11/06/2012  Component Date Value Range Status  . Cholesterol 11/06/2012 101  0 - 200 mg/dL Final   Comment: ATP III Classification:                                < 200        mg/dL        Desirable                               200 - 239     mg/dL        Borderline High                               >= 240        mg/dL        High                             . Triglycerides 11/06/2012 77  <150 mg/dL Final  . HDL 29/52/8413 33* >39 mg/dL Final  . Total CHOL/HDL Ratio 11/06/2012 3.1   Final  . VLDL 11/06/2012 15  0 - 40 mg/dL Final  . LDL Cholesterol 11/06/2012 53  0 - 99 mg/dL Final  Comment:                            Total Cholesterol/HDL Ratio:CHD Risk                                                 Coronary Heart Disease Risk Table                                                                 Men       Women                                   1/2 Average Risk              3.4        3.3                                       Average Risk              5.0        4.4                                    2X Average Risk              9.6        7.1                                    3X Average Risk             23.4       11.0                          Use the calculated Patient Ratio above and the CHD Risk table                           to determine the patient's CHD Risk.                          ATP III Classification (LDL):                                < 100        mg/dL         Optimal                                100 - 129     mg/dL         Near or Above Optimal  130 - 159     mg/dL         Borderline High                               160 - 189     mg/dL         High                                > 190        mg/dL         Very High                             . Hemoglobin A1C 11/06/2012 10.4* <5.7 % Final   Comment:                                                                                                 According to the ADA Clinical Practice Recommendations for 2011, when                          HbA1c is used as a screening test:                                                       >=6.5%   Diagnostic of Diabetes Mellitus                                     (if abnormal result is confirmed)                                                     5.7-6.4%   Increased risk of developing Diabetes Mellitus                                                     References:Diagnosis and Classification of Diabetes Mellitus,Diabetes                          Care,2011,34(Suppl 1):S62-S69 and Standards of Medical Care in                                  Diabetes - 2011,Diabetes Care,2011,34 (Suppl 1):S11-S61.                             Marland Kitchen  Mean Plasma Glucose 11/06/2012 252* <117 mg/dL Final  Infusion on 16/10/9602  Component Date Value Range Status  . WBC 10/19/2012 6.4  4.0 - 10.5 K/uL Final  . RBC 10/19/2012 4.36  3.87 - 5.11 MIL/uL Final  . Hemoglobin 10/19/2012 12.3  12.0 - 15.0 g/dL Final  . HCT 54/09/8117 38.1  36.0 - 46.0 % Final  . MCV 10/19/2012 87.4  78.0 - 100.0 fL Final  . MCH 10/19/2012 28.2  26.0 - 34.0 pg Final  . MCHC 10/19/2012 32.3  30.0 - 36.0 g/dL Final  . RDW 14/78/2956 14.7  11.5 - 15.5 % Final  . Platelets 10/19/2012 195  150 - 400 K/uL Final  . Neutrophils Relative % 10/19/2012 51  43 - 77 % Final  . Neutro Abs 10/19/2012 3.2  1.7 - 7.7 K/uL Final  . Lymphocytes Relative 10/19/2012 39  12 - 46 % Final  . Lymphs Abs  10/19/2012 2.5  0.7 - 4.0 K/uL Final  . Monocytes Relative 10/19/2012 7  3 - 12 % Final  . Monocytes Absolute 10/19/2012 0.4  0.1 - 1.0 K/uL Final  . Eosinophils Relative 10/19/2012 3  0 - 5 % Final  . Eosinophils Absolute 10/19/2012 0.2  0.0 - 0.7 K/uL Final  . Basophils Relative 10/19/2012 1  0 - 1 % Final  . Basophils Absolute 10/19/2012 0.0  0.0 - 0.1 K/uL Final  . Sodium 10/19/2012 139  135 - 145 mEq/L Final  . Potassium 10/19/2012 4.1  3.5 - 5.1 mEq/L Final  . Chloride 10/19/2012 105  96 - 112 mEq/L Final  . CO2 10/19/2012 26  19 - 32 mEq/L Final  . Glucose, Bld 10/19/2012 127* 70 - 99 mg/dL Final  . BUN 21/30/8657 16  6 - 23 mg/dL Final  . Creatinine, Ser 10/19/2012 1.21* 0.50 - 1.10 mg/dL Final  . Calcium 84/69/6295 9.4  8.4 - 10.5 mg/dL Final  . Total Protein 10/19/2012 7.2  6.0 - 8.3 g/dL Final  . Albumin 28/41/3244 3.0* 3.5 - 5.2 g/dL Final  . AST 01/19/7251 24  0 - 37 U/L Final  . ALT 10/19/2012 16  0 - 35 U/L Final  . Alkaline Phosphatase 10/19/2012 68  39 - 117 U/L Final  . Total Bilirubin 10/19/2012 0.3  0.3 - 1.2 mg/dL Final  . GFR calc non Af Amer 10/19/2012 48* >90 mL/min Final  . GFR calc Af Amer 10/19/2012 56* >90 mL/min Final   Comment: (NOTE)                          The eGFR has been calculated using the CKD EPI equation.                          This calculation has not been validated in all clinical situations.                          eGFR's persistently <90 mL/min signify possible Chronic Kidney                          Disease.  Marland Kitchen CA 27.29 10/19/2012 26  0 - 39 U/mL Final   Performed at [facility]    PATHOLOGY:  Urinalysis    Component Value Date/Time   COLORURINE YELLOW 11/15/2012 0749   APPEARANCEUR CLEAR 11/15/2012 0749   LABSPEC >1.030* 11/15/2012 0749  PHURINE 6.0 11/15/2012 0749   GLUCOSEU 100* 11/15/2012 0749   HGBUR NEGATIVE 11/15/2012 0749   BILIRUBINUR NEGATIVE 11/15/2012 0749   KETONESUR NEGATIVE 11/15/2012 0749    PROTEINUR NEGATIVE 11/15/2012 0749   UROBILINOGEN 0.2 11/15/2012 0749   NITRITE NEGATIVE 11/15/2012 0749   LEUKOCYTESUR NEGATIVE 11/15/2012 0749    RADIOGRAPHIC STUDIES: Ct Abdomen Pelvis Wo Contrast  11/15/2012   CLINICAL DATA:  Right-sided flank pain. History of right breast cancer. Ongoing chemotherapy. Bone metastases.  EXAM: CT ABDOMEN AND PELVIS WITHOUT CONTRAST  TECHNIQUE: Multidetector CT imaging of the abdomen and pelvis was performed following the standard protocol without intravenous contrast.  COMPARISON:  CT 07/22/2009  FINDINGS: Small hiatal hernia. Heart is mildly enlarged. No confluent airspace opacities or effusions in the lung bases.  Liver, gallbladder, spleen, pancreas, adrenals and kidneys have an unremarkable unenhanced appearance. No renal or ureteral stones. No hydronephrosis. Appendix is visualized and is normal. Urinary bladder is decompressed. No free fluid, free air or adenopathy. There are scattered right colonic diverticula. No active diverticulitis. Small bowel is decompressed. Aorta is normal caliber.  Prior hysterectomy. No adnexal masses.  Marked interval worsening of diffuse skeletal sclerotic metastases throughout the visualized lower ribs, lower thoracic spine, lumbar spine and bony pelvis.  IMPRESSION: No renal or ureteral stones. No hydronephrosis.  Marked worsening of diffuse skeletal metastases.   Electronically Signed   By: Charlett Nose M.D.   On: 11/15/2012 08:28    ASSESSMENT:  #1. Rule out compression fracture in the lumbar spine with a previous history of L4-L5 foramen narrowed. #2. Stage IV breast cancer with bone metastases, increasing sclerosis perhaps indicative of a positive response instead of progression. #3. Vasomotor instability. #4. Morbid obesity. #5. Diabetes mellitus, insulin requiring, controlled.  PLAN:  #1. MRI of the lumbosacral spine at GI to determine whether or not a compression fracture is present. #2. Repeat nuclear medicine  bone scan to look for new areas of involvement. #3. Effects are asked our 37.5 mg to 75 mg nightly to help with hot flashes. #4. Oxycodone 10 mg tablets to take 1 or 2 every 4 hours to control pain. #5. Followup in one week.    All questions were answered. The patient knows to call the clinic with any problems, questions or concerns. We can certainly see the patient much sooner if necessary.   I spent 25 minutes counseling the patient face to face. The total time spent in the appointment was 30 minutes.    Maurilio Lovely, MD 11/15/2012 10:22 AM

## 2012-11-15 NOTE — ED Notes (Addendum)
Patient reports pain beginning in R lumbar region radiating to R hip, pelvis and down R thigh laterally.  States she has had this kind of pain before but not this severe.  States she was told it was arthritis. Denies dysuria, dribbling urine, burning or stinging on urination, denies any constipation or diarrhea.  Vomited x 1, but no current nausea. Was unable to sleep last night d/t discomfort.  Nothing has relieved pain including 1 Percocet she has for bone cancer pain.

## 2012-11-15 NOTE — ED Provider Notes (Signed)
CSN: [id number]     Arrival date & time 11/15/12  0720 History  This chart was scribed for Kaylee Roller, MD by Blanchard Kelch, ED Scribe. The patient was seen in room APA03/APA03. Patient's care was started at 7:26 AM.    Chief Complaint  Patient presents with  . Abdominal Pain  . Back Pain  . Hip Pain  . Leg Pain    The history is provided by the patient. No language interpreter was used.    HPI Comments: [name] is a 57 y.o. female who presents to the Emergency Department complaining of intermittent, worsening abdominal pain that began two or three days ago. She points to her "right side and lower quadrant" when asked where the pain was located. She describes the pain as sharp and stabbing and is a 9/10 in severity. She was unable to sleep last night due to the pain. The pain is associated with nausea, dark urine and one episode of vomiting last night. She has been taking prescribed pain medication for the pain without relief. She denies fever, chills, diarrhea, or dysuria. She has never been pregnant. She has a past medical history of breast cancer that has spread to her bones. She is still receiving chemotherapy for it.   Past Medical History  Diagnosis Date  . Hypertension   . High cholesterol   . Knee pain, bilateral 2013  . Cancer     rt breast is primary  . Bone metastasis 09/13/2010  . Diabetes mellitus     type II  . Depression   . Depression 12/14/2011   Past Surgical History  Procedure Laterality Date  . Knee surgery      right-arthroscopy  . Mastectomy      right side  . Abdominal hysterectomy     Family History  Problem Relation Age of Onset  . Arthritis    . Cancer    . Diabetes    . Anesthesia problems Neg Hx   . Hypotension Neg Hx   . Malignant hyperthermia Neg Hx   . Pseudochol deficiency Neg Hx   . Diabetes Mother   . Diabetes Father   . Cancer Maternal Aunt    History  Substance Use Topics  . Smoking status: Never Smoker   . Smokeless  tobacco: Never Used  . Alcohol Use: No   OB History   Grav Para Term Preterm Abortions TAB SAB Ect Mult Living                 Review of Systems  Constitutional: Negative for fever and chills.  Gastrointestinal: Positive for nausea and vomiting. Negative for diarrhea.  Genitourinary: Negative for dysuria.  All other systems reviewed and are negative.    Allergies  Review of patient's allergies indicates no known allergies.  Home Medications   Current Outpatient Rx  Name  Route  Sig  Dispense  Refill  . aspirin EC 81 MG tablet   Oral   Take 81 mg by mouth daily.           Marland Kitchen atorvastatin (LIPITOR) 20 MG tablet   Oral   Take 1 tablet (20 mg total) by mouth daily.   90 tablet   1   . benzonatate (TESSALON) 100 MG capsule   Oral   Take 1 capsule (100 mg total) by mouth 2 (two) times daily as needed for cough.   20 capsule   0   . calcium-vitamin D (OSCAL WITH D) 500-200 MG-UNIT  per tablet   Oral   Take 1 tablet by mouth daily.           . carvedilol (COREG) 12.5 MG tablet   Oral   Take 1 tablet (12.5 mg total) by mouth 2 (two) times daily with a meal.   180 tablet   1     Dose Change   . Insulin Glargine (LANTUS SOLOSTAR) 100 UNIT/ML SOPN   Subcutaneous   Inject 50 Units into the skin at bedtime.   3 mL   2   . insulin lispro (HUMALOG) 100 UNIT/ML injection   Subcutaneous   Inject 10-15 Units into the skin 3 (three) times daily before meals. Inject 10 to 15 units before each meal         . lisinopril-hydrochlorothiazide (PRINZIDE,ZESTORETIC) 10-12.5 MG per tablet   Oral   Take 1 tablet by mouth daily. For blood pressure   90 tablet   1   . oxyCODONE-acetaminophen (PERCOCET) 7.5-325 MG per tablet   Oral   Take 1 tablet by mouth every 4 (four) hours as needed for pain.   90 tablet   0   . sertraline (ZOLOFT) 100 MG tablet   Oral   Take 1 tablet (100 mg total) by mouth daily. For depression and anxiety   90 tablet   1   . tamoxifen  (NOLVADEX) 20 MG tablet   Oral   Take 1 tablet (20 mg total) by mouth daily.   90 tablet   2   . naproxen (NAPROSYN) 500 MG tablet   Oral   Take 1 tablet (500 mg total) by mouth 2 (two) times daily with a meal.   30 tablet   0    Triage Vitals: BP 135/81  Pulse 104  Temp(Src) 97.5 F (36.4 C) (Oral)  Ht 5\' 5"  (1.651 m)  Wt 306 lb (138.801 kg)  BMI 50.92 kg/m2  SpO2 94%  Physical Exam  Nursing note and vitals reviewed. Constitutional: She is oriented to person, place, and time. She appears well-developed and well-nourished.  HENT:  Head: Normocephalic and atraumatic.  Poor dentition  Eyes: Pupils are equal, round, and reactive to light.  Neck: Neck supple.  Cardiovascular: Normal rate, regular rhythm and normal heart sounds.   Pulmonary/Chest: Effort normal. No respiratory distress. She has no wheezes.  Abdominal: Soft. Bowel sounds are normal. There is tenderness (bilateral lower abd ttp - no guarding or masses, no CVA ttp). There is no CVA tenderness.  Neurological: She is alert and oriented to person, place, and time.  Skin: Skin is warm and dry.  Psychiatric: She has a normal mood and affect.    ED Course  Procedures (including critical care time)  DIAGNOSTIC STUDIES: Oxygen Saturation is 94% on room air, adequate by my interpretation.    COORDINATION OF CARE: 9:06 AM - Patient verbalizes understanding and agrees with treatment plan.    Labs Review Labs Reviewed  COMPREHENSIVE METABOLIC PANEL - Abnormal; Notable for the following:    Glucose, Bld 113 (*)    Albumin 3.0 (*)    GFR calc non Af Amer 54 (*)    GFR calc Af Amer 63 (*)    All other components within normal limits  URINALYSIS, ROUTINE W REFLEX MICROSCOPIC - Abnormal; Notable for the following:    Specific Gravity, Urine >1.030 (*)    Glucose, UA 100 (*)    All other components within normal limits  CBC WITH DIFFERENTIAL  LIPASE, BLOOD   Imaging  Review Ct Abdomen Pelvis Wo  Contrast  11/15/2012   CLINICAL DATA:  Right-sided flank pain. History of right breast cancer. Ongoing chemotherapy. Bone metastases.  EXAM: CT ABDOMEN AND PELVIS WITHOUT CONTRAST  TECHNIQUE: Multidetector CT imaging of the abdomen and pelvis was performed following the standard protocol without intravenous contrast.  COMPARISON:  CT 07/22/2009  FINDINGS: Small hiatal hernia. Heart is mildly enlarged. No confluent airspace opacities or effusions in the lung bases.  Liver, gallbladder, spleen, pancreas, adrenals and kidneys have an unremarkable unenhanced appearance. No renal or ureteral stones. No hydronephrosis. Appendix is visualized and is normal. Urinary bladder is decompressed. No free fluid, free air or adenopathy. There are scattered right colonic diverticula. No active diverticulitis. Small bowel is decompressed. Aorta is normal caliber.  Prior hysterectomy. No adnexal masses.  Marked interval worsening of diffuse skeletal sclerotic metastases throughout the visualized lower ribs, lower thoracic spine, lumbar spine and bony pelvis.  IMPRESSION: No renal or ureteral stones. No hydronephrosis.  Marked worsening of diffuse skeletal metastases.   Electronically Signed   By: Charlett Nose M.D.   On: 11/15/2012 08:28    EKG Interpretation   None       MDM   1. Abdominal  pain, other specified site    Pt appears overall well, no fevers, no focal ttp adn normal CT without abscesses or acute surgical processes - she does have worsening metastatic disease - she was informed of this -   Meds given in ED:  Medications  ondansetron (ZOFRAN) injection 4 mg (4 mg Intravenous Given 11/15/12 0753)  ketorolac (TORADOL) 30 MG/ML injection 30 mg (30 mg Intravenous Given 11/15/12 0753)    New Prescriptions   NAPROXEN (NAPROSYN) 500 MG TABLET    Take 1 tablet (500 mg total) by mouth 2 (two) times daily with a meal.      I personally performed the services described in this documentation, which was  scribed in my presence. The recorded information has been reviewed and is accurate.      Kaylee Roller, MD 11/15/12 [phone]

## 2012-11-16 ENCOUNTER — Telehealth (HOSPITAL_COMMUNITY): Payer: Self-pay | Admitting: *Deleted

## 2012-11-16 ENCOUNTER — Telehealth: Payer: Self-pay | Admitting: Family Medicine

## 2012-11-16 ENCOUNTER — Other Ambulatory Visit (HOSPITAL_COMMUNITY): Payer: Self-pay | Admitting: Oncology

## 2012-11-16 DIAGNOSIS — C50919 Malignant neoplasm of unspecified site of unspecified female breast: Secondary | ICD-10-CM

## 2012-11-16 DIAGNOSIS — C7951 Secondary malignant neoplasm of bone: Secondary | ICD-10-CM

## 2012-11-16 MED ORDER — SERTRALINE HCL 100 MG PO TABS: 100.0000 mg | ORAL_TABLET | Freq: Every day | ORAL | Status: AC

## 2012-11-16 MED ORDER — TAMOXIFEN CITRATE 20 MG PO TABS: 20.0000 mg | ORAL_TABLET | Freq: Every day | ORAL | Status: DC

## 2012-11-16 NOTE — Telephone Encounter (Signed)
Patient needs her Zoloft refilled.    Kaylee Mitchell -[employer]

## 2012-11-19 ENCOUNTER — Encounter (HOSPITAL_COMMUNITY)
Admission: RE | Admit: 2012-11-19 | Discharge: 2012-11-19 | Disposition: A | Discharge status: Home / Self Care | Payer: Medicare Other | Source: Ambulatory Visit | Attending: Hematology and Oncology | Admitting: Hematology and Oncology

## 2012-11-19 ENCOUNTER — Ambulatory Visit (HOSPITAL_COMMUNITY)
Admission: RE | Admit: 2012-11-19 | Discharge: 2012-11-19 | Disposition: A | Discharge status: Home / Self Care | Payer: Medicare Other | Source: Ambulatory Visit | Attending: Oncology | Admitting: Oncology

## 2012-11-19 ENCOUNTER — Encounter (HOSPITAL_COMMUNITY): Payer: Self-pay

## 2012-11-19 DIAGNOSIS — C7951 Secondary malignant neoplasm of bone: Secondary | ICD-10-CM

## 2012-11-19 DIAGNOSIS — Z1231 Encounter for screening mammogram for malignant neoplasm of breast: Secondary | ICD-10-CM | POA: Diagnosis not present

## 2012-11-19 DIAGNOSIS — Z139 Encounter for screening, unspecified: Secondary | ICD-10-CM

## 2012-11-19 DIAGNOSIS — C50911 Malignant neoplasm of unspecified site of right female breast: Secondary | ICD-10-CM

## 2012-11-19 DIAGNOSIS — C50919 Malignant neoplasm of unspecified site of unspecified female breast: Secondary | ICD-10-CM | POA: Diagnosis not present

## 2012-11-19 MED ORDER — TECHNETIUM TC 99M MEDRONATE IV KIT
25.0000 | PACK | Freq: Once | INTRAVENOUS | Status: AC | PRN
  Administered 2012-11-19: 25 via INTRAVENOUS

## 2012-11-22 ENCOUNTER — Ambulatory Visit (HOSPITAL_COMMUNITY): Payer: Medicare Other

## 2012-11-28 ENCOUNTER — Ambulatory Visit
Admission: RE | Admit: 2012-11-28 | Discharge: 2012-11-28 | Disposition: A | Discharge status: Home / Self Care | Payer: Medicare Other | Source: Ambulatory Visit | Attending: Hematology and Oncology | Admitting: Hematology and Oncology

## 2012-11-28 DIAGNOSIS — C50919 Malignant neoplasm of unspecified site of unspecified female breast: Secondary | ICD-10-CM | POA: Diagnosis not present

## 2012-11-28 DIAGNOSIS — C7951 Secondary malignant neoplasm of bone: Secondary | ICD-10-CM

## 2012-11-28 DIAGNOSIS — C50911 Malignant neoplasm of unspecified site of right female breast: Secondary | ICD-10-CM

## 2012-11-28 MED ORDER — GADOBENATE DIMEGLUMINE 529 MG/ML IV SOLN
20.0000 mL | Freq: Once | INTRAVENOUS | Status: AC | PRN
  Administered 2012-11-28: 20 mL via INTRAVENOUS

## 2012-12-04 NOTE — Progress Notes (Signed)
Rescheduled

## 2012-12-05 ENCOUNTER — Ambulatory Visit (HOSPITAL_COMMUNITY): Payer: Medicare Other | Admitting: Oncology

## 2012-12-05 ENCOUNTER — Encounter (HOSPITAL_COMMUNITY): Payer: Self-pay | Admitting: Oncology

## 2012-12-05 NOTE — Progress Notes (Signed)
Kaylee Antis, MD [address] [place] [address]  Breast CA, unspecified laterality  Bone metastasis  CURRENT THERAPY:On Tamoxifen daily and Xgeva every 4 weeks   INTERVAL HISTORY: Kaylee Mitchell 57 y.o. 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 She presented with a 7 cm tumor, extensive LVI. Tumor focally extended to the anterior surface margin, 8 of 8 lymph nodes were involved with metastatic disease. She was ER positive 100%, PR positive 99%, HER2 neu negative, Ki-67 marker 14%. No liver involvement, no lung involvement and she had a mastectomy followed by radiation therapy to T4 through T7 due to severe involvement at this area. She was treated with Arimidex initially from 07/2009 through 03/23/2010 and was progressing by bone scan criteria. We switched her to tamoxifen which she is tolerating well and her bone scan is now stable. She has not received radiation therapy to the chest wall or axilla.   She was seen by Dr. Zigmund Mitchell on 11/15/2012.  At that time, she complained of back pain.  She therefore underwent an MRI of lumbar spine in [place], due to her size and unable to have scan here at [facility].  The full report follows, but the gist of the report demonstrates Progressive diffuse lumbar spine metastatic disease with the most progression at the L1 vertebra. The vertebral body marrow space is almost completely replaced with enhancing metastasis. No pathologic compression fracture.  However, her most recent bone scan on 11/19/2012 demonstrated no evidence of skeletal metastatic disease progression.   I have discussed the case with Dr. Tyron Mitchell and he aggress with Dr. Carlota Mitchell that this patient does demonstrate progression of osseous metastatic disease.  Bone scan may not always pick-up bone marrow involvement.   NCCN guidelines recommends the following for monitoring of metastatic  breast cancer:  A. Components of monitoring:   1. Monitoring includes periodic assessment of varied combinations of symptoms, physical examination, routine laboratory tests, imaging studies, and blood biomarkers where appropriate. Results of monitoring are classified as response/continued response to treatment, stable disease, uncertainty regarding disease status, or progression of disease. The clinician typically must assess and balance multiple different forms of information to make a determination regarding whether disease is being controlled and the toxicity of treatment is acceptable. Sometimes, this information may be contradictory.  B. Definition of disease progression:   1. Unequivocal evidence of progression of disease by one or more of these factors is required to establish progression of disease, either because of ineffective therapy or acquired resistance of disease to an applied therapy.  Progression of disease may be identified through evidence of growth or worsening of disease at previously known sites of disease and/or of the occurrence of new sites of metastatic disease.   2. Findings concerning for progression of disease include:    A. Worsening symptoms such as pain or dyspnea    B. Evidence of worsening or new disease on physical examination.    C. Declining performance status    D. Unexplained weight loss    E. Increasing Alkaline phosphatase, ALT, AST, or bilirubin    F. Hypercalcemia    G. New radiographic abnormality or increase in the size of pre-existing radiographic abnormality.    H. New areas of abnormality on functional imaging (eg, bone scan, PET/CT scan)    I. Increasing tumor markers (eg, CEA, CA 15-3, CA27.29)   With this information, it  is time to switch therapy to Fulvestrant + Exemestane.  We discussed the risks, benefits, alternatives, and side effects of this therapy change.  I will get our nurse navigator to perform chemo-teaching for this change in therapy.    I personally reviewed and went over laboratory results with the patient.  Oncologically, she denies any complaints and ROS questioning is negative.  Past Medical History  Diagnosis Date  . Hypertension   . High cholesterol   . Knee pain, bilateral 2013  . Cancer     rt breast is primary  . Bone metastasis 09/13/2010  . Diabetes mellitus     type II  . Depression   . Depression 12/14/2011  . 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     has No Known Allergies.  Kaylee Mitchell does not currently have medications on file.  Past Surgical History  Procedure Laterality Date  . Knee surgery      right-arthroscopy  . Mastectomy      right side  . Abdominal hysterectomy      Denies any headaches, dizziness, double vision, fevers, chills, night sweats, nausea, vomiting, diarrhea, constipation, chest pain, heart palpitations, shortness of breath, blood in stool, black tarry stool, urinary pain, urinary burning, urinary frequency, hematuria.   PHYSICAL EXAMINATION  ECOG PERFORMANCE STATUS: 1 - Symptomatic but completely ambulatory  Filed Vitals:   12/06/12 0900  BP: 136/81  Pulse: 101  Temp: 98 F (36.7 C)  Resp: 18    GENERAL:alert, no distress, well nourished, well developed, comfortable, cooperative and smiling SKIN: skin color, texture, turgor are normal, no rashes or significant lesions HEAD: Normocephalic, No masses, lesions, tenderness or abnormalities EYES: normal, PERRLA, EOMI, Conjunctiva are pink and non-injected EARS: External ears normal OROPHARYNX:mucous membranes are moist  NECK:  supple, no adenopathy, trachea midline LYMPH:  no palpable lymphadenopathy, no hepatosplenomegaly BREAST:not examined LUNGS: clear to auscultation and percussion HEART: regular rate & rhythm, no murmurs, no gallops, S1 normal and S2 normal ABDOMEN:abdomen soft, non-tender, obese and normal bowel sounds BACK: Back symmetric, no curvature. EXTREMITIES:less then 2 second capillary refill, no joint deformities, effusion, or inflammation, no skin discoloration, no clubbing, no cyanosis  NEURO: alert & oriented x 3 with fluent speech, no focal motor/sensory deficits, gait normal    LABORATORY DATA: CBC    Component Value Date/Time   WBC 7.6 11/15/2012 0746   RBC 4.36 11/15/2012 0746   HGB 12.3 11/15/2012 0746   HCT 37.8 11/15/2012 0746   PLT 184 11/15/2012 0746   MCV 86.7 11/15/2012 0746   MCH 28.2 11/15/2012 0746   MCHC 32.5 11/15/2012 0746   RDW 14.3 11/15/2012 0746   LYMPHSABS 3.0 11/15/2012 0746   MONOABS 0.7 11/15/2012 0746   EOSABS 0.2 11/15/2012 0746   BASOSABS 0.0 11/15/2012 0746      Chemistry      Component Value Date/Time   NA 136 11/15/2012 0746   K 3.9 11/15/2012 0746   CL 99 11/15/2012 0746   CO2 26 11/15/2012 0746   BUN 16 11/15/2012 0746   CREATININE 1.10 11/15/2012 0746      Component Value Date/Time   CALCIUM 9.5 11/15/2012 0746   ALKPHOS 82 11/15/2012  0746   AST 37 11/15/2012 0746   ALT 20 11/15/2012 0746   BILITOT 0.3 11/15/2012 0746      Lab Results  Component Value Date   LABCA2 26 10/19/2012      RADIOGRAPHIC STUDIES:  11/29/2012  CLINICAL DATA: Low back pain and right flank pain radiating to the  right foot. Numbness for 6 months. History of breast cancer.  EXAM:  MRI LUMBAR SPINE WITHOUT AND WITH CONTRAST  TECHNIQUE:  Multiplanar and multiecho pulse sequences of the lumbar spine were  obtained without and with intravenous contrast.  CONTRAST: 20mL MULTIHANCE GADOBENATE DIMEGLUMINE 529 MG/ML IV SOLN  COMPARISON: CT 11/15/2012. Bone  scan 11/19/2012. MRI of lumbar  spine 06/26/2012.  FINDINGS:  Numbering used on prior exam preserved. Spinal cord terminates  posterior to the L1-L2 interspace. Vertebral body height is  preserved. There are no pathologic compression fractures. Diffuse  metastatic infiltration of the lumbar spine is present. The most  avid post gadolinium enhancement is in the L1 vertebra. Metastatic  infiltration of the sacrum and both iliac bones is present. No  definite insufficiency fracture identified. Paraspinal soft tissues  are within normal limits and unchanged. There is a mild S-shaped  curvature of the lumbar spine. Intervertebral disc spaces from  T11-T12 through L2-L3 appear normal aside from mild disc  desiccation. No stenosis.  L4-L5: Disc desiccation and degeneration with shallow bulging. There  is mild bilateral foraminal stenosis associated with bulging disc.  Central canal and lateral recesses are patent. Facet joints appear  normal.  L5-S1: Preserved disc height. Central canal and lateral recesses  patent. Foramina patent.  When comparing of the pre and post-contrast imaging to the prior  examination of 06/26/2012, the infiltration of the L1 vertebra  appears more pronounced and now occupies almost the entire vertebral  body marrow space. There is no extraosseous extension of tumor.  Additionally, there is increasing prominence of metastatic disease  in several vertebral bodies. This is particularly true at L5 where a  new metastatic lesion can be seen in the right pedicle. The  sclerotic metastatic lesions appear little changed. There is also  new T11 involvement. Iliac bone and sacral lesions grossly appear  unchanged.  IMPRESSION:  1. Progressive diffuse lumbar spine metastatic disease with the most  progression at the L1 vertebra. The vertebral body marrow space is  almost completely replaced with enhancing metastasis. No pathologic  compression fracture.  2. No interval  change in mild L4-L5 spondylosis.  Electronically Signed  By: Andreas Newport M.D.  On: 11/28/2012 13:43   11/19/2012  CLINICAL DATA: Status of bone metastases, ca breast  EXAM:  NUCLEAR MEDICINE WHOLE BODY BONE SCAN  TECHNIQUE:  Whole body anterior and posterior images were obtained approximately  3 hours after intravenous injection of radiopharmaceutical.  COMPARISON: NM BONE WHOLE BODY dated 09/07/2012; NM BONE WHOLE BODY  dated 03/05/2012; CT ABD/PELV WO CM dated 11/15/2012  RADIOPHARMACEUTICALS: 25 mCi Technetium-99 MDP  FINDINGS:  Mild uptake within the sacrum on the posterior images is the similar  to comparison bone scan. Mild uptake within the L2 vertebral body is  also noted. Mild uptake within the ribs suggest metastasis.  There is no evidence of new uptake within the axial or appendicular  skeleton. The degree of uptake on the whole-body bone scan is less  than the amount of skeletal sclerotic metastatic burden demonstrated  on comparison CT.  IMPRESSION:  No evidence of skeletal metastatic disease progression.  Electronically Signed  By: Roseanne Reno  Amil Amen M.D.  On: 11/19/2012 14:35     ASSESSMENT:  1. 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 She presented with a 7 cm tumor, extensive LVI. Tumor focally extended to the anterior surface margin, 8 of 8 lymph nodes were involved with metastatic disease. She was ER positive 100%, PR positive 99%, HER2 neu negative, Ki-67 marker 14%. No liver involvement, no lung involvement and she had a mastectomy followed by radiation therapy to T4 through T7 due to severe involvement at this area. She was treated with Arimidex initially from 07/2009 through 03/23/2010 and was progressing by bone scan criteria. We switched her to tamoxifen which she is tolerating well and her bone scan is now stable. She has not received radiation therapy to the chest wall or axilla. Recent MRI of lumbar spine  completed on 11/29/2012 demonstrates progressive diffuse lumbar spine metastatic disease.    Invasive ductal carcinoma of breast   05/26/2009 Initial Diagnosis Needle core biopsy showed invasive mammary carcinoma with 1 + lymph node   06/03/2009 Surgery Right modified radical mastectomy with 8/8 + lymph nodes by Dr. Wende Neighbors   07/22/2009 Cancer Staging Bone scan + for multiple osseous lesions.  No disease elsewhere.   07/24/2009 - 03/23/2010 Chemotherapy Arimidex   03/23/2010 Progression    03/23/2010 - 12/05/2012 Chemotherapy Tamoxifen   11/28/2012 Progression Worsening of lumbar spine osseous disease   12/06/2012 -  Chemotherapy Fulvestrant + Exemestane    2. 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  3. Obesity.  4. Diabetes mellitus.  5. History of depression on therapy.  6. Hypercholesterolemia.  7. Poor dental hygiene with multiple missing teeth  8. Nausea following medication consumption  9. Hypoglycemic events in AM  10. Poor venous access  11. Tinea versicolor, resolved after treatment with Diflucan  Patient Active Problem List   Diagnosis Date Noted  . URI, acute 11/06/2012  . Lumbar back pain 08/07/2012  . Tinea versicolor 02/11/2012  . Diabetes mellitus with chronic kidney disease 01/12/2012  . CKD (chronic kidney disease) 01/12/2012  . Hyperlipidemia 01/12/2012  . Essential hypertension, benign 01/12/2012  . Morbid obesity 01/12/2012  . Depression 12/14/2011  . Difficulty in walking 03/25/2011  . S/P arthroscopy of left knee 03/21/2011  . Medial meniscus, posterior horn derangement 03/09/2011  . Invasive ductal carcinoma of breast 09/13/2010  . Bone metastasis 09/13/2010     PLAN:  1. I personally reviewed and went over laboratory results with the patient. 2. I personally reviewed and went over radiographic studies with the patient. 3. Continue Xgeva every 4 weeks for bone mets (last given on 11/15/2012) 4. Labs  every 4 weeks: CBC diff, CMET, CA 27.29 5. D/C Tamoxifen.  Will start exemestane 6. Fulvestrant loading dose therapy plan built 7. Fulvestrant maintenance therapy plan built 8. Patient education regarding Fulvestrant.  Risks, benefits, alternatives, and side effects of therapy discussed.  9. Chemotherapy teaching 10. I reviewed NCCN guidelines with the patient for definition of progression of disease.  11. Return in 4 weeks for follow-up to evaluate tolerance of therapy.    THERAPY PLAN:  Due to the patient's recent progression on MRI of lumbar spine, we will switch her to Fulvestrant therapy.  We will restage her with MRIs in 3 months, sooner if clinically indicated.   All questions were answered. The patient knows to call the clinic with any problems, questions or concerns. We can certainly see the  patient much sooner if necessary.  Patient and plan discussed with Dr. Alla German and he is in agreement with the aforementioned.   KEFALAS,THOMAS

## 2012-12-06 ENCOUNTER — Encounter (HOSPITAL_COMMUNITY): Payer: Self-pay | Admitting: Oncology

## 2012-12-06 ENCOUNTER — Encounter (HOSPITAL_COMMUNITY): Payer: Medicare Other | Attending: Oncology | Admitting: Oncology

## 2012-12-06 VITALS — BP 136/81 | HR 101 | Temp 98.0°F | Resp 18 | Wt 300.8 lb

## 2012-12-06 DIAGNOSIS — C50919 Malignant neoplasm of unspecified site of unspecified female breast: Secondary | ICD-10-CM | POA: Insufficient documentation

## 2012-12-06 DIAGNOSIS — C7951 Secondary malignant neoplasm of bone: Secondary | ICD-10-CM | POA: Insufficient documentation

## 2012-12-06 DIAGNOSIS — C50219 Malignant neoplasm of upper-inner quadrant of unspecified female breast: Secondary | ICD-10-CM | POA: Diagnosis not present

## 2012-12-06 DIAGNOSIS — C801 Malignant (primary) neoplasm, unspecified: Secondary | ICD-10-CM | POA: Insufficient documentation

## 2012-12-06 MED ORDER — EXEMESTANE 25 MG PO TABS: 25.0000 mg | ORAL_TABLET | Freq: Every day | ORAL | Status: DC

## 2012-12-06 NOTE — Patient Instructions (Signed)
[facility]   CHEMOTHERAPY INSTRUCTIONS  FASLODEX  There are many reasons why doctors may choose FASLODEX. Understanding how estrogen affects breast cancer and how FASLODEX may work can help you evaluate FASLODEX and help you understand your doctor's decision.  Estrogen plays an important role  Hormone receptor-positive metastatic breast cancer often relies on estrogen to grow and progress. When the supply of estrogen is reduced or blocked, breast cancer cells may not multiply or grow as quickly. Here's a simple way to think about how FASLODEX interacts with hormone receptors to limit cancer cell growth.  FASLODEX targets the estrogen receptor  FASLODEX binds to estrogen receptors and blocks the effect estrogen has on the cancer cells FASLODEX causes the estrogen receptor to change shape and not work as well FASLODEX decreases the number of estrogen receptors, so there are fewer receptors available for the estrogen to act on.  Most common side effects  In a clinical trial, commonly reported side effects seen with FASLODEX were injection site pain, nausea, muscle, joint, and bone pain, headache, tiredness, hot flashes, vomiting, loss of appetite, weakness, cough, constipation, shortness of breath, and increased liver enzymes      SYMPTOMS TO REPORT AS SOON AS POSSIBLE AFTER TREATMENT:  FEVER GREATER THAN 100.5 F  CHILLS WITH OR WITHOUT FEVER  NAUSEA AND VOMITING THAT IS NOT CONTROLLED WITH YOUR NAUSEA MEDICATION  UNUSUAL SHORTNESS OF BREATH  UNUSUAL BRUISING OR BLEEDING  TENDERNESS IN MOUTH AND THROAT WITH OR WITHOUT PRESENCE OF ULCERS  URINARY PROBLEMS  BOWEL PROBLEMS  UNUSUAL RASH    Wear comfortable clothing and clothing appropriate for easy access to any Portacath or PICC line. Let us know if there is anything that we can do to make your therapy better!      I have been informed and understand all of the instructions given to  me and have received a copy. I have been instructed to call the clinic [phone] or my family physician as soon as possible for continued medical care, if indicated. I do not have any more questions at this time but understand that I may call the Cancer Center or the Patient Navigator at [phone] during office hours should I have questions or need assistance in obtaining follow-up care.      _________________________________________      _______________     __________ Signature of Patient or Authorized Representative        Date                            Time      _________________________________________ Nurse's Signature      Fulvestrant injection What is this medicine? FULVESTRANT (ful VES trant) blocks the effects of estrogen. It is used to treat breast cancer in women past the age of menopause. This medicine may be used for other purposes; ask your health care provider or pharmacist if you have questions. COMMON BRAND NAME(S): FASLODEX What should I tell my health care provider before I take this medicine? They need to know if you have any of these conditions: -bleeding problems -liver disease -low levels of platelets in the blood -an unusual or allergic reaction to fulvestrant, other medicines, foods, dyes, or preservatives -pregnant or trying to get pregnant -breast-feeding How should I use this medicine? This medicine is for injection into a muscle. It is usually given by a health care professional in a hospital or clinic  setting. Talk to your pediatrician regarding the use of this medicine in children. Special care may be needed. Overdosage: If you think you have taken too much of this medicine contact a poison control center or emergency room at once. NOTE: This medicine is only for you. Do not share this medicine with others. What if I miss a dose? It is important not to miss your dose. Call your doctor or health care professional if you are unable to  keep an appointment. What may interact with this medicine? -medicines that treat or prevent blood clots like warfarin, enoxaparin, and dalteparin This list may not describe all possible interactions. Give your health care provider a list of all the medicines, herbs, non-prescription drugs, or dietary supplements you use. Also tell them if you smoke, drink alcohol, or use illegal drugs. Some items may interact with your medicine. What should I watch for while using this medicine? Your condition will be monitored carefully while you are receiving this medicine. You will need important blood work done while you are taking this medicine. Do not become pregnant while taking this medicine. Women should inform their doctor if they wish to become pregnant or think they might be pregnant. There is a potential for serious side effects to an unborn child. Talk to your health care professional or pharmacist for more information. What side effects may I notice from receiving this medicine? Side effects that you should report to your doctor or health care professional as soon as possible: -allergic reactions like skin rash, itching or hives, swelling of the face, lips, or tongue -feeling faint or lightheaded, falls -fever or flu-like symptoms -sore throat -vaginal bleeding Side effects that usually do not require medical attention (report to your doctor or health care professional if they continue or are bothersome): -aches, pains -constipation or diarrhea -headache -hot flashes -nausea, vomiting -pain at site where injected -stomach pain This list may not describe all possible side effects. Call your doctor for medical advice about side effects. You may report side effects to FDA at 1-800-FDA-1088. Where should I keep my medicine? This drug is given in a hospital or clinic and will not be stored at home. NOTE: This sheet is a summary. It may not cover all possible information. If you have questions about  this medicine, talk to your doctor, pharmacist, or health care provider.  2014, Elsevier/Gold Standard. (2007-05-14 15:39:24)

## 2012-12-06 NOTE — Patient Instructions (Addendum)
[facility] Discharge Instructions  RECOMMENDATIONS MADE BY THE CONSULTANT AND ANY TEST RESULTS WILL BE SENT TO YOUR REFERRING PHYSICIAN.  EXAM FINDINGS BY THE PHYSICIAN TODAY AND SIGNS OR SYMPTOMS TO REPORT TO CLINIC OR PRIMARY PHYSICIAN: Exam and findings as discussed by Dellis Anes, PA-C. Your disease had progressed and we need to switch your therapy.  Will start you on Faslodex injections every 4 weeks and Exemestane (Aromasin) 25 mg daily.Report increased pain, shortness of breath, etc.  Rosana Berger will be in touch with you to discuss the Faslodex.  MEDICATIONS PRESCRIBED:  Stop the Tamoxifen (nolvadex) Start Exemestane (Aromasin) 25mg   INSTRUCTIONS/FOLLOW-UP: Faslodex every 2 weeks  Follow-up in 4 weeks to see PA.  Thank you for choosing [facility] to provide your oncology and hematology care.  To afford each patient quality time with our providers, please arrive at least 15 minutes before your scheduled appointment time.  With your help, our goal is to use those 15 minutes to complete the necessary work-up to ensure our physicians have the information they need to help with your evaluation and healthcare recommendations.    Effective January 1st, 2014, we ask that you re-schedule your appointment with our physicians should you arrive 10 or more minutes late for your appointment.  We strive to give you quality time with our providers, and arriving late affects you and other patients whose appointments are after yours.    Again, thank you for choosing [facility].  Our hope is that these requests will decrease the amount of time that you wait before being seen by our physicians.       _____________________________________________________________  Should you have questions after your visit to [facility], please contact our office at [phone] between the hours of 8:30 a.m. and 5:00 p.m.  Voicemails left after 4:30 p.m.  will not be returned until the following business day.  For prescription refill requests, have your pharmacy contact our office with your prescription refill request.

## 2012-12-11 ENCOUNTER — Encounter (HOSPITAL_BASED_OUTPATIENT_CLINIC_OR_DEPARTMENT_OTHER): Payer: Medicare Other

## 2012-12-11 ENCOUNTER — Encounter (HOSPITAL_COMMUNITY): Payer: Medicare Other

## 2012-12-11 VITALS — BP 138/88 | HR 106 | Temp 98.5°F | Resp 16

## 2012-12-11 DIAGNOSIS — C50919 Malignant neoplasm of unspecified site of unspecified female breast: Secondary | ICD-10-CM | POA: Diagnosis not present

## 2012-12-11 DIAGNOSIS — C7951 Secondary malignant neoplasm of bone: Secondary | ICD-10-CM

## 2012-12-11 DIAGNOSIS — Z5111 Encounter for antineoplastic chemotherapy: Secondary | ICD-10-CM | POA: Diagnosis not present

## 2012-12-11 MED ORDER — FULVESTRANT 250 MG/5ML IM SOLN
500.0000 mg | INTRAMUSCULAR | Status: DC
  Administered 2012-12-11: 500 mg via INTRAMUSCULAR
  Filled 2012-12-11: qty 10

## 2012-12-11 NOTE — Progress Notes (Signed)
faslodex 5oo mg given in divided doses to right and left ventrogluteal muscles. Tolerated well.

## 2012-12-17 ENCOUNTER — Ambulatory Visit (HOSPITAL_COMMUNITY): Payer: Medicare Other

## 2012-12-17 ENCOUNTER — Other Ambulatory Visit (HOSPITAL_COMMUNITY): Payer: Medicare Other

## 2012-12-18 DIAGNOSIS — E109 Type 1 diabetes mellitus without complications: Secondary | ICD-10-CM | POA: Diagnosis not present

## 2012-12-18 DIAGNOSIS — H2510 Age-related nuclear cataract, unspecified eye: Secondary | ICD-10-CM | POA: Diagnosis not present

## 2012-12-25 ENCOUNTER — Encounter (HOSPITAL_COMMUNITY): Payer: Medicare Other | Attending: Oncology

## 2012-12-25 VITALS — BP 134/83 | HR 98 | Resp 18

## 2012-12-25 DIAGNOSIS — C50919 Malignant neoplasm of unspecified site of unspecified female breast: Secondary | ICD-10-CM | POA: Insufficient documentation

## 2012-12-25 DIAGNOSIS — C50219 Malignant neoplasm of upper-inner quadrant of unspecified female breast: Secondary | ICD-10-CM | POA: Diagnosis not present

## 2012-12-25 DIAGNOSIS — C7951 Secondary malignant neoplasm of bone: Secondary | ICD-10-CM | POA: Diagnosis not present

## 2012-12-25 DIAGNOSIS — Z5111 Encounter for antineoplastic chemotherapy: Secondary | ICD-10-CM

## 2012-12-25 DIAGNOSIS — C801 Malignant (primary) neoplasm, unspecified: Secondary | ICD-10-CM | POA: Diagnosis not present

## 2012-12-25 LAB — CBC WITH DIFFERENTIAL/PLATELET
Eosinophils Absolute: 0.2 10*3/uL (ref 0.0–0.7)
Eosinophils Relative: 3 % (ref 0–5)
HCT: 39.4 % (ref 36.0–46.0)
Hemoglobin: 12.7 g/dL (ref 12.0–15.0)
Lymphs Abs: 2.3 10*3/uL (ref 0.7–4.0)
MCHC: 32.2 g/dL (ref 30.0–36.0)
Monocytes Relative: 8 % (ref 3–12)
Neutro Abs: 2.6 10*3/uL (ref 1.7–7.7)
Neutrophils Relative %: 47 % (ref 43–77)
Platelets: 170 10*3/uL (ref 150–400)
RDW: 14.8 % (ref 11.5–15.5)
WBC: 5.6 10*3/uL (ref 4.0–10.5)

## 2012-12-25 LAB — COMPREHENSIVE METABOLIC PANEL
ALT: 24 U/L (ref 0–35)
Albumin: 3.2 g/dL — ABNORMAL LOW (ref 3.5–5.2)
Alkaline Phosphatase: 124 U/L — ABNORMAL HIGH (ref 39–117)
BUN: 20 mg/dL (ref 6–23)
Calcium: 9.6 mg/dL (ref 8.4–10.5)
GFR calc Af Amer: 50 mL/min — ABNORMAL LOW (ref >90)
GFR calc non Af Amer: 43 mL/min — ABNORMAL LOW (ref >90)
Glucose, Bld: 417 mg/dL — ABNORMAL HIGH (ref 70–99)
Sodium: 134 mEq/L — ABNORMAL LOW (ref 135–145)
Total Protein: 7.7 g/dL (ref 6.0–8.3)

## 2012-12-25 LAB — CANCER ANTIGEN 27.29: CA 27.29: 37 U/mL (ref 0–39)

## 2012-12-25 MED ORDER — FULVESTRANT 250 MG/5ML IM SOLN
500.0000 mg | INTRAMUSCULAR | Status: DC
  Administered 2012-12-25: 500 mg via INTRAMUSCULAR
  Filled 2012-12-25: qty 10

## 2012-12-25 MED ORDER — DENOSUMAB 120 MG/1.7ML ~~LOC~~ SOLN
120.0000 mg | Freq: Once | SUBCUTANEOUS | Status: AC
  Administered 2012-12-25: 120 mg via SUBCUTANEOUS
  Filled 2012-12-25: qty 1.7

## 2012-12-25 NOTE — Progress Notes (Signed)
Faslodex 500 mg given IM z-track in divided doses to rt and lt buttocks.  Xgeva 120 mg  Given sub-q to lower left abd fat;  Tolerated injections well. Specimen obtained peripherally for labs.

## 2013-01-03 ENCOUNTER — Ambulatory Visit (HOSPITAL_COMMUNITY): Payer: Medicare Other | Admitting: Oncology

## 2013-01-03 DIAGNOSIS — E669 Obesity, unspecified: Secondary | ICD-10-CM | POA: Diagnosis not present

## 2013-01-03 DIAGNOSIS — E119 Type 2 diabetes mellitus without complications: Secondary | ICD-10-CM | POA: Diagnosis not present

## 2013-01-03 DIAGNOSIS — I10 Essential (primary) hypertension: Secondary | ICD-10-CM | POA: Diagnosis not present

## 2013-01-03 DIAGNOSIS — E7800 Pure hypercholesterolemia, unspecified: Secondary | ICD-10-CM | POA: Diagnosis not present

## 2013-01-03 NOTE — Progress Notes (Signed)
Kaylee Antis, MD [address] [place] [address]  Invasive ductal carcinoma of breast, unspecified laterality - Plan: Oxycodone HCl 10 MG TABS, CBC with Differential, Comprehensive metabolic panel, Cancer antigen 27.29  Bone metastasis - Plan: Oxycodone HCl 10 MG TABS, CBC with Differential, Comprehensive metabolic panel, Cancer antigen 27.29  CURRENT THERAPY: Faslodex + Exemestane beginning on 12/11/2012.  Also on Xgeva every 4 weeks for bone metastases.  INTERVAL HISTORY: Kaylee Mitchell 57 y.o. 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 She presented with a 7 cm tumor, extensive LVI. Tumor focally extended to the anterior surface margin, 8 of 8 lymph nodes were involved with metastatic disease. She was ER positive 100%, PR positive 99%, HER2 neu negative, Ki-67 marker 14%. No liver involvement, no lung involvement and she had a mastectomy followed by radiation therapy to T4 through T7 due to severe involvement at this area. She was treated with Arimidex initially from 07/2009 through 03/23/2010 and was progressing by bone scan criteria. We switched her to tamoxifen which she is tolerating well and her bone scan is now stable. She has not received radiation therapy to the chest wall or axilla. She was seen by Dr. Zigmund Daniel on 11/15/2012. At that time, she complained of back pain. She therefore underwent an MRI of lumbar spine in [place], due to her size and unable to have scan here at [facility]. The full report follows, but the gist of the report demonstrates Progressive diffuse lumbar spine metastatic disease with the most progression at the L1 vertebra. The vertebral body marrow space is almost completely replaced with enhancing metastasis. No pathologic compression fracture. With this indication for progression of disease, therapy was changed to Fulvestrant and Exemestane beginning on  12/11/2012.    Invasive ductal carcinoma of breast   05/26/2009 Initial Diagnosis Needle core biopsy showed invasive mammary carcinoma with 1 + lymph node   06/03/2009 Surgery Right modified radical mastectomy with 8/8 + lymph nodes by Dr. Wende Neighbors   07/22/2009 Cancer Staging Bone scan + for multiple osseous lesions.  No disease elsewhere.   07/24/2009 - 03/23/2010 Chemotherapy Arimidex   03/23/2010 Progression    03/23/2010 - 12/05/2012 Chemotherapy Tamoxifen   11/28/2012 Progression Worsening of lumbar spine osseous disease   12/06/2012 -  Chemotherapy Fulvestrant + Exemestane   NCCN guidelines recommends the following for monitoring of metastatic breast cancer:  A. Components of monitoring:   1. Monitoring includes periodic assessment of varied combinations of symptoms, physical examination, routine laboratory tests, imaging studies, and blood biomarkers where appropriate. Results of monitoring are classified as response/continued response to treatment, stable disease, uncertainty regarding disease status, or progression of disease. The clinician typically must assess and balance multiple different forms of information to make a determination regarding whether disease is being controlled and the toxicity of treatment is acceptable. Sometimes, this information may be contradictory.  B. Definition of disease progression:   1. Unequivocal evidence of progression of disease by one or more of these factors is required to establish progression of disease, either because of ineffective therapy or acquired resistance of disease to an applied therapy. Progression of disease may be identified through evidence of growth or worsening of disease at previously known sites of disease and/or of the occurrence of new sites of metastatic disease.   2. Findings concerning for progression of disease include:    A. Worsening  symptoms such as pain or dyspnea    B. Evidence of worsening or new disease on physical  examination.    C. Declining performance status    D. Unexplained weight loss    E. Increasing Alkaline phosphatase, ALT, AST, or bilirubin    F. Hypercalcemia    G. New radiographic abnormality or increase in the size of pre-existing radiographic abnormality.    H. New areas of abnormality on functional imaging (eg, bone scan, PET/CT scan)    I. Increasing tumor markers (eg, CEA, CA 15-3, CA27.29)  Kaylee Mitchell reports that she is tolerating therapy well.  She denies any complaints including hot flashes, arthralgias, and myalgias.  She explains that her back pain has improved on the current pain regimen of Oxycodone HCl 10 mg every 4 hours PRN.  She also reports that her "side pain" has improved since starting the new therapy regimen.  She notes that she is more fatigued as of late.  She notes that she has not been sleeping well and I suspect this is participating in her fatigue.  I have recommended 25-50 mg of Benadryl at HS to see if that help her sleep better.  If not, I would consider Restoril.  She will update [place] in the future regarding her sleeping.  Oncologically, she denies any complaints and ROS questioning is negative.   Past Medical History  Diagnosis Date  . Hypertension   . High cholesterol   . Knee pain, bilateral 2013  . Cancer     rt breast is primary  . Bone metastasis 09/13/2010  . Diabetes mellitus     type II  . Depression   . Depression 12/14/2011  . 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     has No Known Allergies.  Kaylee Mitchell had no medications administered during this visit.  Past  Surgical History  Procedure Laterality Date  . Knee surgery      right-arthroscopy  . Mastectomy      right side  . Abdominal hysterectomy      Denies any headaches, dizziness, double vision, fevers, chills, night sweats, nausea, vomiting, diarrhea, constipation, chest pain, heart palpitations, shortness of breath, blood in stool, black tarry stool, urinary pain, urinary burning, urinary frequency, hematuria.   PHYSICAL EXAMINATION  ECOG PERFORMANCE STATUS: 1 - Symptomatic but completely ambulatory  Filed Vitals:   01/04/13 0800  BP: 153/92  Pulse: 99  Temp: 98.2 F (36.8 C)  Resp: 20    GENERAL:alert, no distress, well nourished, well developed, comfortable, cooperative, obese and smiling SKIN: skin color, texture, turgor are normal, no rashes or significant lesions HEAD: Normocephalic, No masses, lesions, tenderness or abnormalities EYES: normal, PERRLA, EOMI, Conjunctiva are pink and non-injected EARS: External ears normal OROPHARYNX:mucous membranes are moist  NECK: supple, no adenopathy, thyroid normal size, non-tender, without nodularity, no stridor, non-tender, trachea midline LYMPH:  no palpable lymphadenopathy BREAST:not examined LUNGS: clear to auscultation and percussion HEART: regular rate & rhythm, no murmurs and no gallops ABDOMEN:abdomen soft, obese and normal bowel sounds BACK: Back symmetric, no curvature. EXTREMITIES:less then 2 second capillary refill, no joint deformities, effusion, or inflammation, no edema, no clubbing, no cyanosis  NEURO: alert & oriented  x 3 with fluent speech, no focal motor/sensory deficits, gait normal    LABORATORY DATA: CBC    Component Value Date/Time   WBC 5.6 12/25/2012 0901   RBC 4.57 12/25/2012 0901   HGB 12.7 12/25/2012 0901   HCT 39.4 12/25/2012 0901   PLT 170 12/25/2012 0901   MCV 86.2 12/25/2012 0901   MCH 27.8 12/25/2012 0901   MCHC 32.2 12/25/2012 0901   RDW 14.8 12/25/2012 0901   LYMPHSABS 2.3 12/25/2012 0901    MONOABS 0.4 12/25/2012 0901   EOSABS 0.2 12/25/2012 0901   BASOSABS 0.0 12/25/2012 0901      Chemistry      Component Value Date/Time   NA 134* 12/25/2012 0901   K 4.2 12/25/2012 0901   CL 97 12/25/2012 0901   CO2 27 12/25/2012 0901   BUN 20 12/25/2012 0901   CREATININE 1.32* 12/25/2012 0901      Component Value Date/Time   CALCIUM 9.6 12/25/2012 0901   ALKPHOS 124* 12/25/2012 0901   AST 29 12/25/2012 0901   ALT 24 12/25/2012 0901   BILITOT 0.4 12/25/2012 0901     Lab Results  Component Value Date   LABCA2 37 12/25/2012      ASSESSMENT:  1. 1. 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 She presented with a 7 cm tumor, extensive LVI. Tumor focally extended to the anterior surface margin, 8 of 8 lymph nodes were involved with metastatic disease. She was ER positive 100%, PR positive 99%, HER2 neu negative, Ki-67 marker 14%. No liver involvement, no lung involvement and she had a mastectomy followed by radiation therapy to T4 through T7 due to severe involvement at this area. She was treated with Arimidex initially from 07/2009 through 03/23/2010 and was progressing by bone scan criteria. We switched her to tamoxifen which she is tolerating well and her bone scan is now stable. She has not received radiation therapy to the chest wall or axilla. MRI of lumbar spine completed on 11/29/2012 demonstrated rogressive diffuse lumbar spine metastatic disease and therefore therapy was changed to Fulvestrant and Exemestane on 12/11/2012. 2. 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  3. Obesity.  4. Diabetes mellitus.  5. History of depression on therapy.  6. Poor dental hygiene with multiple missing teeth  7. Poor venous access  8. Fatigue 9. Insomnia   Patient Active Problem List   Diagnosis Date Noted  . URI, acute 11/06/2012  . Lumbar back pain 08/07/2012  . Tinea versicolor 02/11/2012  . Diabetes  mellitus with chronic kidney disease 01/12/2012  . CKD (chronic kidney disease) 01/12/2012  . Hyperlipidemia 01/12/2012  . Essential hypertension, benign 01/12/2012  . Morbid obesity 01/12/2012  . Depression 12/14/2011  . Difficulty in walking 03/25/2011  . S/P arthroscopy of left knee 03/21/2011  . Medial meniscus, posterior horn derangement 03/09/2011  . Invasive ductal carcinoma of breast 09/13/2010  . Bone metastasis 09/13/2010     PLAN:  1. I personally reviewed and went over laboratory results with the patient.  2. I personally reviewed and went over radiographic studies with the patient.  3. Continue Xgeva every 4 weeks for bone mets (last given on 12/25/2012)  4. Labs every 4 weeks: CBC diff, CMET, CA 27.29 5. Continue Exemestane 6. Continue Fulvestrant maintenance 7. I reviewed NCCN guidelines with the patient for definition of progression of disease.  8. Recommend OTC Benadryl 1 tab at HS and if  failed try 2 tabs at HS.  9. Faslodex 500 mg IM injection on 12/22 as scheduled 10. Rx for Oxycodone 10 mg tabs #100 11. Return in 2 months for follow-up   THERAPY PLAN:  NCCN guidelines recommends the following for monitoring of metastatic breast cancer:  A. Components of monitoring:   1. Monitoring includes periodic assessment of varied combinations of symptoms, physical examination, routine laboratory tests, imaging studies, and blood biomarkers where appropriate. Results of monitoring are classified as response/continued response to treatment, stable disease, uncertainty regarding disease status, or progression of disease. The clinician typically must assess and balance multiple different forms of information to make a determination regarding whether disease is being controlled and the toxicity of treatment is acceptable. Sometimes, this information may be contradictory.  B. Definition of disease progression:   1. Unequivocal evidence of progression of disease by one or more of  these factors is required to establish progression of disease, either because of ineffective therapy or acquired resistance of disease to an applied therapy.  Progression of disease may be identified through evidence of growth or worsening of disease at previously known sites of disease and/or of the occurrence of new sites of metastatic disease.   2. Findings concerning for progression of disease include:    A. Worsening symptoms such as pain or dyspnea    B. Evidence of worsening or new disease on physical examination.    C. Declining performance status    D. Unexplained weight loss    E. Increasing Alkaline phosphatase, ALT, AST, or bilirubin    F. Hypercalcemia    G. New radiographic abnormality or increase in the size of pre-existing radiographic abnormality.    H. New areas of abnormality on functional imaging (eg, bone scan, PET/CT scan)    I. Increasing tumor markers (eg, CEA, CA 15-3, CA27.29)  All questions were answered. The patient knows to call the clinic with any problems, questions or concerns. We can certainly see the patient much sooner if necessary.  Patient and plan discussed with Dr. Alla German and he is in agreement with the aforementioned.   Kaylee Mitchell

## 2013-01-04 ENCOUNTER — Encounter (HOSPITAL_BASED_OUTPATIENT_CLINIC_OR_DEPARTMENT_OTHER): Payer: Medicare Other | Admitting: Oncology

## 2013-01-04 ENCOUNTER — Encounter (HOSPITAL_COMMUNITY): Payer: Self-pay | Admitting: Oncology

## 2013-01-04 VITALS — BP 153/92 | HR 99 | Temp 98.2°F | Resp 20 | Wt 302.5 lb

## 2013-01-04 DIAGNOSIS — C7951 Secondary malignant neoplasm of bone: Secondary | ICD-10-CM | POA: Diagnosis not present

## 2013-01-04 DIAGNOSIS — C50219 Malignant neoplasm of upper-inner quadrant of unspecified female breast: Secondary | ICD-10-CM

## 2013-01-04 DIAGNOSIS — C50919 Malignant neoplasm of unspecified site of unspecified female breast: Secondary | ICD-10-CM

## 2013-01-04 MED ORDER — OXYCODONE HCL 10 MG PO TABS: 10.0000 mg | ORAL_TABLET | ORAL | Status: DC | PRN

## 2013-01-04 NOTE — Patient Instructions (Addendum)
[facility] Discharge Instructions  RECOMMENDATIONS MADE BY THE CONSULTANT AND ANY TEST RESULTS WILL BE SENT TO YOUR REFERRING PHYSICIAN.  EXAM FINDINGS BY THE PHYSICIAN TODAY AND SIGNS OR SYMPTOMS TO REPORT TO CLINIC OR PRIMARY PHYSICIAN: Exam and findings as discussed by T. Jacalyn Lefevre, PA-C .  MEDICATIONS PRESCRIBED:  1.  Oxycodone refill given  INSTRUCTIONS/FOLLOW-UP: 1.  Faslodex every 4 weeks as scheduled.  Next dose is 01/07/13. 2.  Xgeva every 4 weeks as scheduled.  Next dose is 01/25/12. 3.  Return every 4 weeks for labs. 4.  Return in 2 months to see Elijah Birk again. 5.  Continue Aromasin. 6.  You can take Benadryl 1 tablet at bedtime; increase to 2 tablets at bedtime if needed.  Call if that doesn't help.  Thank you for choosing [facility] to provide your oncology and hematology care.  To afford each patient quality time with our providers, please arrive at least 15 minutes before your scheduled appointment time.  With your help, our goal is to use those 15 minutes to complete the necessary work-up to ensure our physicians have the information they need to help with your evaluation and healthcare recommendations.    Effective January 1st, 2014, we ask that you re-schedule your appointment with our physicians should you arrive 10 or more minutes late for your appointment.  We strive to give you quality time with our providers, and arriving late affects you and other patients whose appointments are after yours.    Again, thank you for choosing [facility].  Our hope is that these requests will decrease the amount of time that you wait before being seen by our physicians.       _____________________________________________________________  Should you have questions after your visit to [facility], please contact our office at [phone] between the hours of 8:30 a.m. and 5:00 p.m.  Voicemails left after 4:30 p.m. will not be  returned until the following business day.  For prescription refill requests, have your pharmacy contact our office with your prescription refill request.

## 2013-01-07 ENCOUNTER — Encounter (HOSPITAL_BASED_OUTPATIENT_CLINIC_OR_DEPARTMENT_OTHER): Payer: Medicare Other

## 2013-01-07 VITALS — BP 148/94 | HR 105

## 2013-01-07 DIAGNOSIS — Z5111 Encounter for antineoplastic chemotherapy: Secondary | ICD-10-CM | POA: Diagnosis not present

## 2013-01-07 DIAGNOSIS — C7951 Secondary malignant neoplasm of bone: Secondary | ICD-10-CM | POA: Diagnosis not present

## 2013-01-07 DIAGNOSIS — C50919 Malignant neoplasm of unspecified site of unspecified female breast: Secondary | ICD-10-CM | POA: Diagnosis not present

## 2013-01-07 MED ORDER — FULVESTRANT 250 MG/5ML IM SOLN
500.0000 mg | INTRAMUSCULAR | Status: DC
  Administered 2013-01-07: 500 mg via INTRAMUSCULAR
  Filled 2013-01-07: qty 10

## 2013-01-07 NOTE — Progress Notes (Signed)
Faslodex 500 mg given IM in divided doses to right and left buttocks by Chapman Moss, RN and B. Antony Salmon, RN.  Tolerated well.

## 2013-01-09 ENCOUNTER — Ambulatory Visit (HOSPITAL_COMMUNITY): Payer: Medicare Other

## 2013-01-18 DIAGNOSIS — E119 Type 2 diabetes mellitus without complications: Secondary | ICD-10-CM | POA: Diagnosis not present

## 2013-01-18 DIAGNOSIS — E7800 Pure hypercholesterolemia, unspecified: Secondary | ICD-10-CM | POA: Diagnosis not present

## 2013-01-18 DIAGNOSIS — I10 Essential (primary) hypertension: Secondary | ICD-10-CM | POA: Diagnosis not present

## 2013-01-18 DIAGNOSIS — E669 Obesity, unspecified: Secondary | ICD-10-CM | POA: Diagnosis not present

## 2013-01-21 DIAGNOSIS — E669 Obesity, unspecified: Secondary | ICD-10-CM | POA: Diagnosis not present

## 2013-01-21 DIAGNOSIS — E119 Type 2 diabetes mellitus without complications: Secondary | ICD-10-CM | POA: Diagnosis not present

## 2013-01-21 DIAGNOSIS — E7800 Pure hypercholesterolemia, unspecified: Secondary | ICD-10-CM | POA: Diagnosis not present

## 2013-01-21 DIAGNOSIS — I10 Essential (primary) hypertension: Secondary | ICD-10-CM | POA: Diagnosis not present

## 2013-01-24 ENCOUNTER — Encounter (HOSPITAL_COMMUNITY): Payer: Medicare Other

## 2013-02-04 ENCOUNTER — Encounter (HOSPITAL_COMMUNITY): Payer: Medicare Other | Attending: Oncology

## 2013-02-04 ENCOUNTER — Encounter (HOSPITAL_BASED_OUTPATIENT_CLINIC_OR_DEPARTMENT_OTHER): Payer: Medicare Other

## 2013-02-04 VITALS — BP 123/79 | HR 108 | Temp 98.6°F | Resp 20

## 2013-02-04 DIAGNOSIS — C7951 Secondary malignant neoplasm of bone: Secondary | ICD-10-CM

## 2013-02-04 DIAGNOSIS — C50919 Malignant neoplasm of unspecified site of unspecified female breast: Secondary | ICD-10-CM | POA: Diagnosis not present

## 2013-02-04 DIAGNOSIS — C50219 Malignant neoplasm of upper-inner quadrant of unspecified female breast: Secondary | ICD-10-CM

## 2013-02-04 DIAGNOSIS — C7952 Secondary malignant neoplasm of bone marrow: Secondary | ICD-10-CM

## 2013-02-04 DIAGNOSIS — Z5111 Encounter for antineoplastic chemotherapy: Secondary | ICD-10-CM | POA: Diagnosis not present

## 2013-02-04 LAB — CBC WITH DIFFERENTIAL/PLATELET
BASOS ABS: 0 10*3/uL (ref 0.0–0.1)
Basophils Relative: 0 % (ref 0–1)
EOS PCT: 4 % (ref 0–5)
Eosinophils Absolute: 0.2 10*3/uL (ref 0.0–0.7)
HCT: 38.5 % (ref 36.0–46.0)
Hemoglobin: 12.8 g/dL (ref 12.0–15.0)
LYMPHS PCT: 37 % (ref 12–46)
Lymphs Abs: 2.2 10*3/uL (ref 0.7–4.0)
MCH: 29.5 pg (ref 26.0–34.0)
MCHC: 33.2 g/dL (ref 30.0–36.0)
MCV: 88.7 fL (ref 78.0–100.0)
MONO ABS: 0.4 10*3/uL (ref 0.1–1.0)
Monocytes Relative: 7 % (ref 3–12)
Neutro Abs: 3.1 10*3/uL (ref 1.7–7.7)
Neutrophils Relative %: 52 % (ref 43–77)
Platelets: 150 10*3/uL (ref 150–400)
RBC: 4.34 MIL/uL (ref 3.87–5.11)
RDW: 15.3 % (ref 11.5–15.5)
WBC: 6 10*3/uL (ref 4.0–10.5)

## 2013-02-04 LAB — COMPREHENSIVE METABOLIC PANEL
ALT: 19 U/L (ref 0–35)
AST: 23 U/L (ref 0–37)
Albumin: 3.1 g/dL — ABNORMAL LOW (ref 3.5–5.2)
Alkaline Phosphatase: 79 U/L (ref 39–117)
BUN: 24 mg/dL — ABNORMAL HIGH (ref 6–23)
CALCIUM: 9.5 mg/dL (ref 8.4–10.5)
CO2: 28 meq/L (ref 19–32)
CREATININE: 1.38 mg/dL — AB (ref 0.50–1.10)
Chloride: 100 mEq/L (ref 96–112)
GFR calc Af Amer: 47 mL/min — ABNORMAL LOW (ref >90)
GFR, EST NON AFRICAN AMERICAN: 41 mL/min — AB (ref >90)
Glucose, Bld: 227 mg/dL — ABNORMAL HIGH (ref 70–99)
Potassium: 4.2 mEq/L (ref 3.7–5.3)
SODIUM: 139 meq/L (ref 137–147)
TOTAL PROTEIN: 7.4 g/dL (ref 6.0–8.3)
Total Bilirubin: 0.5 mg/dL (ref 0.3–1.2)

## 2013-02-04 LAB — CANCER ANTIGEN 27.29: CA 27.29: 27 U/mL (ref 0–39)

## 2013-02-04 MED ORDER — FULVESTRANT 250 MG/5ML IM SOLN
500.0000 mg | INTRAMUSCULAR | Status: DC
  Administered 2013-02-04: 500 mg via INTRAMUSCULAR
  Filled 2013-02-04: qty 10

## 2013-02-04 MED ORDER — OXYCODONE HCL 10 MG PO TABS: 10.0000 mg | ORAL_TABLET | ORAL | Status: DC | PRN

## 2013-02-04 MED ORDER — DENOSUMAB 120 MG/1.7ML ~~LOC~~ SOLN
120.0000 mg | Freq: Once | SUBCUTANEOUS | Status: AC
  Administered 2013-02-04: 120 mg via SUBCUTANEOUS
  Filled 2013-02-04: qty 1.7

## 2013-02-04 NOTE — Progress Notes (Signed)
Kaylee Mitchell presents today for injection per MD orders. Xgeva 120 mg administered SQ in left Abdomen. Administration without incident. Patient tolerated well. Kaylee Mitchell presents today for injection per MD orders. Faslodex 500 mg in 2 doses of 250 mg administered IM in both left and right upper outer gluteals. Administration without incident. Patient tolerated well.

## 2013-02-04 NOTE — Addendum Note (Signed)
Addended by: Baird Cancer on: 02/04/2013 10:22 AM   Modules accepted: Orders

## 2013-02-04 NOTE — Progress Notes (Signed)
Labs drawn today for ca2729,cbc/diff,cmp

## 2013-02-06 ENCOUNTER — Ambulatory Visit: Payer: Medicare Other | Admitting: Family Medicine

## 2013-02-26 DIAGNOSIS — E119 Type 2 diabetes mellitus without complications: Secondary | ICD-10-CM | POA: Diagnosis not present

## 2013-02-26 DIAGNOSIS — J410 Simple chronic bronchitis: Secondary | ICD-10-CM | POA: Diagnosis not present

## 2013-03-04 ENCOUNTER — Encounter (HOSPITAL_COMMUNITY): Payer: Self-pay

## 2013-03-04 ENCOUNTER — Encounter (HOSPITAL_BASED_OUTPATIENT_CLINIC_OR_DEPARTMENT_OTHER): Payer: Medicare Other

## 2013-03-04 ENCOUNTER — Encounter (HOSPITAL_COMMUNITY): Payer: Medicare Other | Attending: Oncology

## 2013-03-04 VITALS — BP 140/94 | HR 94 | Temp 98.0°F | Resp 20 | Wt 306.0 lb

## 2013-03-04 DIAGNOSIS — E119 Type 2 diabetes mellitus without complications: Secondary | ICD-10-CM | POA: Insufficient documentation

## 2013-03-04 DIAGNOSIS — N959 Unspecified menopausal and perimenopausal disorder: Secondary | ICD-10-CM | POA: Diagnosis not present

## 2013-03-04 DIAGNOSIS — C7952 Secondary malignant neoplasm of bone marrow: Secondary | ICD-10-CM | POA: Diagnosis not present

## 2013-03-04 DIAGNOSIS — C50919 Malignant neoplasm of unspecified site of unspecified female breast: Secondary | ICD-10-CM

## 2013-03-04 DIAGNOSIS — G4700 Insomnia, unspecified: Secondary | ICD-10-CM

## 2013-03-04 DIAGNOSIS — Z5111 Encounter for antineoplastic chemotherapy: Secondary | ICD-10-CM

## 2013-03-04 DIAGNOSIS — C7951 Secondary malignant neoplasm of bone: Secondary | ICD-10-CM | POA: Insufficient documentation

## 2013-03-04 DIAGNOSIS — C50219 Malignant neoplasm of upper-inner quadrant of unspecified female breast: Secondary | ICD-10-CM | POA: Diagnosis not present

## 2013-03-04 LAB — COMPREHENSIVE METABOLIC PANEL
ALBUMIN: 3.3 g/dL — AB (ref 3.5–5.2)
ALT: 25 U/L (ref 0–35)
AST: 33 U/L (ref 0–37)
Alkaline Phosphatase: 76 U/L (ref 39–117)
BUN: 24 mg/dL — ABNORMAL HIGH (ref 6–23)
CALCIUM: 9.8 mg/dL (ref 8.4–10.5)
CHLORIDE: 103 meq/L (ref 96–112)
CO2: 28 mEq/L (ref 19–32)
Creatinine, Ser: 1.33 mg/dL — ABNORMAL HIGH (ref 0.50–1.10)
GFR calc Af Amer: 50 mL/min — ABNORMAL LOW (ref >90)
GFR calc non Af Amer: 43 mL/min — ABNORMAL LOW (ref >90)
Glucose, Bld: 106 mg/dL — ABNORMAL HIGH (ref 70–99)
Potassium: 4.2 mEq/L (ref 3.7–5.3)
Sodium: 142 mEq/L (ref 137–147)
Total Bilirubin: 0.4 mg/dL (ref 0.3–1.2)
Total Protein: 7.8 g/dL (ref 6.0–8.3)

## 2013-03-04 LAB — CBC WITH DIFFERENTIAL/PLATELET
BASOS ABS: 0 10*3/uL (ref 0.0–0.1)
BASOS PCT: 0 % (ref 0–1)
Eosinophils Absolute: 0.2 10*3/uL (ref 0.0–0.7)
Eosinophils Relative: 3 % (ref 0–5)
HCT: 42.1 % (ref 36.0–46.0)
Hemoglobin: 13.5 g/dL (ref 12.0–15.0)
Lymphocytes Relative: 37 % (ref 12–46)
Lymphs Abs: 2.4 10*3/uL (ref 0.7–4.0)
MCH: 28.3 pg (ref 26.0–34.0)
MCHC: 32.1 g/dL (ref 30.0–36.0)
MCV: 88.3 fL (ref 78.0–100.0)
Monocytes Absolute: 0.4 10*3/uL (ref 0.1–1.0)
Monocytes Relative: 7 % (ref 3–12)
NEUTROS ABS: 3.5 10*3/uL (ref 1.7–7.7)
Neutrophils Relative %: 53 % (ref 43–77)
PLATELETS: 177 10*3/uL (ref 150–400)
RBC: 4.77 MIL/uL (ref 3.87–5.11)
RDW: 14.9 % (ref 11.5–15.5)
WBC: 6.6 10*3/uL (ref 4.0–10.5)

## 2013-03-04 LAB — CANCER ANTIGEN 27.29: CA 27.29: 21 U/mL (ref 0–39)

## 2013-03-04 MED ORDER — OXYCODONE HCL 10 MG PO TABS: 10.0000 mg | ORAL_TABLET | ORAL | Status: DC | PRN

## 2013-03-04 MED ORDER — FULVESTRANT 250 MG/5ML IM SOLN
500.0000 mg | INTRAMUSCULAR | Status: DC
  Administered 2013-03-04: 500 mg via INTRAMUSCULAR
  Filled 2013-03-04: qty 10

## 2013-03-04 MED ORDER — VENLAFAXINE HCL ER 37.5 MG PO CP24
ORAL_CAPSULE | ORAL | Status: DC
Start: 2013-03-04 — End: 2013-07-02

## 2013-03-04 MED ORDER — DENOSUMAB 120 MG/1.7ML ~~LOC~~ SOLN
120.0000 mg | Freq: Once | SUBCUTANEOUS | Status: AC
  Administered 2013-03-04: 120 mg via SUBCUTANEOUS
  Filled 2013-03-04: qty 1.7

## 2013-03-04 NOTE — Progress Notes (Signed)
[name]  OFFICE PROGRESS NOTE  Vic Blackbird, MD [address] [place] [address]  DIAGNOSIS: Breast CA - Plan: Insulin Glargine (LANTUS) 100 UNIT/ML Solostar Pen, SCHEDULING COMMUNICATION, denosumab (XGEVA) injection 120 mg, Comprehensive metabolic panel, SCHEDULING COMMUNICATION INJECTION, fulvestrant (FASLODEX) injection 500 mg, venlafaxine XR (EFFEXOR XR) 37.5 MG 24 hr capsule  Bone metastasis - Plan: Insulin Glargine (LANTUS) 100 UNIT/ML Solostar Pen, SCHEDULING COMMUNICATION, denosumab (XGEVA) injection 120 mg, Comprehensive metabolic panel, SCHEDULING COMMUNICATION INJECTION, fulvestrant (FASLODEX) injection 500 mg, venlafaxine XR (EFFEXOR XR) 37.5 MG 24 hr capsule  Type II or unspecified type diabetes mellitus without mention of complication, not stated as uncontrolled - Plan: Insulin Glargine (LANTUS) 100 UNIT/ML Solostar Pen, SCHEDULING COMMUNICATION, denosumab (XGEVA) injection 120 mg, Comprehensive metabolic panel, SCHEDULING COMMUNICATION INJECTION, fulvestrant (FASLODEX) injection 500 mg, venlafaxine XR (EFFEXOR XR) 37.5 MG 24 hr capsule  Chief Complaint  Patient presents with  . Breast cancer with bone metastases    CURRENT THERAPY: Faslodex and Xgeva monthly, exemestane 25 mg orally daily.  INTERVAL HISTORY: Kaylee Mitchell 58 y.o. female returns for followup of stage IV breast cancer with bone metastases while taking exemestane 25 mg daily and receiving Faslodex and Xgeva parenterally monthly.  Pain is controlled on oxycodone 10 mg every 4 hours around the clock. Patient is having difficulty sleeping because of hot flashes despite taking Effexor XR 37.5 mg at bedtime. She denies any PND, orthopnea, palpitations, but has had an upper respiratory nasal stuffiness for which she was seen by her family physician a few days ago. She denies any polyuria, polydipsia, melena, hematochezia, hematuria, lower extremity swelling or  redness, skin rash, headache, or seizures.  MEDICAL HISTORY: Past Medical History  Diagnosis Date  . Hypertension   . High cholesterol   . Knee pain, bilateral 2013  . Cancer     rt breast is primary  . Bone metastasis 09/13/2010  . Diabetes mellitus     type II  . Depression   . Depression 12/14/2011  . Invasive ductal carcinoma of breast 09/13/2010    INTERIM HISTORY: has Invasive ductal carcinoma of breast; Bone metastasis; Medial meniscus, posterior horn derangement; S/P arthroscopy of left knee; Difficulty in walking; Depression; Diabetes mellitus with chronic kidney disease; CKD (chronic kidney disease); Hyperlipidemia; Essential hypertension, benign; Morbid obesity; Tinea versicolor; Lumbar back pain; and URI, acute on her problem list.   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 She presented with a 7 cm tumor, extensive LVI. Tumor focally extended to the anterior surface margin, 8 of 8 lymph nodes were involved with metastatic disease. She was ER positive 100%, PR positive 99%, HER2 neu negative, Ki-67 marker 14%. No liver involvement, no lung involvement and she had a mastectomy followed by radiation therapy to T4 through T7 due to severe involvement at this area. She was treated with Arimidex initially from 07/2009 through 03/23/2010 and was progressing by bone scan criteria. We switched her to tamoxifen which she is tolerating well and her bone scan is now stable. She has not received radiation therapy to the chest wall or axilla. On 11/15/2012 she complained of back pain. She therefore underwent an MRI of lumbar spine in [place], due to her size and unable to have scan here at [facility]. The full report follows, but the gist of the report demonstrates Progressive diffuse lumbar spine metastatic disease with the most progression at the  L1 vertebra. The vertebral body marrow space is almost completely replaced with enhancing metastasis. No  pathologic compression fracture. With this indication for progression of disease, therapy was changed to Fulvestrant and Exemestane beginning on 12/11/2012. Invasive ductal carcinoma of breast  05/26/2009  Initial Diagnosis  Needle core biopsy showed invasive mammary carcinoma with 1 + lymph node  06/03/2009  Surgery  Right modified radical mastectomy with 8/8 + lymph nodes by Dr. Ronnell Freshwater  07/22/2009  Cancer Staging  Bone scan + for multiple osseous lesions. No disease elsewhere.  07/24/2009 - 03/23/2010  Chemotherapy  Arimidex  03/23/2010  Progression  03/23/2010 - 12/05/2012  Chemotherapy  Tamoxifen  11/28/2012  Progression  Worsening of lumbar spine osseous disease  12/06/2012 -  Chemotherapy  Fulvestrant + Exemestane + Xgeva  ALLERGIES:  has No Known Allergies.  MEDICATIONS: has a current medication list which includes the following prescription(s): aspirin ec, atorvastatin, benzonatate, calcium-vitamin d, carvedilol, exemestane, insulin glargine, insulin lispro, lisinopril-hydrochlorothiazide, naproxen, oxycodone hcl, polyethylene glycol 3350, sertraline, and venlafaxine xr, and the following Facility-Administered Medications: 1 mL EPINEPHrine 1 mg/mL (1:1000) in 0.9% Normal Saline 3000 mL irrigation and fulvestrant.  SURGICAL HISTORY:  Past Surgical History  Procedure Laterality Date  . Knee surgery      right-arthroscopy  . Mastectomy      right side  . Abdominal hysterectomy      FAMILY HISTORY: family history includes Arthritis in an other family member; Cancer in her maternal aunt and another family member; Diabetes in her father, mother, and another family member. There is no history of Anesthesia problems, Hypotension, Malignant hyperthermia, or Pseudochol deficiency.  SOCIAL HISTORY:  reports that she has never smoked. She has never used smokeless tobacco. She reports that she does not drink alcohol or use illicit drugs.  REVIEW OF SYSTEMS:  Other than that  discussed above is noncontributory.  PHYSICAL EXAMINATION: ECOG PERFORMANCE STATUS: 1 - Symptomatic but completely ambulatory  Blood pressure 140/94, pulse 94, temperature 98 F (36.7 C), temperature source Oral, resp. rate 20, weight 306 lb (138.801 kg).  GENERAL:alert, no distress and comfortable. Moderately obese. SKIN: skin color, texture, turgor are normal, no rashes or significant lesions EYES: PERLA; Conjunctiva are pink and non-injected, sclera clear OROPHARYNX:no exudate, no erythema on lips, buccal mucosa, or tongue. NECK: supple, thyroid normal size, non-tender, without nodularity. No masses CHEST: Status post right mastectomy with no subcutaneous nodules. LYMPH:  no palpable lymphadenopathy in the cervical, axillary or inguinal LUNGS: clear to auscultation and percussion with normal breathing effort HEART: regular rate & rhythm and no murmurs. ABDOMEN:abdomen soft, non-tender and normal bowel sounds MUSCULOSKELETAL:no cyanosis of digits and no clubbing. Range of motion normal. . No lymphedema. NEURO: alert & oriented x 3 with fluent speech, no focal motor/sensory deficits   LABORATORY DATA: Infusion on 03/04/2013  Component Date Value Ref Range Status  . WBC 03/04/2013 6.6  4.0 - 10.5 K/uL Final  . RBC 03/04/2013 4.77  3.87 - 5.11 MIL/uL Final  . Hemoglobin 03/04/2013 13.5  12.0 - 15.0 g/dL Final  . HCT 03/04/2013 42.1  36.0 - 46.0 % Final  . MCV 03/04/2013 88.3  78.0 - 100.0 fL Final  . MCH 03/04/2013 28.3  26.0 - 34.0 pg Final  . MCHC 03/04/2013 32.1  30.0 - 36.0 g/dL Final  . RDW 03/04/2013 14.9  11.5 - 15.5 % Final  . Platelets 03/04/2013 177  150 - 400 K/uL Final  . Neutrophils Relative % 03/04/2013 53  43 - 77 %  Final  . Neutro Abs 03/04/2013 3.5  1.7 - 7.7 K/uL Final  . Lymphocytes Relative 03/04/2013 37  12 - 46 % Final  . Lymphs Abs 03/04/2013 2.4  0.7 - 4.0 K/uL Final  . Monocytes Relative 03/04/2013 7  3 - 12 % Final  . Monocytes Absolute 03/04/2013 0.4   0.1 - 1.0 K/uL Final  . Eosinophils Relative 03/04/2013 3  0 - 5 % Final  . Eosinophils Absolute 03/04/2013 0.2  0.0 - 0.7 K/uL Final  . Basophils Relative 03/04/2013 0  0 - 1 % Final  . Basophils Absolute 03/04/2013 0.0  0.0 - 0.1 K/uL Final  . Sodium 03/04/2013 142  137 - 147 mEq/L Final  . Potassium 03/04/2013 4.2  3.7 - 5.3 mEq/L Final  . Chloride 03/04/2013 103  96 - 112 mEq/L Final  . CO2 03/04/2013 28  19 - 32 mEq/L Final  . Glucose, Bld 03/04/2013 106* 70 - 99 mg/dL Final  . BUN 03/04/2013 24* 6 - 23 mg/dL Final  . Creatinine, Ser 03/04/2013 1.33* 0.50 - 1.10 mg/dL Final  . Calcium 03/04/2013 9.8  8.4 - 10.5 mg/dL Final  . Total Protein 03/04/2013 7.8  6.0 - 8.3 g/dL Final  . Albumin 03/04/2013 3.3* 3.5 - 5.2 g/dL Final  . AST 03/04/2013 33  0 - 37 U/L Final  . ALT 03/04/2013 25  0 - 35 U/L Final  . Alkaline Phosphatase 03/04/2013 76  39 - 117 U/L Final  . Total Bilirubin 03/04/2013 0.4  0.3 - 1.2 mg/dL Final  . GFR calc non Af Amer 03/04/2013 43* >90 mL/min Final  . GFR calc Af Amer 03/04/2013 50* >90 mL/min Final   Comment: (NOTE)                          The eGFR has been calculated using the CKD EPI equation.                          This calculation has not been validated in all clinical situations.                          eGFR's persistently <90 mL/min signify possible Chronic Kidney                          Disease.  Infusion on 02/04/2013  Component Date Value Ref Range Status  . WBC 02/04/2013 6.0  4.0 - 10.5 K/uL Final  . RBC 02/04/2013 4.34  3.87 - 5.11 MIL/uL Final  . Hemoglobin 02/04/2013 12.8  12.0 - 15.0 g/dL Final  . HCT 02/04/2013 38.5  36.0 - 46.0 % Final  . MCV 02/04/2013 88.7  78.0 - 100.0 fL Final  . MCH 02/04/2013 29.5  26.0 - 34.0 pg Final  . MCHC 02/04/2013 33.2  30.0 - 36.0 g/dL Final  . RDW 02/04/2013 15.3  11.5 - 15.5 % Final  . Platelets 02/04/2013 150  150 - 400 K/uL Final  . Neutrophils Relative % 02/04/2013 52  43 - 77 % Final  . Neutro  Abs 02/04/2013 3.1  1.7 - 7.7 K/uL Final  . Lymphocytes Relative 02/04/2013 37  12 - 46 % Final  . Lymphs Abs 02/04/2013 2.2  0.7 - 4.0 K/uL Final  . Monocytes Relative 02/04/2013 7  3 - 12 % Final  . Monocytes Absolute 02/04/2013 0.4  0.1 - 1.0 K/uL Final  . Eosinophils Relative 02/04/2013 4  0 - 5 % Final  . Eosinophils Absolute 02/04/2013 0.2  0.0 - 0.7 K/uL Final  . Basophils Relative 02/04/2013 0  0 - 1 % Final  . Basophils Absolute 02/04/2013 0.0  0.0 - 0.1 K/uL Final  . Sodium 02/04/2013 139  137 - 147 mEq/L Final  . Potassium 02/04/2013 4.2  3.7 - 5.3 mEq/L Final  . Chloride 02/04/2013 100  96 - 112 mEq/L Final  . CO2 02/04/2013 28  19 - 32 mEq/L Final  . Glucose, Bld 02/04/2013 227* 70 - 99 mg/dL Final  . BUN 02/04/2013 24* 6 - 23 mg/dL Final  . Creatinine, Ser 02/04/2013 1.38* 0.50 - 1.10 mg/dL Final  . Calcium 02/04/2013 9.5  8.4 - 10.5 mg/dL Final  . Total Protein 02/04/2013 7.4  6.0 - 8.3 g/dL Final  . Albumin 02/04/2013 3.1* 3.5 - 5.2 g/dL Final  . AST 02/04/2013 23  0 - 37 U/L Final  . ALT 02/04/2013 19  0 - 35 U/L Final  . Alkaline Phosphatase 02/04/2013 79  39 - 117 U/L Final  . Total Bilirubin 02/04/2013 0.5  0.3 - 1.2 mg/dL Final  . GFR calc non Af Amer 02/04/2013 41* >90 mL/min Final  . GFR calc Af Amer 02/04/2013 47* >90 mL/min Final   Comment: (NOTE)                          The eGFR has been calculated using the CKD EPI equation.                          This calculation has not been validated in all clinical situations.                          eGFR's persistently <90 mL/min signify possible Chronic Kidney                          Disease.  Marland Kitchen CA 27.29 02/04/2013 27  0 - 39 U/mL Final   Performed at [facility]: No new pathology.  Urinalysis    Component Value Date/Time   COLORURINE YELLOW 11/15/2012 0749   APPEARANCEUR CLEAR 11/15/2012 0749   LABSPEC >1.030* 11/15/2012 0749   PHURINE 6.0 11/15/2012 0749   GLUCOSEU 100*  11/15/2012 0749   HGBUR NEGATIVE 11/15/2012 0749   BILIRUBINUR NEGATIVE 11/15/2012 0749   [place] 11/15/2012 0749   PROTEINUR NEGATIVE 11/15/2012 0749   UROBILINOGEN 0.2 11/15/2012 0749   NITRITE NEGATIVE 11/15/2012 0749   LEUKOCYTESUR NEGATIVE 11/15/2012 0749    RADIOGRAPHIC STUDIES: No results found.  ASSESSMENT:  1. 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 She presented with a 7 cm tumor, extensive LVI. Tumor focally extended to the anterior surface margin, 8 of 8 lymph nodes were involved with metastatic disease. She was ER positive 100%, PR positive 99%, HER2 neu negative, Ki-67 marker 14%. No liver involvement, no lung involvement and she had a mastectomy followed by radiation therapy to T4 through T7 due to severe involvement at this area. She was treated with Arimidex initially from 07/2009 through 03/23/2010 and was progressing by bone scan criteria. We switched her to tamoxifen.  She has not received radiation therapy to the chest wall or axilla. Because of  worsening bone pain, MRI of lumbar spine completed on 11/29/2012 demonstrated progressive diffuse lumbar spine metastatic disease and therefore therapy was changed to Fulvestrant and Exemestane on 12/11/2012, tolerating well except for vasomotor instability. #2. Diabetes mellitus, type II, non-insulin requiring, controlled. #3. Obesity. #4. Vasomotor instability producing insomnia with resultant fatigue.   PLAN:  #1. Increase Effexor XR to 75 mg at bedtime. #2Delton See 120 mg subcutaneously today along with Faslodex 500 mg intramuscularly. #3. Continue exemestane 25 mg daily. #4. Followup in 4 weeks for continuation of treatment and lab tests plus physical exam.   All questions were answered. The patient knows to call the clinic with any problems, questions or concerns. We can certainly see the patient much sooner if necessary.   I spent 25 minutes counseling the  patient face to face. The total time spent in the appointment was 30 minutes.    Doroteo Bradford, MD 03/04/2013 9:44 AM

## 2013-03-04 NOTE — Progress Notes (Signed)
Labs drawn today for cbc/diff,cmp,[id number] 

## 2013-03-04 NOTE — Progress Notes (Signed)
See MD exam notes 

## 2013-03-04 NOTE — Progress Notes (Signed)
Kaylee Mitchell presents today for injection per MD orders. XGEVA administered SQ in left Abdomen. Administration without incident. Patient tolerated well.  Faslodex 500mg  administered IM in left and right upper outer buttock. Administration without incident.  Patient tolerated well.

## 2013-03-04 NOTE — Patient Instructions (Signed)
[facility] Discharge Instructions  RECOMMENDATIONS MADE BY THE CONSULTANT AND ANY TEST RESULTS WILL BE SENT TO YOUR REFERRING PHYSICIAN.  EXAM FINDINGS BY THE PHYSICIAN TODAY AND SIGNS OR SYMPTOMS TO REPORT TO CLINIC OR PRIMARY PHYSICIAN:  You were given Faslodex and XGEVA today.   Please increase your Effexor 37.5mg  (1 tablet) daily to 75mg  (2 tablets) daily. Call us in 1 week to let us know how the hot flashes are.   Return in 4 weeks to see MD and for labs.    Thank you for choosing [facility] to provide your oncology and hematology care.  To afford each patient quality time with our providers, please arrive at least 15 minutes before your scheduled appointment time.  With your help, our goal is to use those 15 minutes to complete the necessary work-up to ensure our physicians have the information they need to help with your evaluation and healthcare recommendations.    Effective January 1st, 2014, we ask that you re-schedule your appointment with our physicians should you arrive 10 or more minutes late for your appointment.  We strive to give you quality time with our providers, and arriving late affects you and other patients whose appointments are after yours.    Again, thank you for choosing [facility] [place].  Our hope is that these requests will decrease the amount of time that you wait before being seen by our physicians.       _____________________________________________________________  Should you have questions after your visit to [facility], please contact our office at [phone] between the hours of 8:30 a.m. and 5:00 p.m.  Voicemails left after 4:30 p.m. will not be returned until the following business day.  For prescription refill requests, have your pharmacy contact our office with your prescription refill request.

## 2013-03-06 ENCOUNTER — Telehealth (HOSPITAL_COMMUNITY): Payer: Self-pay | Admitting: *Deleted

## 2013-03-06 NOTE — Telephone Encounter (Signed)
See telephone log.

## 2013-03-07 ENCOUNTER — Other Ambulatory Visit (HOSPITAL_COMMUNITY): Payer: Self-pay | Admitting: Oncology

## 2013-03-07 ENCOUNTER — Ambulatory Visit (HOSPITAL_COMMUNITY): Payer: Medicare Other | Admitting: Oncology

## 2013-03-07 DIAGNOSIS — C7951 Secondary malignant neoplasm of bone: Secondary | ICD-10-CM

## 2013-03-07 MED ORDER — EXEMESTANE 25 MG PO TABS: 25.0000 mg | ORAL_TABLET | Freq: Every day | ORAL | Status: DC

## 2013-03-26 DIAGNOSIS — E119 Type 2 diabetes mellitus without complications: Secondary | ICD-10-CM | POA: Diagnosis not present

## 2013-03-26 DIAGNOSIS — I10 Essential (primary) hypertension: Secondary | ICD-10-CM | POA: Diagnosis not present

## 2013-03-26 DIAGNOSIS — E669 Obesity, unspecified: Secondary | ICD-10-CM | POA: Diagnosis not present

## 2013-03-27 ENCOUNTER — Telehealth (HOSPITAL_COMMUNITY): Payer: Self-pay | Admitting: Dietician

## 2013-03-27 NOTE — Telephone Encounter (Signed)
Called back at 1111. Call was disconnected. Unable to reach anyone and unable to leave message. Will reattempt.

## 2013-03-27 NOTE — Telephone Encounter (Signed)
Received voicemail left on [phone].

## 2013-03-27 NOTE — Telephone Encounter (Signed)
Called again at 1122. Phone continued to ring. Unable to leave message. Message states "please enter remote access code".

## 2013-04-01 ENCOUNTER — Ambulatory Visit (HOSPITAL_COMMUNITY): Payer: Medicare Other

## 2013-04-01 ENCOUNTER — Encounter (HOSPITAL_COMMUNITY): Payer: Medicare Other

## 2013-04-09 ENCOUNTER — Encounter (HOSPITAL_BASED_OUTPATIENT_CLINIC_OR_DEPARTMENT_OTHER): Payer: Medicare Other

## 2013-04-09 ENCOUNTER — Encounter (HOSPITAL_COMMUNITY): Payer: Medicare Other | Attending: Oncology

## 2013-04-09 DIAGNOSIS — Z5111 Encounter for antineoplastic chemotherapy: Secondary | ICD-10-CM

## 2013-04-09 DIAGNOSIS — C50919 Malignant neoplasm of unspecified site of unspecified female breast: Secondary | ICD-10-CM

## 2013-04-09 DIAGNOSIS — C7952 Secondary malignant neoplasm of bone marrow: Secondary | ICD-10-CM | POA: Diagnosis not present

## 2013-04-09 DIAGNOSIS — C7951 Secondary malignant neoplasm of bone: Secondary | ICD-10-CM | POA: Diagnosis not present

## 2013-04-09 DIAGNOSIS — T148XXA Other injury of unspecified body region, initial encounter: Secondary | ICD-10-CM

## 2013-04-09 DIAGNOSIS — C50219 Malignant neoplasm of upper-inner quadrant of unspecified female breast: Secondary | ICD-10-CM

## 2013-04-09 LAB — CBC WITH DIFFERENTIAL/PLATELET
BASOS PCT: 1 % (ref 0–1)
Basophils Absolute: 0.1 10*3/uL (ref 0.0–0.1)
Eosinophils Absolute: 0.2 10*3/uL (ref 0.0–0.7)
Eosinophils Relative: 4 % (ref 0–5)
HEMATOCRIT: 40.3 % (ref 36.0–46.0)
Hemoglobin: 13.2 g/dL (ref 12.0–15.0)
LYMPHS ABS: 2.4 10*3/uL (ref 0.7–4.0)
LYMPHS PCT: 39 % (ref 12–46)
MCH: 28.9 pg (ref 26.0–34.0)
MCHC: 32.8 g/dL (ref 30.0–36.0)
MCV: 88.4 fL (ref 78.0–100.0)
MONO ABS: 0.4 10*3/uL (ref 0.1–1.0)
MONOS PCT: 7 % (ref 3–12)
NEUTROS PCT: 50 % (ref 43–77)
Neutro Abs: 3.1 10*3/uL (ref 1.7–7.7)
Platelets: 156 10*3/uL (ref 150–400)
RBC: 4.56 MIL/uL (ref 3.87–5.11)
RDW: 14.4 % (ref 11.5–15.5)
WBC: 6.2 10*3/uL (ref 4.0–10.5)

## 2013-04-09 LAB — COMPREHENSIVE METABOLIC PANEL
ALBUMIN: 3.3 g/dL — AB (ref 3.5–5.2)
ALK PHOS: 85 U/L (ref 39–117)
ALT: 20 U/L (ref 0–35)
AST: 22 U/L (ref 0–37)
BILIRUBIN TOTAL: 0.4 mg/dL (ref 0.3–1.2)
BUN: 24 mg/dL — AB (ref 6–23)
CO2: 31 meq/L (ref 19–32)
CREATININE: 1.22 mg/dL — AB (ref 0.50–1.10)
Calcium: 9.7 mg/dL (ref 8.4–10.5)
Chloride: 101 mEq/L (ref 96–112)
GFR calc Af Amer: 55 mL/min — ABNORMAL LOW (ref >90)
GFR, EST NON AFRICAN AMERICAN: 48 mL/min — AB (ref >90)
Glucose, Bld: 188 mg/dL — ABNORMAL HIGH (ref 70–99)
Potassium: 3.9 mEq/L (ref 3.7–5.3)
Sodium: 142 mEq/L (ref 137–147)
Total Protein: 7.7 g/dL (ref 6.0–8.3)

## 2013-04-09 LAB — CANCER ANTIGEN 27.29: CA 27.29: 25 U/mL (ref 0–39)

## 2013-04-09 MED ORDER — OXYCODONE HCL 10 MG PO TABS: 10.0000 mg | ORAL_TABLET | ORAL | Status: DC | PRN

## 2013-04-09 MED ORDER — FULVESTRANT 250 MG/5ML IM SOLN
500.0000 mg | INTRAMUSCULAR | Status: DC
  Administered 2013-04-09: 500 mg via INTRAMUSCULAR
  Filled 2013-04-09: qty 10

## 2013-04-09 MED ORDER — CYCLOBENZAPRINE HCL 10 MG PO TABS: 10.0000 mg | ORAL_TABLET | Freq: Three times a day (TID) | ORAL | Status: DC | PRN

## 2013-04-09 MED ORDER — DENOSUMAB 120 MG/1.7ML ~~LOC~~ SOLN
120.0000 mg | Freq: Once | SUBCUTANEOUS | Status: AC
  Administered 2013-04-09: 120 mg via SUBCUTANEOUS
  Filled 2013-04-09: qty 1.7

## 2013-04-09 NOTE — Progress Notes (Signed)
Labs drawn today for cbc/diff,cmp,ca2729

## 2013-04-09 NOTE — Progress Notes (Signed)
Kaylee Mitchell presents today for injection per MD orders. xgeva 120 mg administered SQ in left Abdomen. Administration without incident. Patient tolerated well. Kaylee Mitchell presents today for injection per MD orders. faslodex 500 mg  administered IM in divided doses left and right buttocks.  Administration without incident. Patient tolerated well.

## 2013-04-09 NOTE — Addendum Note (Signed)
Addended by: Baird Cancer on: 04/09/2013 09:40 AM   Modules accepted: Orders

## 2013-04-09 NOTE — Addendum Note (Signed)
Addended by: Baird Cancer on: 04/09/2013 10:07 AM   Modules accepted: Orders

## 2013-04-29 ENCOUNTER — Other Ambulatory Visit (HOSPITAL_COMMUNITY): Payer: Medicare Other

## 2013-04-29 ENCOUNTER — Ambulatory Visit (HOSPITAL_COMMUNITY): Payer: Medicare Other

## 2013-04-29 DIAGNOSIS — R109 Unspecified abdominal pain: Secondary | ICD-10-CM | POA: Diagnosis not present

## 2013-05-07 ENCOUNTER — Encounter (HOSPITAL_BASED_OUTPATIENT_CLINIC_OR_DEPARTMENT_OTHER): Payer: Medicare Other | Admitting: Oncology

## 2013-05-07 ENCOUNTER — Encounter (HOSPITAL_BASED_OUTPATIENT_CLINIC_OR_DEPARTMENT_OTHER): Payer: Medicare Other

## 2013-05-07 ENCOUNTER — Encounter (HOSPITAL_COMMUNITY): Payer: Medicare Other | Attending: Oncology

## 2013-05-07 ENCOUNTER — Ambulatory Visit (HOSPITAL_COMMUNITY)
Admission: RE | Admit: 2013-05-07 | Discharge: 2013-05-07 | Disposition: A | Discharge status: Home / Self Care | Payer: Medicare Other | Source: Ambulatory Visit | Attending: Oncology | Admitting: Oncology

## 2013-05-07 VITALS — BP 121/70 | HR 113 | Resp 20

## 2013-05-07 DIAGNOSIS — C7951 Secondary malignant neoplasm of bone: Secondary | ICD-10-CM

## 2013-05-07 DIAGNOSIS — C50919 Malignant neoplasm of unspecified site of unspecified female breast: Secondary | ICD-10-CM | POA: Diagnosis not present

## 2013-05-07 DIAGNOSIS — IMO0002 Reserved for concepts with insufficient information to code with codable children: Secondary | ICD-10-CM | POA: Diagnosis not present

## 2013-05-07 DIAGNOSIS — M25569 Pain in unspecified knee: Secondary | ICD-10-CM | POA: Diagnosis not present

## 2013-05-07 DIAGNOSIS — Z5111 Encounter for antineoplastic chemotherapy: Secondary | ICD-10-CM

## 2013-05-07 DIAGNOSIS — K5900 Constipation, unspecified: Secondary | ICD-10-CM | POA: Diagnosis not present

## 2013-05-07 DIAGNOSIS — G893 Neoplasm related pain (acute) (chronic): Secondary | ICD-10-CM

## 2013-05-07 DIAGNOSIS — C7952 Secondary malignant neoplasm of bone marrow: Secondary | ICD-10-CM

## 2013-05-07 DIAGNOSIS — M25562 Pain in left knee: Secondary | ICD-10-CM

## 2013-05-07 DIAGNOSIS — M1710 Unilateral primary osteoarthritis, unspecified knee: Secondary | ICD-10-CM | POA: Diagnosis not present

## 2013-05-07 DIAGNOSIS — C50219 Malignant neoplasm of upper-inner quadrant of unspecified female breast: Secondary | ICD-10-CM

## 2013-05-07 LAB — CBC WITH DIFFERENTIAL/PLATELET
Basophils Absolute: 0 10*3/uL (ref 0.0–0.1)
Basophils Relative: 1 % (ref 0–1)
EOS ABS: 0.2 10*3/uL (ref 0.0–0.7)
EOS PCT: 3 % (ref 0–5)
HCT: 43.1 % (ref 36.0–46.0)
HEMOGLOBIN: 14.1 g/dL (ref 12.0–15.0)
LYMPHS ABS: 2.6 10*3/uL (ref 0.7–4.0)
Lymphocytes Relative: 37 % (ref 12–46)
MCH: 28.9 pg (ref 26.0–34.0)
MCHC: 32.7 g/dL (ref 30.0–36.0)
MCV: 88.3 fL (ref 78.0–100.0)
MONO ABS: 0.4 10*3/uL (ref 0.1–1.0)
MONOS PCT: 6 % (ref 3–12)
NEUTROS PCT: 53 % (ref 43–77)
Neutro Abs: 3.7 10*3/uL (ref 1.7–7.7)
Platelets: 188 10*3/uL (ref 150–400)
RBC: 4.88 MIL/uL (ref 3.87–5.11)
RDW: 13.6 % (ref 11.5–15.5)
WBC: 6.9 10*3/uL (ref 4.0–10.5)

## 2013-05-07 LAB — COMPREHENSIVE METABOLIC PANEL
ALT: 13 U/L (ref 0–35)
AST: 19 U/L (ref 0–37)
Albumin: 3.3 g/dL — ABNORMAL LOW (ref 3.5–5.2)
Alkaline Phosphatase: 90 U/L (ref 39–117)
BUN: 29 mg/dL — AB (ref 6–23)
CALCIUM: 10.1 mg/dL (ref 8.4–10.5)
CO2: 29 mEq/L (ref 19–32)
CREATININE: 1.52 mg/dL — AB (ref 0.50–1.10)
Chloride: 98 mEq/L (ref 96–112)
GFR calc non Af Amer: 36 mL/min — ABNORMAL LOW (ref >90)
GFR, EST AFRICAN AMERICAN: 42 mL/min — AB (ref >90)
GLUCOSE: 122 mg/dL — AB (ref 70–99)
POTASSIUM: 4.4 meq/L (ref 3.7–5.3)
Sodium: 138 mEq/L (ref 137–147)
Total Bilirubin: 0.4 mg/dL (ref 0.3–1.2)
Total Protein: 7.8 g/dL (ref 6.0–8.3)

## 2013-05-07 LAB — CANCER ANTIGEN 27.29: CA 27.29: 20 U/mL (ref 0–39)

## 2013-05-07 MED ORDER — FULVESTRANT 250 MG/5ML IM SOLN
500.0000 mg | INTRAMUSCULAR | Status: DC
  Administered 2013-05-07: 500 mg via INTRAMUSCULAR
  Filled 2013-05-07: qty 10

## 2013-05-07 MED ORDER — DENOSUMAB 120 MG/1.7ML ~~LOC~~ SOLN
120.0000 mg | Freq: Once | SUBCUTANEOUS | Status: AC
  Administered 2013-05-07: 120 mg via SUBCUTANEOUS
  Filled 2013-05-07: qty 1.7

## 2013-05-07 MED ORDER — OXYCODONE HCL 10 MG PO TABS: 10.0000 mg | ORAL_TABLET | ORAL | Status: DC | PRN

## 2013-05-07 NOTE — Progress Notes (Signed)
Kaylee Mitchell presents today for injection per MD orders. Xgeva 120 mg administered SQ in left lower abdomen. Administration without incident. Patient tolerated well.  Kaylee Mitchell presents today for injection per MD orders. Faslodex administered IM in bilateral gluteal sites. Administration without incident. Patient tolerated well.

## 2013-05-07 NOTE — Progress Notes (Signed)
Patient is seen as a work-in today.  She was here for her Faslodex injection.  She has a few complaints: 1. Continue pain that is well controlled with Percocet.  She requested a refill and this was done for her today.  2. Left knee pain.  She reported to the urgent care center for left knee pain.  The pain is inferior to patella and lateral on the medial aspect.  It is tender to palpation.  I will order an Xray of this today. 3. Constipation.  She has taken MiraLax and MOM at home.  She reports that her last BM was on Thursday (5 days ago).  She feels uncomfortable.  I recommended MOM when she get home and repeat 4-6 hours later.  If not BM today, she will call us tomorrow.  We may need to try Lactulose or something.  She reports that she cannot take Mag Citrate because it causes her to vomit.   Patient and plan discussed with Dr. Farrel Gobble and he is in agreement with the aforementioned.    Baird Cancer 05/07/2013

## 2013-05-07 NOTE — Progress Notes (Signed)
Labs drawn today for cbc/diff,cmp,ca2729,

## 2013-05-08 ENCOUNTER — Ambulatory Visit (HOSPITAL_COMMUNITY): Payer: Medicare Other | Admitting: Oncology

## 2013-05-27 ENCOUNTER — Other Ambulatory Visit (HOSPITAL_COMMUNITY): Payer: Medicare Other

## 2013-05-27 ENCOUNTER — Ambulatory Visit (HOSPITAL_COMMUNITY): Payer: Medicare Other

## 2013-05-28 DIAGNOSIS — I10 Essential (primary) hypertension: Secondary | ICD-10-CM | POA: Diagnosis not present

## 2013-05-28 DIAGNOSIS — M25569 Pain in unspecified knee: Secondary | ICD-10-CM | POA: Diagnosis not present

## 2013-05-28 DIAGNOSIS — E119 Type 2 diabetes mellitus without complications: Secondary | ICD-10-CM | POA: Diagnosis not present

## 2013-06-04 ENCOUNTER — Encounter (HOSPITAL_BASED_OUTPATIENT_CLINIC_OR_DEPARTMENT_OTHER): Payer: Medicare Other

## 2013-06-04 ENCOUNTER — Encounter (HOSPITAL_COMMUNITY): Payer: Medicare Other | Attending: Oncology

## 2013-06-04 DIAGNOSIS — C7952 Secondary malignant neoplasm of bone marrow: Secondary | ICD-10-CM

## 2013-06-04 DIAGNOSIS — C7951 Secondary malignant neoplasm of bone: Secondary | ICD-10-CM | POA: Insufficient documentation

## 2013-06-04 DIAGNOSIS — Z5111 Encounter for antineoplastic chemotherapy: Secondary | ICD-10-CM

## 2013-06-04 DIAGNOSIS — C50919 Malignant neoplasm of unspecified site of unspecified female breast: Secondary | ICD-10-CM

## 2013-06-04 DIAGNOSIS — C50219 Malignant neoplasm of upper-inner quadrant of unspecified female breast: Secondary | ICD-10-CM

## 2013-06-04 LAB — CBC WITH DIFFERENTIAL/PLATELET
Basophils Absolute: 0 10*3/uL (ref 0.0–0.1)
Basophils Relative: 1 % (ref 0–1)
Eosinophils Absolute: 0.2 10*3/uL (ref 0.0–0.7)
Eosinophils Relative: 3 % (ref 0–5)
HCT: 39.3 % (ref 36.0–46.0)
Hemoglobin: 12.8 g/dL (ref 12.0–15.0)
Lymphocytes Relative: 36 % (ref 12–46)
Lymphs Abs: 2.1 10*3/uL (ref 0.7–4.0)
MCH: 28.4 pg (ref 26.0–34.0)
MCHC: 32.6 g/dL (ref 30.0–36.0)
MCV: 87.1 fL (ref 78.0–100.0)
Monocytes Absolute: 0.3 10*3/uL (ref 0.1–1.0)
Monocytes Relative: 5 % (ref 3–12)
NEUTROS PCT: 55 % (ref 43–77)
Neutro Abs: 3.2 10*3/uL (ref 1.7–7.7)
PLATELETS: 186 10*3/uL (ref 150–400)
RBC: 4.51 MIL/uL (ref 3.87–5.11)
RDW: 13.9 % (ref 11.5–15.5)
WBC: 5.8 10*3/uL (ref 4.0–10.5)

## 2013-06-04 LAB — COMPREHENSIVE METABOLIC PANEL
ALBUMIN: 3.3 g/dL — AB (ref 3.5–5.2)
ALK PHOS: 88 U/L (ref 39–117)
ALT: 15 U/L (ref 0–35)
AST: 20 U/L (ref 0–37)
BUN: 21 mg/dL (ref 6–23)
CO2: 28 mEq/L (ref 19–32)
Calcium: 9.2 mg/dL (ref 8.4–10.5)
Chloride: 102 mEq/L (ref 96–112)
Creatinine, Ser: 1.11 mg/dL — ABNORMAL HIGH (ref 0.50–1.10)
GFR calc non Af Amer: 53 mL/min — ABNORMAL LOW (ref >90)
GFR, EST AFRICAN AMERICAN: 62 mL/min — AB (ref >90)
GLUCOSE: 262 mg/dL — AB (ref 70–99)
POTASSIUM: 4.2 meq/L (ref 3.7–5.3)
SODIUM: 140 meq/L (ref 137–147)
Total Bilirubin: 0.5 mg/dL (ref 0.3–1.2)
Total Protein: 7.3 g/dL (ref 6.0–8.3)

## 2013-06-04 LAB — CANCER ANTIGEN 27.29: CA 27.29: 21 U/mL (ref 0–39)

## 2013-06-04 MED ORDER — OXYCODONE HCL 10 MG PO TABS: 10.0000 mg | ORAL_TABLET | ORAL | Status: DC | PRN

## 2013-06-04 MED ORDER — DENOSUMAB 120 MG/1.7ML ~~LOC~~ SOLN
120.0000 mg | Freq: Once | SUBCUTANEOUS | Status: AC
  Administered 2013-06-04: 120 mg via SUBCUTANEOUS
  Filled 2013-06-04: qty 1.7

## 2013-06-04 MED ORDER — FULVESTRANT 250 MG/5ML IM SOLN
500.0000 mg | INTRAMUSCULAR | Status: DC
  Administered 2013-06-04: 500 mg via INTRAMUSCULAR
  Filled 2013-06-04: qty 10

## 2013-06-04 NOTE — Progress Notes (Signed)
Kaylee Mitchell presents today for injection per MD orders. Xgeva administered SQ in left lower abdomen. Administration without incident. Patient tolerated well.  Kaylee Mitchell presents today for injection per MD orders. Faslodex 500mg  administered IM in bilateral dorsogluteal sites. Administration without incident. Patient tolerated well.

## 2013-06-04 NOTE — Addendum Note (Signed)
Addended by: Berneta Levins on: 06/04/2013 09:34 AM   Modules accepted: Orders

## 2013-06-06 NOTE — Progress Notes (Signed)
Labs drawn

## 2013-06-16 ENCOUNTER — Other Ambulatory Visit (HOSPITAL_COMMUNITY): Payer: Self-pay | Admitting: Oncology

## 2013-06-16 DIAGNOSIS — C50919 Malignant neoplasm of unspecified site of unspecified female breast: Secondary | ICD-10-CM

## 2013-06-16 DIAGNOSIS — C7951 Secondary malignant neoplasm of bone: Secondary | ICD-10-CM

## 2013-06-16 MED ORDER — EXEMESTANE 25 MG PO TABS: 25.0000 mg | ORAL_TABLET | Freq: Every day | ORAL | Status: DC

## 2013-06-17 ENCOUNTER — Other Ambulatory Visit (HOSPITAL_COMMUNITY): Payer: Self-pay | Admitting: Oncology

## 2013-06-17 DIAGNOSIS — C7951 Secondary malignant neoplasm of bone: Secondary | ICD-10-CM

## 2013-06-17 DIAGNOSIS — C50919 Malignant neoplasm of unspecified site of unspecified female breast: Secondary | ICD-10-CM

## 2013-06-17 MED ORDER — EXEMESTANE 25 MG PO TABS: 25.0000 mg | ORAL_TABLET | Freq: Every day | ORAL | Status: DC

## 2013-06-20 ENCOUNTER — Ambulatory Visit: Payer: Medicare Other | Admitting: Orthopedic Surgery

## 2013-06-20 ENCOUNTER — Other Ambulatory Visit (HOSPITAL_COMMUNITY): Payer: Self-pay | Admitting: Oncology

## 2013-06-20 DIAGNOSIS — C7951 Secondary malignant neoplasm of bone: Secondary | ICD-10-CM

## 2013-06-20 DIAGNOSIS — C50919 Malignant neoplasm of unspecified site of unspecified female breast: Secondary | ICD-10-CM

## 2013-06-20 MED ORDER — IBUPROFEN 800 MG PO TABS: 800.0000 mg | ORAL_TABLET | Freq: Three times a day (TID) | ORAL | Status: DC | PRN

## 2013-07-02 ENCOUNTER — Encounter (HOSPITAL_COMMUNITY): Payer: Self-pay

## 2013-07-02 ENCOUNTER — Encounter (HOSPITAL_BASED_OUTPATIENT_CLINIC_OR_DEPARTMENT_OTHER): Payer: Medicare Other

## 2013-07-02 ENCOUNTER — Encounter (HOSPITAL_COMMUNITY): Payer: Medicare Other | Attending: Oncology

## 2013-07-02 VITALS — BP 128/83 | HR 103 | Temp 98.3°F | Resp 20 | Wt 301.6 lb

## 2013-07-02 DIAGNOSIS — C50219 Malignant neoplasm of upper-inner quadrant of unspecified female breast: Secondary | ICD-10-CM | POA: Diagnosis not present

## 2013-07-02 DIAGNOSIS — C7951 Secondary malignant neoplasm of bone: Secondary | ICD-10-CM

## 2013-07-02 DIAGNOSIS — C7952 Secondary malignant neoplasm of bone marrow: Secondary | ICD-10-CM | POA: Diagnosis not present

## 2013-07-02 DIAGNOSIS — E119 Type 2 diabetes mellitus without complications: Secondary | ICD-10-CM

## 2013-07-02 DIAGNOSIS — Z5111 Encounter for antineoplastic chemotherapy: Secondary | ICD-10-CM | POA: Diagnosis not present

## 2013-07-02 DIAGNOSIS — C50919 Malignant neoplasm of unspecified site of unspecified female breast: Secondary | ICD-10-CM | POA: Insufficient documentation

## 2013-07-02 LAB — COMPREHENSIVE METABOLIC PANEL
ALT: 13 U/L (ref 0–35)
AST: 15 U/L (ref 0–37)
Albumin: 3.3 g/dL — ABNORMAL LOW (ref 3.5–5.2)
Alkaline Phosphatase: 87 U/L (ref 39–117)
BUN: 18 mg/dL (ref 6–23)
CALCIUM: 9.7 mg/dL (ref 8.4–10.5)
CO2: 27 mEq/L (ref 19–32)
Chloride: 102 mEq/L (ref 96–112)
Creatinine, Ser: 1.16 mg/dL — ABNORMAL HIGH (ref 0.50–1.10)
GFR calc non Af Amer: 50 mL/min — ABNORMAL LOW (ref >90)
GFR, EST AFRICAN AMERICAN: 59 mL/min — AB (ref >90)
Glucose, Bld: 178 mg/dL — ABNORMAL HIGH (ref 70–99)
Potassium: 4.1 mEq/L (ref 3.7–5.3)
SODIUM: 140 meq/L (ref 137–147)
TOTAL PROTEIN: 7.6 g/dL (ref 6.0–8.3)
Total Bilirubin: 0.5 mg/dL (ref 0.3–1.2)

## 2013-07-02 LAB — CBC WITH DIFFERENTIAL/PLATELET
BASOS ABS: 0 10*3/uL (ref 0.0–0.1)
Basophils Relative: 0 % (ref 0–1)
EOS ABS: 0.3 10*3/uL (ref 0.0–0.7)
EOS PCT: 4 % (ref 0–5)
HCT: 40.2 % (ref 36.0–46.0)
Hemoglobin: 13.5 g/dL (ref 12.0–15.0)
Lymphocytes Relative: 37 % (ref 12–46)
Lymphs Abs: 2.6 10*3/uL (ref 0.7–4.0)
MCH: 29 pg (ref 26.0–34.0)
MCHC: 33.6 g/dL (ref 30.0–36.0)
MCV: 86.5 fL (ref 78.0–100.0)
Monocytes Absolute: 0.5 10*3/uL (ref 0.1–1.0)
Monocytes Relative: 7 % (ref 3–12)
Neutro Abs: 3.6 10*3/uL (ref 1.7–7.7)
Neutrophils Relative %: 52 % (ref 43–77)
PLATELETS: 171 10*3/uL (ref 150–400)
RBC: 4.65 MIL/uL (ref 3.87–5.11)
RDW: 13.8 % (ref 11.5–15.5)
WBC: 7 10*3/uL (ref 4.0–10.5)

## 2013-07-02 LAB — CANCER ANTIGEN 27.29: CA 27.29: 31 U/mL (ref 0–39)

## 2013-07-02 MED ORDER — OXYCODONE HCL 10 MG PO TABS: 10.0000 mg | ORAL_TABLET | ORAL | Status: DC | PRN

## 2013-07-02 MED ORDER — DENOSUMAB 120 MG/1.7ML ~~LOC~~ SOLN
120.0000 mg | Freq: Once | SUBCUTANEOUS | Status: AC
  Administered 2013-07-02: 120 mg via SUBCUTANEOUS
  Filled 2013-07-02: qty 1.7

## 2013-07-02 MED ORDER — FULVESTRANT 250 MG/5ML IM SOLN
500.0000 mg | INTRAMUSCULAR | Status: DC
  Administered 2013-07-02: 500 mg via INTRAMUSCULAR
  Filled 2013-07-02: qty 10

## 2013-07-02 MED ORDER — VENLAFAXINE HCL ER 150 MG PO CP24: ORAL_CAPSULE | ORAL | Status: DC

## 2013-07-02 NOTE — Progress Notes (Signed)
[id number] - Kaylee Mitchell presents today for injection per MD orders. Xgeva 120 mg administered SQ in left lower abdomen. Administration without incident. Patient tolerated well.  9983 - Kaylee Mitchell presents today for injection per MD orders. Faslodex administered IM in bilateral dorsogluteal sites. Administration without incident. Patient tolerated well.

## 2013-07-02 NOTE — Progress Notes (Signed)
Labs drawn for cbc/diff, cmp, ca27.29.

## 2013-07-02 NOTE — Progress Notes (Signed)
[facility]  OFFICE PROGRESS Jolee Ewing, MD [address] [place] [address]  DIAGNOSIS: Bone metastasis - Plan: Oxycodone HCl 10 MG TABS, venlafaxine XR (EFFEXOR XR) 150 MG 24 hr capsule  Breast CA - Plan: venlafaxine XR (EFFEXOR XR) 150 MG 24 hr capsule  Type II or unspecified type diabetes mellitus without mention of complication, not stated as uncontrolled - Plan: venlafaxine XR (EFFEXOR XR) 150 MG 24 hr capsule  Chief Complaint  Patient presents with  . Breast cancer with bone metastases    CURRENT THERAPY: Faslodex monthly, Xgeva monthly, exemestane 25 mg daily plus Effexor XRT 75 mg for vasomotor instability.  INTERVAL HISTORY: Kaylee Mitchell 58 y.o. 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  Been is control biopsy code on 10 mg every 4 hours during the day sometimes requiring a dose overnight. Bowel movements are regular now since she is taking a regular laxative regimen. Hot flashes are still a problem despite 75 mg of Effexor at bedtime. She denies any cough, sore throat, PND, orthopnea, palpitations, lower extremity swelling or redness, skin rash, headache, or seizures.  MEDICAL HISTORY: Past Medical History  Diagnosis Date  . Hypertension   . High cholesterol   . Knee pain, bilateral 2013  . Cancer     rt breast is primary  . Bone metastasis 09/13/2010  . Diabetes mellitus     type II  . Depression   . Depression 12/14/2011  . Invasive ductal carcinoma of breast 09/13/2010    INTERIM HISTORY: has Invasive ductal carcinoma of breast; Bone metastasis; Medial meniscus, posterior horn derangement; S/P arthroscopy of left knee; Difficulty in walking; Depression; Diabetes mellitus with chronic kidney disease; CKD (chronic  kidney disease); Hyperlipidemia; Essential hypertension, benign; Morbid obesity; Tinea versicolor; Lumbar back pain; and URI, acute on her problem list.   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 She presented with a 7 cm tumor, extensive LVI. Tumor focally extended to the anterior surface margin, 8 of 8 lymph nodes were involved with metastatic disease. She was ER positive 100%, PR positive 99%, HER2 neu negative, Ki-67 marker 14%. No liver involvement, no lung involvement and she had a mastectomy followed by radiation therapy to T4 through T7 due to severe involvement at this area. She was treated with Arimidex initially from 07/2009 through 03/23/2010 and was progressing by bone scan criteria. We switched her to tamoxifen which she is tolerating well and her bone scan is now stable. She has not received radiation therapy to the chest wall or axilla. On 11/15/2012 she complained of back pain. She therefore underwent an MRI of lumbar spine in [place], due to her size and unable to have scan here at [facility]. The full report follows, but the gist of the report demonstrates Progressive diffuse lumbar spine metastatic disease with the most progression at the L1 vertebra. The vertebral body marrow space is almost completely replaced with enhancing metastasis. No pathologic compression fracture. With this indication for progression of disease, therapy was changed to Fulvestrant and Exemestane beginning on 12/11/2012.  Invasive ductal carcinoma of breast  05/26/2009  Initial Diagnosis  Needle core biopsy showed invasive mammary carcinoma with 1 + lymph node  06/03/2009  Surgery  Right modified  radical mastectomy with 8/8 + lymph nodes by Dr. Ronnell Freshwater  07/22/2009  Cancer Staging  Bone scan + for multiple osseous lesions. No disease elsewhere.  07/24/2009 - 03/23/2010  Chemotherapy  Arimidex  03/23/2010  Progression  03/23/2010 - 12/05/2012  Chemotherapy    Tamoxifen  11/28/2012  Progression  Worsening of lumbar spine osseous disease  12/06/2012 -  Chemotherapy  Fulvestrant + Exemestane + Xgeva  ALLERGIES:  has No Known Allergies.  MEDICATIONS: has a current medication list which includes the following prescription(s): aspirin ec, atorvastatin, calcium-vitamin d, carvedilol, cyclobenzaprine, exemestane, ibuprofen, insulin glargine, insulin lispro, lisinopril-hydrochlorothiazide, lorazepam, oxycodone hcl, polyethylene glycol 3350, sertraline, venlafaxine xr, benzonatate, and naproxen, and the following Facility-Administered Medications: 1 mL EPINEPHrine 1 mg/mL (1:1000) in 0.9% Normal Saline 3000 mL irrigation and fulvestrant.  SURGICAL HISTORY:  Past Surgical History  Procedure Laterality Date  . Knee surgery      right-arthroscopy  . Mastectomy      right side  . Abdominal hysterectomy      FAMILY HISTORY: family history includes Arthritis in an other family member; Cancer in her maternal aunt and another family member; Diabetes in her father, mother, and another family member. There is no history of Anesthesia problems, Hypotension, Malignant hyperthermia, or Pseudochol deficiency.  SOCIAL HISTORY:  reports that she has never smoked. She has never used smokeless tobacco. She reports that she does not drink alcohol or use illicit drugs.  REVIEW OF SYSTEMS:  Other than that discussed above is noncontributory.  PHYSICAL EXAMINATION: ECOG PERFORMANCE STATUS: 1 - Symptomatic but completely ambulatory  Blood pressure 128/83, pulse 103, temperature 98.3 F (36.8 C), temperature source Oral, resp. rate 20, weight 301 lb 9.6 oz (136.805 kg).  GENERAL:alert, no distress and comfortable. Morbidly obese. SKIN: skin color, texture, turgor are normal, no rashes or significant lesions EYES: PERLA; Conjunctiva are pink and non-injected, sclera clear SINUSES: No redness or tenderness over maxillary or ethmoid sinuses OROPHARYNX:no exudate, no  erythema on lips, buccal mucosa, or tongue. NECK: supple, thyroid normal size, non-tender, without nodularity. No masses CHEST: Status post right mastectomy with no subcutaneous nodules. LYMPH:  no palpable lymphadenopathy in the cervical, axillary or inguinal LUNGS: clear to auscultation and percussion with normal breathing effort HEART: regular rate & rhythm and no murmurs. ABDOMEN:abdomen soft, non-tender and normal bowel sounds MUSCULOSKELETAL:no cyanosis of digits and no clubbing. Range of motion normal.  NEURO: alert & oriented x 3 with fluent speech, no focal motor/sensory deficits   LABORATORY DATA: Infusion on 07/02/2013  Component Date Value Ref Range Status  . WBC 07/02/2013 7.0  4.0 - 10.5 K/uL Final  . RBC 07/02/2013 4.65  3.87 - 5.11 MIL/uL Final  . Hemoglobin 07/02/2013 13.5  12.0 - 15.0 g/dL Final  . HCT 07/02/2013 40.2  36.0 - 46.0 % Final  . MCV 07/02/2013 86.5  78.0 - 100.0 fL Final  . MCH 07/02/2013 29.0  26.0 - 34.0 pg Final  . MCHC 07/02/2013 33.6  30.0 - 36.0 g/dL Final  . RDW 07/02/2013 13.8  11.5 - 15.5 % Final  . Platelets 07/02/2013 171  150 - 400 K/uL Final  . Neutrophils Relative % 07/02/2013 52  43 - 77 % Final  . Neutro Abs 07/02/2013 3.6  1.7 - 7.7 K/uL Final  . Lymphocytes Relative 07/02/2013 37  12 - 46 % Final  . Lymphs Abs 07/02/2013 2.6  0.7 - 4.0 K/uL Final  . Monocytes Relative 07/02/2013 7  3 - 12 % Final  .  Monocytes Absolute 07/02/2013 0.5  0.1 - 1.0 K/uL Final  . Eosinophils Relative 07/02/2013 4  0 - 5 % Final  . Eosinophils Absolute 07/02/2013 0.3  0.0 - 0.7 K/uL Final  . Basophils Relative 07/02/2013 0  0 - 1 % Final  . Basophils Absolute 07/02/2013 0.0  0.0 - 0.1 K/uL Final  . Sodium 07/02/2013 140  137 - 147 mEq/L Final  . Potassium 07/02/2013 4.1  3.7 - 5.3 mEq/L Final  . Chloride 07/02/2013 102  96 - 112 mEq/L Final  . CO2 07/02/2013 27  19 - 32 mEq/L Final  . Glucose, Bld 07/02/2013 178* 70 - 99 mg/dL Final  . BUN 07/02/2013 18   6 - 23 mg/dL Final  . Creatinine, Ser 07/02/2013 1.16* 0.50 - 1.10 mg/dL Final  . Calcium 07/02/2013 9.7  8.4 - 10.5 mg/dL Final  . Total Protein 07/02/2013 7.6  6.0 - 8.3 g/dL Final  . Albumin 07/02/2013 3.3* 3.5 - 5.2 g/dL Final  . AST 07/02/2013 15  0 - 37 U/L Final  . ALT 07/02/2013 13  0 - 35 U/L Final  . Alkaline Phosphatase 07/02/2013 87  39 - 117 U/L Final  . Total Bilirubin 07/02/2013 0.5  0.3 - 1.2 mg/dL Final  . GFR calc non Af Amer 07/02/2013 50* >90 mL/min Final  . GFR calc Af Amer 07/02/2013 59* >90 mL/min Final   Comment: (NOTE)                          The eGFR has been calculated using the CKD EPI equation.                          This calculation has not been validated in all clinical situations.                          eGFR's persistently <90 mL/min signify possible Chronic Kidney                          Disease.  Infusion on 06/04/2013  Component Date Value Ref Range Status  . WBC 06/04/2013 5.8  4.0 - 10.5 K/uL Final  . RBC 06/04/2013 4.51  3.87 - 5.11 MIL/uL Final  . Hemoglobin 06/04/2013 12.8  12.0 - 15.0 g/dL Final  . HCT 06/04/2013 39.3  36.0 - 46.0 % Final  . MCV 06/04/2013 87.1  78.0 - 100.0 fL Final  . MCH 06/04/2013 28.4  26.0 - 34.0 pg Final  . MCHC 06/04/2013 32.6  30.0 - 36.0 g/dL Final  . RDW 06/04/2013 13.9  11.5 - 15.5 % Final  . Platelets 06/04/2013 186  150 - 400 K/uL Final  . Neutrophils Relative % 06/04/2013 55  43 - 77 % Final  . Neutro Abs 06/04/2013 3.2  1.7 - 7.7 K/uL Final  . Lymphocytes Relative 06/04/2013 36  12 - 46 % Final  . Lymphs Abs 06/04/2013 2.1  0.7 - 4.0 K/uL Final  . Monocytes Relative 06/04/2013 5  3 - 12 % Final  . Monocytes Absolute 06/04/2013 0.3  0.1 - 1.0 K/uL Final  . Eosinophils Relative 06/04/2013 3  0 - 5 % Final  . Eosinophils Absolute 06/04/2013 0.2  0.0 - 0.7 K/uL Final  . Basophils Relative 06/04/2013 1  0 - 1 % Final  . Basophils Absolute 06/04/2013 0.0  0.0 - 0.1  K/uL Final  . Sodium 06/04/2013 140  137 -  147 mEq/L Final  . Potassium 06/04/2013 4.2  3.7 - 5.3 mEq/L Final  . Chloride 06/04/2013 102  96 - 112 mEq/L Final  . CO2 06/04/2013 28  19 - 32 mEq/L Final  . Glucose, Bld 06/04/2013 262* 70 - 99 mg/dL Final  . BUN 06/04/2013 21  6 - 23 mg/dL Final  . Creatinine, Ser 06/04/2013 1.11* 0.50 - 1.10 mg/dL Final  . Calcium 06/04/2013 9.2  8.4 - 10.5 mg/dL Final  . Total Protein 06/04/2013 7.3  6.0 - 8.3 g/dL Final  . Albumin 06/04/2013 3.3* 3.5 - 5.2 g/dL Final  . AST 06/04/2013 20  0 - 37 U/L Final  . ALT 06/04/2013 15  0 - 35 U/L Final  . Alkaline Phosphatase 06/04/2013 88  39 - 117 U/L Final  . Total Bilirubin 06/04/2013 0.5  0.3 - 1.2 mg/dL Final  . GFR calc non Af Amer 06/04/2013 53* >90 mL/min Final  . GFR calc Af Amer 06/04/2013 62* >90 mL/min Final   Comment: (NOTE)                          The eGFR has been calculated using the CKD EPI equation.                          This calculation has not been validated in all clinical situations.                          eGFR's persistently <90 mL/min signify possible Chronic Kidney                          Disease.  Marland Kitchen CA 27.29 06/04/2013 21  0 - 39 U/mL Final   Performed at [facility]: No new pathology.  Urinalysis    Component Value Date/Time   COLORURINE YELLOW 11/15/2012 0749   APPEARANCEUR CLEAR 11/15/2012 0749   LABSPEC >1.030* 11/15/2012 0749   PHURINE 6.0 11/15/2012 0749   GLUCOSEU 100* 11/15/2012 0749   HGBUR NEGATIVE 11/15/2012 0749   BILIRUBINUR NEGATIVE 11/15/2012 0749   [place] 11/15/2012 0749   PROTEINUR NEGATIVE 11/15/2012 0749   UROBILINOGEN 0.2 11/15/2012 0749   NITRITE NEGATIVE 11/15/2012 0749   LEUKOCYTESUR NEGATIVE 11/15/2012 0749    RADIOGRAPHIC STUDIES: No results found.  ASSESSMENT:  1. 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 She presented with a 7 cm tumor, extensive LVI. Tumor focally extended to the anterior surface  margin, 8 of 8 lymph nodes were involved with metastatic disease. She was ER positive 100%, PR positive 99%, HER2 neu negative, Ki-67 marker 14%. No liver involvement, no lung involvement and she had a mastectomy followed by radiation therapy to T4 through T7 due to severe involvement at this area. She was treated with Arimidex initially from 07/2009 through 03/23/2010 and was progressing by bone scan criteria. We switched her to tamoxifen.  She has not received radiation therapy to the chest wall or axilla. Because of worsening bone pain, MRI of lumbar spine completed on 11/29/2012 demonstrated progressive diffuse lumbar spine metastatic disease and therefore therapy was changed to Fulvestrant and Exemestane on 12/11/2012, tolerating well except for vasomotor instability.  #2. Diabetes mellitus, type II, non-insulin requiring, controlled.  #3. Obesity.  #4. Vasomotor  instability producing insomnia with resultant fatigue, not well controlled with venlafaxine a 75 mg at bedtime.      PLAN:  #1. Faslodex 500 mg intramuscularly today and monthly. #2Delton See 120 mg subcutaneous he today and monthly. #3. Exemestane 25 mg daily. #4. Increase the Effexor XR to 150 mg at bedtime. #5. Office visit with CBC, chem profile, CEA, and CA 27-29 in 3 months.   All questions were answered. The patient knows to call the clinic with any problems, questions or concerns. We can certainly see the patient much sooner if necessary.   I spent 25 minutes counseling the patient face to face. The total time spent in the appointment was 30 minutes.    Doroteo Bradford, MD 07/02/2013 10:38 AM  DISCLAIMER:  This note was dictated with voice recognition software.  Similar sounding words can inadvertently be transcribed inaccurately and may not be corrected upon review.

## 2013-07-02 NOTE — Patient Instructions (Signed)
[facility] Discharge Instructions  RECOMMENDATIONS MADE BY THE CONSULTANT AND ANY TEST RESULTS WILL BE SENT TO YOUR REFERRING PHYSICIAN.  EXAM FINDINGS BY THE PHYSICIAN TODAY AND SIGNS OR SYMPTOMS TO REPORT TO CLINIC OR PRIMARY PHYSICIAN: Exam and findings as discussed by Dr. Barnet Glasgow.  Will increase your effexor to 150 mg to help with your hot flashes. Report any new lumps, bone pain, shortness of breath or other symptoms.  MEDICATIONS PRESCRIBED:  Effexor increased to 150 mg at bedtime  INSTRUCTIONS/FOLLOW-UP: Follow-up monthly with labs, faslodex, xjeva and office visit in 3 months.  Thank you for choosing [facility] to provide your oncology and hematology care.  To afford each patient quality time with our providers, please arrive at least 15 minutes before your scheduled appointment time.  With your help, our goal is to use those 15 minutes to complete the necessary work-up to ensure our physicians have the information they need to help with your evaluation and healthcare recommendations.    Effective January 1st, 2014, we ask that you re-schedule your appointment with our physicians should you arrive 10 or more minutes late for your appointment.  We strive to give you quality time with our providers, and arriving late affects you and other patients whose appointments are after yours.    Again, thank you for choosing [facility]  Our hope is that these requests will decrease the amount of time that you wait before being seen by our physicians.       _____________________________________________________________  Should you have questions after your visit to [facility], please contact our office at [phone] between the hours of 8:30 a.m. and 5:00 p.m.  Voicemails left after 4:30 p.m. will not be returned until the following business day.  For prescription refill requests, have your pharmacy contact our office with your  prescription refill request.

## 2013-07-09 DIAGNOSIS — L851 Acquired keratosis [keratoderma] palmaris et plantaris: Secondary | ICD-10-CM | POA: Diagnosis not present

## 2013-07-09 DIAGNOSIS — E1159 Type 2 diabetes mellitus with other circulatory complications: Secondary | ICD-10-CM | POA: Diagnosis not present

## 2013-07-09 DIAGNOSIS — B351 Tinea unguium: Secondary | ICD-10-CM | POA: Diagnosis not present

## 2013-07-16 ENCOUNTER — Ambulatory Visit (INDEPENDENT_AMBULATORY_CARE_PROVIDER_SITE_OTHER): Payer: Medicare Other | Admitting: Orthopedic Surgery

## 2013-07-16 ENCOUNTER — Encounter: Payer: Self-pay | Admitting: Orthopedic Surgery

## 2013-07-16 VITALS — BP 145/80 | Ht 65.0 in | Wt 298.0 lb

## 2013-07-16 DIAGNOSIS — M1710 Unilateral primary osteoarthritis, unspecified knee: Secondary | ICD-10-CM | POA: Diagnosis not present

## 2013-07-16 DIAGNOSIS — IMO0002 Reserved for concepts with insufficient information to code with codable children: Secondary | ICD-10-CM | POA: Diagnosis not present

## 2013-07-16 DIAGNOSIS — M7050 Other bursitis of knee, unspecified knee: Secondary | ICD-10-CM

## 2013-07-16 DIAGNOSIS — M1712 Unilateral primary osteoarthritis, left knee: Secondary | ICD-10-CM

## 2013-07-16 NOTE — Progress Notes (Signed)
Patient ID: Kaylee Mitchell, female   DOB: May 13, 1955, 58 y.o.   MRN: [id number]  Chief Complaint  Patient presents with  . Follow-up    Left knee pain reoccuring. Referred by DR. 73    58 year old female status post left knee arthroscopy a few years ago presents with medial left knee pain and radicular symptoms in her right leg. She has a history breast cancer with metastatic bone disease. Had an MRI in 2014 which showed bone metastasis in the lumbar spine with bilateral foraminal stenosis at L4-5.  She complains of giving way symptoms of her left knee as well.  Review of systems revealed no abnormalities in terms of unexpected weight loss recently., Fatigue chills fever. Shortness of breath. Heartburn nausea vomiting frequency urgency. Numbness and tingling right leg unsteady gait left leg.  Vital signs: BP 145/80  Ht 5\' 5"  (1.651 m)  Wt 298 lb (135.172 kg)  BMI 49.59 kg/m2   General the patient is well-developed and well-nourished grooming and hygiene are normal Oriented x3 Mood and affect normal Ambulation normal today  Inspection of the left knee: medial joint line tenderness with medial pes bursa tenderness, no effusion is present. Full range of motion All joints are stable Motor exam is normal Skin clean dry and intact  Cardiovascular exam is normal Sensory exam normal  xrays show minimal degen change no bone lesions   MRI L spine :   CLINICAL DATA:  Low back pain and right flank pain radiating to the right foot. Numbness for 6 months. History of breast cancer.   EXAM: MRI LUMBAR SPINE WITHOUT AND WITH CONTRAST   TECHNIQUE: Multiplanar and multiecho pulse sequences of the lumbar spine were obtained without and with intravenous contrast.   CONTRAST:  39mL MULTIHANCE GADOBENATE DIMEGLUMINE 529 MG/ML IV SOLN   COMPARISON:  CT 11/15/2012. Bone scan 11/19/2012. MRI of lumbar spine 06/26/2012.   FINDINGS: Numbering used on prior exam preserved. Spinal cord  terminates posterior to the L1-L2 interspace. Vertebral body height is preserved. There are no pathologic compression fractures. Diffuse metastatic infiltration of the lumbar spine is present. The most avid post gadolinium enhancement is in the L1 vertebra. Metastatic infiltration of the sacrum and both iliac bones is present. No definite insufficiency fracture identified. Paraspinal soft tissues are within normal limits and unchanged. There is a mild S-shaped curvature of the lumbar spine. Intervertebral disc spaces from T11-T12 through L2-L3 appear normal aside from mild disc desiccation. No stenosis.   L4-L5: Disc desiccation and degeneration with shallow bulging. There is mild bilateral foraminal stenosis associated with bulging disc. Central canal and lateral recesses are patent. Facet joints appear normal.   L5-S1: Preserved disc height. Central canal and lateral recesses patent. Foramina patent.   When comparing of the pre and post-contrast imaging to the prior examination of 06/26/2012, the infiltration of the L1 vertebra appears more pronounced and now occupies almost the entire vertebral body marrow space. There is no extraosseous extension of tumor. Additionally, there is increasing prominence of metastatic disease in several vertebral bodies. This is particularly true at L5 where a new metastatic lesion can be seen in the right pedicle. The sclerotic metastatic lesions appear little changed. There is also new T11 involvement. Iliac bone and sacral lesions grossly appear unchanged.   IMPRESSION: 1. Progressive diffuse lumbar spine metastatic disease with the most progression at the L1 vertebra. The vertebral body marrow space is almost completely replaced with enhancing metastasis. No pathologic compression fracture. 2. No interval change in  mild L4-L5 spondylosis.     Electronically Signed   By: Dereck Ligas M.D.   On: 11/28/2012 13:43   Knee  Injection  Procedure Note  Pre-operative Diagnosis: left knee oa  Post-operative Diagnosis: same  Indications: pain  Anesthesia: ethyl chloride   Procedure Details   Verbal consent was obtained for the procedure. Time out was completed.The joint was prepped with alcohol, followed by  Ethyl chloride spray and A 20 gauge needle was inserted into the knee via lateral approach; 42ml 1% lidocaine and 1 ml of depomedrol  was then injected into the joint . The needle was removed and the area cleansed and dressed.  Complications:  None; patient tolerated the procedure well.  Refer to neurosugrery

## 2013-07-16 NOTE — Patient Instructions (Addendum)
Referral to neurosurgery for re evaluation of spinal metastasis   Injection left knee

## 2013-07-17 ENCOUNTER — Telehealth: Payer: Self-pay | Admitting: *Deleted

## 2013-07-17 DIAGNOSIS — C7951 Secondary malignant neoplasm of bone: Secondary | ICD-10-CM

## 2013-07-17 NOTE — Telephone Encounter (Signed)
Referral sent to [facility].  

## 2013-07-26 NOTE — Telephone Encounter (Signed)
PATIENT HAS APPOINTMENT AT [facility] 08/01/13 @ 11:30 WITH DR [id number]. PATIENT IS AWARE

## 2013-07-30 ENCOUNTER — Encounter (HOSPITAL_COMMUNITY): Payer: Medicare Other | Attending: Oncology

## 2013-07-30 ENCOUNTER — Encounter (HOSPITAL_BASED_OUTPATIENT_CLINIC_OR_DEPARTMENT_OTHER): Payer: Medicare Other

## 2013-07-30 ENCOUNTER — Ambulatory Visit (HOSPITAL_COMMUNITY): Payer: Medicare Other

## 2013-07-30 VITALS — BP 160/97 | HR 97 | Temp 98.3°F | Resp 20

## 2013-07-30 DIAGNOSIS — Z5111 Encounter for antineoplastic chemotherapy: Secondary | ICD-10-CM | POA: Diagnosis not present

## 2013-07-30 DIAGNOSIS — C50919 Malignant neoplasm of unspecified site of unspecified female breast: Secondary | ICD-10-CM

## 2013-07-30 DIAGNOSIS — C50219 Malignant neoplasm of upper-inner quadrant of unspecified female breast: Secondary | ICD-10-CM | POA: Diagnosis not present

## 2013-07-30 DIAGNOSIS — C7952 Secondary malignant neoplasm of bone marrow: Principal | ICD-10-CM

## 2013-07-30 DIAGNOSIS — C7951 Secondary malignant neoplasm of bone: Secondary | ICD-10-CM

## 2013-07-30 LAB — CBC WITH DIFFERENTIAL/PLATELET
Basophils Absolute: 0 10*3/uL (ref 0.0–0.1)
Basophils Relative: 1 % (ref 0–1)
EOS ABS: 0.2 10*3/uL (ref 0.0–0.7)
EOS PCT: 3 % (ref 0–5)
HCT: 40.7 % (ref 36.0–46.0)
HEMOGLOBIN: 13.5 g/dL (ref 12.0–15.0)
LYMPHS ABS: 2.7 10*3/uL (ref 0.7–4.0)
LYMPHS PCT: 42 % (ref 12–46)
MCH: 28.9 pg (ref 26.0–34.0)
MCHC: 33.2 g/dL (ref 30.0–36.0)
MCV: 87.2 fL (ref 78.0–100.0)
Monocytes Absolute: 0.4 10*3/uL (ref 0.1–1.0)
Monocytes Relative: 7 % (ref 3–12)
Neutro Abs: 3 10*3/uL (ref 1.7–7.7)
Neutrophils Relative %: 47 % (ref 43–77)
PLATELETS: 164 10*3/uL (ref 150–400)
RBC: 4.67 MIL/uL (ref 3.87–5.11)
RDW: 13.7 % (ref 11.5–15.5)
WBC: 6.3 10*3/uL (ref 4.0–10.5)

## 2013-07-30 LAB — COMPREHENSIVE METABOLIC PANEL
ALK PHOS: 105 U/L (ref 39–117)
ALT: 12 U/L (ref 0–35)
ANION GAP: 10 (ref 5–15)
AST: 15 U/L (ref 0–37)
Albumin: 3.3 g/dL — ABNORMAL LOW (ref 3.5–5.2)
BUN: 18 mg/dL (ref 6–23)
CO2: 27 meq/L (ref 19–32)
Calcium: 9.5 mg/dL (ref 8.4–10.5)
Chloride: 102 mEq/L (ref 96–112)
Creatinine, Ser: 1.16 mg/dL — ABNORMAL HIGH (ref 0.50–1.10)
GFR calc Af Amer: 59 mL/min — ABNORMAL LOW (ref >90)
GFR, EST NON AFRICAN AMERICAN: 50 mL/min — AB (ref >90)
Glucose, Bld: 262 mg/dL — ABNORMAL HIGH (ref 70–99)
POTASSIUM: 4.2 meq/L (ref 3.7–5.3)
SODIUM: 139 meq/L (ref 137–147)
Total Bilirubin: 0.4 mg/dL (ref 0.3–1.2)
Total Protein: 7.4 g/dL (ref 6.0–8.3)

## 2013-07-30 MED ORDER — FULVESTRANT 250 MG/5ML IM SOLN
500.0000 mg | INTRAMUSCULAR | Status: DC
  Administered 2013-07-30: 500 mg via INTRAMUSCULAR
  Filled 2013-07-30: qty 10

## 2013-07-30 MED ORDER — DENOSUMAB 120 MG/1.7ML ~~LOC~~ SOLN
120.0000 mg | Freq: Once | SUBCUTANEOUS | Status: AC
  Administered 2013-07-30: 120 mg via SUBCUTANEOUS
  Filled 2013-07-30: qty 1.7

## 2013-07-30 MED ORDER — OXYCODONE HCL 10 MG PO TABS: 10.0000 mg | ORAL_TABLET | ORAL | Status: DC | PRN

## 2013-07-30 NOTE — Progress Notes (Signed)
LABS DRAWN FOR CBCD,CMP,[id number]

## 2013-07-30 NOTE — Progress Notes (Signed)
Kaylee Mitchell presents today for injection per MD orders. Xgeva 120 mg  administered SQ in left Abdomen. Administration without incident. Patient tolerated well. Kaylee Mitchell presents today for injection per MD orders. Faslodex 2 syringes each of 250 mg administered IM in both left and right upper outer Gluteals. Administration without incident. Patient tolerated well.

## 2013-07-31 LAB — CANCER ANTIGEN 27.29: CA 27.29: 24 U/mL (ref 0–39)

## 2013-08-01 DIAGNOSIS — M25569 Pain in unspecified knee: Secondary | ICD-10-CM | POA: Diagnosis not present

## 2013-08-01 DIAGNOSIS — Z6841 Body Mass Index (BMI) 40.0 and over, adult: Secondary | ICD-10-CM | POA: Diagnosis not present

## 2013-08-01 DIAGNOSIS — M79609 Pain in unspecified limb: Secondary | ICD-10-CM | POA: Diagnosis not present

## 2013-08-16 DIAGNOSIS — I10 Essential (primary) hypertension: Secondary | ICD-10-CM | POA: Diagnosis not present

## 2013-08-16 DIAGNOSIS — E119 Type 2 diabetes mellitus without complications: Secondary | ICD-10-CM | POA: Diagnosis not present

## 2013-08-16 DIAGNOSIS — E669 Obesity, unspecified: Secondary | ICD-10-CM | POA: Diagnosis not present

## 2013-08-16 DIAGNOSIS — E7800 Pure hypercholesterolemia, unspecified: Secondary | ICD-10-CM | POA: Diagnosis not present

## 2013-08-19 ENCOUNTER — Other Ambulatory Visit: Payer: Self-pay | Admitting: Family Medicine

## 2013-08-19 NOTE — Telephone Encounter (Signed)
Medication filled x1 with no refills.   Requires office visit before any further refills can be given.   Letter sent.  

## 2013-08-27 ENCOUNTER — Encounter (HOSPITAL_COMMUNITY): Payer: Medicare Other | Attending: Oncology

## 2013-08-27 ENCOUNTER — Encounter (HOSPITAL_BASED_OUTPATIENT_CLINIC_OR_DEPARTMENT_OTHER): Payer: Medicare Other

## 2013-08-27 ENCOUNTER — Other Ambulatory Visit (HOSPITAL_COMMUNITY): Payer: Self-pay | Admitting: Hematology and Oncology

## 2013-08-27 ENCOUNTER — Ambulatory Visit (HOSPITAL_COMMUNITY): Payer: Medicare Other

## 2013-08-27 VITALS — BP 144/57 | HR 103 | Resp 20

## 2013-08-27 DIAGNOSIS — C50219 Malignant neoplasm of upper-inner quadrant of unspecified female breast: Secondary | ICD-10-CM

## 2013-08-27 DIAGNOSIS — C7951 Secondary malignant neoplasm of bone: Secondary | ICD-10-CM

## 2013-08-27 DIAGNOSIS — Z5111 Encounter for antineoplastic chemotherapy: Secondary | ICD-10-CM | POA: Diagnosis not present

## 2013-08-27 DIAGNOSIS — C7952 Secondary malignant neoplasm of bone marrow: Secondary | ICD-10-CM

## 2013-08-27 DIAGNOSIS — C50919 Malignant neoplasm of unspecified site of unspecified female breast: Secondary | ICD-10-CM | POA: Insufficient documentation

## 2013-08-27 LAB — COMPREHENSIVE METABOLIC PANEL
ALBUMIN: 3.4 g/dL — AB (ref 3.5–5.2)
ALT: 15 U/L (ref 0–35)
ANION GAP: 11 (ref 5–15)
AST: 15 U/L (ref 0–37)
Alkaline Phosphatase: 84 U/L (ref 39–117)
BILIRUBIN TOTAL: 0.4 mg/dL (ref 0.3–1.2)
BUN: 22 mg/dL (ref 6–23)
CO2: 26 meq/L (ref 19–32)
CREATININE: 1.15 mg/dL — AB (ref 0.50–1.10)
Calcium: 9.3 mg/dL (ref 8.4–10.5)
Chloride: 104 mEq/L (ref 96–112)
GFR calc Af Amer: 59 mL/min — ABNORMAL LOW (ref >90)
GFR, EST NON AFRICAN AMERICAN: 51 mL/min — AB (ref >90)
Glucose, Bld: 170 mg/dL — ABNORMAL HIGH (ref 70–99)
Potassium: 3.9 mEq/L (ref 3.7–5.3)
Sodium: 141 mEq/L (ref 137–147)
Total Protein: 7.3 g/dL (ref 6.0–8.3)

## 2013-08-27 LAB — CBC WITH DIFFERENTIAL/PLATELET
BASOS PCT: 0 % (ref 0–1)
Basophils Absolute: 0 10*3/uL (ref 0.0–0.1)
EOS PCT: 3 % (ref 0–5)
Eosinophils Absolute: 0.2 10*3/uL (ref 0.0–0.7)
HEMATOCRIT: 40.8 % (ref 36.0–46.0)
Hemoglobin: 13.4 g/dL (ref 12.0–15.0)
Lymphocytes Relative: 39 % (ref 12–46)
Lymphs Abs: 2.3 10*3/uL (ref 0.7–4.0)
MCH: 28.7 pg (ref 26.0–34.0)
MCHC: 32.8 g/dL (ref 30.0–36.0)
MCV: 87.4 fL (ref 78.0–100.0)
MONO ABS: 0.4 10*3/uL (ref 0.1–1.0)
MONOS PCT: 6 % (ref 3–12)
NEUTROS ABS: 3.1 10*3/uL (ref 1.7–7.7)
Neutrophils Relative %: 52 % (ref 43–77)
Platelets: 156 10*3/uL (ref 150–400)
RBC: 4.67 MIL/uL (ref 3.87–5.11)
RDW: 14.1 % (ref 11.5–15.5)
WBC: 6 10*3/uL (ref 4.0–10.5)

## 2013-08-27 LAB — CANCER ANTIGEN 27.29: CA 27.29: 26 U/mL (ref 0–39)

## 2013-08-27 MED ORDER — FULVESTRANT 250 MG/5ML IM SOLN
500.0000 mg | INTRAMUSCULAR | Status: DC
  Administered 2013-08-27: 500 mg via INTRAMUSCULAR
  Filled 2013-08-27: qty 10

## 2013-08-27 MED ORDER — DENOSUMAB 120 MG/1.7ML ~~LOC~~ SOLN
120.0000 mg | Freq: Once | SUBCUTANEOUS | Status: AC
  Administered 2013-08-27: 120 mg via SUBCUTANEOUS
  Filled 2013-08-27: qty 1.7

## 2013-08-27 MED ORDER — OXYCODONE HCL 10 MG PO TABS: 10.0000 mg | ORAL_TABLET | ORAL | Status: DC | PRN

## 2013-08-27 NOTE — Progress Notes (Signed)
LABS DRAWN FOR CBCD,CMP,[id number]

## 2013-08-27 NOTE — Progress Notes (Signed)
Kaylee Mitchell presents today for injection per the provider's orders.  Xgeva and Faslodex administration without incident; see MAR for injection details.  Patient tolerated procedure well and without incident.  No questions or complaints noted at this time. 

## 2013-09-24 ENCOUNTER — Encounter (HOSPITAL_BASED_OUTPATIENT_CLINIC_OR_DEPARTMENT_OTHER): Payer: Medicare Other

## 2013-09-24 ENCOUNTER — Encounter (HOSPITAL_COMMUNITY): Payer: Self-pay

## 2013-09-24 ENCOUNTER — Encounter (HOSPITAL_COMMUNITY): Payer: Medicare Other | Attending: Oncology

## 2013-09-24 VITALS — BP 137/86 | HR 99 | Temp 98.7°F | Resp 18 | Wt 300.0 lb

## 2013-09-24 DIAGNOSIS — C7952 Secondary malignant neoplasm of bone marrow: Secondary | ICD-10-CM

## 2013-09-24 DIAGNOSIS — C50911 Malignant neoplasm of unspecified site of right female breast: Secondary | ICD-10-CM

## 2013-09-24 DIAGNOSIS — C50919 Malignant neoplasm of unspecified site of unspecified female breast: Secondary | ICD-10-CM | POA: Diagnosis not present

## 2013-09-24 DIAGNOSIS — C7951 Secondary malignant neoplasm of bone: Secondary | ICD-10-CM | POA: Insufficient documentation

## 2013-09-24 DIAGNOSIS — C50219 Malignant neoplasm of upper-inner quadrant of unspecified female breast: Secondary | ICD-10-CM

## 2013-09-24 DIAGNOSIS — Z5111 Encounter for antineoplastic chemotherapy: Secondary | ICD-10-CM | POA: Diagnosis not present

## 2013-09-24 LAB — CANCER ANTIGEN 27.29: CA 27.29: 26 U/mL (ref 0–39)

## 2013-09-24 LAB — COMPREHENSIVE METABOLIC PANEL
ALBUMIN: 3.5 g/dL (ref 3.5–5.2)
ALT: 14 U/L (ref 0–35)
AST: 19 U/L (ref 0–37)
Alkaline Phosphatase: 94 U/L (ref 39–117)
Anion gap: 10 (ref 5–15)
BUN: 18 mg/dL (ref 6–23)
CO2: 27 mEq/L (ref 19–32)
Calcium: 9.8 mg/dL (ref 8.4–10.5)
Chloride: 103 mEq/L (ref 96–112)
Creatinine, Ser: 1.22 mg/dL — ABNORMAL HIGH (ref 0.50–1.10)
GFR calc non Af Amer: 48 mL/min — ABNORMAL LOW (ref >90)
GFR, EST AFRICAN AMERICAN: 55 mL/min — AB (ref >90)
Glucose, Bld: 242 mg/dL — ABNORMAL HIGH (ref 70–99)
POTASSIUM: 4.1 meq/L (ref 3.7–5.3)
Sodium: 140 mEq/L (ref 137–147)
TOTAL PROTEIN: 7.4 g/dL (ref 6.0–8.3)
Total Bilirubin: 0.4 mg/dL (ref 0.3–1.2)

## 2013-09-24 LAB — CBC WITH DIFFERENTIAL/PLATELET
BASOS PCT: 1 % (ref 0–1)
Basophils Absolute: 0 10*3/uL (ref 0.0–0.1)
EOS ABS: 0.2 10*3/uL (ref 0.0–0.7)
EOS PCT: 4 % (ref 0–5)
HCT: 40.6 % (ref 36.0–46.0)
Hemoglobin: 13.6 g/dL (ref 12.0–15.0)
LYMPHS ABS: 2.4 10*3/uL (ref 0.7–4.0)
Lymphocytes Relative: 42 % (ref 12–46)
MCH: 29.1 pg (ref 26.0–34.0)
MCHC: 33.5 g/dL (ref 30.0–36.0)
MCV: 86.8 fL (ref 78.0–100.0)
MONOS PCT: 9 % (ref 3–12)
Monocytes Absolute: 0.5 10*3/uL (ref 0.1–1.0)
NEUTROS PCT: 44 % (ref 43–77)
Neutro Abs: 2.6 10*3/uL (ref 1.7–7.7)
Platelets: 172 10*3/uL (ref 150–400)
RBC: 4.68 MIL/uL (ref 3.87–5.11)
RDW: 13.9 % (ref 11.5–15.5)
WBC: 5.7 10*3/uL (ref 4.0–10.5)

## 2013-09-24 MED ORDER — OXYCODONE HCL 10 MG PO TABS: 10.0000 mg | ORAL_TABLET | ORAL | Status: DC | PRN

## 2013-09-24 MED ORDER — FULVESTRANT 250 MG/5ML IM SOLN
500.0000 mg | INTRAMUSCULAR | Status: DC
  Administered 2013-09-24: 500 mg via INTRAMUSCULAR
  Filled 2013-09-24: qty 10

## 2013-09-24 MED ORDER — DENOSUMAB 120 MG/1.7ML ~~LOC~~ SOLN
120.0000 mg | Freq: Once | SUBCUTANEOUS | Status: AC
  Administered 2013-09-24: 120 mg via SUBCUTANEOUS
  Filled 2013-09-24: qty 1.7

## 2013-09-24 NOTE — Patient Instructions (Signed)
[facility] Discharge Instructions  RECOMMENDATIONS MADE BY THE CONSULTANT AND ANY TEST RESULTS WILL BE SENT TO YOUR REFERRING PHYSICIAN.  EXAM FINDINGS BY THE PHYSICIAN TODAY AND SIGNS OR SYMPTOMS TO REPORT TO CLINIC OR PRIMARY PHYSICIAN: Exam and findings as discussed by Dr. Barnet Glasgow.  MEDICATIONS PRESCRIBED:  Oxycodone 10mg  every 4 hours as needed for pain  INSTRUCTIONS/FOLLOW-UP: Return monthly for lab work (BMP) and injections. Office visit in 3 months with labs - CBC, CMP, CEA, and CA 27.29  Thank you for choosing [facility] to provide your oncology and hematology care.  To afford each patient quality time with our providers, please arrive at least 15 minutes before your scheduled appointment time.  With your help, our goal is to use those 15 minutes to complete the necessary work-up to ensure our physicians have the information they need to help with your evaluation and healthcare recommendations.    Effective January 1st, 2014, we ask that you re-schedule your appointment with our physicians should you arrive 10 or more minutes late for your appointment.  We strive to give you quality time with our providers, and arriving late affects you and other patients whose appointments are after yours.    Again, thank you for choosing [facility].  Our hope is that these requests will decrease the amount of time that you wait before being seen by our physicians.       _____________________________________________________________  Should you have questions after your visit to [facility], please contact our office at [phone] between the hours of 8:30 a.m. and 4:30 p.m.  Voicemails left after 4:30 p.m. will not be returned until the following business day.  For prescription refill requests, have your pharmacy contact our office with your prescription refill request.     _______________________________________________________________  We hope that we have given you very good care.  You may receive a patient satisfaction survey in the mail, please complete it and return it as soon as possible.  We value your feedback!  _______________________________________________________________  Have you asked about our STAR program?  STAR stands for Survivorship Training and Rehabilitation, and this is a nationally recognized cancer care program that focuses on survivorship and rehabilitation.  Cancer and cancer treatments may cause problems, such as, pain, making you feel tired and keeping you from doing the things that you need or want to do. Cancer rehabilitation can help. Our goal is to reduce these troubling effects and help you have the best quality of life possible.  You may receive a survey from a nurse that asks questions about your current state of health.  Based on the survey results, all eligible patients will be referred to the [facility] program for an evaluation so we can better serve you!  A frequently asked questions sheet is available upon request.

## 2013-09-24 NOTE — Progress Notes (Signed)
..  Kaylee Mitchell presents today for injections per the provider's orders.  Faslodex and Xgeva administrations without incident; see MAR for injection details.  Patient tolerated procedure well and without incident.  No questions or complaints noted at this time.

## 2013-09-24 NOTE — Progress Notes (Signed)
Labs for cbcd,cmp,[id number]

## 2013-09-24 NOTE — Progress Notes (Signed)
[facility]  OFFICE PROGRESS Jolee Ewing, MD [address] [place] [address]  DIAGNOSIS: Bone metastasis - Plan: SCHEDULING COMMUNICATION, denosumab (XGEVA) injection 120 mg, fulvestrant (FASLODEX) injection 500 mg, Oxycodone HCl 10 MG TABS  Breast CA, right  Chief Complaint  Patient presents with  . Metastatic breast cancer    CURRENT THERAPY: Faslodex and Xgeva monthly, exemestane 25 mg daily with Effexor XL 150 mg daily for vasomotor instability.  INTERVAL HISTORY: Kaylee Mitchell 58 y.o. female returns for followup and continuation of treatment for stage IV breast cancer with bone metastases while taking exemestane 25 mg daily, Xgeva monthly and Faslodex monthly with Effexor XR 169m daily for vasomotor instability. Hot flashes have improved on higher dose Effexor. Appetite is good with no nausea, vomiting, PND, orthopnea, palpitations, but with decreased energy over the past 2 weeks, having problems falling asleep. This is because of knee pain. She denies a diarrhea, constipation, melena, hematochezia, hematuria, lower extremity swelling or redness, incontinence, but with left knee pain without redness or swelling.  MEDICAL HISTORY: Past Medical History  Diagnosis Date  . Hypertension   . High cholesterol   . Knee pain, bilateral 2013  . Cancer     rt breast is primary  . Bone metastasis 09/13/2010  . Diabetes mellitus     type II  . Depression   . Depression 12/14/2011  . Invasive ductal carcinoma of breast 09/13/2010    INTERIM HISTORY: has Invasive ductal carcinoma of breast; Bone metastasis; Medial meniscus, posterior horn derangement; S/P arthroscopy of left knee; Difficulty in walking; Depression; Diabetes mellitus with chronic kidney disease; CKD (chronic kidney disease); Hyperlipidemia; Essential hypertension, benign; Morbid obesity; Tinea versicolor; Lumbar back pain; and URI, acute on her problem list.     ALLERGIES:  has No Known Allergies.  MEDICATIONS: has a current medication list which includes the following prescription(s): aspirin ec, atorvastatin, calcium-vitamin d, carvedilol, cyclobenzaprine, docusate sodium, exemestane, ibuprofen, insulin glargine, insulin lispro, lisinopril-hydrochlorothiazide, lorazepam, oxycodone hcl, sertraline, venlafaxine xr, naproxen, and polyethylene glycol 3350, and the following Facility-Administered Medications: 1 mL EPINEPHrine 1 mg/mL (1:1000) in 0.9% Normal Saline 3000 mL irrigation and fulvestrant.  SURGICAL HISTORY:  Past Surgical History  Procedure Laterality Date  . Knee surgery      right-arthroscopy  . Mastectomy      right side  . Abdominal hysterectomy      FAMILY HISTORY: family history includes Arthritis in an other family member; Cancer in her maternal aunt and another family member; Diabetes in her father, mother, and another family member. There is no history of Anesthesia problems, Hypotension, Malignant hyperthermia, or Pseudochol deficiency.  SOCIAL HISTORY:  reports that she has never smoked. She has never used smokeless tobacco. She reports that she does not drink alcohol or use illicit drugs.  REVIEW OF SYSTEMS:  Other than that discussed above is noncontributory.  PHYSICAL EXAMINATION: ECOG PERFORMANCE STATUS: 1 - Symptomatic but completely ambulatory  Blood pressure 137/86, pulse 99, temperature 98.7 F (37.1 C), temperature source Oral, resp. rate 18, weight 300 lb (136.079 kg), SpO2 99.00%.  GENERAL:alert, no distress and comfortable. Morbidly obese. SKIN: skin color, texture, turgor are normal, no rashes or significant lesions EYES: PERLA; Conjunctiva are pink and non-injected, sclera clear SINUSES: No redness or tenderness over maxillary or ethmoid sinuses OROPHARYNX:no exudate, no erythema on lips, buccal mucosa, or tongue. NECK: supple, thyroid normal size, non-tender, without nodularity. No masses CHEST:  status  post right mastectomy with no subcutaneous nodules. Left breast pendulous with no masses appreciated. LYMPH:  no palpable lymphadenopathy in the cervical, axillary or inguinal LUNGS: clear to auscultation and percussion with normal breathing effort HEART: regular rate & rhythm and no murmurs. ABDOMEN:abdomen soft, non-tender and normal bowel sounds MUSCULOSKELETAL:no cyanosis of digits and no clubbing. Range of motion normal.  NEURO: alert & oriented x 3 with fluent speech, no focal motor/sensory deficits   LABORATORY DATA: Lab on 09/24/2013  Component Date Value Ref Range Status  . WBC 09/24/2013 5.7  4.0 - 10.5 K/uL Final  . RBC 09/24/2013 4.68  3.87 - 5.11 MIL/uL Final  . Hemoglobin 09/24/2013 13.6  12.0 - 15.0 g/dL Final  . HCT 09/24/2013 40.6  36.0 - 46.0 % Final  . MCV 09/24/2013 86.8  78.0 - 100.0 fL Final  . MCH 09/24/2013 29.1  26.0 - 34.0 pg Final  . MCHC 09/24/2013 33.5  30.0 - 36.0 g/dL Final  . RDW 09/24/2013 13.9  11.5 - 15.5 % Final  . Platelets 09/24/2013 172  150 - 400 K/uL Final  . Neutrophils Relative % 09/24/2013 44  43 - 77 % Final  . Neutro Abs 09/24/2013 2.6  1.7 - 7.7 K/uL Final  . Lymphocytes Relative 09/24/2013 42  12 - 46 % Final  . Lymphs Abs 09/24/2013 2.4  0.7 - 4.0 K/uL Final  . Monocytes Relative 09/24/2013 9  3 - 12 % Final  . Monocytes Absolute 09/24/2013 0.5  0.1 - 1.0 K/uL Final  . Eosinophils Relative 09/24/2013 4  0 - 5 % Final  . Eosinophils Absolute 09/24/2013 0.2  0.0 - 0.7 K/uL Final  . Basophils Relative 09/24/2013 1  0 - 1 % Final  . Basophils Absolute 09/24/2013 0.0  0.0 - 0.1 K/uL Final  . Sodium 09/24/2013 140  137 - 147 mEq/L Final  . Potassium 09/24/2013 4.1  3.7 - 5.3 mEq/L Final  . Chloride 09/24/2013 103  96 - 112 mEq/L Final  . CO2 09/24/2013 27  19 - 32 mEq/L Final  . Glucose, Bld 09/24/2013 242* 70 - 99 mg/dL Final  . BUN 09/24/2013 18  6 - 23 mg/dL Final  . Creatinine, Ser 09/24/2013 1.22* 0.50 - 1.10 mg/dL Final  . Calcium  09/24/2013 9.8  8.4 - 10.5 mg/dL Final  . Total Protein 09/24/2013 7.4  6.0 - 8.3 g/dL Final  . Albumin 09/24/2013 3.5  3.5 - 5.2 g/dL Final  . AST 09/24/2013 19  0 - 37 U/L Final  . ALT 09/24/2013 14  0 - 35 U/L Final  . Alkaline Phosphatase 09/24/2013 94  39 - 117 U/L Final  . Total Bilirubin 09/24/2013 0.4  0.3 - 1.2 mg/dL Final  . GFR calc non Af Amer 09/24/2013 48* >90 mL/min Final  . GFR calc Af Amer 09/24/2013 55* >90 mL/min Final   Comment: (NOTE)                          The eGFR has been calculated using the CKD EPI equation.                          This calculation has not been validated in all clinical situations.                          eGFR's persistently <90 mL/min signify possible Chronic Kidney  Disease.  . Anion gap 09/24/2013 10  5 - 15 Final  Lab on 08/27/2013  Component Date Value Ref Range Status  . WBC 08/27/2013 6.0  4.0 - 10.5 K/uL Final  . RBC 08/27/2013 4.67  3.87 - 5.11 MIL/uL Final  . Hemoglobin 08/27/2013 13.4  12.0 - 15.0 g/dL Final  . HCT 08/27/2013 40.8  36.0 - 46.0 % Final  . MCV 08/27/2013 87.4  78.0 - 100.0 fL Final  . MCH 08/27/2013 28.7  26.0 - 34.0 pg Final  . MCHC 08/27/2013 32.8  30.0 - 36.0 g/dL Final  . RDW 08/27/2013 14.1  11.5 - 15.5 % Final  . Platelets 08/27/2013 156  150 - 400 K/uL Final  . Neutrophils Relative % 08/27/2013 52  43 - 77 % Final  . Neutro Abs 08/27/2013 3.1  1.7 - 7.7 K/uL Final  . Lymphocytes Relative 08/27/2013 39  12 - 46 % Final  . Lymphs Abs 08/27/2013 2.3  0.7 - 4.0 K/uL Final  . Monocytes Relative 08/27/2013 6  3 - 12 % Final  . Monocytes Absolute 08/27/2013 0.4  0.1 - 1.0 K/uL Final  . Eosinophils Relative 08/27/2013 3  0 - 5 % Final  . Eosinophils Absolute 08/27/2013 0.2  0.0 - 0.7 K/uL Final  . Basophils Relative 08/27/2013 0  0 - 1 % Final  . Basophils Absolute 08/27/2013 0.0  0.0 - 0.1 K/uL Final  . Sodium 08/27/2013 141  137 - 147 mEq/L Final  . Potassium 08/27/2013 3.9  3.7 -  5.3 mEq/L Final  . Chloride 08/27/2013 104  96 - 112 mEq/L Final  . CO2 08/27/2013 26  19 - 32 mEq/L Final  . Glucose, Bld 08/27/2013 170* 70 - 99 mg/dL Final  . BUN 08/27/2013 22  6 - 23 mg/dL Final  . Creatinine, Ser 08/27/2013 1.15* 0.50 - 1.10 mg/dL Final  . Calcium 08/27/2013 9.3  8.4 - 10.5 mg/dL Final  . Total Protein 08/27/2013 7.3  6.0 - 8.3 g/dL Final  . Albumin 08/27/2013 3.4* 3.5 - 5.2 g/dL Final  . AST 08/27/2013 15  0 - 37 U/L Final  . ALT 08/27/2013 15  0 - 35 U/L Final  . Alkaline Phosphatase 08/27/2013 84  39 - 117 U/L Final  . Total Bilirubin 08/27/2013 0.4  0.3 - 1.2 mg/dL Final  . GFR calc non Af Amer 08/27/2013 51* >90 mL/min Final  . GFR calc Af Amer 08/27/2013 59* >90 mL/min Final   Comment: (NOTE)                          The eGFR has been calculated using the CKD EPI equation.                          This calculation has not been validated in all clinical situations.                          eGFR's persistently <90 mL/min signify possible Chronic Kidney                          Disease.  . Anion gap 08/27/2013 11  5 - 15 Final  . CA 27.29 08/27/2013 26  0 - 39 U/mL Final   Performed at [facility]: no new pathology.  Urinalysis    Component Value  Date/Time   COLORURINE YELLOW 11/15/2012 [place] 11/15/2012 0749   LABSPEC >1.030* 11/15/2012 0749   PHURINE 6.0 11/15/2012 0749   GLUCOSEU 100* 11/15/2012 0749   HGBUR NEGATIVE 11/15/2012 [place] 11/15/2012 [place] 11/15/2012 0749   PROTEINUR NEGATIVE 11/15/2012 0749   UROBILINOGEN 0.2 11/15/2012 0749   NITRITE NEGATIVE 11/15/2012 0749   LEUKOCYTESUR NEGATIVE 11/15/2012 0749    RADIOGRAPHIC STUDIES:  Left mammogram in November 2014 unremarkable.   ASSESSMENT: 1. 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 She presented with a 7 cm tumor, extensive LVI. Tumor focally extended to the  anterior surface margin, 8 of 8 lymph nodes were involved with metastatic disease. She was ER positive 100%, PR positive 99%, HER2 neu negative, Ki-67 marker 14%. No liver involvement, no lung involvement and she had a mastectomy followed by radiation therapy to T4 through T7 due to severe involvement at this area. She was treated with Arimidex initially from 07/2009 through 03/23/2010 and was progressing by bone scan criteria. We switched her to tamoxifen.  She has not received radiation therapy to the chest wall or axilla. Because of worsening bone pain, MRI of lumbar spine completed on 11/29/2012 demonstrated progressive diffuse lumbar spine metastatic disease and therefore therapy was changed to Fulvestrant and Exemestane on 12/11/2012, tolerating well except for vasomotor instability.  #2. Diabetes mellitus, type II, non-insulin requiring, controlled.  #3. Obesity.  #4. Vasomotor instability and knee pain producing insomnia with resultant fatigue, better controlled on high dose Effexor 150 mg XR at bedtime.      PLAN:  #1. Faslodex 500 mg intramuscularly today and monthly. #2. Exemestane 25 mg daily, continue. #3Delton See 120 SUBCUTANEOUS he today and monthly. #4. BMP monthly. #5. Recommend taking 10 mg of oxycodone at bedtime to allow sleep. #6. Office visit with CBC, chem profile, CEA, CA 27-29 in 3 months. #7. Patient refused influenza virus vaccine.   All questions were answered. The patient knows to call the clinic with any problems, questions or concerns. We can certainly see the patient much sooner if necessary.   I spent 25 minutes counseling the patient face to face. The total time spent in the appointment was 30 minutes.    Doroteo Bradford, MD 09/24/2013 9:47 AM  DISCLAIMER:  This note was dictated with voice recognition software.  Similar sounding words can inadvertently be transcribed inaccurately and may not be corrected upon review.

## 2013-10-22 ENCOUNTER — Ambulatory Visit (HOSPITAL_COMMUNITY): Payer: Medicare Other | Admitting: Oncology

## 2013-10-22 ENCOUNTER — Encounter (HOSPITAL_COMMUNITY): Payer: Medicare Other | Attending: Hematology and Oncology

## 2013-10-22 ENCOUNTER — Encounter (HOSPITAL_COMMUNITY): Payer: Medicare Other | Attending: Oncology

## 2013-10-22 VITALS — BP 144/90 | HR 107 | Resp 16

## 2013-10-22 DIAGNOSIS — C50419 Malignant neoplasm of upper-outer quadrant of unspecified female breast: Secondary | ICD-10-CM

## 2013-10-22 DIAGNOSIS — Z5111 Encounter for antineoplastic chemotherapy: Secondary | ICD-10-CM

## 2013-10-22 DIAGNOSIS — C7951 Secondary malignant neoplasm of bone: Secondary | ICD-10-CM | POA: Insufficient documentation

## 2013-10-22 DIAGNOSIS — C50919 Malignant neoplasm of unspecified site of unspecified female breast: Secondary | ICD-10-CM | POA: Diagnosis not present

## 2013-10-22 LAB — CBC WITH DIFFERENTIAL/PLATELET
Basophils Absolute: 0 10*3/uL (ref 0.0–0.1)
Basophils Relative: 1 % (ref 0–1)
EOS ABS: 0.2 10*3/uL (ref 0.0–0.7)
Eosinophils Relative: 3 % (ref 0–5)
HCT: 43.3 % (ref 36.0–46.0)
HEMOGLOBIN: 14.1 g/dL (ref 12.0–15.0)
LYMPHS ABS: 2.5 10*3/uL (ref 0.7–4.0)
LYMPHS PCT: 37 % (ref 12–46)
MCH: 28.3 pg (ref 26.0–34.0)
MCHC: 32.6 g/dL (ref 30.0–36.0)
MCV: 86.9 fL (ref 78.0–100.0)
MONOS PCT: 7 % (ref 3–12)
Monocytes Absolute: 0.5 10*3/uL (ref 0.1–1.0)
NEUTROS PCT: 52 % (ref 43–77)
Neutro Abs: 3.4 10*3/uL (ref 1.7–7.7)
Platelets: 199 10*3/uL (ref 150–400)
RBC: 4.98 MIL/uL (ref 3.87–5.11)
RDW: 13.7 % (ref 11.5–15.5)
WBC: 6.6 10*3/uL (ref 4.0–10.5)

## 2013-10-22 LAB — COMPREHENSIVE METABOLIC PANEL
ALT: 15 U/L (ref 0–35)
AST: 19 U/L (ref 0–37)
Albumin: 3.5 g/dL (ref 3.5–5.2)
Alkaline Phosphatase: 97 U/L (ref 39–117)
Anion gap: 11 (ref 5–15)
BILIRUBIN TOTAL: 0.4 mg/dL (ref 0.3–1.2)
BUN: 19 mg/dL (ref 6–23)
CHLORIDE: 103 meq/L (ref 96–112)
CO2: 26 meq/L (ref 19–32)
Calcium: 9.3 mg/dL (ref 8.4–10.5)
Creatinine, Ser: 1.06 mg/dL (ref 0.50–1.10)
GFR calc Af Amer: 65 mL/min — ABNORMAL LOW (ref >90)
GFR calc non Af Amer: 56 mL/min — ABNORMAL LOW (ref >90)
GLUCOSE: 246 mg/dL — AB (ref 70–99)
POTASSIUM: 4.1 meq/L (ref 3.7–5.3)
SODIUM: 140 meq/L (ref 137–147)
TOTAL PROTEIN: 7.8 g/dL (ref 6.0–8.3)

## 2013-10-22 LAB — CANCER ANTIGEN 27.29: CA 27.29: 28 U/mL (ref 0–39)

## 2013-10-22 MED ORDER — FULVESTRANT 250 MG/5ML IM SOLN
500.0000 mg | INTRAMUSCULAR | Status: DC
  Administered 2013-10-22: 500 mg via INTRAMUSCULAR
  Filled 2013-10-22: qty 10

## 2013-10-22 MED ORDER — OXYCODONE HCL 10 MG PO TABS
10.0000 mg | ORAL_TABLET | ORAL | Status: DC | PRN
Start: 2013-10-22 — End: 2013-11-19

## 2013-10-22 MED ORDER — DENOSUMAB 120 MG/1.7ML ~~LOC~~ SOLN
120.0000 mg | Freq: Once | SUBCUTANEOUS | Status: AC
Start: 2013-10-22 — End: 2013-10-22
  Administered 2013-10-22: 120 mg via SUBCUTANEOUS
  Filled 2013-10-22: qty 1.7

## 2013-10-22 NOTE — Progress Notes (Signed)
LABS FOR CBCD,CMP,[id number] 

## 2013-10-22 NOTE — Progress Notes (Signed)
Kaylee Mitchell's reason for visit today is for an injection and labs as scheduled per MD orders.  Labs were drawn prior to administration of ordered medication.   Kaylee Mitchell also received faslodex and x-geva per MD orders; see MAR for administration details.  Kaylee Mitchell tolerated all procedures well and without incident; questions were answered and patient was discharged.  

## 2013-11-14 DIAGNOSIS — E119 Type 2 diabetes mellitus without complications: Secondary | ICD-10-CM | POA: Diagnosis not present

## 2013-11-14 DIAGNOSIS — J410 Simple chronic bronchitis: Secondary | ICD-10-CM | POA: Diagnosis not present

## 2013-11-19 ENCOUNTER — Encounter (HOSPITAL_BASED_OUTPATIENT_CLINIC_OR_DEPARTMENT_OTHER): Payer: Medicare Other

## 2013-11-19 ENCOUNTER — Encounter (HOSPITAL_COMMUNITY): Payer: Medicare Other | Attending: Oncology

## 2013-11-19 ENCOUNTER — Other Ambulatory Visit (HOSPITAL_COMMUNITY): Payer: Self-pay | Admitting: Hematology and Oncology

## 2013-11-19 DIAGNOSIS — C50919 Malignant neoplasm of unspecified site of unspecified female breast: Secondary | ICD-10-CM | POA: Insufficient documentation

## 2013-11-19 DIAGNOSIS — C7951 Secondary malignant neoplasm of bone: Secondary | ICD-10-CM

## 2013-11-19 DIAGNOSIS — C50411 Malignant neoplasm of upper-outer quadrant of right female breast: Secondary | ICD-10-CM

## 2013-11-19 DIAGNOSIS — Z5111 Encounter for antineoplastic chemotherapy: Secondary | ICD-10-CM

## 2013-11-19 LAB — COMPREHENSIVE METABOLIC PANEL
ALBUMIN: 3.5 g/dL (ref 3.5–5.2)
ALT: 12 U/L (ref 0–35)
AST: 16 U/L (ref 0–37)
Alkaline Phosphatase: 87 U/L (ref 39–117)
Anion gap: 13 (ref 5–15)
BILIRUBIN TOTAL: 0.6 mg/dL (ref 0.3–1.2)
BUN: 19 mg/dL (ref 6–23)
CO2: 27 mEq/L (ref 19–32)
Calcium: 9.4 mg/dL (ref 8.4–10.5)
Chloride: 99 mEq/L (ref 96–112)
Creatinine, Ser: 1.22 mg/dL — ABNORMAL HIGH (ref 0.50–1.10)
GFR calc Af Amer: 55 mL/min — ABNORMAL LOW (ref >90)
GFR calc non Af Amer: 48 mL/min — ABNORMAL LOW (ref >90)
GLUCOSE: 283 mg/dL — AB (ref 70–99)
POTASSIUM: 4.2 meq/L (ref 3.7–5.3)
SODIUM: 139 meq/L (ref 137–147)
TOTAL PROTEIN: 7.7 g/dL (ref 6.0–8.3)

## 2013-11-19 LAB — CBC WITH DIFFERENTIAL/PLATELET
BASOS PCT: 1 % (ref 0–1)
Basophils Absolute: 0 10*3/uL (ref 0.0–0.1)
EOS ABS: 0.2 10*3/uL (ref 0.0–0.7)
Eosinophils Relative: 3 % (ref 0–5)
HCT: 42.1 % (ref 36.0–46.0)
HEMOGLOBIN: 13.7 g/dL (ref 12.0–15.0)
LYMPHS ABS: 2.5 10*3/uL (ref 0.7–4.0)
Lymphocytes Relative: 37 % (ref 12–46)
MCH: 28.3 pg (ref 26.0–34.0)
MCHC: 32.5 g/dL (ref 30.0–36.0)
MCV: 87 fL (ref 78.0–100.0)
MONOS PCT: 7 % (ref 3–12)
Monocytes Absolute: 0.5 10*3/uL (ref 0.1–1.0)
NEUTROS ABS: 3.6 10*3/uL (ref 1.7–7.7)
Neutrophils Relative %: 52 % (ref 43–77)
PLATELETS: 192 10*3/uL (ref 150–400)
RBC: 4.84 MIL/uL (ref 3.87–5.11)
RDW: 13.5 % (ref 11.5–15.5)
WBC: 6.8 10*3/uL (ref 4.0–10.5)

## 2013-11-19 LAB — CANCER ANTIGEN 27.29: CA 27.29: 28 U/mL (ref 0–39)

## 2013-11-19 MED ORDER — EXEMESTANE 25 MG PO TABS: 25.0000 mg | ORAL_TABLET | Freq: Every day | ORAL | Status: DC

## 2013-11-19 MED ORDER — OXYCODONE HCL 10 MG PO TABS: ORAL_TABLET | ORAL | Status: DC

## 2013-11-19 MED ORDER — DENOSUMAB 120 MG/1.7ML ~~LOC~~ SOLN
120.0000 mg | Freq: Once | SUBCUTANEOUS | Status: AC
  Administered 2013-11-19: 120 mg via SUBCUTANEOUS
  Filled 2013-11-19: qty 1.7

## 2013-11-19 MED ORDER — FULVESTRANT 250 MG/5ML IM SOLN
500.0000 mg | INTRAMUSCULAR | Status: DC
  Administered 2013-11-19: 500 mg via INTRAMUSCULAR
  Filled 2013-11-19: qty 10

## 2013-11-19 NOTE — Progress Notes (Signed)
LABS FOR [id number],CBCD,CMP 

## 2013-11-19 NOTE — Patient Instructions (Signed)
[place] Discharge Instructions  RECOMMENDATIONS MADE BY THE CONSULTANT AND ANY TEST RESULTS WILL BE SENT TO YOUR REFERRING PHYSICIAN.  MEDICATIONS PRESCRIBED:  Faslodex injection today. Continue every 28 days. Xgeva injection today. Continue every 28 days. A refill for Exemestane was sent to your pharmacy. You were given a Amellia Panik prescription for Oxycodone.  INSTRUCTIONS/FOLLOW-UP: Return as scheduled. Report any issues/concerns as needed prior to appointments.  Thank you for choosing [facility] to provide your oncology and hematology care.  To afford each patient quality time with our providers, please arrive at least 15 minutes before your scheduled appointment time.  With your help, our goal is to use those 15 minutes to complete the necessary work-up to ensure our physicians have the information they need to help with your evaluation and healthcare recommendations.    Effective January 1st, 2014, we ask that you re-schedule your appointment with our physicians should you arrive 10 or more minutes late for your appointment.  We strive to give you quality time with our providers, and arriving late affects you and other patients whose appointments are after yours.    Again, thank you for choosing [facility].  Our hope is that these requests will decrease the amount of time that you wait before being seen by our physicians.       _____________________________________________________________  Should you have questions after your visit to [facility], please contact our office at [phone] between the hours of 8:30 a.m. and 4:30 p.m.  Voicemails left after 4:30 p.m. will not be returned until the following business day.  For prescription refill requests, have your pharmacy contact our office with your prescription refill request.    _______________________________________________________________  We hope that we have given  you very good care.  You may receive a patient satisfaction survey in the mail, please complete it and return it as soon as possible.  We value your feedback!  _______________________________________________________________  Have you asked about our STAR program?  STAR stands for Survivorship Training and Rehabilitation, and this is a nationally recognized cancer care program that focuses on survivorship and rehabilitation.  Cancer and cancer treatments may cause problems, such as, pain, making you feel tired and keeping you from doing the things that you need or want to do. Cancer rehabilitation can help. Our goal is to reduce these troubling effects and help you have the best quality of life possible.  You may receive a survey from a nurse that asks questions about your current state of health.  Based on the survey results, all eligible patients will be referred to the [facility] program for an evaluation so we can better serve you!  A frequently asked questions sheet is available upon request.

## 2013-11-19 NOTE — Progress Notes (Signed)
Kaylee Mitchell presents today for injection per MD orders. Xgeva 120 mg administered SQ in left Abdomen. Administration without incident. Patient tolerated well. Faslodex 250mg /75ml x 2 injections administered IM both right and left upper outer gluteals.

## 2013-12-17 ENCOUNTER — Encounter (HOSPITAL_COMMUNITY): Payer: Self-pay

## 2013-12-17 ENCOUNTER — Encounter (HOSPITAL_COMMUNITY): Payer: Medicare Other | Attending: Oncology

## 2013-12-17 ENCOUNTER — Other Ambulatory Visit (HOSPITAL_COMMUNITY): Payer: Self-pay | Admitting: Oncology

## 2013-12-17 ENCOUNTER — Ambulatory Visit (HOSPITAL_COMMUNITY)
Admission: RE | Admit: 2013-12-17 | Discharge: 2013-12-17 | Disposition: A | Discharge status: Home / Self Care | Payer: Medicare Other | Source: Ambulatory Visit | Attending: Hematology and Oncology | Admitting: Hematology and Oncology

## 2013-12-17 ENCOUNTER — Encounter (HOSPITAL_BASED_OUTPATIENT_CLINIC_OR_DEPARTMENT_OTHER): Payer: Medicare Other

## 2013-12-17 ENCOUNTER — Ambulatory Visit (HOSPITAL_COMMUNITY): Payer: Medicare Other

## 2013-12-17 VITALS — BP 141/90 | HR 105 | Temp 98.6°F | Resp 20 | Wt 299.2 lb

## 2013-12-17 DIAGNOSIS — I10 Essential (primary) hypertension: Secondary | ICD-10-CM | POA: Insufficient documentation

## 2013-12-17 DIAGNOSIS — C773 Secondary and unspecified malignant neoplasm of axilla and upper limb lymph nodes: Secondary | ICD-10-CM | POA: Diagnosis not present

## 2013-12-17 DIAGNOSIS — C50911 Malignant neoplasm of unspecified site of right female breast: Secondary | ICD-10-CM

## 2013-12-17 DIAGNOSIS — Z5111 Encounter for antineoplastic chemotherapy: Secondary | ICD-10-CM

## 2013-12-17 DIAGNOSIS — C7951 Secondary malignant neoplasm of bone: Secondary | ICD-10-CM | POA: Diagnosis not present

## 2013-12-17 DIAGNOSIS — T502X5A Adverse effect of carbonic-anhydrase inhibitors, benzothiadiazides and other diuretics, initial encounter: Principal | ICD-10-CM

## 2013-12-17 DIAGNOSIS — Z170 Estrogen receptor positive status [ER+]: Secondary | ICD-10-CM | POA: Diagnosis not present

## 2013-12-17 DIAGNOSIS — R05 Cough: Secondary | ICD-10-CM | POA: Diagnosis not present

## 2013-12-17 DIAGNOSIS — E876 Hypokalemia: Secondary | ICD-10-CM

## 2013-12-17 DIAGNOSIS — C50919 Malignant neoplasm of unspecified site of unspecified female breast: Secondary | ICD-10-CM

## 2013-12-17 LAB — COMPREHENSIVE METABOLIC PANEL
ALBUMIN: 3.4 g/dL — AB (ref 3.5–5.2)
ALK PHOS: 84 U/L (ref 39–117)
ALT: 12 U/L (ref 0–35)
AST: 15 U/L (ref 0–37)
Anion gap: 10 (ref 5–15)
BUN: 16 mg/dL (ref 6–23)
CHLORIDE: 101 meq/L (ref 96–112)
CO2: 28 mEq/L (ref 19–32)
Calcium: 9.3 mg/dL (ref 8.4–10.5)
Creatinine, Ser: 1.15 mg/dL — ABNORMAL HIGH (ref 0.50–1.10)
GFR calc Af Amer: 59 mL/min — ABNORMAL LOW (ref >90)
GFR calc non Af Amer: 51 mL/min — ABNORMAL LOW (ref >90)
Glucose, Bld: 185 mg/dL — ABNORMAL HIGH (ref 70–99)
POTASSIUM: 3.6 meq/L — AB (ref 3.7–5.3)
Sodium: 139 mEq/L (ref 137–147)
Total Bilirubin: 0.6 mg/dL (ref 0.3–1.2)
Total Protein: 7.4 g/dL (ref 6.0–8.3)

## 2013-12-17 LAB — CBC WITH DIFFERENTIAL/PLATELET
BASOS PCT: 0 % (ref 0–1)
Basophils Absolute: 0 10*3/uL (ref 0.0–0.1)
Eosinophils Absolute: 0.2 10*3/uL (ref 0.0–0.7)
Eosinophils Relative: 3 % (ref 0–5)
HCT: 42.1 % (ref 36.0–46.0)
HEMOGLOBIN: 13.7 g/dL (ref 12.0–15.0)
LYMPHS PCT: 38 % (ref 12–46)
Lymphs Abs: 2.6 10*3/uL (ref 0.7–4.0)
MCH: 28.6 pg (ref 26.0–34.0)
MCHC: 32.5 g/dL (ref 30.0–36.0)
MCV: 87.9 fL (ref 78.0–100.0)
MONOS PCT: 7 % (ref 3–12)
Monocytes Absolute: 0.4 10*3/uL (ref 0.1–1.0)
NEUTROS ABS: 3.5 10*3/uL (ref 1.7–7.7)
NEUTROS PCT: 52 % (ref 43–77)
Platelets: 180 10*3/uL (ref 150–400)
RBC: 4.79 MIL/uL (ref 3.87–5.11)
RDW: 13.7 % (ref 11.5–15.5)
WBC: 6.8 10*3/uL (ref 4.0–10.5)

## 2013-12-17 LAB — CANCER ANTIGEN 27.29: CA 27.29: 25 U/mL (ref 0–39)

## 2013-12-17 MED ORDER — DENOSUMAB 120 MG/1.7ML ~~LOC~~ SOLN
120.0000 mg | Freq: Once | SUBCUTANEOUS | Status: AC
  Administered 2013-12-17: 120 mg via SUBCUTANEOUS
  Filled 2013-12-17: qty 1.7

## 2013-12-17 MED ORDER — OXYCODONE HCL 10 MG PO TABS: ORAL_TABLET | ORAL | Status: DC

## 2013-12-17 MED ORDER — POTASSIUM CHLORIDE CRYS ER 20 MEQ PO TBCR: 20.0000 meq | EXTENDED_RELEASE_TABLET | Freq: Every day | ORAL | Status: DC

## 2013-12-17 MED ORDER — FLUTICASONE PROPIONATE 50 MCG/ACT NA SUSP: NASAL | Status: DC

## 2013-12-17 MED ORDER — FULVESTRANT 250 MG/5ML IM SOLN
500.0000 mg | INTRAMUSCULAR | Status: DC
  Administered 2013-12-17: 500 mg via INTRAMUSCULAR
  Filled 2013-12-17: qty 10

## 2013-12-17 NOTE — Progress Notes (Signed)
[facility]  OFFICE PROGRESS Jolee Ewing, MD [address] [place] [address]  DIAGNOSIS: Breast CA, right - Plan: DG Chest 2 View, DG Chest 2 View  Bone metastasis - Plan: Oxycodone HCl 10 MG TABS  Chief Complaint  Patient presents with  . Breast Cancer  . Bone Metastasis    CURRENT THERAPY: Faslodex/Xgeva monthly, exemestane 25 mg daily, Effexor XR 150 mg daily. F2  INTERVAL HISTORY: Kaylee Mitchell 58 y.o. 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 Hot flashes have improved on higher dose Effexor. She's had persistent cough particularly when she first awakens in the morning and before she goes to bed at night. She denies any chest pain, PND, orthopnea, or palpitations. She also denies lower extremity swelling or redness, chest pain, abdominal pain, nausea, vomiting, diarrhea, constipation, vaginal bleeding or discharge, headache, or seizures. She is not aware of postnasal drip.  MEDICAL HISTORY: Past Medical History  Diagnosis Date  . Hypertension   . High cholesterol   . Knee pain, bilateral 2013  . Cancer     rt breast is primary  . Bone metastasis 09/13/2010  . Diabetes mellitus     type II  . Depression   . Depression 12/14/2011  . Invasive ductal carcinoma of breast 09/13/2010    INTERIM HISTORY: has Invasive ductal carcinoma of breast; Bone metastasis; Medial meniscus, posterior horn derangement; S/P arthroscopy of left knee; Difficulty in walking; Depression; Diabetes mellitus with chronic kidney disease; CKD (chronic kidney disease); Hyperlipidemia; Essential hypertension, benign; Morbid obesity; Tinea versicolor; Lumbar back pain; and URI, acute on her problem list.    ALLERGIES:  has No Known  Allergies.  MEDICATIONS: has a current medication list which includes the following prescription(s): aspirin ec, atorvastatin, calcium-vitamin d, carvedilol, cyclobenzaprine, docusate sodium, exemestane, insulin glargine, insulin lispro, lisinopril-hydrochlorothiazide, lorazepam, oxycodone hcl, polyethylene glycol 3350, tramadol-acetaminophen, venlafaxine xr, ventolin hfa, fluticasone, ibuprofen, naproxen, and potassium chloride sa, and the following Facility-Administered Medications: 1 mL EPINEPHrine 1 mg/mL (1:1000) in 0.9% Normal Saline 3000 mL irrigation.  SURGICAL HISTORY:  Past Surgical History  Procedure Laterality Date  . Knee surgery      right-arthroscopy  . Mastectomy      right side  . Abdominal hysterectomy      FAMILY HISTORY: family history includes Arthritis in an other family member; Cancer in her maternal aunt and another family member; Diabetes in her father, mother, and another family member. There is no history of Anesthesia problems, Hypotension, Malignant hyperthermia, or Pseudochol deficiency.  SOCIAL HISTORY:  reports that she has never smoked. She has never used smokeless tobacco. She reports that she does not drink alcohol or use illicit drugs.  REVIEW OF SYSTEMS:  Other than that discussed above is noncontributory.  PHYSICAL EXAMINATION: ECOG PERFORMANCE STATUS: 1 - Symptomatic but completely ambulatory  Blood pressure 141/90, pulse 105, temperature 98.6 F (37 C), temperature source Oral, resp. rate 20, weight 299 lb 3.2 oz (135.716 kg), SpO2 98 %.  GENERAL:alert, no distress and comfortable SKIN: skin color, texture, turgor are normal, no rashes or significant lesions EYES: PERLA; Conjunctiva are pink and non-injected, sclera clear SINUSES: No redness or tenderness over maxillary or ethmoid sinuses. Bilateral rhinorrhea. OROPHARYNX:, no erythema on lips, buccal mucosa, or tongue. Posterior oral pharynx exudate.  NECK: supple, thyroid normal size,  non-tender, without nodularity. No masses.  CHEST: Status post right mastectomy with no subcutaneous nodules. Hyperpigmentation changes of radiation. Left breast pendulous with no masses appreciated. LYMPH:  no palpable lymphadenopathy in the cervical, axillary or inguinal LUNGS: clear to auscultation and percussion with normal breathing effort HEART: regular rate & rhythm and no murmurs. ABDOMEN:abdomen soft, non-tender and normal bowel sounds MUSCULOSKELETAL:no cyanosis of digits and no clubbing. Range of motion normal.  NEURO: alert & oriented x 3 with fluent speech, no focal motor/sensory deficits   LABORATORY DATA: Lab on 12/17/2013  Component Date Value Ref Range Status  . WBC 12/17/2013 6.8  4.0 - 10.5 K/uL Final  . RBC 12/17/2013 4.79  3.87 - 5.11 MIL/uL Final  . Hemoglobin 12/17/2013 13.7  12.0 - 15.0 g/dL Final  . HCT 12/17/2013 42.1  36.0 - 46.0 % Final  . MCV 12/17/2013 87.9  78.0 - 100.0 fL Final  . MCH 12/17/2013 28.6  26.0 - 34.0 pg Final  . MCHC 12/17/2013 32.5  30.0 - 36.0 g/dL Final  . RDW 12/17/2013 13.7  11.5 - 15.5 % Final  . Platelets 12/17/2013 180  150 - 400 K/uL Final  . Neutrophils Relative % 12/17/2013 52  43 - 77 % Final  . Neutro Abs 12/17/2013 3.5  1.7 - 7.7 K/uL Final  . Lymphocytes Relative 12/17/2013 38  12 - 46 % Final  . Lymphs Abs 12/17/2013 2.6  0.7 - 4.0 K/uL Final  . Monocytes Relative 12/17/2013 7  3 - 12 % Final  . Monocytes Absolute 12/17/2013 0.4  0.1 - 1.0 K/uL Final  . Eosinophils Relative 12/17/2013 3  0 - 5 % Final  . Eosinophils Absolute 12/17/2013 0.2  0.0 - 0.7 K/uL Final  . Basophils Relative 12/17/2013 0  0 - 1 % Final  . Basophils Absolute 12/17/2013 0.0  0.0 - 0.1 K/uL Final  . Sodium 12/17/2013 139  137 - 147 mEq/L Final  . Potassium 12/17/2013 3.6* 3.7 - 5.3 mEq/L Final  . Chloride 12/17/2013 101  96 - 112 mEq/L Final  . CO2 12/17/2013 28  19 - 32 mEq/L Final  . Glucose, Bld 12/17/2013 185* 70 - 99 mg/dL Final  . BUN  12/17/2013 16  6 - 23 mg/dL Final  . Creatinine, Ser 12/17/2013 1.15* 0.50 - 1.10 mg/dL Final  . Calcium 12/17/2013 9.3  8.4 - 10.5 mg/dL Final  . Total Protein 12/17/2013 7.4  6.0 - 8.3 g/dL Final  . Albumin 12/17/2013 3.4* 3.5 - 5.2 g/dL Final  . AST 12/17/2013 15  0 - 37 U/L Final  . ALT 12/17/2013 12  0 - 35 U/L Final  . Alkaline Phosphatase 12/17/2013 84  39 - 117 U/L Final  . Total Bilirubin 12/17/2013 0.6  0.3 - 1.2 mg/dL Final  . GFR calc non Af Amer 12/17/2013 51* >90 mL/min Final  . GFR calc Af Amer 12/17/2013 59* >90 mL/min Final   Comment: (NOTE) The eGFR has been calculated using the CKD EPI equation. This calculation has not been validated in all clinical situations. eGFR's persistently <90 mL/min signify possible Chronic Kidney Disease.   . Anion gap 12/17/2013 10  5 - 15 Final  Lab on 11/19/2013  Component Date Value Ref Range Status  . WBC 11/19/2013 6.8  4.0 - 10.5 K/uL Final  . RBC 11/19/2013 4.84  3.87 - 5.11 MIL/uL Final  . Hemoglobin 11/19/2013 13.7  12.0 - 15.0 g/dL Final  . HCT 11/19/2013 42.1  36.0 - 46.0 % Final  . MCV 11/19/2013 87.0  78.0 - 100.0 fL Final  . MCH 11/19/2013 28.3  26.0 - 34.0 pg Final  . MCHC 11/19/2013 32.5  30.0 - 36.0 g/dL Final  . RDW 11/19/2013 13.5  11.5 - 15.5 % Final  . Platelets 11/19/2013 192  150 - 400 K/uL Final  . Neutrophils Relative % 11/19/2013 52  43 - 77 % Final  . Neutro Abs 11/19/2013 3.6  1.7 - 7.7 K/uL Final  . Lymphocytes Relative 11/19/2013 37  12 - 46 % Final  . Lymphs Abs 11/19/2013 2.5  0.7 - 4.0 K/uL Final  . Monocytes Relative 11/19/2013 7  3 - 12 % Final  . Monocytes Absolute 11/19/2013 0.5  0.1 - 1.0 K/uL Final  . Eosinophils Relative 11/19/2013 3  0 - 5 % Final  . Eosinophils Absolute 11/19/2013 0.2  0.0 - 0.7 K/uL Final  . Basophils Relative 11/19/2013 1  0 - 1 % Final  . Basophils Absolute 11/19/2013 0.0  0.0 - 0.1 K/uL Final  . Sodium 11/19/2013 139  137 - 147 mEq/L Final  . Potassium 11/19/2013 4.2   3.7 - 5.3 mEq/L Final  . Chloride 11/19/2013 99  96 - 112 mEq/L Final  . CO2 11/19/2013 27  19 - 32 mEq/L Final  . Glucose, Bld 11/19/2013 283* 70 - 99 mg/dL Final  . BUN 11/19/2013 19  6 - 23 mg/dL Final  . Creatinine, Ser 11/19/2013 1.22* 0.50 - 1.10 mg/dL Final  . Calcium 11/19/2013 9.4  8.4 - 10.5 mg/dL Final  . Total Protein 11/19/2013 7.7  6.0 - 8.3 g/dL Final  . Albumin 11/19/2013 3.5  3.5 - 5.2 g/dL Final  . AST 11/19/2013 16  0 - 37 U/L Final  . ALT 11/19/2013 12  0 - 35 U/L Final  . Alkaline Phosphatase 11/19/2013 87  39 - 117 U/L Final  . Total Bilirubin 11/19/2013 0.6  0.3 - 1.2 mg/dL Final  . GFR calc non Af Amer 11/19/2013 48* >90 mL/min Final  . GFR calc Af Amer 11/19/2013 55* >90 mL/min Final   Comment: (NOTE) The eGFR has been calculated using the CKD EPI equation. This calculation has not been validated in all clinical situations. eGFR's persistently <90 mL/min signify possible Chronic Kidney Disease.   . Anion gap 11/19/2013 13  5 - 15 Final  . CA 27.29 11/19/2013 28  0 - 39 U/mL Final   Performed at [facility]: No new pathology.  Urinalysis    Component Value Date/Time   COLORURINE YELLOW 11/15/2012 0749   APPEARANCEUR CLEAR 11/15/2012 0749   LABSPEC >1.030* 11/15/2012 0749   PHURINE 6.0 11/15/2012 0749   GLUCOSEU 100* 11/15/2012 0749   HGBUR NEGATIVE 11/15/2012 0749   BILIRUBINUR NEGATIVE 11/15/2012 0749   KETONESUR NEGATIVE 11/15/2012 0749   PROTEINUR NEGATIVE 11/15/2012 0749   UROBILINOGEN 0.2 11/15/2012 0749   NITRITE NEGATIVE 11/15/2012 0749   LEUKOCYTESUR NEGATIVE 11/15/2012 0749    RADIOGRAPHIC STUDIES: Dg Chest 2 View  12/17/2013   CLINICAL DATA:  Stage IV right breast cancer.  Hypertension.  Cough.  EXAM: CHEST  2 VIEW  COMPARISON:  Bone scan 11/19/2012 Chest x-ray 10/13/2010.  FINDINGS: Mediastinum and hilar structures unremarkable. Heart size normal. No focal infiltrate. No acute bony abnormality. No pleural effusion  or pneumothorax. Surgical clips right axilla. Degenerative changes thoracic spine . Questionable sclerotic mid thoracic vertebral body, most likely T6. This could represent blastic metastatic disease.  Bone scan can be obtained for further evaluation as indicated.  IMPRESSION: 1. No acute cardiopulmonary disease. 2. Questionable sclerotic mid thoracic vertebral body, most likely T6. This could represent blastic metastatic disease. Bone scan can be obtained as needed.   Electronically Signed   By: Marcello Moores  Register   On: 12/17/2013 11:20    ASSESSMENT:  #1. 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 She presented with a 7 cm tumor, extensive LVI. Tumor focally extended to the anterior surface margin, 8 of 8 lymph nodes were involved with metastatic disease. She was ER positive 100%, PR positive 99%, HER2 neu negative, Ki-67 marker 14%. No liver involvement, no lung involvement and she had a mastectomy followed by radiation therapy to T4 through T7 due to severe involvement at this area. She was treated with Arimidex initially from 07/2009 through 03/23/2010 and was progressing by bone scan criteria. We switched her to tamoxifen.  Because of worsening bone pain, MRI of lumbar spine completed on 11/29/2012 demonstrated progressive diffuse lumbar spine metastatic disease and therefore therapy was changed to Fulvestrant and Exemestane on 12/11/2012, tolerating well except for vasomotor instability. Now with persistent cough probably due to postnasal drip, possible lymphangitic spread.. #2.Diabetes mellitus, type II, non-insulin requiring, controlled.  #3. Obesity.  #4. Vasomotor instability and knee pain producing insomnia with resultant fatigue, better controlled on high dose Effexor 150 mg XR at bedtime.  #5. Chronic cough with postnasal drip and occasional wheezing. Possible lymphangitic spread of malignancy.   PLAN:  #1. Faslodex 500 mg intramuscularly along  with Xgeva 120 mg subcutaneously. #2. Chest x-ray PA and lateral today showed only sclerotic bone lesions already known with no evidence of pulmonary infiltrates. Pendulous left breast is obvious. #3. Exemestane 25 mg daily to continue. #4. Oxycodone for pain aching about 3 or 4 per 24 hours. #5. Follow-up in one month with CBC, chem profile, CEA, CA-27-29   All questions were answered. The patient knows to call the clinic with any problems, questions or concerns. We can certainly see the patient much sooner if necessary.   I spent 25 minutes counseling the patient face to face. The total time spent in the appointment was 30 minutes.    Doroteo Bradford, MD 12/17/2013 11:31 AM  DISCLAIMER:  This note was dictated with voice recognition software.  Similar sounding words can inadvertently be transcribed inaccurately and may not be corrected upon review.

## 2013-12-17 NOTE — Progress Notes (Signed)
Kaylee Mitchell's reason for visit today is for an injection and labs as scheduled per MD orders.  Labs were drawn prior to administration of ordered medication.   Kaylee Mitchell also received faslodex and x-geva per MD orders; see MAR for administration details.  Kaylee Mitchell tolerated all procedures well and without incident; questions were answered and patient was discharged.  

## 2013-12-17 NOTE — Progress Notes (Signed)
Labs for [id number],cmp,cbcd 

## 2013-12-17 NOTE — Patient Instructions (Signed)
[facility] Discharge Instructions  RECOMMENDATIONS MADE BY THE CONSULTANT AND ANY TEST RESULTS WILL BE SENT TO YOUR REFERRING PHYSICIAN.  EXAM FINDINGS BY THE PHYSICIAN TODAY AND SIGNS OR SYMPTOMS TO REPORT TO CLINIC OR PRIMARY PHYSICIAN: Exam and findings as discussed by Dr. Barnet Glasgow.  Want you to get a chest xray today.  If any issues with chest xray or labs, we will contact you. Report any new lumps, bone pain, shortness of breath or other symptoms.  MEDICATIONS PRESCRIBED:  Flonase - use as directed  INSTRUCTIONS/FOLLOW-UP: Follow-up in 1 month with labs, office visit and injections.  Thank you for choosing [facility] to provide your oncology and hematology care.  To afford each patient quality time with our providers, please arrive at least 15 minutes before your scheduled appointment time.  With your help, our goal is to use those 15 minutes to complete the necessary work-up to ensure our physicians have the information they need to help with your evaluation and healthcare recommendations.    Effective January 1st, 2014, we ask that you re-schedule your appointment with our physicians should you arrive 10 or more minutes late for your appointment.  We strive to give you quality time with our providers, and arriving late affects you and other patients whose appointments are after yours.    Again, thank you for choosing [facility].  Our hope is that these requests will decrease the amount of time that you wait before being seen by our physicians.       _____________________________________________________________  Should you have questions after your visit to [facility], please contact our office at [phone] between the hours of 8:30 a.m. and 4:30 p.m.  Voicemails left after 4:30 p.m. will not be returned until the following business day.  For prescription refill requests, have your pharmacy contact our office with your  prescription refill request.    _______________________________________________________________  We hope that we have given you very good care.  You may receive a patient satisfaction survey in the mail, please complete it and return it as soon as possible.  We value your feedback!  _______________________________________________________________  Have you asked about our STAR program?  STAR stands for Survivorship Training and Rehabilitation, and this is a nationally recognized cancer care program that focuses on survivorship and rehabilitation.  Cancer and cancer treatments may cause problems, such as, pain, making you feel tired and keeping you from doing the things that you need or want to do. Cancer rehabilitation can help. Our goal is to reduce these troubling effects and help you have the best quality of life possible.  You may receive a survey from a nurse that asks questions about your current state of health.  Based on the survey results, all eligible patients will be referred to the [facility] program for an evaluation so we can better serve you!  A frequently asked questions sheet is available upon request.

## 2014-01-08 ENCOUNTER — Other Ambulatory Visit (HOSPITAL_COMMUNITY): Payer: Self-pay

## 2014-01-08 DIAGNOSIS — C50919 Malignant neoplasm of unspecified site of unspecified female breast: Secondary | ICD-10-CM

## 2014-01-08 DIAGNOSIS — C7951 Secondary malignant neoplasm of bone: Secondary | ICD-10-CM

## 2014-01-14 ENCOUNTER — Other Ambulatory Visit (HOSPITAL_COMMUNITY): Payer: Medicare Other

## 2014-01-14 ENCOUNTER — Ambulatory Visit (HOSPITAL_COMMUNITY): Payer: Medicare Other

## 2014-01-14 ENCOUNTER — Other Ambulatory Visit: Payer: Self-pay | Admitting: Family Medicine

## 2014-01-14 NOTE — Telephone Encounter (Signed)
Medication refilled per protocol. 

## 2014-01-15 NOTE — Progress Notes (Signed)
[place], [place] [address]  Stage IV breast cancer on presentation, bone only  Iinvasive ductal carcinoma of the Right breast. 7 cm primary tumor, extensive LVI. With focal extenion to the anterior surface margin, 8 of 8 lymph nodes were involved with metastatic disease.    ER positive 100%, PR positive 99%, HER2 neu negative, Ki-67 marker 14%.   Mastectomy followed by radiation therapy to T4 through T7 due to severe involvement at this area.  Arimidex initially from 07/2009 through 03/23/2010 and was progressing by bone scan criteria. We switched her to tamoxifen.   Because of worsening bone pain, MRI of lumbar spine completed on 11/29/2012 demonstrated progressive diffuse lumbar spine metastatic disease and therefore therapy was changed to Fulvestrant and Exemestane on 12/11/2012, tolerating well except for vasomotor instability   CURRENT THERAPY: Faslodex/aromasin/Xgeva  INTERVAL HISTORY: Kaylee Mitchell 58 y.o. female returns for follow-up of her breast cancer, stage IV disease. She has not had imaging in some time.  She reports a decrease in her appetite, to the point she no longer eats in the morning at all until around 2 pm. She denies new pain, but has persistent pain in the legs. Her blood sugars have been running "high"  MEDICAL HISTORY: Past Medical History  Diagnosis Date  . Hypertension   . High cholesterol   . Knee pain, bilateral 2013  . Cancer     rt breast is primary  . Bone metastasis 09/13/2010  . Diabetes mellitus     type II  . Depression   . Depression 12/14/2011  . Invasive ductal carcinoma of breast 09/13/2010    has Invasive ductal carcinoma of breast; Bone metastasis; Medial meniscus, posterior horn derangement; S/P arthroscopy of left knee; Difficulty in walking; Depression; Diabetes mellitus with chronic kidney disease; CKD (chronic kidney disease); Hyperlipidemia; Essential hypertension, benign; Morbid  obesity; Tinea versicolor; Lumbar back pain; and URI, acute on her problem list.      Invasive ductal carcinoma of breast   05/26/2009 Initial Diagnosis Needle core biopsy showed invasive mammary carcinoma with 1 + lymph node   06/03/2009 Surgery Right modified radical mastectomy with 8/8 + lymph nodes by Dr. Ronnell Freshwater   07/22/2009 Cancer Staging Bone scan + for multiple osseous lesions.  No disease elsewhere.   07/24/2009 - 03/23/2010 Chemotherapy Arimidex   03/23/2010 Progression    03/23/2010 - 12/05/2012 Chemotherapy Tamoxifen   11/28/2012 Progression Worsening of lumbar spine osseous disease   12/06/2012 -  Chemotherapy Fulvestrant + Exemestane     has No Known Allergies.  We administered denosumab and fulvestrant.  SURGICAL HISTORY: Past Surgical History  Procedure Laterality Date  . Knee surgery      right-arthroscopy  . Mastectomy      right side  . Abdominal hysterectomy      SOCIAL HISTORY: History   Social History  . Marital Status: Single    Spouse Name: N/A    Number of Children: N/A  . Years of Education: 12   Occupational History  . disabled    Social History Main Topics  . Smoking status: Never Smoker   . Smokeless tobacco: Never Used  . Alcohol Use: No  . Drug Use: No  . Sexual Activity: Yes    Birth Control/ Protection: Surgical   Other Topics Concern  . Not on file   Social History Narrative    FAMILY HISTORY: Family History  Problem Relation Age of Onset  . Arthritis    .  Cancer    . Diabetes    . Anesthesia problems Neg Hx   . Hypotension Neg Hx   . Malignant hyperthermia Neg Hx   . Pseudochol deficiency Neg Hx   . Diabetes Mother   . Diabetes Father   . Cancer Maternal Aunt     Review of Systems  Constitutional: Positive for weight loss and malaise/fatigue. Negative for fever, chills and diaphoresis.  HENT: Negative for congestion, ear discharge, ear pain, hearing loss, nosebleeds, sore throat and tinnitus.        Headaches on the  R side of the head, intermittent  Eyes: Negative.   Respiratory: Negative.  Negative for stridor.   Cardiovascular: Negative.   Gastrointestinal: Negative.   Genitourinary: Negative.   Musculoskeletal:       Chronic bilateral leg pain  Skin: Negative.   Neurological: Positive for headaches. Negative for dizziness, tingling, tremors, sensory change, speech change, focal weakness, seizures, loss of consciousness and weakness.  Endo/Heme/Allergies: Negative.   Psychiatric/Behavioral: Negative.     PHYSICAL EXAMINATION  ECOG PERFORMANCE STATUS: 1 - Symptomatic but completely ambulatory  Filed Vitals:   01/16/14 0929  BP: 117/80  Pulse: 99  Temp: 98.3 F (36.8 C)  Resp: 18    Physical Exam  Constitutional: She is oriented to person, place, and time and well-developed, well-nourished, and in no distress.  Obese  HENT:  Head: Normocephalic and atraumatic.  Nose: Nose normal.  Mouth/Throat: Oropharynx is clear and moist. No oropharyngeal exudate.  Eyes: Conjunctivae and EOM are normal. Pupils are equal, round, and reactive to light. Right eye exhibits no discharge. Left eye exhibits no discharge. No scleral icterus.  Neck: Normal range of motion. Neck supple. No tracheal deviation present. No thyromegaly present.  Cardiovascular: Normal rate, regular rhythm and normal heart sounds.  Exam reveals no gallop and no friction rub.   No murmur heard. Pulmonary/Chest: Effort normal and breath sounds normal. She has no wheezes. She has no rales.  Left chest wall with prior mastectomy, incision intact, no nodularity or concerning skin changes  Abdominal: Soft. Bowel sounds are normal. She exhibits no distension and no mass. There is no tenderness. There is no rebound and no guarding.  Musculoskeletal: Normal range of motion. She exhibits no edema.  Lymphadenopathy:    She has no cervical adenopathy.  Neurological: She is alert and oriented to person, place, and time. She has normal  reflexes. No cranial nerve deficit. Gait normal. Coordination normal.  Skin: Skin is warm and dry. No rash noted.  Psychiatric: Mood, memory, affect and judgment normal.  Nursing note and vitals reviewed.   LABORATORY DATA:  CBC    Component Value Date/Time   WBC 6.5 01/16/2014 0850   RBC 4.83 01/16/2014 0850   HGB 13.7 01/16/2014 0850   HCT 42.8 01/16/2014 0850   PLT 174 01/16/2014 0850   MCV 88.6 01/16/2014 0850   MCH 28.4 01/16/2014 0850   MCHC 32.0 01/16/2014 0850   RDW 13.5 01/16/2014 0850   LYMPHSABS 2.8 01/16/2014 0850   MONOABS 0.4 01/16/2014 0850   EOSABS 0.2 01/16/2014 0850   BASOSABS 0.0 01/16/2014 0850   CMP     Component Value Date/Time   NA 135 01/16/2014 0850   K 3.8 01/16/2014 0850   CL 100 01/16/2014 0850   CO2 28 01/16/2014 0850   GLUCOSE 275* 01/16/2014 0850   BUN 22 01/16/2014 0850   CREATININE 1.16* 01/16/2014 0850   CALCIUM 9.3 01/16/2014 0850   PROT 7.4 01/16/2014  0850   ALBUMIN 3.6 01/16/2014 0850   AST 16 01/16/2014 0850   ALT 15 01/16/2014 0850   ALKPHOS 87 01/16/2014 0850   BILITOT 0.5 01/16/2014 0850   GFRNONAA 50* 01/16/2014 0850   GFRAA 58* 01/16/2014 0850      ASSESSMENT and THERAPY PLAN:    Invasive ductal carcinoma of breast It has been awhile since she has had staging. Studies to date have principally been bone scans and recent MRI spine.  I have recommended a PET/CT for re-staging. She is ok with traveling to [place].  For now we will continue her Faslodex/Xgeva.  She is still taking her aromasin and we will discuss discontinuing this at her next visit. Whether or not we need to make any additional changes to her treatment plan will be decided once imaging is obtained and reviewed.  I am going to discontinue further CEA labs on her. We can add CA15-3 to our breast cancer tumor marker panel.  Her CA 27-29 appears to be minimally helpful. I will see her back post PET.    All questions were answered. The patient knows to call  the clinic with any problems, questions or concerns. We can certainly see the patient much sooner if necessary.  Molli Hazard 01/16/2014

## 2014-01-16 ENCOUNTER — Encounter (HOSPITAL_COMMUNITY): Payer: Self-pay | Admitting: Hematology & Oncology

## 2014-01-16 ENCOUNTER — Encounter (HOSPITAL_BASED_OUTPATIENT_CLINIC_OR_DEPARTMENT_OTHER): Payer: Medicare Other

## 2014-01-16 ENCOUNTER — Encounter (HOSPITAL_BASED_OUTPATIENT_CLINIC_OR_DEPARTMENT_OTHER): Payer: Medicare Other | Admitting: Hematology & Oncology

## 2014-01-16 ENCOUNTER — Encounter (HOSPITAL_COMMUNITY): Payer: Medicare Other

## 2014-01-16 VITALS — BP 117/80 | HR 99 | Temp 98.3°F | Resp 18 | Wt 295.4 lb

## 2014-01-16 DIAGNOSIS — C7951 Secondary malignant neoplasm of bone: Secondary | ICD-10-CM

## 2014-01-16 DIAGNOSIS — Z5111 Encounter for antineoplastic chemotherapy: Secondary | ICD-10-CM

## 2014-01-16 DIAGNOSIS — C50919 Malignant neoplasm of unspecified site of unspecified female breast: Secondary | ICD-10-CM | POA: Diagnosis not present

## 2014-01-16 DIAGNOSIS — C50911 Malignant neoplasm of unspecified site of right female breast: Secondary | ICD-10-CM | POA: Diagnosis not present

## 2014-01-16 LAB — COMPREHENSIVE METABOLIC PANEL
ALK PHOS: 87 U/L (ref 39–117)
ALT: 15 U/L (ref 0–35)
AST: 16 U/L (ref 0–37)
Albumin: 3.6 g/dL (ref 3.5–5.2)
Anion gap: 7 (ref 5–15)
BUN: 22 mg/dL (ref 6–23)
CO2: 28 mmol/L (ref 19–32)
Calcium: 9.3 mg/dL (ref 8.4–10.5)
Chloride: 100 mEq/L (ref 96–112)
Creatinine, Ser: 1.16 mg/dL — ABNORMAL HIGH (ref 0.50–1.10)
GFR, EST AFRICAN AMERICAN: 58 mL/min — AB (ref >90)
GFR, EST NON AFRICAN AMERICAN: 50 mL/min — AB (ref >90)
GLUCOSE: 275 mg/dL — AB (ref 70–99)
Potassium: 3.8 mmol/L (ref 3.5–5.1)
Sodium: 135 mmol/L (ref 135–145)
TOTAL PROTEIN: 7.4 g/dL (ref 6.0–8.3)
Total Bilirubin: 0.5 mg/dL (ref 0.3–1.2)

## 2014-01-16 LAB — CBC WITH DIFFERENTIAL/PLATELET
Basophils Absolute: 0 10*3/uL (ref 0.0–0.1)
Basophils Relative: 1 % (ref 0–1)
EOS ABS: 0.2 10*3/uL (ref 0.0–0.7)
EOS PCT: 4 % (ref 0–5)
HCT: 42.8 % (ref 36.0–46.0)
HEMOGLOBIN: 13.7 g/dL (ref 12.0–15.0)
Lymphocytes Relative: 43 % (ref 12–46)
Lymphs Abs: 2.8 10*3/uL (ref 0.7–4.0)
MCH: 28.4 pg (ref 26.0–34.0)
MCHC: 32 g/dL (ref 30.0–36.0)
MCV: 88.6 fL (ref 78.0–100.0)
MONOS PCT: 6 % (ref 3–12)
Monocytes Absolute: 0.4 10*3/uL (ref 0.1–1.0)
Neutro Abs: 3 10*3/uL (ref 1.7–7.7)
Neutrophils Relative %: 46 % (ref 43–77)
Platelets: 174 10*3/uL (ref 150–400)
RBC: 4.83 MIL/uL (ref 3.87–5.11)
RDW: 13.5 % (ref 11.5–15.5)
WBC: 6.5 10*3/uL (ref 4.0–10.5)

## 2014-01-16 MED ORDER — FULVESTRANT 250 MG/5ML IM SOLN
500.0000 mg | INTRAMUSCULAR | Status: DC
  Administered 2014-01-16: 500 mg via INTRAMUSCULAR
  Filled 2014-01-16: qty 10

## 2014-01-16 MED ORDER — DENOSUMAB 120 MG/1.7ML ~~LOC~~ SOLN
120.0000 mg | Freq: Once | SUBCUTANEOUS | Status: AC
  Administered 2014-01-16: 120 mg via SUBCUTANEOUS
  Filled 2014-01-16: qty 1.7

## 2014-01-16 MED ORDER — OXYCODONE HCL 10 MG PO TABS: ORAL_TABLET | ORAL | Status: DC

## 2014-01-16 NOTE — Progress Notes (Signed)
Please see doctors encounter for more information 

## 2014-01-16 NOTE — Progress Notes (Signed)
LABS FOR CMP,CBCD,CEA,[id number]

## 2014-01-16 NOTE — Patient Instructions (Addendum)
[place] Discharge Instructions  RECOMMENDATIONS MADE BY THE CONSULTANT AND ANY TEST RESULTS WILL BE SENT TO YOUR REFERRING PHYSICIAN.  We will continue your current treatment but I have ordered a PET scan to have another look at your cancer. I will see you back after your PET scan to discuss the results and to see if any changes need to be made in your treatment.  Thank you for choosing [facility] to provide your oncology and hematology care.  To afford each patient quality time with our providers, please arrive at least 15 minutes before your scheduled appointment time.  With your help, our goal is to use those 15 minutes to complete the necessary work-up to ensure our physicians have the information they need to help with your evaluation and healthcare recommendations.    Effective January 1st, 2014, we ask that you re-schedule your appointment with our physicians should you arrive 10 or more minutes late for your appointment.  We strive to give you quality time with our providers, and arriving late affects you and other patients whose appointments are after yours.    Again, thank you for choosing [facility].  Our hope is that these requests will decrease the amount of time that you wait before being seen by our physicians.       _____________________________________________________________  Should you have questions after your visit to [facility], please contact our office at [phone] between the hours of 8:30 a.m. and 5:00 p.m.  Voicemails left after 4:30 p.m. will not be returned until the following business day.  For prescription refill requests, have your pharmacy contact our office with your prescription refill request.    Positron Emission Tomography (PET Scan) PET stands for positron emission tomography. This is a test similar to an X-ray. Pictures can be taken of a body part after injection of a very small dose of  a chemical called a radionuclide. This is combined with sugar, water, or ammonia to give off tiny particles called positrons. The positrons emitted are like small bursts of energy that can be detected by a scanner. They are processed by a computer to create images. These images can be used to study different diseases. They are often used to study cancer and cancer therapy. A scan of the entire body can be done and used to study all its parts. Because this test is tagged to a sugar used by cells, the bursts of energy show up differently in cells that use sugar faster. The computer is able to produce a color-coded picture based on this. The colors and amount of brightness on a PET image show different levels of tissue or organ function. For example, a cancer grows faster than healthy tissue and uses more sugar than normal tissue. It will absorb more of the substance injected. This causes it to appear brighter than normal tissue on the PET image. A specialist will read and explain the images. Other examinations, such as recent CT (or CAT) scans or MRI scans may help with interpretation and should be brought along. There are usually no restrictions after the test. You should drink plenty of fluids to flush the radioactive substance from your body. BEFORE THE PROCEDURE   PET is usually an outpatient procedure. Wear comfortable, loose-fitting clothes.  Do not eat for six hours before the scan. You will be encouraged to drink water.  Your caregiver will instruct you regarding the use of medications before  the test.    Do not eat or drink anything with sugar in it 6 hours prior to the scan (no mints, hard candy, gum, etc.)  Do not take your insulin prior to the scan  Note: Diabetic patients should ask for any specific diet guidelines to control glucose (sugar) levels during the day of the test. There are limitations with the test if your blood sugar is not controlled during or before the test.  Be on time  because of the rapid decay of the radioactive material that must be injected. PROCEDURE  Before the procedure begins a small amount of harmless radioactive material will be injected into a vein. This means you will have a needle stick. It will take from 30 minutes to one hour for the material to travel around your body in preparation for the scan. You will lie on a cushioned table and be moved through the center of a machine that looks like a large doughnut. This is the machine that detects the positrons. It is connected to a computer that produces images that can be viewed on a monitor. This will take about 30 minutes to an hour, during which you must remain still. Let your caregiver know if this will be difficult for you. Also, let your caregiver know if you need a sedative or help dealing with claustrophobia (feeling uncomfortable in enclosed spaces). HOME CARE INSTRUCTIONS   For the protection of your privacy, test results can not be given over the phone. Make sure you receive the results of your test. Ask as to how these results are to be obtained if you have not been informed. It is your responsibility to obtain your test results.  Drink several 8-once glasses of water following the test to flush the small amount of radioactive material out of your body.  Keep your follow-up appointments. Document Released: 07/10/2002 Document Revised: 03/28/2011 Document Reviewed: 04/17/2013 [facility] Patient Information 2015 [place], [place]. This information is not intended to replace advice given to you by your health care provider. Make sure you discuss any questions you have with your health care provider.

## 2014-01-16 NOTE — Assessment & Plan Note (Signed)
It has been awhile since she has had staging. Studies to date have principally been bone scans and recent MRI spine.  I have recommended a PET/CT for re-staging. She is ok with traveling to [place].  For now we will continue her Faslodex/Xgeva.  She is still taking her aromasin and we will discuss discontinuing this at her next visit. Whether or not we need to make any additional changes to her treatment plan will be decided once imaging is obtained and reviewed.  I am going to discontinue further CEA labs on her. We can add CA15-3 to our breast cancer tumor marker panel.  Her CA 27-29 appears to be minimally helpful. I will see her back post PET.

## 2014-01-16 NOTE — Progress Notes (Signed)
Kaylee Mitchell presents today for injection per MD orders. Xgeva 120 mg administered SQ in left upper Abdomen. Administration without incident. Patient tolerated well.  Kaylee Mitchell presents today for injection per MD orders. Faslodex 500 mg administered IM z-track in divided doses of 250 mg  in right and left gluteal . Administration without incident. Patient tolerated well.

## 2014-01-19 LAB — CEA: CEA: 3.2 ng/mL (ref 0.0–5.0)

## 2014-01-19 LAB — CANCER ANTIGEN 27.29: CA 27.29: 28 U/mL (ref 0–39)

## 2014-01-24 ENCOUNTER — Ambulatory Visit (HOSPITAL_COMMUNITY)
Admission: RE | Admit: 2014-01-24 | Discharge: 2014-01-24 | Disposition: A | Discharge status: Home / Self Care | Payer: Medicare Other | Source: Ambulatory Visit | Attending: Hematology & Oncology | Admitting: Hematology & Oncology

## 2014-01-24 DIAGNOSIS — I2510 Atherosclerotic heart disease of native coronary artery without angina pectoris: Secondary | ICD-10-CM | POA: Diagnosis not present

## 2014-01-24 DIAGNOSIS — C7951 Secondary malignant neoplasm of bone: Secondary | ICD-10-CM | POA: Diagnosis not present

## 2014-01-24 DIAGNOSIS — Z9011 Acquired absence of right breast and nipple: Secondary | ICD-10-CM | POA: Insufficient documentation

## 2014-01-24 DIAGNOSIS — C50911 Malignant neoplasm of unspecified site of right female breast: Secondary | ICD-10-CM | POA: Diagnosis not present

## 2014-01-24 DIAGNOSIS — C50919 Malignant neoplasm of unspecified site of unspecified female breast: Secondary | ICD-10-CM | POA: Diagnosis present

## 2014-01-24 LAB — GLUCOSE, CAPILLARY: GLUCOSE-CAPILLARY: 108 mg/dL — AB (ref 70–99)

## 2014-01-24 MED ORDER — FLUDEOXYGLUCOSE F - 18 (FDG) INJECTION
14.6700 | Freq: Once | INTRAVENOUS | Status: AC | PRN
  Administered 2014-01-24: 14.67 via INTRAVENOUS

## 2014-01-25 NOTE — Assessment & Plan Note (Signed)
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  On Faslodex and Aromasin with control of disease.  Recent PET scan demonstrates stable disease (01/24/2014) with improvement of bone metastases healing.  Labs every 4 weeks.  Continue Xgeva every 4 weeks, Faslodex every 4 weeks, and Aromasin daily.  Return in 3 months for follow-up.  Will continue with pain control.

## 2014-01-25 NOTE — Progress Notes (Signed)
[facility], [place] [address] [place] [address]  Bone metastasis  Invasive ductal carcinoma of breast, right  CURRENT THERAPY: Faslodex/aromasin/Xgeva  INTERVAL HISTORY: Kaylee Mitchell 59 y.o. 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     Invasive ductal carcinoma of breast   05/26/2009 Initial Diagnosis Needle core biopsy showed invasive mammary carcinoma with 1 + lymph node   06/03/2009 Surgery Right modified radical mastectomy with 8/8 + lymph nodes by Dr. Ronnell Freshwater   07/22/2009 Cancer Staging Bone scan + for multiple osseous lesions.  No disease elsewhere.   07/24/2009 - 03/23/2010 Chemotherapy Arimidex   03/23/2010 Progression    03/23/2010 - 12/05/2012 Chemotherapy Tamoxifen   11/28/2012 Progression Worsening of lumbar spine osseous disease   12/06/2012 -  Chemotherapy Fulvestrant + Exemestane   01/24/2014 PET scan No evidence of hypermetabolic recurrent or metastatic disease. Widespread osseous metastasis, without significant marrow hypermetabolism.   I personally reviewed and went over radiographic studies with the patient.  The results are noted within this dictation.  PET scan demonstrates stability of disease with bone metastases being stable and improved.   She is to continue with current treatment as described.   Past Medical History  Diagnosis Date  . Hypertension   . High cholesterol   . Knee pain, bilateral 2013  . Cancer     rt breast is primary  . Bone metastasis 09/13/2010  . Diabetes mellitus     type II  . Depression   . Depression 12/14/2011  . Invasive ductal carcinoma of breast 09/13/2010    has Invasive ductal carcinoma of breast; Bone metastasis; Medial meniscus, posterior horn derangement; S/P arthroscopy of left knee; Difficulty in walking; Depression; Diabetes mellitus with chronic kidney disease; CKD (chronic kidney disease); Hyperlipidemia; Essential hypertension, benign; Morbid obesity;  Tinea versicolor; Lumbar back pain; and URI, acute on her problem list.     has No Known Allergies.  Ms. [name] had no medications administered during this visit.  Past Surgical History  Procedure Laterality Date  . Knee surgery      right-arthroscopy  . Mastectomy      right side  . Abdominal hysterectomy      Denies any headaches, dizziness, double vision, fevers, chills, night sweats, nausea, vomiting, diarrhea, constipation, chest pain, heart palpitations, shortness of breath, blood in stool, black tarry stool, urinary pain, urinary burning, urinary frequency, hematuria.   PHYSICAL EXAMINATION  ECOG PERFORMANCE STATUS: 0 - Asymptomatic  Filed Vitals:   01/27/14 1132  BP: 147/65  Pulse: 102  Temp: 98.5 F (36.9 C)  Resp: 18    GENERAL:alert, no distress, well nourished, well developed, comfortable, cooperative, obese and smiling SKIN: skin color, texture, turgor are normal, no rashes or significant lesions HEAD: Normocephalic, No masses, lesions, tenderness or abnormalities EYES: normal, PERRLA, EOMI, Conjunctiva are pink and non-injected EARS: External ears normal OROPHARYNX:lips, buccal mucosa, and tongue normal and mucous membranes are moist  NECK: supple, no adenopathy, thyroid normal size, non-tender, without nodularity, no stridor, non-tender, trachea midline LYMPH:  no palpable lymphadenopathy BREAST:not examined LUNGS: not examined HEART: not examined ABDOMEN:not examined BACK: not examined EXTREMITIES:less then 2 second capillary refill, no joint deformities, effusion, or inflammation, no skin discoloration, no clubbing, no cyanosis  NEURO: alert & oriented x 3 with fluent speech, no focal motor/sensory deficits, gait normal    LABORATORY DATA: CBC    Component Value Date/Time   WBC 6.5 01/16/2014 0850  RBC 4.83 01/16/2014 0850   HGB 13.7 01/16/2014 0850   HCT 42.8 01/16/2014 0850   PLT 174 01/16/2014 0850   MCV 88.6 01/16/2014 0850   MCH 28.4  01/16/2014 0850   MCHC 32.0 01/16/2014 0850   RDW 13.5 01/16/2014 0850   LYMPHSABS 2.8 01/16/2014 0850   MONOABS 0.4 01/16/2014 0850   EOSABS 0.2 01/16/2014 0850   BASOSABS 0.0 01/16/2014 0850      Chemistry      Component Value Date/Time   NA 135 01/16/2014 0850   K 3.8 01/16/2014 0850   CL 100 01/16/2014 0850   CO2 28 01/16/2014 0850   BUN 22 01/16/2014 0850   CREATININE 1.16* 01/16/2014 0850      Component Value Date/Time   CALCIUM 9.3 01/16/2014 0850   ALKPHOS 87 01/16/2014 0850   AST 16 01/16/2014 0850   ALT 15 01/16/2014 0850   BILITOT 0.5 01/16/2014 0850     Lab Results  Component Value Date   LABCA2 28 01/16/2014     RADIOGRAPHIC STUDIES:  Nm Pet Image Restag (ps) Skull Base To Thigh  01/24/2014   CLINICAL DATA:  Subsequent treatment strategy for restaging of stage IV breast cancer. Decreased appetite. Right-sided primary with bone metastasis.  EXAM: NUCLEAR MEDICINE PET SKULL BASE TO THIGH  TECHNIQUE: 14.7 mCi F-18 FDG was injected intravenously. Full-ring PET imaging was performed from the skull base to thigh after the radiotracer. CT data was obtained and used for attenuation correction and anatomic localization.  FASTING BLOOD GLUCOSE:  Value: 100 a mg/dl  COMPARISON:  No prior PET. Abdominal pelvic CT 11/15/2012. Lumbar spine MRI 11/28/2012. Chest CT 07/22/2009.  FINDINGS: Mild degradation secondary to patient body habitus.  NECK  No areas of abnormal hypermetabolism.  CHEST  Mild non malignant range hypermetabolism within the medial left breast is favored to be physiologic. Corresponds to mild parenchymal nodularity. 1.7 cm and a S.U.V. max of 1.7 on image 48.  ABDOMEN/PELVIS  Hypermetabolism within the subcutaneous tissues superficial the right gluteal musculature. Corresponds to increased subcutaneous density. Likely due to prior injection or trauma. No suspicious hypermetabolism within the abdomen or pelvis.  SKELETON  Mildly heterogeneous marrow activity. No  well-defined dominant osseous lesion.  CT IMAGES PERFORMED FOR ATTENUATION CORRECTION  No cervical adenopathy. Right mastectomy. Mild cardiomegaly. LAD coronary artery atherosclerosis on image 67. Small hiatal hernia. Enlargement of the pulmonary outflow tract, measuring 3.6 cm. Right axillary node dissection. No axillary adenopathy. Normal adrenal glands. hysterectomy.  Widespread osseous metastasis. Quite similar to 11/15/2012 abdominal pelvic study. Interval increase in sclerosis since the 07/22/2009 chest study. This may represent healing.  IMPRESSION: 1. Right mastectomy and axillary node dissection. No evidence of hypermetabolic recurrent or metastatic disease. 2. Widespread osseous metastasis, without significant marrow hypermetabolism.   Electronically Signed   By: Abigail Miyamoto M.D.   On: 01/24/2014 10:39    ASSESSMENT AND PLAN:  Bone metastasis On Xgeva therapy  Invasive ductal carcinoma of breast Stage IV Breast cancer with bone involvement requiring Xgeva.  On Faslodex and Aromasin with control of disease.  Recent PET scan demonstrates stable disease (01/24/2014) with improvement of bone metastases healing.  Labs every 4 weeks.  Continue Xgeva every 4 weeks, Faslodex every 4 weeks, and Aromasin daily.  Return in 3 months for follow-up.  Will continue with pain control.    THERAPY PLAN:  NCCN guidelines recommends the following for monitoring of metastatic breast cancer:  A. Components of monitoring:   1. Monitoring includes periodic  assessment of varied combinations of symptoms, physical examination, routine laboratory tests, imaging studies, and blood biomarkers where appropriate. Results of monitoring are classified as response/continued response to treatment, stable disease, uncertainty regarding disease status, or progression of disease. The clinician typically must assess and balance multiple different forms of information to make a determination regarding whether disease is being  controlled and the toxicity of treatment is acceptable. Sometimes, this information may be contradictory.  B. Definition of disease progression:   1. Unequivocal evidence of progression of disease by one or more of these factors is required to establish progression of disease, either because of ineffective therapy or acquired resistance of disease to an applied therapy.  Progression of disease may be identified through evidence of growth or worsening of disease at previously known sites of disease and/or of the occurrence of new sites of metastatic disease.   2. Findings concerning for progression of disease include:    A. Worsening symptoms such as pain or dyspnea    B. Evidence of worsening or new disease on physical examination.    C. Declining performance status    D. Unexplained weight loss    E. Increasing Alkaline phosphatase, ALT, AST, or bilirubin    F. Hypercalcemia    G. New radiographic abnormality or increase in the size of pre-existing radiographic abnormality.    H. New areas of abnormality on functional imaging (eg, bone scan, PET/CT scan)    I. Increasing tumor markers (eg, CEA, CA 15-3, CA27.29)   All questions were answered. The patient knows to call the clinic with any problems, questions or concerns. We can certainly see the patient much sooner if necessary.  Patient and plan discussed with Dr. Ancil Linsey and she is in agreement with the aforementioned.   KEFALAS,THOMAS 01/27/2014

## 2014-01-25 NOTE — Assessment & Plan Note (Signed)
On Xgeva therapy

## 2014-01-27 ENCOUNTER — Encounter (HOSPITAL_COMMUNITY): Payer: Self-pay | Admitting: Oncology

## 2014-01-27 ENCOUNTER — Encounter (HOSPITAL_COMMUNITY): Payer: Medicare Other | Attending: Oncology | Admitting: Oncology

## 2014-01-27 VITALS — BP 147/65 | HR 102 | Temp 98.5°F | Resp 18 | Wt 297.6 lb

## 2014-01-27 DIAGNOSIS — C50911 Malignant neoplasm of unspecified site of right female breast: Secondary | ICD-10-CM

## 2014-01-27 DIAGNOSIS — C7951 Secondary malignant neoplasm of bone: Secondary | ICD-10-CM | POA: Insufficient documentation

## 2014-01-27 DIAGNOSIS — C50211 Malignant neoplasm of upper-inner quadrant of right female breast: Secondary | ICD-10-CM | POA: Diagnosis not present

## 2014-01-27 DIAGNOSIS — C50919 Malignant neoplasm of unspecified site of unspecified female breast: Secondary | ICD-10-CM | POA: Insufficient documentation

## 2014-01-27 NOTE — Patient Instructions (Signed)
[facility] Discharge Instructions  RECOMMENDATIONS MADE BY THE CONSULTANT AND ANY TEST RESULTS WILL BE SENT TO YOUR REFERRING PHYSICIAN.  PET scan results are great.  You have the official report.  Continue with Xgeva injections every 4 weeks.  Continue with Aromasin (chemo pill) every day. Return for labs every 4 weeks Return in 3 months for an office visit.   Thank you for choosing [place] at [facility] to  provide your oncology and hematology care. To afford each patient quality  time with our provider, please arrive at least 15 minutes before your  scheduled appointment time.  You need to re-schedule your appointment should you arrive 10 or more  minutes late. We strive to give you quality time with our providers, and  arriving late affects you and other patients whose appointments are after  yours. Also, if you no show three or more times for appointments you may  be dismissed from the clinic at the providers discretion.  Again, thank you for choosing [facility]. Our hope is that  these requests will decrease the amount of time that you wait before being seen by our physicians. _______________________________________________________________  Should you have questions after your visit to [facility], please contact our office at [phone] between the hours of 8:30 a.m. and 5:00 p.m.  Voicemails left after 4:30 p.m. will not be returned until the following business day.  For prescription refill requests, have your pharmacy contact our office with your prescription refill request.

## 2014-02-06 ENCOUNTER — Other Ambulatory Visit (HOSPITAL_COMMUNITY): Payer: Self-pay

## 2014-02-10 ENCOUNTER — Other Ambulatory Visit (HOSPITAL_COMMUNITY): Payer: Self-pay | Admitting: Oncology

## 2014-02-10 DIAGNOSIS — C50911 Malignant neoplasm of unspecified site of right female breast: Secondary | ICD-10-CM

## 2014-02-13 ENCOUNTER — Encounter (HOSPITAL_BASED_OUTPATIENT_CLINIC_OR_DEPARTMENT_OTHER): Payer: Medicare Other

## 2014-02-13 DIAGNOSIS — Z5111 Encounter for antineoplastic chemotherapy: Secondary | ICD-10-CM

## 2014-02-13 DIAGNOSIS — C50911 Malignant neoplasm of unspecified site of right female breast: Secondary | ICD-10-CM

## 2014-02-13 DIAGNOSIS — C50919 Malignant neoplasm of unspecified site of unspecified female breast: Secondary | ICD-10-CM | POA: Diagnosis not present

## 2014-02-13 DIAGNOSIS — C7951 Secondary malignant neoplasm of bone: Secondary | ICD-10-CM | POA: Diagnosis not present

## 2014-02-13 LAB — COMPREHENSIVE METABOLIC PANEL
ALT: 16 U/L (ref 0–35)
ANION GAP: 5 (ref 5–15)
AST: 18 U/L (ref 0–37)
Albumin: 3.6 g/dL (ref 3.5–5.2)
Alkaline Phosphatase: 79 U/L (ref 39–117)
BILIRUBIN TOTAL: 0.7 mg/dL (ref 0.3–1.2)
BUN: 20 mg/dL (ref 6–23)
CALCIUM: 9.4 mg/dL (ref 8.4–10.5)
CO2: 28 mmol/L (ref 19–32)
Chloride: 105 mmol/L (ref 96–112)
Creatinine, Ser: 1.22 mg/dL — ABNORMAL HIGH (ref 0.50–1.10)
GFR calc non Af Amer: 47 mL/min — ABNORMAL LOW (ref >90)
GFR, EST AFRICAN AMERICAN: 55 mL/min — AB (ref >90)
Glucose, Bld: 187 mg/dL — ABNORMAL HIGH (ref 70–99)
POTASSIUM: 4 mmol/L (ref 3.5–5.1)
SODIUM: 138 mmol/L (ref 135–145)
Total Protein: 7.6 g/dL (ref 6.0–8.3)

## 2014-02-13 LAB — CBC WITH DIFFERENTIAL/PLATELET
Basophils Absolute: 0 10*3/uL (ref 0.0–0.1)
Basophils Relative: 0 % (ref 0–1)
Eosinophils Absolute: 0.2 10*3/uL (ref 0.0–0.7)
Eosinophils Relative: 3 % (ref 0–5)
HCT: 42.4 % (ref 36.0–46.0)
Hemoglobin: 13.5 g/dL (ref 12.0–15.0)
Lymphocytes Relative: 36 % (ref 12–46)
Lymphs Abs: 2.4 10*3/uL (ref 0.7–4.0)
MCH: 28.3 pg (ref 26.0–34.0)
MCHC: 31.8 g/dL (ref 30.0–36.0)
MCV: 88.9 fL (ref 78.0–100.0)
MONO ABS: 0.5 10*3/uL (ref 0.1–1.0)
MONOS PCT: 7 % (ref 3–12)
NEUTROS ABS: 3.6 10*3/uL (ref 1.7–7.7)
NEUTROS PCT: 54 % (ref 43–77)
PLATELETS: 176 10*3/uL (ref 150–400)
RBC: 4.77 MIL/uL (ref 3.87–5.11)
RDW: 14.1 % (ref 11.5–15.5)
WBC: 6.7 10*3/uL (ref 4.0–10.5)

## 2014-02-13 MED ORDER — FULVESTRANT 250 MG/5ML IM SOLN
500.0000 mg | INTRAMUSCULAR | Status: DC
  Administered 2014-02-13: 500 mg via INTRAMUSCULAR
  Filled 2014-02-13: qty 10

## 2014-02-13 MED ORDER — OXYCODONE HCL 10 MG PO TABS: ORAL_TABLET | ORAL | Status: DC

## 2014-02-13 MED ORDER — DENOSUMAB 120 MG/1.7ML ~~LOC~~ SOLN
120.0000 mg | Freq: Once | SUBCUTANEOUS | Status: AC
  Administered 2014-02-13: 120 mg via SUBCUTANEOUS
  Filled 2014-02-13: qty 1.7

## 2014-02-13 NOTE — Progress Notes (Signed)
LABS FOR [id number],CMP,CBCD

## 2014-02-13 NOTE — Progress Notes (Signed)
Kaylee Mitchell presents today for injection per MD orders. Xgeva 120 mg administered SQ in left Abdomen. Administration without incident. Patient tolerated well.  Kaylee Mitchell presents today for injection per MD orders. Faslodex 500 mgadministered IM z-track in divided doses 250 mg each in right and left gluteal. Administration without incident. Patient tolerated well.

## 2014-02-13 NOTE — Addendum Note (Signed)
Addended by: Baird Cancer on: 02/13/2014 10:17 AM   Modules accepted: Orders

## 2014-03-05 LAB — CANCER ANTIGEN 27.29: CA 27.29: 24 U/mL (ref 0.0–38.6)

## 2014-03-07 ENCOUNTER — Other Ambulatory Visit (HOSPITAL_COMMUNITY): Payer: Self-pay | Admitting: *Deleted

## 2014-03-07 ENCOUNTER — Encounter (HOSPITAL_COMMUNITY): Payer: Self-pay | Admitting: *Deleted

## 2014-03-07 DIAGNOSIS — C50911 Malignant neoplasm of unspecified site of right female breast: Secondary | ICD-10-CM

## 2014-03-07 MED ORDER — OMEPRAZOLE 40 MG PO CPDR: 40.0000 mg | DELAYED_RELEASE_CAPSULE | Freq: Every day | ORAL | Status: DC

## 2014-03-07 MED ORDER — ONDANSETRON HCL 8 MG PO TABS: 8.0000 mg | ORAL_TABLET | Freq: Three times a day (TID) | ORAL | Status: DC | PRN

## 2014-03-12 ENCOUNTER — Encounter (HOSPITAL_BASED_OUTPATIENT_CLINIC_OR_DEPARTMENT_OTHER): Payer: Medicare Other

## 2014-03-12 ENCOUNTER — Encounter (HOSPITAL_COMMUNITY): Payer: Medicare Other | Attending: Oncology

## 2014-03-12 DIAGNOSIS — Z5111 Encounter for antineoplastic chemotherapy: Secondary | ICD-10-CM | POA: Diagnosis not present

## 2014-03-12 DIAGNOSIS — C50919 Malignant neoplasm of unspecified site of unspecified female breast: Secondary | ICD-10-CM | POA: Insufficient documentation

## 2014-03-12 DIAGNOSIS — C50911 Malignant neoplasm of unspecified site of right female breast: Secondary | ICD-10-CM | POA: Diagnosis not present

## 2014-03-12 DIAGNOSIS — C7951 Secondary malignant neoplasm of bone: Secondary | ICD-10-CM

## 2014-03-12 DIAGNOSIS — C50211 Malignant neoplasm of upper-inner quadrant of right female breast: Secondary | ICD-10-CM

## 2014-03-12 DIAGNOSIS — C773 Secondary and unspecified malignant neoplasm of axilla and upper limb lymph nodes: Secondary | ICD-10-CM | POA: Diagnosis not present

## 2014-03-12 LAB — CBC WITH DIFFERENTIAL/PLATELET
Basophils Absolute: 0 10*3/uL (ref 0.0–0.1)
Basophils Relative: 1 % (ref 0–1)
EOS ABS: 0.2 10*3/uL (ref 0.0–0.7)
Eosinophils Relative: 3 % (ref 0–5)
HCT: 41.8 % (ref 36.0–46.0)
Hemoglobin: 13.5 g/dL (ref 12.0–15.0)
LYMPHS ABS: 2.6 10*3/uL (ref 0.7–4.0)
LYMPHS PCT: 41 % (ref 12–46)
MCH: 28.8 pg (ref 26.0–34.0)
MCHC: 32.3 g/dL (ref 30.0–36.0)
MCV: 89.3 fL (ref 78.0–100.0)
MONOS PCT: 10 % (ref 3–12)
Monocytes Absolute: 0.6 10*3/uL (ref 0.1–1.0)
Neutro Abs: 2.8 10*3/uL (ref 1.7–7.7)
Neutrophils Relative %: 45 % (ref 43–77)
PLATELETS: 160 10*3/uL (ref 150–400)
RBC: 4.68 MIL/uL (ref 3.87–5.11)
RDW: 13.6 % (ref 11.5–15.5)
WBC: 6.2 10*3/uL (ref 4.0–10.5)

## 2014-03-12 LAB — COMPREHENSIVE METABOLIC PANEL
ALK PHOS: 85 U/L (ref 39–117)
ALT: 16 U/L (ref 0–35)
AST: 18 U/L (ref 0–37)
Albumin: 3.7 g/dL (ref 3.5–5.2)
BILIRUBIN TOTAL: 0.6 mg/dL (ref 0.3–1.2)
BUN: 27 mg/dL — ABNORMAL HIGH (ref 6–23)
CHLORIDE: 107 mmol/L (ref 96–112)
CO2: 28 mmol/L (ref 19–32)
Calcium: 9.2 mg/dL (ref 8.4–10.5)
Creatinine, Ser: 1.32 mg/dL — ABNORMAL HIGH (ref 0.50–1.10)
GFR, EST AFRICAN AMERICAN: 50 mL/min — AB (ref >90)
GFR, EST NON AFRICAN AMERICAN: 43 mL/min — AB (ref >90)
GLUCOSE: 275 mg/dL — AB (ref 70–99)
POTASSIUM: 4.4 mmol/L (ref 3.5–5.1)
Sodium: 135 mmol/L (ref 135–145)
Total Protein: 7.6 g/dL (ref 6.0–8.3)

## 2014-03-12 MED ORDER — FULVESTRANT 250 MG/5ML IM SOLN
500.0000 mg | INTRAMUSCULAR | Status: DC
  Administered 2014-03-12: 500 mg via INTRAMUSCULAR
  Filled 2014-03-12: qty 10

## 2014-03-12 MED ORDER — DENOSUMAB 120 MG/1.7ML ~~LOC~~ SOLN
120.0000 mg | Freq: Once | SUBCUTANEOUS | Status: AC
  Administered 2014-03-12: 120 mg via SUBCUTANEOUS
  Filled 2014-03-12: qty 1.7

## 2014-03-12 MED ORDER — OXYCODONE HCL 10 MG PO TABS: ORAL_TABLET | ORAL | Status: DC

## 2014-03-12 NOTE — Patient Instructions (Signed)
[place] at [facility] Discharge Instructions  RECOMMENDATIONS MADE BY THE CONSULTANT AND ANY TEST RESULTS WILL BE SENT TO YOUR REFERRING PHYSICIAN.  Faslodex and Xgeva injections given today as ordered. Return as scheduled.  Thank you for choosing [place] at [facility] to provide your oncology and hematology care.  To afford each patient quality time with our provider, please arrive at least 15 minutes before your scheduled appointment time.    You need to re-schedule your appointment should you arrive 10 or more minutes late.  We strive to give you quality time with our providers, and arriving late affects you and other patients whose appointments are after yours.  Also, if you no show three or more times for appointments you may be dismissed from the clinic at the providers discretion.     Again, thank you for choosing [facility].  Our hope is that these requests will decrease the amount of time that you wait before being seen by our physicians.       _____________________________________________________________  Should you have questions after your visit to [facility], please contact our office at [phone] between the hours of 8:30 a.m. and 4:30 p.m.  Voicemails left after 4:30 p.m. will not be returned until the following business day.  For prescription refill requests, have your pharmacy contact our office.

## 2014-03-12 NOTE — Progress Notes (Signed)
LABS FOR [id number],CBCD,CMP

## 2014-03-12 NOTE — Addendum Note (Signed)
Addended by: Baird Cancer on: 03/12/2014 10:44 AM   Modules accepted: Orders

## 2014-03-12 NOTE — Progress Notes (Signed)
Patient notified

## 2014-03-12 NOTE — Progress Notes (Signed)
Kaylee Mitchell presents today for injection per MD orders. Xgeva 120mg  administered SQ in right Abdomen. Administration without incident. Patient tolerated well.  Kaylee Mitchell presents today for injection per MD orders. Faslodex 250mg /25ml x 2 separate injections administered IM in both left and right upper outer Gluteals. For a total dose of 500mg . Administration without incident. Patient tolerated well.

## 2014-03-13 ENCOUNTER — Encounter (HOSPITAL_COMMUNITY): Payer: Medicare Other

## 2014-03-13 LAB — CANCER ANTIGEN 27.29: CA 27.29: 27.3 U/mL (ref 0.0–38.6)

## 2014-03-14 ENCOUNTER — Encounter (HOSPITAL_COMMUNITY): Payer: Medicare Other

## 2014-03-18 DIAGNOSIS — I10 Essential (primary) hypertension: Secondary | ICD-10-CM | POA: Diagnosis not present

## 2014-03-18 DIAGNOSIS — E1165 Type 2 diabetes mellitus with hyperglycemia: Secondary | ICD-10-CM | POA: Diagnosis not present

## 2014-03-18 DIAGNOSIS — E785 Hyperlipidemia, unspecified: Secondary | ICD-10-CM | POA: Diagnosis not present

## 2014-03-26 ENCOUNTER — Other Ambulatory Visit: Payer: Self-pay | Admitting: Family Medicine

## 2014-04-08 DIAGNOSIS — L84 Corns and callosities: Secondary | ICD-10-CM | POA: Diagnosis not present

## 2014-04-08 DIAGNOSIS — E1151 Type 2 diabetes mellitus with diabetic peripheral angiopathy without gangrene: Secondary | ICD-10-CM | POA: Diagnosis not present

## 2014-04-08 DIAGNOSIS — B351 Tinea unguium: Secondary | ICD-10-CM | POA: Diagnosis not present

## 2014-04-10 ENCOUNTER — Encounter (HOSPITAL_BASED_OUTPATIENT_CLINIC_OR_DEPARTMENT_OTHER): Payer: Medicare Other

## 2014-04-10 ENCOUNTER — Encounter (HOSPITAL_COMMUNITY): Payer: Medicare Other | Attending: Oncology

## 2014-04-10 ENCOUNTER — Encounter (HOSPITAL_COMMUNITY): Payer: Self-pay

## 2014-04-10 DIAGNOSIS — C7951 Secondary malignant neoplasm of bone: Secondary | ICD-10-CM | POA: Insufficient documentation

## 2014-04-10 DIAGNOSIS — C50911 Malignant neoplasm of unspecified site of right female breast: Secondary | ICD-10-CM

## 2014-04-10 DIAGNOSIS — C50211 Malignant neoplasm of upper-inner quadrant of right female breast: Secondary | ICD-10-CM

## 2014-04-10 DIAGNOSIS — Z5111 Encounter for antineoplastic chemotherapy: Secondary | ICD-10-CM

## 2014-04-10 DIAGNOSIS — C50919 Malignant neoplasm of unspecified site of unspecified female breast: Secondary | ICD-10-CM | POA: Diagnosis not present

## 2014-04-10 DIAGNOSIS — C773 Secondary and unspecified malignant neoplasm of axilla and upper limb lymph nodes: Secondary | ICD-10-CM | POA: Diagnosis not present

## 2014-04-10 LAB — CBC WITH DIFFERENTIAL/PLATELET
Basophils Absolute: 0 10*3/uL (ref 0.0–0.1)
Basophils Relative: 1 % (ref 0–1)
EOS ABS: 0.2 10*3/uL (ref 0.0–0.7)
Eosinophils Relative: 3 % (ref 0–5)
HCT: 41.6 % (ref 36.0–46.0)
Hemoglobin: 13.6 g/dL (ref 12.0–15.0)
Lymphocytes Relative: 40 % (ref 12–46)
Lymphs Abs: 2.6 10*3/uL (ref 0.7–4.0)
MCH: 29.1 pg (ref 26.0–34.0)
MCHC: 32.7 g/dL (ref 30.0–36.0)
MCV: 89.1 fL (ref 78.0–100.0)
Monocytes Absolute: 0.4 10*3/uL (ref 0.1–1.0)
Monocytes Relative: 7 % (ref 3–12)
NEUTROS PCT: 49 % (ref 43–77)
Neutro Abs: 3.2 10*3/uL (ref 1.7–7.7)
PLATELETS: 167 10*3/uL (ref 150–400)
RBC: 4.67 MIL/uL (ref 3.87–5.11)
RDW: 13.8 % (ref 11.5–15.5)
WBC: 6.4 10*3/uL (ref 4.0–10.5)

## 2014-04-10 LAB — COMPREHENSIVE METABOLIC PANEL
ALBUMIN: 3.5 g/dL (ref 3.5–5.2)
ALK PHOS: 86 U/L (ref 39–117)
ALT: 17 U/L (ref 0–35)
AST: 23 U/L (ref 0–37)
Anion gap: 7 (ref 5–15)
BUN: 27 mg/dL — ABNORMAL HIGH (ref 6–23)
CO2: 26 mmol/L (ref 19–32)
CREATININE: 1.21 mg/dL — AB (ref 0.50–1.10)
Calcium: 8.5 mg/dL (ref 8.4–10.5)
Chloride: 106 mmol/L (ref 96–112)
GFR calc Af Amer: 55 mL/min — ABNORMAL LOW (ref >90)
GFR, EST NON AFRICAN AMERICAN: 48 mL/min — AB (ref >90)
Glucose, Bld: 188 mg/dL — ABNORMAL HIGH (ref 70–99)
Potassium: 3.7 mmol/L (ref 3.5–5.1)
Sodium: 139 mmol/L (ref 135–145)
Total Bilirubin: 0.5 mg/dL (ref 0.3–1.2)
Total Protein: 7.3 g/dL (ref 6.0–8.3)

## 2014-04-10 MED ORDER — OXYCODONE HCL 10 MG PO TABS: ORAL_TABLET | ORAL | Status: DC

## 2014-04-10 MED ORDER — DENOSUMAB 120 MG/1.7ML ~~LOC~~ SOLN
120.0000 mg | Freq: Once | SUBCUTANEOUS | Status: AC
  Administered 2014-04-10: 120 mg via SUBCUTANEOUS
  Filled 2014-04-10: qty 1.7

## 2014-04-10 MED ORDER — FULVESTRANT 250 MG/5ML IM SOLN
500.0000 mg | INTRAMUSCULAR | Status: DC
  Administered 2014-04-10: 500 mg via INTRAMUSCULAR
  Filled 2014-04-10: qty 10

## 2014-04-10 NOTE — Progress Notes (Signed)
LABS DRAWN

## 2014-04-10 NOTE — Progress Notes (Signed)
Kaylee Mitchell presents today for injection per MD orders. xgeva 120 mg administered SQ in left Abdomen. Faslodex 500 mg administered IM in rt and left gluteal Administration without incident. Patient tolerated well.

## 2014-04-10 NOTE — Addendum Note (Signed)
Addended by: Baird Cancer on: 04/10/2014 10:17 AM   Modules accepted: Orders

## 2014-04-11 LAB — CANCER ANTIGEN 27.29: CA 27.29: 31.3 U/mL (ref 0.0–38.6)

## 2014-04-15 DIAGNOSIS — E119 Type 2 diabetes mellitus without complications: Secondary | ICD-10-CM | POA: Diagnosis not present

## 2014-04-15 DIAGNOSIS — H2513 Age-related nuclear cataract, bilateral: Secondary | ICD-10-CM | POA: Diagnosis not present

## 2014-04-15 DIAGNOSIS — Z794 Long term (current) use of insulin: Secondary | ICD-10-CM | POA: Diagnosis not present

## 2014-04-28 ENCOUNTER — Ambulatory Visit (HOSPITAL_COMMUNITY): Payer: Medicare Other | Admitting: Oncology

## 2014-05-08 ENCOUNTER — Encounter (HOSPITAL_COMMUNITY): Payer: Medicare Other | Attending: Oncology

## 2014-05-08 ENCOUNTER — Encounter (HOSPITAL_BASED_OUTPATIENT_CLINIC_OR_DEPARTMENT_OTHER): Payer: Medicare Other

## 2014-05-08 ENCOUNTER — Encounter (HOSPITAL_BASED_OUTPATIENT_CLINIC_OR_DEPARTMENT_OTHER): Payer: Medicare Other | Admitting: Oncology

## 2014-05-08 ENCOUNTER — Encounter (HOSPITAL_COMMUNITY): Payer: Medicare Other

## 2014-05-08 VITALS — BP 118/100 | HR 94 | Temp 98.1°F | Resp 20 | Wt 302.3 lb

## 2014-05-08 DIAGNOSIS — C7951 Secondary malignant neoplasm of bone: Secondary | ICD-10-CM | POA: Insufficient documentation

## 2014-05-08 DIAGNOSIS — C50911 Malignant neoplasm of unspecified site of right female breast: Secondary | ICD-10-CM | POA: Diagnosis not present

## 2014-05-08 DIAGNOSIS — Z5111 Encounter for antineoplastic chemotherapy: Secondary | ICD-10-CM

## 2014-05-08 DIAGNOSIS — C50919 Malignant neoplasm of unspecified site of unspecified female breast: Secondary | ICD-10-CM | POA: Insufficient documentation

## 2014-05-08 LAB — COMPREHENSIVE METABOLIC PANEL
ALBUMIN: 3.6 g/dL (ref 3.5–5.2)
ALK PHOS: 96 U/L (ref 39–117)
ALT: 15 U/L (ref 0–35)
AST: 19 U/L (ref 0–37)
Anion gap: 7 (ref 5–15)
BILIRUBIN TOTAL: 0.6 mg/dL (ref 0.3–1.2)
BUN: 23 mg/dL (ref 6–23)
CO2: 26 mmol/L (ref 19–32)
CREATININE: 1.26 mg/dL — AB (ref 0.50–1.10)
Calcium: 9.3 mg/dL (ref 8.4–10.5)
Chloride: 104 mmol/L (ref 96–112)
GFR, EST AFRICAN AMERICAN: 53 mL/min — AB (ref >90)
GFR, EST NON AFRICAN AMERICAN: 45 mL/min — AB (ref >90)
Glucose, Bld: 270 mg/dL — ABNORMAL HIGH (ref 70–99)
Potassium: 4.2 mmol/L (ref 3.5–5.1)
Sodium: 137 mmol/L (ref 135–145)
Total Protein: 7.4 g/dL (ref 6.0–8.3)

## 2014-05-08 LAB — CBC WITH DIFFERENTIAL/PLATELET
BASOS ABS: 0 10*3/uL (ref 0.0–0.1)
Basophils Relative: 0 % (ref 0–1)
Eosinophils Absolute: 0.2 10*3/uL (ref 0.0–0.7)
Eosinophils Relative: 3 % (ref 0–5)
HCT: 41.6 % (ref 36.0–46.0)
HEMOGLOBIN: 13.3 g/dL (ref 12.0–15.0)
Lymphocytes Relative: 35 % (ref 12–46)
Lymphs Abs: 2.4 10*3/uL (ref 0.7–4.0)
MCH: 28.3 pg (ref 26.0–34.0)
MCHC: 32 g/dL (ref 30.0–36.0)
MCV: 88.5 fL (ref 78.0–100.0)
Monocytes Absolute: 0.5 10*3/uL (ref 0.1–1.0)
Monocytes Relative: 7 % (ref 3–12)
Neutro Abs: 3.8 10*3/uL (ref 1.7–7.7)
Neutrophils Relative %: 55 % (ref 43–77)
Platelets: 174 10*3/uL (ref 150–400)
RBC: 4.7 MIL/uL (ref 3.87–5.11)
RDW: 13.5 % (ref 11.5–15.5)
WBC: 6.8 10*3/uL (ref 4.0–10.5)

## 2014-05-08 MED ORDER — DENOSUMAB 120 MG/1.7ML ~~LOC~~ SOLN
120.0000 mg | Freq: Once | SUBCUTANEOUS | Status: AC
Start: 2014-05-08 — End: 2014-05-08
  Administered 2014-05-08: 120 mg via SUBCUTANEOUS
  Filled 2014-05-08: qty 1.7

## 2014-05-08 MED ORDER — FULVESTRANT 250 MG/5ML IM SOLN
500.0000 mg | INTRAMUSCULAR | Status: DC
  Administered 2014-05-08: 500 mg via INTRAMUSCULAR
  Filled 2014-05-08: qty 10

## 2014-05-08 MED ORDER — HYDROCODONE-ACETAMINOPHEN 10-325 MG PO TABS: 1.0000 | ORAL_TABLET | Freq: Four times a day (QID) | ORAL | Status: DC | PRN

## 2014-05-08 NOTE — Progress Notes (Signed)
Labs drawn

## 2014-05-08 NOTE — Progress Notes (Signed)
See MD exam notes 

## 2014-05-08 NOTE — Patient Instructions (Signed)
[place] at [facility] Discharge Instructions  RECOMMENDATIONS MADE BY THE CONSULTANT AND ANY TEST RESULTS WILL BE SENT TO YOUR REFERRING PHYSICIAN.  Exam and discussion by Robynn Pane, PA-C Will continue current treatment Will prescribe Vicodin for pain and will try to get an appeal for the Percocet. Labs every [redacted] weeks along with your xgeva and faslodex. Office visit in 3 months.  Thank you for choosing [place] at [facility] to provide your oncology and hematology care.  To afford each patient quality time with our provider, please arrive at least 15 minutes before your scheduled appointment time.    You need to re-schedule your appointment should you arrive 10 or more minutes late.  We strive to give you quality time with our providers, and arriving late affects you and other patients whose appointments are after yours.  Also, if you no show three or more times for appointments you may be dismissed from the clinic at the providers discretion.     Again, thank you for choosing [facility].  Our hope is that these requests will decrease the amount of time that you wait before being seen by our physicians.       _____________________________________________________________  Should you have questions after your visit to [facility], please contact our office at [phone] between the hours of 8:30 a.m. and 4:30 p.m.  Voicemails left after 4:30 p.m. will not be returned until the following business day.  For prescription refill requests, have your pharmacy contact our office.

## 2014-05-08 NOTE — Progress Notes (Signed)
[facility], MD [address] [place] [address]  Invasive ductal carcinoma of breast, right - Plan: HYDROcodone-acetaminophen ([place]) 10-325 MG per tablet  CURRENT THERAPY: Faslodex/aromasin/Xgeva  INTERVAL HISTORY: Kaylee Mitchell 59 y.o. 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     Invasive ductal carcinoma of breast   05/26/2009 Initial Diagnosis Needle core biopsy showed invasive mammary carcinoma with 1 + lymph node   06/03/2009 Surgery Right modified radical mastectomy with 8/8 + lymph nodes by Dr. Ronnell Mitchell   07/22/2009 Cancer Staging Bone scan + for multiple osseous lesions.  No disease elsewhere.   07/24/2009 - 03/23/2010 Chemotherapy Arimidex   03/23/2010 Progression    03/23/2010 - 12/05/2012 Chemotherapy Tamoxifen   11/28/2012 Progression Worsening of lumbar spine osseous disease   12/06/2012 -  Chemotherapy Fulvestrant + Exemestane   01/24/2014 PET scan No evidence of hypermetabolic recurrent or metastatic disease. Widespread osseous metastasis, without significant marrow hypermetabolism.    I personally reviewed and went over laboratory results with the patient.  The results are noted within this dictation.  She notes that she is doing well and tolerating therapy as expected.  She denies any complaints.  She is concerned about her insurance denial of Oxycodone which she has taken for quite some time with excellent pain control.  I will work through the appeal process for her.    Oncologically, she denies any complaints and ROS questioning is negative.   Past Medical History  Diagnosis Date  . Hypertension   . High cholesterol   . Knee pain, bilateral 2013  . Cancer     rt breast is primary  . Bone metastasis 09/13/2010  . Diabetes mellitus     type II  . Depression   . Depression 12/14/2011  . Invasive ductal carcinoma of breast 09/13/2010    has Invasive ductal carcinoma of breast; Bone metastasis; Medial  meniscus, posterior horn derangement; S/P arthroscopy of left knee; Difficulty in walking(719.7); Depression; Diabetes mellitus with chronic kidney disease; CKD (chronic kidney disease); Hyperlipidemia; Essential hypertension, benign; Morbid obesity; Tinea versicolor; Lumbar back pain; and URI, acute on her problem list.     has No Known Allergies.  Ms. Kaylee Mitchell does not currently have medications on file.  Past Surgical History  Procedure Laterality Date  . Knee surgery      right-arthroscopy  . Mastectomy      right side  . Abdominal hysterectomy      Denies any headaches, dizziness, double vision, fevers, chills, night sweats, nausea, vomiting, diarrhea, constipation, chest pain, heart palpitations, shortness of breath, blood in stool, black tarry stool, urinary pain, urinary burning, urinary frequency, hematuria.   PHYSICAL EXAMINATION  ECOG PERFORMANCE STATUS: 0 - Asymptomatic  Filed Vitals:   05/08/14 0838  BP: 118/100  Pulse: 94  Temp: 98.1 F (36.7 C)  Resp: 20    GENERAL:alert, no distress, well nourished, well developed, comfortable, cooperative, obese and smiling SKIN: skin color, texture, turgor are normal, no rashes or significant lesions HEAD: Normocephalic, No masses, lesions, tenderness or abnormalities EYES: normal, PERRLA, EOMI, Conjunctiva are pink and non-injected EARS: External ears normal OROPHARYNX:lips, buccal mucosa, and tongue normal and mucous membranes are moist  NECK: supple, no adenopathy, thyroid normal size, non-tender, without nodularity, no stridor, non-tender, trachea midline LYMPH:  no palpable lymphadenopathy, no hepatosplenomegaly BREAST:not examined LUNGS: clear to auscultation and percussion HEART: regular rate & rhythm, no murmurs, no gallops, S1 normal and S2  normal ABDOMEN:abdomen soft, non-tender, obese, normal bowel sounds and no masses and organomegaly exam is hindered due to body habitus BACK: Back symmetric, no  curvature. EXTREMITIES:less then 2 second capillary refill, no joint deformities, effusion, or inflammation, no edema, no skin discoloration, no clubbing, no cyanosis  NEURO: alert & oriented x 3 with fluent speech, no focal motor/sensory deficits, gait normal   LABORATORY DATA: CBC    Component Value Date/Time   WBC 6.4 04/10/2014 0836   RBC 4.67 04/10/2014 0836   HGB 13.6 04/10/2014 0836   HCT 41.6 04/10/2014 0836   PLT 167 04/10/2014 0836   MCV 89.1 04/10/2014 0836   MCH 29.1 04/10/2014 0836   MCHC 32.7 04/10/2014 0836   RDW 13.8 04/10/2014 0836   LYMPHSABS 2.6 04/10/2014 0836   MONOABS 0.4 04/10/2014 0836   EOSABS 0.2 04/10/2014 0836   BASOSABS 0.0 04/10/2014 0836      Chemistry      Component Value Date/Time   NA 139 04/10/2014 0836   K 3.7 04/10/2014 0836   CL 106 04/10/2014 0836   CO2 26 04/10/2014 0836   BUN 27* 04/10/2014 0836   CREATININE 1.21* 04/10/2014 0836      Component Value Date/Time   CALCIUM 8.5 04/10/2014 0836   ALKPHOS 86 04/10/2014 0836   AST 23 04/10/2014 0836   ALT 17 04/10/2014 0836   BILITOT 0.5 04/10/2014 0836     Lab Results  Component Value Date   LABCA2 31.3 04/10/2014      ASSESSMENT AND PLAN:  Invasive ductal carcinoma of breast Stage IV Breast cancer with bone involvement requiring Xgeva.  On Faslodex and Aromasin with control of disease.  Recent PET scan demonstrates stable disease (01/24/2014) with improvement of bone metastases healing.    Labs every 4 weeks.    Continue Xgeva every 4 weeks, Faslodex every 4 weeks, and Aromasin daily.    Return in 3 months for follow-up.    Will continue with pain control.   Insurance company has denied her Oxycodone pain medication and I will pursue the appeal process for this as she has failed Hydrocodone.  In the interim, I will give her Vicodin until we get this sorted out.  If Insurance denies Oxycodone despite my efforts, I would not hesitate to switch her to a low-dose morphine or  maybe a long-acting formulation.   THERAPY PLAN:  NCCN guidelines recommends the following for monitoring of metastatic breast cancer:  A. Components of monitoring:   1. Monitoring includes periodic assessment of varied combinations of symptoms, physical examination, routine laboratory tests, imaging studies, and blood biomarkers where appropriate. Results of monitoring are classified as response/continued response to treatment, stable disease, uncertainty regarding disease status, or progression of disease. The clinician typically must assess and balance multiple different forms of information to make a determination regarding whether disease is being controlled and the toxicity of treatment is acceptable. Sometimes, this information may be contradictory.  B. Definition of disease progression:   1. Unequivocal evidence of progression of disease by one or more of these factors is required to establish progression of disease, either because of ineffective therapy or acquired resistance of disease to an applied therapy.  Progression of disease may be identified through evidence of growth or worsening of disease at previously known sites of disease and/or of the occurrence of new sites of metastatic disease.   2. Findings concerning for progression of disease include:    A. Worsening symptoms such as pain or dyspnea  B. Evidence of worsening or new disease on physical examination.    C. Declining performance status    D. Unexplained weight loss    E. Increasing Alkaline phosphatase, ALT, AST, or bilirubin    F. Hypercalcemia    G. New radiographic abnormality or increase in the size of pre-existing radiographic abnormality.    H. New areas of abnormality on functional imaging (eg, bone scan, PET/CT scan)    I. Increasing tumor markers (eg, CEA, CA 15-3, CA27.29)  All questions were answered. The patient knows to call the clinic with any problems, questions or concerns. We can certainly see the  patient much sooner if necessary.  Patient and plan discussed with Dr. Ancil Linsey and she is in agreement with the aforementioned.   This note is electronically signed by: Robynn Pane 05/08/2014 9:01 AM

## 2014-05-08 NOTE — Progress Notes (Signed)
Kaylee Mitchell presents today for injection per MD orders. Xgeva 120 mg administered SQ in left Abdomen. Administration without incident. Patient tolerated well.  Kaylee Mitchell presents today for injection per MD orders. Faslodex 500mg   administered IM ztrack into right and left gluteal. Administration without incident. Patient tolerated well.

## 2014-05-08 NOTE — Assessment & Plan Note (Addendum)
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  On Faslodex and Aromasin with control of disease.  Recent PET scan demonstrates stable disease (01/24/2014) with improvement of bone metastases healing.    Labs every 4 weeks.    Continue Xgeva every 4 weeks, Faslodex every 4 weeks, and Aromasin daily.    Return in 3 months for follow-up.    Will continue with pain control.   Insurance company has denied her Oxycodone pain medication and I will pursue the appeal process for this as she has failed Hydrocodone.  In the interim, I will give her Vicodin until we get this sorted out.  If Insurance denies Oxycodone despite my efforts, I would not hesitate to switch her to a low-dose morphine or maybe a long-acting formulation.

## 2014-05-09 LAB — CANCER ANTIGEN 27.29: CA 27.29: 37.1 U/mL (ref 0.0–38.6)

## 2014-05-15 ENCOUNTER — Encounter (HOSPITAL_COMMUNITY): Payer: Self-pay | Admitting: Oncology

## 2014-06-05 ENCOUNTER — Encounter (HOSPITAL_BASED_OUTPATIENT_CLINIC_OR_DEPARTMENT_OTHER): Payer: Medicare Other

## 2014-06-05 ENCOUNTER — Ambulatory Visit (HOSPITAL_COMMUNITY): Payer: Medicare Other | Admitting: Hematology & Oncology

## 2014-06-05 ENCOUNTER — Encounter (HOSPITAL_COMMUNITY): Payer: Medicare Other

## 2014-06-05 ENCOUNTER — Encounter (HOSPITAL_COMMUNITY): Payer: Medicare Other | Attending: Oncology

## 2014-06-05 ENCOUNTER — Encounter (HOSPITAL_COMMUNITY): Payer: Self-pay

## 2014-06-05 VITALS — BP 150/92 | HR 56 | Temp 98.3°F | Resp 18

## 2014-06-05 DIAGNOSIS — Z5112 Encounter for antineoplastic immunotherapy: Secondary | ICD-10-CM

## 2014-06-05 DIAGNOSIS — C50919 Malignant neoplasm of unspecified site of unspecified female breast: Secondary | ICD-10-CM | POA: Insufficient documentation

## 2014-06-05 DIAGNOSIS — C50911 Malignant neoplasm of unspecified site of right female breast: Secondary | ICD-10-CM | POA: Diagnosis present

## 2014-06-05 DIAGNOSIS — C7951 Secondary malignant neoplasm of bone: Secondary | ICD-10-CM | POA: Insufficient documentation

## 2014-06-05 LAB — COMPREHENSIVE METABOLIC PANEL
ALK PHOS: 86 U/L (ref 38–126)
ALT: 16 U/L (ref 14–54)
AST: 20 U/L (ref 15–41)
Albumin: 3.6 g/dL (ref 3.5–5.0)
Anion gap: 8 (ref 5–15)
BILIRUBIN TOTAL: 0.7 mg/dL (ref 0.3–1.2)
BUN: 24 mg/dL — AB (ref 6–20)
CHLORIDE: 100 mmol/L — AB (ref 101–111)
CO2: 28 mmol/L (ref 22–32)
CREATININE: 1.28 mg/dL — AB (ref 0.44–1.00)
Calcium: 9.7 mg/dL (ref 8.9–10.3)
GFR, EST AFRICAN AMERICAN: 52 mL/min — AB (ref >60)
GFR, EST NON AFRICAN AMERICAN: 45 mL/min — AB (ref >60)
Glucose, Bld: 232 mg/dL — ABNORMAL HIGH (ref 65–99)
Potassium: 4.2 mmol/L (ref 3.5–5.1)
Sodium: 136 mmol/L (ref 135–145)
Total Protein: 7.5 g/dL (ref 6.5–8.1)

## 2014-06-05 LAB — CBC WITH DIFFERENTIAL/PLATELET
Basophils Absolute: 0 10*3/uL (ref 0.0–0.1)
Basophils Relative: 0 % (ref 0–1)
Eosinophils Absolute: 0.2 10*3/uL (ref 0.0–0.7)
Eosinophils Relative: 3 % (ref 0–5)
HCT: 42.9 % (ref 36.0–46.0)
HEMOGLOBIN: 13.6 g/dL (ref 12.0–15.0)
LYMPHS PCT: 40 % (ref 12–46)
Lymphs Abs: 2.7 10*3/uL (ref 0.7–4.0)
MCH: 28.2 pg (ref 26.0–34.0)
MCHC: 31.7 g/dL (ref 30.0–36.0)
MCV: 89 fL (ref 78.0–100.0)
MONO ABS: 0.5 10*3/uL (ref 0.1–1.0)
MONOS PCT: 7 % (ref 3–12)
Neutro Abs: 3.5 10*3/uL (ref 1.7–7.7)
Neutrophils Relative %: 50 % (ref 43–77)
Platelets: 180 10*3/uL (ref 150–400)
RBC: 4.82 MIL/uL (ref 3.87–5.11)
RDW: 13.5 % (ref 11.5–15.5)
WBC: 6.9 10*3/uL (ref 4.0–10.5)

## 2014-06-05 MED ORDER — FULVESTRANT 250 MG/5ML IM SOLN
500.0000 mg | INTRAMUSCULAR | Status: DC
  Administered 2014-06-05: 500 mg via INTRAMUSCULAR
  Filled 2014-06-05: qty 10

## 2014-06-05 MED ORDER — OXYCODONE HCL 10 MG PO TABS: ORAL_TABLET | ORAL | Status: DC

## 2014-06-05 MED ORDER — DENOSUMAB 120 MG/1.7ML ~~LOC~~ SOLN
120.0000 mg | Freq: Once | SUBCUTANEOUS | Status: AC
  Administered 2014-06-05: 120 mg via SUBCUTANEOUS
  Filled 2014-06-05: qty 1.7

## 2014-06-05 NOTE — Progress Notes (Signed)
Kaylee Mitchell's reason for visit today is for an injection and labs as scheduled per MD orders.  Labs were drawn prior to administration of ordered medication.   Kaylee Mitchell also received faslodex and x-geva per MD orders; see [facility] for administration details.  Kaylee Mitchell tolerated all procedures well and without incident; questions were answered and patient was discharged.

## 2014-06-05 NOTE — Patient Instructions (Signed)
[place] at [facility]  Discharge Instructions:  faslodex and x-geva today Follow up as scheduled Call the clinic if you have any questions or concerns _______________________________________________________________  Thank you for choosing [place] at [facility] to provide your oncology and hematology care.  To afford each patient quality time with our providers, please arrive at least 15 minutes before your scheduled appointment.  You need to re-schedule your appointment if you arrive 10 or more minutes late.  We strive to give you quality time with our providers, and arriving late affects you and other patients whose appointments are after yours.  Also, if you no show three or more times for appointments you may be dismissed from the clinic.  Again, thank you for choosing [place] at [place] hope is that these requests will allow you access to exceptional care and in a timely manner. _______________________________________________________________  If you have questions after your visit, please contact our office at [phone] between the hours of 8:30 a.m. and 5:00 p.m. Voicemails left after 4:30 p.m. will not be returned until the following business day. _______________________________________________________________  For prescription refill requests, have your pharmacy contact our office. _______________________________________________________________  Recommendations made by the consultant and any test results will be sent to your referring physician. _______________________________________________________________

## 2014-06-05 NOTE — Progress Notes (Signed)
Labs drawn

## 2014-06-06 LAB — CANCER ANTIGEN 27.29: CA 27.29: 31.9 U/mL (ref 0.0–38.6)

## 2014-06-19 ENCOUNTER — Other Ambulatory Visit (HOSPITAL_COMMUNITY): Payer: Self-pay

## 2014-06-19 DIAGNOSIS — C7951 Secondary malignant neoplasm of bone: Secondary | ICD-10-CM

## 2014-06-19 DIAGNOSIS — C50919 Malignant neoplasm of unspecified site of unspecified female breast: Secondary | ICD-10-CM

## 2014-06-20 DIAGNOSIS — C7951 Secondary malignant neoplasm of bone: Secondary | ICD-10-CM | POA: Diagnosis not present

## 2014-06-20 DIAGNOSIS — E1042 Type 1 diabetes mellitus with diabetic polyneuropathy: Secondary | ICD-10-CM | POA: Diagnosis not present

## 2014-06-20 DIAGNOSIS — C50911 Malignant neoplasm of unspecified site of right female breast: Secondary | ICD-10-CM | POA: Diagnosis not present

## 2014-06-30 ENCOUNTER — Other Ambulatory Visit (HOSPITAL_COMMUNITY): Payer: Self-pay | Admitting: Hematology & Oncology

## 2014-07-03 ENCOUNTER — Encounter (HOSPITAL_COMMUNITY): Payer: Medicare Other | Attending: Oncology

## 2014-07-03 ENCOUNTER — Encounter (HOSPITAL_COMMUNITY): Payer: Self-pay

## 2014-07-03 ENCOUNTER — Encounter (HOSPITAL_BASED_OUTPATIENT_CLINIC_OR_DEPARTMENT_OTHER): Payer: Medicare Other

## 2014-07-03 VITALS — BP 161/89 | HR 90 | Temp 98.3°F | Resp 18

## 2014-07-03 DIAGNOSIS — Z5111 Encounter for antineoplastic chemotherapy: Secondary | ICD-10-CM

## 2014-07-03 DIAGNOSIS — C7951 Secondary malignant neoplasm of bone: Secondary | ICD-10-CM | POA: Diagnosis not present

## 2014-07-03 DIAGNOSIS — C50919 Malignant neoplasm of unspecified site of unspecified female breast: Secondary | ICD-10-CM | POA: Diagnosis not present

## 2014-07-03 DIAGNOSIS — C50911 Malignant neoplasm of unspecified site of right female breast: Secondary | ICD-10-CM

## 2014-07-03 DIAGNOSIS — C50211 Malignant neoplasm of upper-inner quadrant of right female breast: Secondary | ICD-10-CM | POA: Diagnosis present

## 2014-07-03 LAB — COMPREHENSIVE METABOLIC PANEL
ALK PHOS: 78 U/L (ref 38–126)
ALT: 21 U/L (ref 14–54)
ANION GAP: 8 (ref 5–15)
AST: 21 U/L (ref 15–41)
Albumin: 3.7 g/dL (ref 3.5–5.0)
BUN: 25 mg/dL — AB (ref 6–20)
CO2: 29 mmol/L (ref 22–32)
Calcium: 9.6 mg/dL (ref 8.9–10.3)
Chloride: 102 mmol/L (ref 101–111)
Creatinine, Ser: 1.21 mg/dL — ABNORMAL HIGH (ref 0.44–1.00)
GFR, EST AFRICAN AMERICAN: 55 mL/min — AB (ref >60)
GFR, EST NON AFRICAN AMERICAN: 48 mL/min — AB (ref >60)
GLUCOSE: 253 mg/dL — AB (ref 65–99)
POTASSIUM: 4.3 mmol/L (ref 3.5–5.1)
Sodium: 139 mmol/L (ref 135–145)
TOTAL PROTEIN: 7.9 g/dL (ref 6.5–8.1)
Total Bilirubin: 0.8 mg/dL (ref 0.3–1.2)

## 2014-07-03 LAB — CBC WITH DIFFERENTIAL/PLATELET
Basophils Absolute: 0 10*3/uL (ref 0.0–0.1)
Basophils Relative: 1 % (ref 0–1)
EOS ABS: 0.2 10*3/uL (ref 0.0–0.7)
Eosinophils Relative: 4 % (ref 0–5)
HEMATOCRIT: 42.6 % (ref 36.0–46.0)
HEMOGLOBIN: 13.7 g/dL (ref 12.0–15.0)
Lymphocytes Relative: 42 % (ref 12–46)
Lymphs Abs: 2.6 10*3/uL (ref 0.7–4.0)
MCH: 28.8 pg (ref 26.0–34.0)
MCHC: 32.2 g/dL (ref 30.0–36.0)
MCV: 89.5 fL (ref 78.0–100.0)
MONOS PCT: 6 % (ref 3–12)
Monocytes Absolute: 0.4 10*3/uL (ref 0.1–1.0)
NEUTROS ABS: 3 10*3/uL (ref 1.7–7.7)
NEUTROS PCT: 47 % (ref 43–77)
Platelets: 176 10*3/uL (ref 150–400)
RBC: 4.76 MIL/uL (ref 3.87–5.11)
RDW: 13.4 % (ref 11.5–15.5)
WBC: 6.3 10*3/uL (ref 4.0–10.5)

## 2014-07-03 MED ORDER — DENOSUMAB 120 MG/1.7ML ~~LOC~~ SOLN
120.0000 mg | Freq: Once | SUBCUTANEOUS | Status: AC
Start: 2014-07-03 — End: 2014-07-03
  Administered 2014-07-03: 120 mg via SUBCUTANEOUS
  Filled 2014-07-03: qty 1.7

## 2014-07-03 MED ORDER — OXYCODONE HCL 10 MG PO TABS
ORAL_TABLET | ORAL | Status: DC
Start: 2014-07-03 — End: 2014-08-01

## 2014-07-03 MED ORDER — FULVESTRANT 250 MG/5ML IM SOLN
500.0000 mg | INTRAMUSCULAR | Status: DC
  Administered 2014-07-03: 500 mg via INTRAMUSCULAR
  Filled 2014-07-03: qty 10

## 2014-07-03 NOTE — Progress Notes (Signed)
Kaylee Mitchell presents today for injection per the provider's orders.  Xgeva and Faslodex administration without incident; see MAR for injection details.  Patient tolerated procedure well and without incident.  No questions or complaints noted at this time.

## 2014-07-03 NOTE — Patient Instructions (Signed)
.  [place] at [facility] Discharge Instructions  RECOMMENDATIONS MADE BY THE CONSULTANT AND ANY TEST RESULTS WILL BE SENT TO YOUR REFERRING PHYSICIAN.  faslodex and x-geva today Follow up as scheduled Please call the clinic if you have any questions or concerns  Thank you for choosing [place] at [facility] to provide your oncology and hematology care.  To afford each patient quality time with our provider, please arrive at least 15 minutes before your scheduled appointment time.    You need to re-schedule your appointment should you arrive 10 or more minutes late.  We strive to give you quality time with our providers, and arriving late affects you and other patients whose appointments are after yours.  Also, if you no show three or more times for appointments you may be dismissed from the clinic at the providers discretion.     Again, thank you for choosing [facility].  Our hope is that these requests will decrease the amount of time that you wait before being seen by our physicians.       _____________________________________________________________  Should you have questions after your visit to [facility], please contact our office at [phone] between the hours of 8:30 a.m. and 4:30 p.m.  Voicemails left after 4:30 p.m. will not be returned until the following business day.  For prescription refill requests, have your pharmacy contact our office.

## 2014-07-04 LAB — CANCER ANTIGEN 27.29: CA 27.29: 51.2 U/mL — AB (ref 0.0–38.6)

## 2014-07-04 NOTE — Progress Notes (Signed)
Lab draw

## 2014-07-25 ENCOUNTER — Other Ambulatory Visit (HOSPITAL_COMMUNITY): Payer: Self-pay

## 2014-07-31 ENCOUNTER — Encounter (HOSPITAL_COMMUNITY): Payer: Medicare Other

## 2014-07-31 ENCOUNTER — Ambulatory Visit (HOSPITAL_COMMUNITY): Payer: Medicare Other | Admitting: Hematology & Oncology

## 2014-08-01 ENCOUNTER — Encounter (HOSPITAL_COMMUNITY): Payer: Medicare Other

## 2014-08-01 ENCOUNTER — Encounter (HOSPITAL_COMMUNITY): Payer: Self-pay | Admitting: Hematology & Oncology

## 2014-08-01 ENCOUNTER — Encounter (HOSPITAL_BASED_OUTPATIENT_CLINIC_OR_DEPARTMENT_OTHER): Payer: Medicare Other

## 2014-08-01 ENCOUNTER — Encounter (HOSPITAL_COMMUNITY): Payer: Self-pay

## 2014-08-01 ENCOUNTER — Encounter (HOSPITAL_COMMUNITY): Payer: Medicare Other | Attending: Oncology | Admitting: Hematology & Oncology

## 2014-08-01 VITALS — BP 151/92 | HR 96 | Temp 97.6°F | Resp 18 | Wt 299.0 lb

## 2014-08-01 DIAGNOSIS — C773 Secondary and unspecified malignant neoplasm of axilla and upper limb lymph nodes: Secondary | ICD-10-CM

## 2014-08-01 DIAGNOSIS — M79606 Pain in leg, unspecified: Secondary | ICD-10-CM | POA: Diagnosis not present

## 2014-08-01 DIAGNOSIS — C7951 Secondary malignant neoplasm of bone: Secondary | ICD-10-CM | POA: Diagnosis not present

## 2014-08-01 DIAGNOSIS — C50211 Malignant neoplasm of upper-inner quadrant of right female breast: Secondary | ICD-10-CM

## 2014-08-01 DIAGNOSIS — Z5111 Encounter for antineoplastic chemotherapy: Secondary | ICD-10-CM | POA: Diagnosis not present

## 2014-08-01 DIAGNOSIS — C50919 Malignant neoplasm of unspecified site of unspecified female breast: Secondary | ICD-10-CM | POA: Diagnosis not present

## 2014-08-01 DIAGNOSIS — G8929 Other chronic pain: Secondary | ICD-10-CM

## 2014-08-01 LAB — CBC WITH DIFFERENTIAL/PLATELET
BASOS PCT: 0 % (ref 0–1)
Basophils Absolute: 0 10*3/uL (ref 0.0–0.1)
Eosinophils Absolute: 0.2 10*3/uL (ref 0.0–0.7)
Eosinophils Relative: 4 % (ref 0–5)
HCT: 41 % (ref 36.0–46.0)
HEMOGLOBIN: 13.2 g/dL (ref 12.0–15.0)
Lymphocytes Relative: 33 % (ref 12–46)
Lymphs Abs: 2.2 10*3/uL (ref 0.7–4.0)
MCH: 28.4 pg (ref 26.0–34.0)
MCHC: 32.2 g/dL (ref 30.0–36.0)
MCV: 88.4 fL (ref 78.0–100.0)
Monocytes Absolute: 0.4 10*3/uL (ref 0.1–1.0)
Monocytes Relative: 6 % (ref 3–12)
Neutro Abs: 3.9 10*3/uL (ref 1.7–7.7)
Neutrophils Relative %: 57 % (ref 43–77)
Platelets: 170 10*3/uL (ref 150–400)
RBC: 4.64 MIL/uL (ref 3.87–5.11)
RDW: 13.5 % (ref 11.5–15.5)
WBC: 6.8 10*3/uL (ref 4.0–10.5)

## 2014-08-01 LAB — COMPREHENSIVE METABOLIC PANEL
ALT: 24 U/L (ref 14–54)
ANION GAP: 9 (ref 5–15)
AST: 29 U/L (ref 15–41)
Albumin: 3.7 g/dL (ref 3.5–5.0)
Alkaline Phosphatase: 89 U/L (ref 38–126)
BUN: 25 mg/dL — ABNORMAL HIGH (ref 6–20)
CO2: 25 mmol/L (ref 22–32)
CREATININE: 1.16 mg/dL — AB (ref 0.44–1.00)
Calcium: 8.7 mg/dL — ABNORMAL LOW (ref 8.9–10.3)
Chloride: 104 mmol/L (ref 101–111)
GFR calc Af Amer: 58 mL/min — ABNORMAL LOW (ref >60)
GFR calc non Af Amer: 50 mL/min — ABNORMAL LOW (ref >60)
GLUCOSE: 183 mg/dL — AB (ref 65–99)
POTASSIUM: 4.2 mmol/L (ref 3.5–5.1)
Sodium: 138 mmol/L (ref 135–145)
Total Bilirubin: 0.7 mg/dL (ref 0.3–1.2)
Total Protein: 7.2 g/dL (ref 6.5–8.1)

## 2014-08-01 MED ORDER — OXYCODONE HCL 10 MG PO TABS: ORAL_TABLET | ORAL | Status: DC

## 2014-08-01 MED ORDER — DENOSUMAB 120 MG/1.7ML ~~LOC~~ SOLN
120.0000 mg | Freq: Once | SUBCUTANEOUS | Status: DC
  Filled 2014-08-01: qty 1.7

## 2014-08-01 MED ORDER — FULVESTRANT 250 MG/5ML IM SOLN
500.0000 mg | INTRAMUSCULAR | Status: DC
  Administered 2014-08-01: 500 mg via INTRAMUSCULAR
  Filled 2014-08-01: qty 10

## 2014-08-01 NOTE — Progress Notes (Signed)
Labs drawn

## 2014-08-01 NOTE — Progress Notes (Signed)
[place], [place] [address]  Stage IV breast cancer on presentation, bone only  Iinvasive ductal carcinoma of the Right breast. 7 cm primary tumor, extensive LVI. With focal extenion to the anterior surface margin, 8 of 8 lymph nodes were involved with metastatic disease.    ER positive 100%, PR positive 99%, HER2 neu negative, Ki-67 marker 14%.   Mastectomy followed by radiation therapy to T4 through T7 due to severe involvement at this area.  Arimidex initially from 07/2009 through 03/23/2010 and was progressing by bone scan criteria. We switched her to tamoxifen.   Because of worsening bone pain, MRI of lumbar spine completed on 11/29/2012 demonstrated progressive diffuse lumbar spine metastatic disease and therefore therapy was changed to Fulvestrant and Exemestane on 12/11/2012, tolerating well except for vasomotor instability   CURRENT THERAPY: Faslodex/aromasin/Xgeva  INTERVAL HISTORY: Kaylee Mitchell 59 y.o. female returns for follow-up of her breast cancer, stage IV disease. Last imaging was in January and showed stable bone metastases. She continues on XGEVA, faslodex and aromasin.  The patient is alone today and says that she feels good. Her appetite however, is not desirable.  She states that it is too hot outside and she "just doesn't feel like eating". She takes her pain medication as needed, sometimes 2-3 times per day.  All of her pain is persistent.  She denies any swelling in her legs and says that she tolerates her "shots" well.  MEDICAL HISTORY: Past Medical History  Diagnosis Date  . Hypertension   . High cholesterol   . Knee pain, bilateral 2013  . Cancer     rt breast is primary  . Bone metastasis 09/13/2010  . Diabetes mellitus     type II  . Depression   . Depression 12/14/2011  . Invasive ductal carcinoma of breast 09/13/2010    has Invasive ductal carcinoma of breast; Bone metastasis; Medial meniscus,  posterior horn derangement; S/P arthroscopy of left knee; Difficulty in walking(719.7); Depression; Diabetes mellitus with chronic kidney disease; CKD (chronic kidney disease); Hyperlipidemia; Essential hypertension, benign; Morbid obesity; Tinea versicolor; Lumbar back pain; and URI, acute on her problem list.      Invasive ductal carcinoma of breast   05/26/2009 Initial Diagnosis Needle core biopsy showed invasive mammary carcinoma with 1 + lymph node   06/03/2009 Surgery Right modified radical mastectomy with 8/8 + lymph nodes by Dr. Ronnell [place]   07/22/2009 Cancer Staging Bone scan + for multiple osseous lesions.  No disease elsewhere.   07/24/2009 - 03/23/2010 Chemotherapy Arimidex   03/23/2010 Progression    03/23/2010 - 12/05/2012 Chemotherapy Tamoxifen   11/28/2012 Progression Worsening of lumbar spine osseous disease   12/06/2012 -  Chemotherapy Fulvestrant + Exemestane   01/24/2014 PET scan No evidence of hypermetabolic recurrent or metastatic disease. Widespread osseous metastasis, without significant marrow hypermetabolism.     has No Known Allergies.  [name] had no medications administered during this visit.  SURGICAL HISTORY: Past Surgical History  Procedure Laterality Date  . Knee surgery      right-arthroscopy  . Mastectomy      right side  . Abdominal hysterectomy      SOCIAL HISTORY: History   Social History  . Marital Status: Single    Spouse Name: N/A  . Number of Children: N/A  . Years of Education: 12   Occupational History  . disabled    Social History Main Topics  . Smoking status: Never Smoker   .  Smokeless tobacco: Never Used  . Alcohol Use: No  . Drug Use: No  . Sexual Activity: Yes    Birth Control/ Protection: Surgical   Other Topics Concern  . Not on file   Social History Narrative    FAMILY HISTORY: Family History  Problem Relation Age of Onset  . Arthritis    . Cancer    . Diabetes    . Anesthesia problems Neg Hx   . Hypotension Neg  Hx   . Malignant hyperthermia Neg Hx   . Pseudochol deficiency Neg Hx   . Diabetes Mother   . Diabetes Father   . Cancer Maternal Aunt     Review of Systems  Constitutional: Negative for weight loss and malaise/fatigue. Negative for fever, chills and diaphoresis.  HENT: Negative for congestion, ear discharge, ear pain, hearing loss, nosebleeds, sore throat and tinnitus.   Eyes: Negative.   Respiratory: Negative.  Negative for stridor.   Cardiovascular: Negative.   Gastrointestinal: Negative.   Genitourinary: Negative.   Musculoskeletal:       Chronic bilateral leg pain  Skin: Negative.   Neurological: Negative for dizziness, tingling, tremors, sensory change, speech change, focal weakness, seizures, headaches, loss of consciousness and weakness.  Endo/Heme/Allergies: Negative.   Psychiatric/Behavioral: Negative.   14 point review of systems was performed and is negative except as detailed under history of present illness and above  PHYSICAL EXAMINATION  ECOG PERFORMANCE STATUS: 1 - Symptomatic but completely ambulatory  Filed Vitals:   08/01/14 0834  BP: 151/92  Pulse: 96  Temp: 97.6 F (36.4 C)  Resp: 18    Physical Exam  Constitutional: She is oriented to person, place, and time and well-developed, well-nourished, and in no distress.  Obese  HENT:  Head: Normocephalic and atraumatic.  Nose: Nose normal.  Mouth/Throat: Oropharynx is clear and moist. No oropharyngeal exudate.  Eyes: Conjunctivae and EOM are normal. Pupils are equal, round, and reactive to light. Right eye exhibits no discharge. Left eye exhibits no discharge. No scleral icterus.  Neck: Normal range of motion. Neck supple. No tracheal deviation present. No thyromegaly present.  Cardiovascular: Normal rate, regular rhythm and normal heart sounds.  Exam reveals no gallop and no friction rub.   No murmur heard. Pulmonary/Chest: Effort normal and breath sounds normal. She has no wheezes. She has no rales.    Left chest wall with prior mastectomy, incision intact, no nodularity or concerning skin changes  Abdominal: Soft. Bowel sounds are normal. She exhibits no distension and no mass. There is no tenderness. There is no rebound and no guarding.  Musculoskeletal: Normal range of motion. She exhibits no edema.  Lymphadenopathy:    She has no cervical adenopathy.  Neurological: She is alert and oriented to person, place, and time. She has normal reflexes. No cranial nerve deficit. Gait normal. Coordination normal.  Skin: Skin is warm and dry. No rash noted.  Psychiatric: Mood, memory, affect and judgment normal.  Nursing note and vitals reviewed.   LABORATORY DATA:  CBC    Component Value Date/Time   WBC 6.8 08/01/2014 0843   RBC 4.64 08/01/2014 0843   HGB 13.2 08/01/2014 0843   HCT 41.0 08/01/2014 0843   PLT 170 08/01/2014 0843   MCV 88.4 08/01/2014 0843   MCH 28.4 08/01/2014 0843   MCHC 32.2 08/01/2014 0843   RDW 13.5 08/01/2014 0843   LYMPHSABS 2.2 08/01/2014 0843   MONOABS 0.4 08/01/2014 0843   EOSABS 0.2 08/01/2014 0843   BASOSABS 0.0 08/01/2014  0843   CMP     Component Value Date/Time   NA 138 08/01/2014 0843   K 4.2 08/01/2014 0843   CL 104 08/01/2014 0843   CO2 25 08/01/2014 0843   GLUCOSE 183* 08/01/2014 0843   BUN 25* 08/01/2014 0843   CREATININE 1.16* 08/01/2014 0843   CALCIUM 8.7* 08/01/2014 0843   PROT 7.2 08/01/2014 0843   ALBUMIN 3.7 08/01/2014 0843   AST 29 08/01/2014 0843   ALT 24 08/01/2014 0843   ALKPHOS 89 08/01/2014 0843   BILITOT 0.7 08/01/2014 0843   GFRNONAA 50* 08/01/2014 0843   GFRAA 58* 08/01/2014 0843     ASSESSMENT and THERAPY PLAN:  Stage IV ER+ carcinoma of the breast with bone metastases Chronic Leg pain Low calcium on XGEVA  She was advised to increase her calcium to twice daily. Her Delton See will be held today secondary to her calcium level. We will continue with Faslodex she is to continue on her Aromasin daily.  She is  realistically doing very well. Her mood is good. Her performance status is excellent. Weight is overall stable. We will continue with monthly follow-ups.  Pain medication was refilled. Orders Placed This Encounter  Procedures  . Cancer antigen 27.29    Standing Status: Future     Number of Occurrences:      Standing Expiration Date: 08/01/2015  . Cancer antigen 15-3    Standing Status: Future     Number of Occurrences:      Standing Expiration Date: 08/01/2015  . CBC with Differential    Standing Status: Future     Number of Occurrences:      Standing Expiration Date: 08/01/2015  . Comprehensive metabolic panel    Standing Status: Future     Number of Occurrences:      Standing Expiration Date: 08/01/2015    All questions were answered. The patient knows to call the clinic with any problems, questions or concerns. We can certainly see the patient much sooner if necessary.   This document serves as a record of services personally performed by Ancil Linsey, MD. It was created on her behalf by Janace Hoard, a trained medical scribe. The creation of this record is based on the scribe's personal observations and the provider's statements to them. This document has been checked and approved by the attending provider.  I have reviewed the above documentation for accuracy and completeness, and I agree with the above.  This note was electronically signed.  Kelby Fam. Whitney Muse, MD

## 2014-08-01 NOTE — Patient Instructions (Signed)
[place] at [facility] Discharge Instructions  RECOMMENDATIONS MADE BY THE CONSULTANT AND ANY TEST RESULTS WILL BE SENT TO YOUR REFERRING PHYSICIAN.  faslodex and x-geva today Return as scheduled Please call the clinic if you have any questions or concerns  Thank you for choosing [place] at [facility] to provide your oncology and hematology care.  To afford each patient quality time with our provider, please arrive at least 15 minutes before your scheduled appointment time.    You need to re-schedule your appointment should you arrive 10 or more minutes late.  We strive to give you quality time with our providers, and arriving late affects you and other patients whose appointments are after yours.  Also, if you no show three or more times for appointments you may be dismissed from the clinic at the providers discretion.     Again, thank you for choosing [facility].  Our hope is that these requests will decrease the amount of time that you wait before being seen by our physicians.       _____________________________________________________________  Should you have questions after your visit to [facility], please contact our office at [phone] between the hours of 8:30 a.m. and 4:30 p.m.  Voicemails left after 4:30 p.m. will not be returned until the following business day.  For prescription refill requests, have your pharmacy contact our office.

## 2014-08-01 NOTE — Patient Instructions (Addendum)
[place] at [facility] Discharge Instructions  RECOMMENDATIONS MADE BY THE CONSULTANT AND ANY TEST RESULTS WILL BE SENT TO YOUR REFERRING PHYSICIAN.  Exam and discussion by Dr. Whitney Muse. No xgeva today.  Your calcium level is too low. Will give Faslodex today. Refill for oxycodone Report any new lumps, bone pain, shortness of breath or other symptoms.  Follow-up in 1 month with labs office visit and injection.    Thank you for choosing [place] at [facility] to provide your oncology and hematology care.  To afford each patient quality time with our provider, please arrive at least 15 minutes before your scheduled appointment time.    You need to re-schedule your appointment should you arrive 10 or more minutes late.  We strive to give you quality time with our providers, and arriving late affects you and other patients whose appointments are after yours.  Also, if you no show three or more times for appointments you may be dismissed from the clinic at the providers discretion.     Again, thank you for choosing [facility]  Our hope is that these requests will decrease the amount of time that you wait before being seen by our physicians.       _____________________________________________________________  Should you have questions after your visit to [facility], please contact our office at [phone] between the hours of 8:30 a.m. and 4:30 p.m.  Voicemails left after 4:30 p.m. will not be returned until the following business day.  For prescription refill requests, have your pharmacy contact our office.

## 2014-08-01 NOTE — Progress Notes (Signed)
Kaylee Mitchell presents today for injection per MD orders. Faslodex 500 mg  administered IM ztrack in divided doses in right and left gluteal . Administration without incident. Patient tolerated well.  Xgeva held due to low calcium per Kaylee Mitchell.

## 2014-08-02 LAB — CANCER ANTIGEN 27.29: CA 27.29: 48.4 U/mL — ABNORMAL HIGH (ref 0.0–38.6)

## 2014-08-07 ENCOUNTER — Ambulatory Visit (HOSPITAL_COMMUNITY): Payer: Medicare Other | Admitting: Hematology & Oncology

## 2014-08-11 DIAGNOSIS — I10 Essential (primary) hypertension: Secondary | ICD-10-CM | POA: Diagnosis not present

## 2014-08-11 DIAGNOSIS — E1042 Type 1 diabetes mellitus with diabetic polyneuropathy: Secondary | ICD-10-CM | POA: Diagnosis not present

## 2014-08-11 DIAGNOSIS — E785 Hyperlipidemia, unspecified: Secondary | ICD-10-CM | POA: Diagnosis not present

## 2014-08-19 DIAGNOSIS — C7951 Secondary malignant neoplasm of bone: Secondary | ICD-10-CM | POA: Diagnosis not present

## 2014-08-19 DIAGNOSIS — C50911 Malignant neoplasm of unspecified site of right female breast: Secondary | ICD-10-CM | POA: Diagnosis not present

## 2014-08-19 DIAGNOSIS — E1042 Type 1 diabetes mellitus with diabetic polyneuropathy: Secondary | ICD-10-CM | POA: Diagnosis not present

## 2014-08-21 DIAGNOSIS — S80851A Superficial foreign body, right lower leg, initial encounter: Secondary | ICD-10-CM | POA: Diagnosis not present

## 2014-08-21 DIAGNOSIS — E1151 Type 2 diabetes mellitus with diabetic peripheral angiopathy without gangrene: Secondary | ICD-10-CM | POA: Diagnosis not present

## 2014-08-21 DIAGNOSIS — E1142 Type 2 diabetes mellitus with diabetic polyneuropathy: Secondary | ICD-10-CM | POA: Diagnosis not present

## 2014-08-26 ENCOUNTER — Encounter (HOSPITAL_COMMUNITY): Payer: Self-pay | Admitting: Hematology & Oncology

## 2014-08-29 ENCOUNTER — Encounter (HOSPITAL_COMMUNITY): Payer: Medicare Other | Attending: Oncology | Admitting: Hematology & Oncology

## 2014-08-29 ENCOUNTER — Encounter (HOSPITAL_BASED_OUTPATIENT_CLINIC_OR_DEPARTMENT_OTHER): Payer: Medicare Other

## 2014-08-29 ENCOUNTER — Other Ambulatory Visit: Payer: Self-pay

## 2014-08-29 ENCOUNTER — Encounter (HOSPITAL_COMMUNITY): Payer: Medicare Other

## 2014-08-29 ENCOUNTER — Ambulatory Visit (HOSPITAL_COMMUNITY)
Admission: RE | Admit: 2014-08-29 | Discharge: 2014-08-29 | Disposition: A | Discharge status: Home / Self Care | Payer: Medicare Other | Source: Ambulatory Visit | Attending: Hematology & Oncology | Admitting: Hematology & Oncology

## 2014-08-29 VITALS — BP 143/88 | HR 90 | Temp 98.1°F | Resp 20 | Wt 297.0 lb

## 2014-08-29 DIAGNOSIS — Z1239 Encounter for other screening for malignant neoplasm of breast: Secondary | ICD-10-CM

## 2014-08-29 DIAGNOSIS — C50211 Malignant neoplasm of upper-inner quadrant of right female breast: Secondary | ICD-10-CM

## 2014-08-29 DIAGNOSIS — C50919 Malignant neoplasm of unspecified site of unspecified female breast: Secondary | ICD-10-CM | POA: Diagnosis not present

## 2014-08-29 DIAGNOSIS — C773 Secondary and unspecified malignant neoplasm of axilla and upper limb lymph nodes: Secondary | ICD-10-CM | POA: Diagnosis not present

## 2014-08-29 DIAGNOSIS — M79604 Pain in right leg: Secondary | ICD-10-CM | POA: Diagnosis not present

## 2014-08-29 DIAGNOSIS — C7951 Secondary malignant neoplasm of bone: Secondary | ICD-10-CM

## 2014-08-29 DIAGNOSIS — M79661 Pain in right lower leg: Secondary | ICD-10-CM | POA: Diagnosis not present

## 2014-08-29 DIAGNOSIS — Z5111 Encounter for antineoplastic chemotherapy: Secondary | ICD-10-CM | POA: Diagnosis present

## 2014-08-29 LAB — COMPREHENSIVE METABOLIC PANEL
ALK PHOS: 92 U/L (ref 38–126)
ALT: 24 U/L (ref 14–54)
AST: 27 U/L (ref 15–41)
Albumin: 3.7 g/dL (ref 3.5–5.0)
Anion gap: 8 (ref 5–15)
BUN: 25 mg/dL — ABNORMAL HIGH (ref 6–20)
CO2: 26 mmol/L (ref 22–32)
Calcium: 10 mg/dL (ref 8.9–10.3)
Chloride: 104 mmol/L (ref 101–111)
Creatinine, Ser: 1.34 mg/dL — ABNORMAL HIGH (ref 0.44–1.00)
GFR calc non Af Amer: 42 mL/min — ABNORMAL LOW (ref >60)
GFR, EST AFRICAN AMERICAN: 49 mL/min — AB (ref >60)
Glucose, Bld: 85 mg/dL (ref 65–99)
POTASSIUM: 4.4 mmol/L (ref 3.5–5.1)
SODIUM: 138 mmol/L (ref 135–145)
Total Bilirubin: 0.7 mg/dL (ref 0.3–1.2)
Total Protein: 7.7 g/dL (ref 6.5–8.1)

## 2014-08-29 LAB — CBC WITH DIFFERENTIAL/PLATELET
Basophils Absolute: 0.1 10*3/uL (ref 0.0–0.1)
Basophils Relative: 1 % (ref 0–1)
EOS ABS: 0.2 10*3/uL (ref 0.0–0.7)
EOS PCT: 3 % (ref 0–5)
HEMATOCRIT: 41.8 % (ref 36.0–46.0)
Hemoglobin: 13.4 g/dL (ref 12.0–15.0)
LYMPHS ABS: 2.9 10*3/uL (ref 0.7–4.0)
LYMPHS PCT: 36 % (ref 12–46)
MCH: 28.7 pg (ref 26.0–34.0)
MCHC: 32.1 g/dL (ref 30.0–36.0)
MCV: 89.5 fL (ref 78.0–100.0)
MONOS PCT: 6 % (ref 3–12)
Monocytes Absolute: 0.5 10*3/uL (ref 0.1–1.0)
Neutro Abs: 4.4 10*3/uL (ref 1.7–7.7)
Neutrophils Relative %: 54 % (ref 43–77)
Platelets: 178 10*3/uL (ref 150–400)
RBC: 4.67 MIL/uL (ref 3.87–5.11)
RDW: 13.6 % (ref 11.5–15.5)
WBC: 8.1 10*3/uL (ref 4.0–10.5)

## 2014-08-29 LAB — CANCER ANTIGEN 15-3: CANCER ANTIGEN-BREAST 15-3: 40 U/mL — AB (ref <32)

## 2014-08-29 MED ORDER — OXYCODONE HCL 10 MG PO TABS: ORAL_TABLET | ORAL | Status: DC

## 2014-08-29 MED ORDER — DENOSUMAB 120 MG/1.7ML ~~LOC~~ SOLN
120.0000 mg | Freq: Once | SUBCUTANEOUS | Status: AC
  Administered 2014-08-29: 120 mg via SUBCUTANEOUS
  Filled 2014-08-29: qty 1.7

## 2014-08-29 MED ORDER — FULVESTRANT 250 MG/5ML IM SOLN
500.0000 mg | INTRAMUSCULAR | Status: DC
  Administered 2014-08-29: 500 mg via INTRAMUSCULAR
  Filled 2014-08-29: qty 10

## 2014-08-29 NOTE — Progress Notes (Signed)
Kaylee Mitchell presents today for injection per MD orders. XGEVA 120 administered SQ in right Abdomen. Administration without incident. Patient tolerated well.  Kaylee Mitchell presents today for injection per MD orders. FASLODEX administered IM in RT and LT upper outer buttocks Administration without incident. Patient tolerated well.

## 2014-08-29 NOTE — Patient Instructions (Addendum)
[place] at [facility] Discharge Instructions  RECOMMENDATIONS MADE BY THE CONSULTANT AND ANY TEST RESULTS WILL BE SENT TO YOUR REFERRING PHYSICIAN.  PET scan coming up: Don't eat or drink 6 hours prior to PET scan, no diabetic meds or insulin 6 hours prior to test either  Labs in 1 month along with XGEVA and Faslodex/MD visit  Faslodex & XGEVA given today, EKG done today due to irregular heart beat but Dr. Whitney Muse said EKG was fine  Urine test today  Ultrasound of rt lower extremity to check for blood clot which revealed no blood clot.   Thank you for choosing [place] at [facility] to provide your oncology and hematology care.  To afford each patient quality time with our provider, please arrive at least 15 minutes before your scheduled appointment time.    You need to re-schedule your appointment should you arrive 10 or more minutes late.  We strive to give you quality time with our providers, and arriving late affects you and other patients whose appointments are after yours.  Also, if you no show three or more times for appointments you may be dismissed from the clinic at the providers discretion.     Again, thank you for choosing [facility].  Our hope is that these requests will decrease the amount of time that you wait before being seen by our physicians.       _____________________________________________________________  Should you have questions after your visit to [facility], please contact our office at [phone] between the hours of 8:30 a.m. and 4:30 p.m.  Voicemails left after 4:30 p.m. will not be returned until the following business day.  For prescription refill requests, have your pharmacy contact our office.

## 2014-08-29 NOTE — Progress Notes (Signed)
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 7 cm primary tumor, extensive LVI. With focal extenion to the anterior surface margin, 8 of 8 lymph nodes were involved with metastatic disease.    ER positive 100%, PR positive 99%, HER2 neu negative, Ki-67 marker 14%.   Mastectomy followed by radiation therapy to T4 through T7 due to severe involvement at this area.  Arimidex initially from 07/2009 through 03/23/2010 and was progressing by bone scan criteria. We switched her to tamoxifen.   Because of worsening bone pain, MRI of lumbar spine completed on 11/29/2012 demonstrated progressive diffuse lumbar spine metastatic disease and therefore therapy was changed to Fulvestrant and Exemestane on 12/11/2012, tolerating well except for vasomotor instability   CURRENT THERAPY: Faslodex/aromasin/Xgeva  INTERVAL HISTORY: Kaylee Mitchell 59 y.o. female returns for follow-up of her breast cancer, stage IV disease. Last imaging was in January and showed stable bone metastases. She continues on XGEVA, faslodex and aromasin.  The patient has complaints of not feeling well since she began her Metformin medication last Thursday.  She is nauseated, has diarrhea, and has no appetite.  The patient has also recently stepped on a light bulb and had to get glass removed from her R foot by a podiatrist.  She also complains of a severe soreness on R calf that prevented her from walking one morning.  She is currently using a cane for support.  She takes her Norco medication daily and as frequently as necessary.  She admits to allowing her son to take one as well if he needs it.  She was recently given a urine drug screen test from her primary, Dr. Karie Kirks, and states that she does not know why this was given.  She does not do recreational drugs. Of note per report her drug screen from Dr.  Vickey Sages office showed no evidence of hydrocodone.  MEDICAL HISTORY: Past Medical History  Diagnosis Date  . Hypertension   . High cholesterol   . Knee pain, bilateral 2013  . Cancer     rt breast is primary  . Bone metastasis 09/13/2010  . Diabetes mellitus     type II  . Depression   . Depression 12/14/2011  . Invasive ductal carcinoma of breast 09/13/2010    has Invasive ductal carcinoma of breast; Bone metastasis; Medial meniscus, posterior horn derangement; S/P arthroscopy of left knee; Difficulty in walking(719.7); Depression; Diabetes mellitus with chronic kidney disease; CKD (chronic kidney disease); Hyperlipidemia; Essential hypertension, benign; Morbid obesity; Tinea versicolor; Lumbar back pain; and URI, acute on her problem list.      Invasive ductal carcinoma of breast   05/26/2009 Initial Diagnosis Needle core biopsy showed invasive mammary carcinoma with 1 + lymph node   06/03/2009 Surgery Right modified radical mastectomy with 8/8 + lymph nodes by Dr. Ronnell Freshwater   07/22/2009 Cancer Staging Bone scan + for multiple osseous lesions.  No disease elsewhere.   07/24/2009 - 03/23/2010 Chemotherapy Arimidex   03/23/2010 Progression    03/23/2010 - 12/05/2012 Chemotherapy Tamoxifen   11/28/2012 Progression Worsening of lumbar spine osseous disease   12/06/2012 -  Chemotherapy Fulvestrant + Exemestane   01/24/2014 PET scan No evidence of hypermetabolic recurrent or metastatic disease. Widespread osseous metastasis, without significant marrow hypermetabolism.   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  has No Known Allergies.  We administered fulvestrant and denosumab.  SURGICAL HISTORY: Past Surgical History  Procedure Laterality Date  . Knee surgery      right-arthroscopy  . Mastectomy      right side  . Abdominal hysterectomy      SOCIAL HISTORY: Social History   Social History  . Marital  Status: Single    Spouse Name: N/A  . Number of Children: N/A  . Years of Education: 12   Occupational History  . disabled    Social History Main Topics  . Smoking status: Never Smoker   . Smokeless tobacco: Never Used  . Alcohol Use: No  . Drug Use: No  . Sexual Activity: Yes    Birth Control/ Protection: Surgical   Other Topics Concern  . Not on file   Social History Narrative    FAMILY HISTORY: Family History  Problem Relation Age of Onset  . Arthritis    . Cancer    . Diabetes    . Anesthesia problems Neg Hx   . Hypotension Neg Hx   . Malignant hyperthermia Neg Hx   . Pseudochol deficiency Neg Hx   . Diabetes Mother   . Diabetes Father   . Cancer Maternal Aunt     Review of Systems  Constitutional: Negative for weight loss and malaise/fatigue. Negative for fever, chills and diaphoresis.  HENT: Negative for congestion, ear discharge, ear pain, hearing loss, nosebleeds, sore throat and tinnitus.   Eyes: Negative.   Respiratory: Negative.  Negative for stridor.   Cardiovascular: Negative.   Gastrointestinal:nausea, diarrhea attributed to metformin Genitourinary: Negative.   Musculoskeletal:       Chronic bilateral leg pain R calf pain Skin: Negative.   Neurological: Negative for dizziness, tingling, tremors, sensory change, speech change, focal weakness, seizures, headaches, loss of consciousness and weakness.  Endo/Heme/Allergies: Negative.   Psychiatric/Behavioral: Negative.   14 point review of systems was performed and is negative except as detailed under history of present illness and above   PHYSICAL EXAMINATION  ECOG PERFORMANCE STATUS: 1 - Symptomatic but completely ambulatory  Filed Vitals:   08/29/14 0901  BP: 143/88  Pulse: 90  Temp: 98.1 F (36.7 C)  Resp: 20    Physical Exam  Constitutional: She is oriented to person, place, and time and well-developed, well-nourished, and in no distress.  Obese, cane  HENT:  Head: Normocephalic and  atraumatic.  Nose: Nose normal.  Mouth/Throat: Oropharynx is clear and moist. No oropharyngeal exudate.  Eyes: Conjunctivae and EOM are normal. Pupils are equal, round, and reactive to light. Right eye exhibits no discharge. Left eye exhibits no discharge. No scleral icterus.  Neck: Normal range of motion. Neck supple. No tracheal deviation present. No thyromegaly present.  Cardiovascular: Normal rate, regular rhythm and normal heart sounds.  Exam reveals no gallop and no friction rub.   No murmur heard. Pulmonary/Chest: Effort normal and breath sounds normal. She has no wheezes. She has no rales.  Left chest wall with prior mastectomy, incision intact, no nodularity or concerning skin changes  Abdominal: Soft. Bowel sounds are normal. She exhibits no distension and no mass. There is no tenderness. There is no rebound and no guarding.  Musculoskeletal: Normal range of motion. She exhibits no edema.  Lymphadenopathy:    She has no cervical adenopathy.  Neurological: She is alert and oriented to person, place, and time. She has normal reflexes. No cranial nerve deficit. Gait normal. Coordination normal.  Skin: Skin is warm and  dry. No rash noted.  Psychiatric: Mood, memory, affect and judgment normal.  Nursing note and vitals reviewed.   LABORATORY DATA: Results for Cheatum, Telesha L (MRN [id number])   Ref. Range 08/29/2014 08:40  Sodium Latest Ref Range: 135-145 mmol/L 138  Potassium Latest Ref Range: 3.5-5.1 mmol/L 4.4  Chloride Latest Ref Range: 101-111 mmol/L 104  CO2 Latest Ref Range: 22-32 mmol/L 26  BUN Latest Ref Range: 6-20 mg/dL 25 (H)  Creatinine Latest Ref Range: 0.44-1.00 mg/dL 1.34 (H)  Calcium Latest Ref Range: 8.9-10.3 mg/dL 10.0  EGFR (Non-African Amer.) Latest Ref Range: >60 mL/min 42 (L)  EGFR (African American) Latest Ref Range: >60 mL/min 49 (L)  Glucose Latest Ref Range: 65-99 mg/dL 85  Anion gap Latest Ref Range: 5-15  8  Alkaline Phosphatase Latest Ref Range: 38-126  U/L 92  Albumin Latest Ref Range: 3.5-5.0 g/dL 3.7  AST Latest Ref Range: 15-41 U/L 27  ALT Latest Ref Range: 14-54 U/L 24  Total Protein Latest Ref Range: 6.5-8.1 g/dL 7.7  Total Bilirubin Latest Ref Range: 0.3-1.2 mg/dL 0.7  WBC Latest Ref Range: 4.0-10.5 K/uL 8.1  RBC Latest Ref Range: 3.87-5.11 MIL/uL 4.67  Hemoglobin Latest Ref Range: 12.0-15.0 g/dL 13.4  HCT Latest Ref Range: 36.0-46.0 % 41.8  MCV Latest Ref Range: 78.0-100.0 fL 89.5  MCH Latest Ref Range: 26.0-34.0 pg 28.7  MCHC Latest Ref Range: 30.0-36.0 g/dL 32.1  RDW Latest Ref Range: 11.5-15.5 % 13.6  Platelets Latest Ref Range: 150-400 K/uL 178  Neutrophils Latest Ref Range: 43-77 % 54  Lymphocytes Latest Ref Range: 12-46 % 36  Monocytes Relative Latest Ref Range: 3-12 % 6  Eosinophil Latest Ref Range: 0-5 % 3  Basophil Latest Ref Range: 0-1 % 1  NEUT# Latest Ref Range: 1.7-7.7 K/uL 4.4  Lymphocyte # Latest Ref Range: 0.7-4.0 K/uL 2.9  Monocyte # Latest Ref Range: 0.1-1.0 K/uL 0.5  Eosinophils Absolute Latest Ref Range: 0.0-0.7 K/uL 0.2  Basophils Absolute Latest Ref Range: 0.0-0.1 K/uL 0.1    Results for Bonnet, Sonnet L (MRN [id number])   Ref. Range 02/13/2014 08:42 03/12/2014 08:27 04/10/2014 08:36 05/08/2014 08:49 06/05/2014 08:41 07/03/2014 08:25 08/01/2014 08:43 08/29/2014 08:40  Cancer Antigen-Breast 15-3 Latest Ref Range: <32 U/mL        40 (H)  CA 27.29 Latest Ref Range: 0.0-38.6 U/mL 24.0 27.3 31.3 37.1 31.9 51.2 (H) 48.4 (H) 48.7 (H)    ASSESSMENT and THERAPY PLAN:  Stage IV ER+ carcinoma of the breast with bone metastases Chronic Leg pain Low calcium on XGEVA Questionable drug diversion R calf pain  There are multiple issues to address today. First in regard to her right calf pain I have recommended ultrasound. Second in regards to Dr. Knowlton's office calling us and the patient stating she does let her son occasionally take her pain medication I feel we need to do urine drug screen. I discussed this with her  and she is agreeable.  It is time for repeat imaging and we will arrange for a PET/CT. In addition she is due for a left screening mammogram and we have ordered this today.  She will return in one month for review of the above studies. She will be kept apprised of the results of her right lower extremity ultrasound when available. In addition depending upon her urine drug screen results we may have to refer to pain clinic or enter a formal pain management agreement.  She will currently continue with Faslodex and Xgeva. We will make additional recommendations regarding ongoing   therapy and pending the results of her PET/CT  Pain medication was refilled. Orders Placed This Encounter  Procedures  . NM PET Image Restag (PS) Skull Base To Thigh    Standing Status: Future     Number of Occurrences: 1     Standing Expiration Date: 08/29/2015    Order Specific Question:  Reason for Exam (SYMPTOM  OR DIAGNOSIS REQUIRED)    Answer:  stage IV breast cancer, restaging    Order Specific Question:  Is the patient pregnant?    Answer:  No    Order Specific Question:  Preferred imaging location?    Answer:  [facility]    Standing Status: Future     Number of Occurrences:      Standing Expiration Date: 08/29/2015    Order Specific Question:  Reason for Exam (SYMPTOM  OR DIAGNOSIS REQUIRED)    Answer:  screening    Order Specific Question:  Is the patient pregnant?    Answer:  No    Order Specific Question:  Preferred imaging location?    Answer:  [facility]  . US Venous Img Lower Unilateral Right    [facility]/ESIGNED     MEDICARE      PT TO RETURN TO CLINIC     OK PER [place]    [facility] EMAILED AND ADD ON SHEET FAXED    Standing Status: Future     Number of Occurrences: 1     Standing Expiration Date: 08/29/2015    Order Specific Question:  Reason for Exam (SYMPTOM  OR DIAGNOSIS REQUIRED)    Answer:  RLE pain, stage IV breast cancer    Order Specific Question:   Preferred imaging location?    Answer:  [facility]  . CBC with Differential    Standing Status: Standing     Number of Occurrences: 24     Standing Expiration Date: 08/28/2016  . Comprehensive metabolic panel    Standing Status: Standing     Number of Occurrences: 24     Standing Expiration Date: 08/28/2016  . Cancer antigen 15-3    Standing Status: Standing     Number of Occurrences: 24     Standing Expiration Date: 08/28/2016  . Cancer antigen 27.29    Standing Status: Standing     Number of Occurrences: 24     Standing Expiration Date: 08/28/2016  . Comprehensive metabolic panel    Standing Status: Future     Number of Occurrences:      Standing Expiration Date: 08/29/2015    Order Specific Question:  Has the patient fasted?    Answer:  No  . [id number] 11+Oxyco+Alc+Crt-Bund  . Opiates Confirmation, Urine  . Opiates Confirmation, Urine    Standing Status: Future     Number of Occurrences: 1     Standing Expiration Date: 08/29/2015  . Miscellaneous test    Opiate confirmation urine    Standing Status: Future     Number of Occurrences: 1     Standing Expiration Date: 08/29/2015  . Opiate, quantitative, urine  . Opiates Confirmation, Urine  . Tramadol Gc/Ms, Urine  . SCHEDULING COMMUNICATION    Schedule 15 minute injection appointment  . EKG 12-Lead    Order Specific Question:  Where should this test be performed    Answer:  [facility]    All questions were answered. The patient knows to call the clinic with any problems, questions or concerns. We can  certainly see the patient much sooner if necessary.    This document serves as a record of services personally performed by Shannon Penland, MD. It was created on her behalf by Darielle Pickett, a trained medical scribe. The creation of this record is based on the scribe's personal observations and the provider's statements to them. This document has been checked and approved by the attending provider.  I have reviewed the above  documentation for accuracy and completeness, and I agree with the above.  This note was electronically signed.  Shannon K. Penland, MD    

## 2014-08-29 NOTE — Progress Notes (Signed)
Labs performed today.   

## 2014-08-30 LAB — CANCER ANTIGEN 27.29: CA 27.29: 48.7 U/mL — AB (ref 0.0–38.6)

## 2014-09-08 LAB — OPIATES CONFIRMATION, URINE
Codeine: NEGATIVE
Hydrocodone Confirm: 700 ng/mL
Hydrocodone: POSITIVE — AB
Hydromorphone: NEGATIVE
Morphine: NEGATIVE
Opiates: POSITIVE ng/mL — AB

## 2014-09-08 LAB — DRUG SCREEN 764883 11+OXYCO+ALC+CRT-BUND
Amphetamines, Urine: NEGATIVE ng/mL
BARBITURATE: NEGATIVE ng/mL
BENZODIAZ UR QL: NEGATIVE ng/mL
CANNABINOID QUANT UR: NEGATIVE ng/mL
COCAINE (METABOLITE): NEGATIVE ng/mL
Creatinine: 191 mg/dL (ref 20.0–300.0)
ETHANOL: NEGATIVE %
METHADONE SCREEN, URINE: NEGATIVE ng/mL
Meperidine: NEGATIVE ng/mL
Oxycodone/Oxymorphone, Urine: NEGATIVE ng/mL
Phencyclidine: NEGATIVE ng/mL
Propoxyphene, Urine: NEGATIVE ng/mL
pH, Urine: 5.3 (ref 4.5–8.9)

## 2014-09-08 LAB — TRAMADOL GC/MS, URINE
Tramadol gc/ms Conf: 5400 ng/mL
Tramadol: POSITIVE — AB

## 2014-09-10 LAB — OPIATE, QUANTITATIVE, URINE
Codeine: NEGATIVE
HYDROCODONE: 677 ng/mL
Hydrocodone: POSITIVE — AB
Hydromorphone: NEGATIVE
MORPHINE: NEGATIVE
OXYCODONE+OXYMORPHONE UR QL SCN: NEGATIVE
Opiates: POSITIVE — AB

## 2014-09-16 DIAGNOSIS — E1142 Type 2 diabetes mellitus with diabetic polyneuropathy: Secondary | ICD-10-CM | POA: Diagnosis not present

## 2014-09-16 DIAGNOSIS — B351 Tinea unguium: Secondary | ICD-10-CM | POA: Diagnosis not present

## 2014-09-16 DIAGNOSIS — L84 Corns and callosities: Secondary | ICD-10-CM | POA: Diagnosis not present

## 2014-09-18 ENCOUNTER — Encounter (HOSPITAL_COMMUNITY)
Admission: RE | Admit: 2014-09-18 | Discharge: 2014-09-18 | Disposition: A | Discharge status: Home / Self Care | Payer: Medicare Other | Source: Ambulatory Visit | Attending: Hematology & Oncology | Admitting: Hematology & Oncology

## 2014-09-18 DIAGNOSIS — C50919 Malignant neoplasm of unspecified site of unspecified female breast: Secondary | ICD-10-CM | POA: Insufficient documentation

## 2014-09-18 DIAGNOSIS — C7951 Secondary malignant neoplasm of bone: Secondary | ICD-10-CM | POA: Diagnosis not present

## 2014-09-18 DIAGNOSIS — Z1239 Encounter for other screening for malignant neoplasm of breast: Secondary | ICD-10-CM | POA: Diagnosis not present

## 2014-09-18 DIAGNOSIS — M79604 Pain in right leg: Secondary | ICD-10-CM | POA: Diagnosis not present

## 2014-09-18 DIAGNOSIS — C787 Secondary malignant neoplasm of liver and intrahepatic bile duct: Secondary | ICD-10-CM | POA: Diagnosis not present

## 2014-09-18 LAB — GLUCOSE, CAPILLARY: GLUCOSE-CAPILLARY: 122 mg/dL — AB (ref 65–99)

## 2014-09-18 MED ORDER — FLUDEOXYGLUCOSE F - 18 (FDG) INJECTION
14.5400 | Freq: Once | INTRAVENOUS | Status: DC | PRN
  Administered 2014-09-18: 14.54 via INTRAVENOUS
  Filled 2014-09-18: qty 14.54

## 2014-09-24 ENCOUNTER — Other Ambulatory Visit (HOSPITAL_COMMUNITY): Payer: Medicare Other

## 2014-09-25 ENCOUNTER — Encounter (HOSPITAL_BASED_OUTPATIENT_CLINIC_OR_DEPARTMENT_OTHER): Payer: Medicare Other | Admitting: Oncology

## 2014-09-25 ENCOUNTER — Encounter (HOSPITAL_BASED_OUTPATIENT_CLINIC_OR_DEPARTMENT_OTHER): Payer: Medicare Other

## 2014-09-25 ENCOUNTER — Encounter (HOSPITAL_COMMUNITY): Payer: Medicare Other | Attending: Oncology

## 2014-09-25 ENCOUNTER — Ambulatory Visit (HOSPITAL_COMMUNITY): Payer: Medicare Other | Admitting: Hematology & Oncology

## 2014-09-25 ENCOUNTER — Encounter (HOSPITAL_COMMUNITY): Payer: Self-pay | Admitting: Oncology

## 2014-09-25 VITALS — BP 159/93 | HR 94 | Temp 98.7°F | Resp 18 | Wt 298.2 lb

## 2014-09-25 DIAGNOSIS — Z5111 Encounter for antineoplastic chemotherapy: Secondary | ICD-10-CM | POA: Diagnosis not present

## 2014-09-25 DIAGNOSIS — C50919 Malignant neoplasm of unspecified site of unspecified female breast: Secondary | ICD-10-CM | POA: Insufficient documentation

## 2014-09-25 DIAGNOSIS — C7951 Secondary malignant neoplasm of bone: Secondary | ICD-10-CM | POA: Diagnosis not present

## 2014-09-25 DIAGNOSIS — C50211 Malignant neoplasm of upper-inner quadrant of right female breast: Secondary | ICD-10-CM

## 2014-09-25 DIAGNOSIS — Z1239 Encounter for other screening for malignant neoplasm of breast: Secondary | ICD-10-CM

## 2014-09-25 DIAGNOSIS — M79604 Pain in right leg: Secondary | ICD-10-CM

## 2014-09-25 LAB — COMPREHENSIVE METABOLIC PANEL
ALT: 36 U/L (ref 14–54)
AST: 38 U/L (ref 15–41)
Albumin: 3.5 g/dL (ref 3.5–5.0)
Alkaline Phosphatase: 99 U/L (ref 38–126)
Anion gap: 8 (ref 5–15)
BUN: 27 mg/dL — AB (ref 6–20)
CHLORIDE: 106 mmol/L (ref 101–111)
CO2: 26 mmol/L (ref 22–32)
Calcium: 9.3 mg/dL (ref 8.9–10.3)
Creatinine, Ser: 1.23 mg/dL — ABNORMAL HIGH (ref 0.44–1.00)
GFR, EST AFRICAN AMERICAN: 54 mL/min — AB (ref >60)
GFR, EST NON AFRICAN AMERICAN: 47 mL/min — AB (ref >60)
Glucose, Bld: 100 mg/dL — ABNORMAL HIGH (ref 65–99)
Potassium: 4.1 mmol/L (ref 3.5–5.1)
SODIUM: 140 mmol/L (ref 135–145)
Total Bilirubin: 0.6 mg/dL (ref 0.3–1.2)
Total Protein: 7.3 g/dL (ref 6.5–8.1)

## 2014-09-25 LAB — CBC WITH DIFFERENTIAL/PLATELET
BASOS ABS: 0 10*3/uL (ref 0.0–0.1)
Basophils Relative: 1 % (ref 0–1)
EOS ABS: 0.2 10*3/uL (ref 0.0–0.7)
EOS PCT: 3 % (ref 0–5)
HCT: 39.6 % (ref 36.0–46.0)
Hemoglobin: 12.6 g/dL (ref 12.0–15.0)
LYMPHS PCT: 37 % (ref 12–46)
Lymphs Abs: 2.4 10*3/uL (ref 0.7–4.0)
MCH: 28.4 pg (ref 26.0–34.0)
MCHC: 31.8 g/dL (ref 30.0–36.0)
MCV: 89.2 fL (ref 78.0–100.0)
MONO ABS: 0.4 10*3/uL (ref 0.1–1.0)
Monocytes Relative: 6 % (ref 3–12)
Neutro Abs: 3.4 10*3/uL (ref 1.7–7.7)
Neutrophils Relative %: 53 % (ref 43–77)
PLATELETS: 175 10*3/uL (ref 150–400)
RBC: 4.44 MIL/uL (ref 3.87–5.11)
RDW: 14 % (ref 11.5–15.5)
WBC: 6.4 10*3/uL (ref 4.0–10.5)

## 2014-09-25 LAB — CANCER ANTIGEN 15-3: CANCER ANTIGEN-BREAST 15-3: 42 U/mL — AB (ref <32)

## 2014-09-25 MED ORDER — DENOSUMAB 120 MG/1.7ML ~~LOC~~ SOLN
120.0000 mg | Freq: Once | SUBCUTANEOUS | Status: AC
  Administered 2014-09-25: 120 mg via SUBCUTANEOUS
  Filled 2014-09-25: qty 1.7

## 2014-09-25 MED ORDER — OXYCODONE HCL 10 MG PO TABS: ORAL_TABLET | ORAL | Status: DC

## 2014-09-25 MED ORDER — FULVESTRANT 250 MG/5ML IM SOLN
500.0000 mg | INTRAMUSCULAR | Status: DC
  Administered 2014-09-25: 500 mg via INTRAMUSCULAR
  Filled 2014-09-25: qty 10

## 2014-09-25 NOTE — Progress Notes (Signed)
Labs drawn
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
## 2014-09-25 NOTE — Assessment & Plan Note (Addendum)
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  On Faslodex and Aromasin with control of disease.  Recent PET scan demonstrates stable disease (01/24/2014) with improvement of bone metastases healing.    Oncology history updated.  I personally reviewed and went over radiographic studies with the patient.  The results are noted within this dictation.  PET imaging demonstrates progression of disease with a new hepatic lesion.  Continue Xgeva every 4 weeks, Faslodex every 4 weeks, and Aromasin daily for now.  We will get her set-up for a biopsy of this new liver lesion to prove that it is metastatic breast cancer and ascertain markers  To help guide future treatment/recommendations.  Message sent to IR to consider biopsy.  Dr. Laurence Ferrari has responded promptly and agrees that biopsy is appropriate from an IR standpoint.  Pain contract signed.  In [facility].  Copy will be sent to primary care provider.  Oxycodone refilled today.  Patient knows she is not to take any other pain medications, unless prescribed by [place].  I reviewed the pain contract in detail.  She understands the agreement.  Return in 2 weeks for follow-up.

## 2014-09-25 NOTE — Progress Notes (Signed)
..  Kaylee Mitchell presents today for injection per the provider's orders.  faslodex administration without incident; see MAR for injection details.  Patient tolerated procedure well and without incident.  No questions or complaints noted at this time.  [name] presents today for injection per the provider's orders.  xgeva administration without incident; see MAR for injection details.  Patient tolerated procedure well and without incident.  No questions or complaints noted at this time.

## 2014-09-25 NOTE — Patient Instructions (Addendum)
[place] at [facility] Discharge Instructions  RECOMMENDATIONS MADE BY THE CONSULTANT AND ANY TEST RESULTS WILL BE SENT TO YOUR REFERRING PHYSICIAN.  Exam done and seen by Gershon Mussel our PA   Faslodex and xgeva today  Pain med contract signed    Return in 2weeks   Thank you for choosing [place] at [facility] to provide your oncology and hematology care.  To afford each patient quality time with our provider, please arrive at least 15 minutes before your scheduled appointment time.    You need to re-schedule your appointment should you arrive 10 or more minutes late.  We strive to give you quality time with our providers, and arriving late affects you and other patients whose appointments are after yours.  Also, if you no show three or more times for appointments you may be dismissed from the clinic at the providers discretion.     Again, thank you for choosing [facility].  Our hope is that these requests will decrease the amount of time that you wait before being seen by our physicians.       _____________________________________________________________  Should you have questions after your visit to [facility], please contact our office at [phone] between the hours of 8:30 a.m. and 4:30 p.m.  Voicemails left after 4:30 p.m. will not be returned until the following business day.  For prescription refill requests, have your pharmacy contact our office.

## 2014-09-25 NOTE — Progress Notes (Signed)
See office visit encounter. 

## 2014-09-26 ENCOUNTER — Ambulatory Visit (HOSPITAL_COMMUNITY): Payer: Medicare Other | Admitting: Hematology & Oncology

## 2014-09-26 ENCOUNTER — Encounter (HOSPITAL_COMMUNITY): Payer: Medicare Other

## 2014-09-26 LAB — CANCER ANTIGEN 27.29: CA 27.29: 65.3 U/mL — ABNORMAL HIGH (ref 0.0–38.6)

## 2014-09-28 ENCOUNTER — Encounter (HOSPITAL_COMMUNITY): Payer: Self-pay | Admitting: Hematology & Oncology

## 2014-09-29 ENCOUNTER — Other Ambulatory Visit: Payer: Self-pay | Admitting: Radiology

## 2014-09-30 ENCOUNTER — Ambulatory Visit (HOSPITAL_COMMUNITY)
Admission: RE | Admit: 2014-09-30 | Discharge: 2014-09-30 | Disposition: A | Discharge status: Home / Self Care | Payer: Medicare Other | Source: Ambulatory Visit | Attending: Oncology | Admitting: Oncology

## 2014-09-30 ENCOUNTER — Encounter (HOSPITAL_COMMUNITY): Payer: Self-pay

## 2014-09-30 DIAGNOSIS — Z794 Long term (current) use of insulin: Secondary | ICD-10-CM | POA: Insufficient documentation

## 2014-09-30 DIAGNOSIS — Z7982 Long term (current) use of aspirin: Secondary | ICD-10-CM | POA: Diagnosis not present

## 2014-09-30 DIAGNOSIS — Z79811 Long term (current) use of aromatase inhibitors: Secondary | ICD-10-CM | POA: Insufficient documentation

## 2014-09-30 DIAGNOSIS — C787 Secondary malignant neoplasm of liver and intrahepatic bile duct: Secondary | ICD-10-CM | POA: Insufficient documentation

## 2014-09-30 DIAGNOSIS — Z79899 Other long term (current) drug therapy: Secondary | ICD-10-CM | POA: Insufficient documentation

## 2014-09-30 DIAGNOSIS — R160 Hepatomegaly, not elsewhere classified: Secondary | ICD-10-CM | POA: Diagnosis not present

## 2014-09-30 DIAGNOSIS — E780 Pure hypercholesterolemia: Secondary | ICD-10-CM | POA: Diagnosis not present

## 2014-09-30 DIAGNOSIS — E119 Type 2 diabetes mellitus without complications: Secondary | ICD-10-CM | POA: Insufficient documentation

## 2014-09-30 DIAGNOSIS — C7951 Secondary malignant neoplasm of bone: Secondary | ICD-10-CM

## 2014-09-30 DIAGNOSIS — F329 Major depressive disorder, single episode, unspecified: Secondary | ICD-10-CM | POA: Diagnosis not present

## 2014-09-30 DIAGNOSIS — C50919 Malignant neoplasm of unspecified site of unspecified female breast: Secondary | ICD-10-CM

## 2014-09-30 DIAGNOSIS — K769 Liver disease, unspecified: Secondary | ICD-10-CM | POA: Insufficient documentation

## 2014-09-30 DIAGNOSIS — K7689 Other specified diseases of liver: Secondary | ICD-10-CM | POA: Diagnosis not present

## 2014-09-30 DIAGNOSIS — Z853 Personal history of malignant neoplasm of breast: Secondary | ICD-10-CM | POA: Insufficient documentation

## 2014-09-30 DIAGNOSIS — I10 Essential (primary) hypertension: Secondary | ICD-10-CM | POA: Insufficient documentation

## 2014-09-30 LAB — PROTIME-INR
INR: 0.98 (ref 0.00–1.49)
Prothrombin Time: 13.2 seconds (ref 11.6–15.2)

## 2014-09-30 LAB — CBC
HEMATOCRIT: 40.8 % (ref 36.0–46.0)
Hemoglobin: 12.9 g/dL (ref 12.0–15.0)
MCH: 27.9 pg (ref 26.0–34.0)
MCHC: 31.6 g/dL (ref 30.0–36.0)
MCV: 88.3 fL (ref 78.0–100.0)
Platelets: 210 10*3/uL (ref 150–400)
RBC: 4.62 MIL/uL (ref 3.87–5.11)
RDW: 14 % (ref 11.5–15.5)
WBC: 8.1 10*3/uL (ref 4.0–10.5)

## 2014-09-30 LAB — GLUCOSE, CAPILLARY
Glucose-Capillary: 180 mg/dL — ABNORMAL HIGH (ref 65–99)
Glucose-Capillary: 160 mg/dL — ABNORMAL HIGH (ref 65–99)

## 2014-09-30 LAB — APTT: aPTT: 31 seconds (ref 24–37)

## 2014-09-30 MED ORDER — FENTANYL CITRATE (PF) 100 MCG/2ML IJ SOLN
INTRAMUSCULAR | Status: AC
  Filled 2014-09-30: qty 4

## 2014-09-30 MED ORDER — GELATIN ABSORBABLE 12-7 MM EX MISC
CUTANEOUS | Status: AC
  Filled 2014-09-30: qty 1

## 2014-09-30 MED ORDER — MIDAZOLAM HCL 2 MG/2ML IJ SOLN
INTRAMUSCULAR | Status: AC | PRN
  Administered 2014-09-30 (×2): 1 mg via INTRAVENOUS

## 2014-09-30 MED ORDER — SODIUM CHLORIDE 0.9 % IV SOLN: Freq: Once | INTRAVENOUS | Status: DC

## 2014-09-30 MED ORDER — FENTANYL CITRATE (PF) 100 MCG/2ML IJ SOLN
INTRAMUSCULAR | Status: AC | PRN
  Administered 2014-09-30 (×2): 50 ug via INTRAVENOUS

## 2014-09-30 MED ORDER — LIDOCAINE-EPINEPHRINE 1 %-1:100000 IJ SOLN
INTRAMUSCULAR | Status: AC
  Filled 2014-09-30: qty 1

## 2014-09-30 MED ORDER — MIDAZOLAM HCL 2 MG/2ML IJ SOLN
INTRAMUSCULAR | Status: AC
  Filled 2014-09-30: qty 4

## 2014-09-30 NOTE — Procedures (Signed)
Technically successful CT guided biopsy of indeterminate lesion in the right lobe of the liver.  No immediate post procedural complications.   Ronny Bacon, MD Pager #: [phone]

## 2014-09-30 NOTE — H&P (Signed)
Chief Complaint: Patient was seen in consultation today for liver lesion biopsy at the request of Kefalas,Thomas PA-C  Referring Physician(s): Kefalas,Thomas S  History of Present Illness: Kaylee Mitchell is a 59 y.o. female with breast cancer. She had a recent PET scan which found a suspicious liver lesion and she is now referred for image guided biopsy. PMHx, meds, imaging reviewed. Has been NPO this am.  Past Medical History  Diagnosis Date  . Hypertension   . High cholesterol   . Knee pain, bilateral 2013  . Cancer     rt breast is primary  . Bone metastasis 09/13/2010  . Diabetes mellitus     type II  . Depression   . Depression 12/14/2011  . Invasive ductal carcinoma of breast 09/13/2010    Past Surgical History  Procedure Laterality Date  . Knee surgery      right-arthroscopy  . Mastectomy      right side  . Abdominal hysterectomy      Allergies: Review of patient's allergies indicates no known allergies.  Medications: Prior to Admission medications   Medication Sig Start Date End Date Taking? Authorizing Provider  aspirin EC 81 MG tablet Take 81 mg by mouth daily.     Yes Historical Provider, MD  atorvastatin (LIPITOR) 20 MG tablet TAKE ONE TABLET BY MOUTH ONCE DAILY 03/27/14  Yes Alycia Rossetti, MD  calcium-vitamin D (OSCAL WITH D) 500-200 MG-UNIT per tablet Take 1 tablet by mouth 2 (two) times daily.    Yes Historical Provider, MD  carvedilol (COREG) 12.5 MG tablet TAKE ONE TABLET BY MOUTH TWICE DAILY WITH MEALS 08/19/13  Yes Alycia Rossetti, MD  exemestane (AROMASIN) 25 MG tablet Take 1 tablet (25 mg total) by mouth daily after breakfast. 11/19/13  Yes Farrel Gobble, MD  gabapentin (NEURONTIN) 300 MG capsule Take 300 mg by mouth 3 (three) times daily. 09/16/14  Yes Historical Provider, MD  Insulin Glargine (LANTUS) 100 UNIT/ML Solostar Pen Inject 60 Units into the skin at bedtime. 11/06/12  Yes Alycia Rossetti, MD  LORazepam (ATIVAN PO) Take 1 tablet by  mouth as needed.   Yes Historical Provider, MD  losartan-hydrochlorothiazide (HYZAAR) 50-12.5 MG per tablet Take 1 tablet by mouth daily. 01/14/14  Yes Historical Provider, MD  metFORMIN (GLUCOPHAGE) 500 MG tablet Take 500 mg by mouth 2 (two) times daily with a meal.   Yes Historical Provider, MD  omeprazole (PRILOSEC) 40 MG capsule TAKE 1 CAPSULE (40 MG TOTAL) BY MOUTH DAILY. 07/28/14  Yes Patrici Ranks, MD  Oxycodone HCl 10 MG TABS Take 1 tablet every 4 hours as needed for pain. 09/25/14  Yes Manon Hilding Kefalas, PA-C  potassium chloride SA (K-DUR,KLOR-CON) 20 MEQ tablet Take 1 tablet (20 mEq total) by mouth daily. 12/17/13  Yes Manon Hilding Kefalas, PA-C  sertraline (ZOLOFT) 100 MG tablet Take 100 mg by mouth daily. 01/14/14  Yes Historical Provider, MD  venlafaxine XR (EFFEXOR XR) 150 MG 24 hr capsule Take take one capsule at bedtime nightly to control hot flashes. Patient taking differently: Take 150 mg by mouth daily as needed. Take take one capsule at bedtime nightly to control hot flashes. 07/02/13  Yes Farrel Gobble, MD  VENTOLIN HFA 108 (90 BASE) MCG/ACT inhaler Inhale 2 puffs into the lungs 4 (four) times daily as needed. 11/14/13  Yes Historical Provider, MD  docusate sodium (STOOL SOFTENER) 100 MG capsule Take 100 mg by mouth daily as needed for mild constipation.  Historical Provider, MD  insulin lispro (HUMALOG) 100 UNIT/ML injection Inject 10-15 Units into the skin 3 (three) times daily before meals. Inject 10 to 15 units before each meal 04/16/12   Alycia Rossetti, MD  ondansetron (ZOFRAN) 8 MG tablet Take 1 tablet (8 mg total) by mouth every 8 (eight) hours as needed for nausea or vomiting. 03/07/14   Patrici Ranks, MD  Polyethylene Glycol 3350 (MIRALAX PO) Take 17 g by mouth daily as needed.     Historical Provider, MD     Family History  Problem Relation Age of Onset  . Arthritis    . Cancer    . Diabetes    . Anesthesia problems Neg Hx   . Hypotension Neg Hx   . Malignant  hyperthermia Neg Hx   . Pseudochol deficiency Neg Hx   . Diabetes Mother   . Diabetes Father   . Cancer Maternal Aunt     Social History   Social History  . Marital Status: Single    Spouse Name: N/A  . Number of Children: N/A  . Years of Education: 12   Occupational History  . disabled    Social History Main Topics  . Smoking status: Never Smoker   . Smokeless tobacco: Never Used  . Alcohol Use: No  . Drug Use: No  . Sexual Activity: Yes    Birth Control/ Protection: Surgical   Other Topics Concern  . None   Social History Narrative     Review of Systems: A 12 point ROS discussed and pertinent positives are indicated in the HPI above.  All other systems are negative.  Review of Systems  Vital Signs: BP 161/93 mmHg  Pulse 102  Temp(Src) 98.4 F (36.9 C) (Oral)  Resp 18  Ht 5\' 5"  (1.651 m)  Wt 297 lb (134.718 kg)  BMI 49.42 kg/m2  SpO2 98%  Physical Exam  Constitutional: She is oriented to person, place, and time. She appears well-developed and well-nourished. No distress.  HENT:  Head: Normocephalic.  Mouth/Throat: Oropharynx is clear and moist.  Neck: Normal range of motion. No JVD present. No tracheal deviation present.  Cardiovascular: Normal rate, regular rhythm and normal heart sounds.   No murmur heard. Pulmonary/Chest: Effort normal and breath sounds normal. No respiratory distress.  Abdominal: Soft. Bowel sounds are normal. She exhibits no mass. There is no tenderness.  Neurological: She is alert and oriented to person, place, and time.  Psychiatric: She has a normal mood and affect. Judgment normal.    Mallampati Score:  MD Evaluation Airway: WNL Heart: WNL Abdomen: WNL Chest/ Lungs: WNL ASA  Classification: 2 Mallampati/Airway Score: Two  Imaging: Nm Pet Image Restag (ps) Skull Base To Thigh  09/18/2014   CLINICAL DATA:  Subsequent treatment strategy for breast carcinoma.  EXAM: NUCLEAR MEDICINE PET SKULL BASE TO THIGH  TECHNIQUE:  14.5 mCi F-18 FDG was injected intravenously. Full-ring PET imaging was performed from the skull base to thigh after the radiotracer. CT data was obtained and used for attenuation correction and anatomic localization.  FASTING BLOOD GLUCOSE:  Value: 122 mg/dl  COMPARISON:  PET-CT scan 01/24/2014  FINDINGS: NECK  No hypermetabolic lymph nodes in the neck.  CHEST  No hypermetabolic mediastinal or hilar nodes. No suspicious pulmonary nodules on the CT scan. Post RIGHT mastectomy anatomy. No hypermetabolic RIGHT axillary lymph nodes.  ABDOMEN/PELVIS  There is new hypermetabolic activity lesions within LEFT hepatic lobe and inferiorly in the LEFT lateral hepatic lobe. This activity is  intense with SUV max equal 12.4. Regional involvement in segment IVA and IVB measures approximately 6.3 by 5.8 cm. Lesion in segment III measuring approximately 2.6 cm.  There are no hypermetabolic abdominal or pelvic lymph nodes.  SKELETON  Multiple sclerotic skeletal metastasis without metabolic activity.  IMPRESSION: 1. New hepatic metastasis within the LEFT hepatic lobe involving segments IVA, IVB, and III. 2. Post RIGHT mastectomy without evidence local recurrence or thoracic adenopathy. 3. Stable sclerotic skeletal metastasis without metabolic activity consists with treated disease.   Electronically Signed   By: Suzy Bouchard M.D.   On: 09/18/2014 09:35    Labs:  CBC:  Recent Labs  08/01/14 0843 08/29/14 0840 09/25/14 0840 09/30/14 0730  WBC 6.8 8.1 6.4 8.1  HGB 13.2 13.4 12.6 12.9  HCT 41.0 41.8 39.6 40.8  PLT 170 178 175 210    COAGS: No results for input(s): INR, APTT in the last 8760 hours.  BMP:  Recent Labs  07/03/14 0825 08/01/14 0843 08/29/14 0840 09/25/14 0840  NA 139 138 138 140  K 4.3 4.2 4.4 4.1  CL 102 104 104 106  CO2 29 25 26 26   GLUCOSE 253* 183* 85 100*  BUN 25* 25* 25* 27*  CALCIUM 9.6 8.7* 10.0 9.3  CREATININE 1.21* 1.16* 1.34* 1.23*  GFRNONAA 48* 50* 42* 47*  GFRAA 55* 58*  49* 54*    LIVER FUNCTION TESTS:  Recent Labs  07/03/14 0825 08/01/14 0843 08/29/14 0840 09/25/14 0840  BILITOT 0.8 0.7 0.7 0.6  AST 21 29 27  38  ALT 21 24 24  36  ALKPHOS 78 89 92 99  PROT 7.9 7.2 7.7 7.3  ALBUMIN 3.7 3.7 3.7 3.5    TUMOR MARKERS:  Recent Labs  01/16/14 0850  CEA 3.2    Assessment and Plan: Breast cancer Hypermetabolic liver lesion For CT guided biopsy today Labs reviewed. Risks and Benefits discussed with the patient including, but not limited to bleeding, infection, damage to adjacent structures or low yield requiring additional tests. All of the patient's questions were answered, patient is agreeable to proceed. Consent signed and in chart.    Thank you for this interesting consult.  I greatly enjoyed meeting LUCILIA YANNI and look forward to participating in their care.  A copy of this report was sent to the requesting provider on this date.  SignedAscencion Dike 09/30/2014, 8:21 AM   I spent a total of 15 Minutes   in face to face in clinical consultation, greater than 50% of which was counseling/coordinating care for liver biopsy

## 2014-09-30 NOTE — Discharge Instructions (Signed)
Liver Biopsy, Care After °These instructions give you information on caring for yourself after your procedure. Your doctor may also give you more specific instructions. Call your doctor if you have any problems or questions after your procedure. °HOME CARE °· Rest at home for 1-2 days or as told by your doctor. °· Have someone stay with you for at least 24 hours. °· Do not do these things in the first 24 hours: °¨ Drive. °¨ Use machinery. °¨ Take care of other people. °¨ Sign legal documents. °¨ Take a bath or shower. °· There are many different ways to close and cover a cut (incision). For example, a cut can be closed with stitches, skin glue, or adhesive strips. Follow your doctor's instructions on: °¨ Taking care of your cut. °¨ Changing and removing your bandage (dressing). °¨ Removing whatever was used to close your cut. °· Do not drink alcohol in the first week. °· Do not lift more than 5 pounds or play contact sports for the first 2 weeks. °· Take medicines only as told by your doctor. For 1 week, do not take medicine that has aspirin in it or medicines like ibuprofen. °· Get your test results. °GET HELP IF: °· A cut bleeds and leaves more than just a small spot of blood. °· A cut is red, puffs up (swells), or hurts more than before. °· Fluid or something else comes from a cut. °· A cut smells bad. °· You have a fever or chills. °GET HELP RIGHT AWAY IF: °· You have swelling, bloating, or pain in your belly (abdomen). °· You get dizzy or faint. °· You have a rash. °· You feel sick to your stomach (nauseous) or throw up (vomit). °· You have trouble breathing, feel short of breath, or feel faint. °· Your chest hurts. °· You have problems talking or seeing. °· You have trouble balancing or moving your arms or legs. °Document Released: 10/13/2007 Document Revised: 05/20/2013 Document Reviewed: 03/01/2013 °[employer]® Patient Information ©2015 [employer]. This information is not intended to replace advice given to  you by your health care provider. Make sure you discuss any questions you have with your health care provider. ° °

## 2014-09-30 NOTE — Sedation Documentation (Signed)
Bandaid R flank area intact 

## 2014-10-01 ENCOUNTER — Other Ambulatory Visit (HOSPITAL_COMMUNITY): Payer: Self-pay | Admitting: Oncology

## 2014-10-01 DIAGNOSIS — M62838 Other muscle spasm: Secondary | ICD-10-CM

## 2014-10-01 MED ORDER — CYCLOBENZAPRINE HCL 10 MG PO TABS: 10.0000 mg | ORAL_TABLET | Freq: Three times a day (TID) | ORAL | Status: DC | PRN

## 2014-10-05 ENCOUNTER — Emergency Department (HOSPITAL_COMMUNITY)
Admission: EM | Admit: 2014-10-05 | Discharge: 2014-10-05 | Disposition: A | Discharge status: Home / Self Care | Payer: Medicare Other | Attending: Emergency Medicine | Admitting: Emergency Medicine

## 2014-10-05 ENCOUNTER — Encounter (HOSPITAL_COMMUNITY): Payer: Self-pay | Admitting: Emergency Medicine

## 2014-10-05 DIAGNOSIS — E119 Type 2 diabetes mellitus without complications: Secondary | ICD-10-CM | POA: Diagnosis not present

## 2014-10-05 DIAGNOSIS — Z7982 Long term (current) use of aspirin: Secondary | ICD-10-CM | POA: Diagnosis not present

## 2014-10-05 DIAGNOSIS — M25512 Pain in left shoulder: Secondary | ICD-10-CM | POA: Insufficient documentation

## 2014-10-05 DIAGNOSIS — R1084 Generalized abdominal pain: Secondary | ICD-10-CM | POA: Diagnosis not present

## 2014-10-05 DIAGNOSIS — R51 Headache: Secondary | ICD-10-CM | POA: Diagnosis not present

## 2014-10-05 DIAGNOSIS — Z85830 Personal history of malignant neoplasm of bone: Secondary | ICD-10-CM | POA: Diagnosis not present

## 2014-10-05 DIAGNOSIS — I10 Essential (primary) hypertension: Secondary | ICD-10-CM | POA: Insufficient documentation

## 2014-10-05 DIAGNOSIS — F329 Major depressive disorder, single episode, unspecified: Secondary | ICD-10-CM | POA: Insufficient documentation

## 2014-10-05 DIAGNOSIS — Z794 Long term (current) use of insulin: Secondary | ICD-10-CM | POA: Diagnosis not present

## 2014-10-05 DIAGNOSIS — Z79899 Other long term (current) drug therapy: Secondary | ICD-10-CM | POA: Insufficient documentation

## 2014-10-05 DIAGNOSIS — R252 Cramp and spasm: Secondary | ICD-10-CM | POA: Insufficient documentation

## 2014-10-05 DIAGNOSIS — Z853 Personal history of malignant neoplasm of breast: Secondary | ICD-10-CM | POA: Diagnosis not present

## 2014-10-05 DIAGNOSIS — M542 Cervicalgia: Secondary | ICD-10-CM | POA: Diagnosis present

## 2014-10-05 DIAGNOSIS — E780 Pure hypercholesterolemia: Secondary | ICD-10-CM | POA: Insufficient documentation

## 2014-10-05 LAB — COMPREHENSIVE METABOLIC PANEL
ALBUMIN: 3.3 g/dL — AB (ref 3.5–5.0)
ALK PHOS: 123 U/L (ref 38–126)
ALT: 38 U/L (ref 14–54)
ANION GAP: 5 (ref 5–15)
AST: 29 U/L (ref 15–41)
BILIRUBIN TOTAL: 0.9 mg/dL (ref 0.3–1.2)
BUN: 20 mg/dL (ref 6–20)
CALCIUM: 8.7 mg/dL — AB (ref 8.9–10.3)
CO2: 28 mmol/L (ref 22–32)
CREATININE: 1.22 mg/dL — AB (ref 0.44–1.00)
Chloride: 102 mmol/L (ref 101–111)
GFR calc Af Amer: 55 mL/min — ABNORMAL LOW (ref >60)
GFR calc non Af Amer: 47 mL/min — ABNORMAL LOW (ref >60)
GLUCOSE: 168 mg/dL — AB (ref 65–99)
Potassium: 4.4 mmol/L (ref 3.5–5.1)
Sodium: 135 mmol/L (ref 135–145)
Total Protein: 7.8 g/dL (ref 6.5–8.1)

## 2014-10-05 LAB — CBC WITH DIFFERENTIAL/PLATELET
BASOS PCT: 0 %
Basophils Absolute: 0 10*3/uL (ref 0.0–0.1)
EOS ABS: 0.2 10*3/uL (ref 0.0–0.7)
EOS PCT: 2 %
HCT: 40.3 % (ref 36.0–46.0)
Hemoglobin: 12.9 g/dL (ref 12.0–15.0)
LYMPHS ABS: 2.7 10*3/uL (ref 0.7–4.0)
Lymphocytes Relative: 29 %
MCH: 28.3 pg (ref 26.0–34.0)
MCHC: 32 g/dL (ref 30.0–36.0)
MCV: 88.4 fL (ref 78.0–100.0)
MONO ABS: 0.6 10*3/uL (ref 0.1–1.0)
MONOS PCT: 7 %
Neutro Abs: 5.6 10*3/uL (ref 1.7–7.7)
Neutrophils Relative %: 62 %
Platelets: 189 10*3/uL (ref 150–400)
RBC: 4.56 MIL/uL (ref 3.87–5.11)
RDW: 13.5 % (ref 11.5–15.5)
WBC: 9.1 10*3/uL (ref 4.0–10.5)

## 2014-10-05 LAB — LIPASE, BLOOD: Lipase: 19 U/L — ABNORMAL LOW (ref 22–51)

## 2014-10-05 NOTE — ED Notes (Signed)
Pt states she had a liver biopsy on Tuesday and ever since then she has been having muscle spasms in her neck and abdominal area.

## 2014-10-05 NOTE — Discharge Instructions (Signed)

## 2014-10-05 NOTE — ED Provider Notes (Signed)
CSN: [id number]     Arrival date & time 10/05/14  1058 History  This chart was scribed for Sharlett Iles, MD by Stephania Fragmin, ED Scribe. This patient was seen in room APA16A/APA16A and the patient's care was started at 12:15 PM.   Chief Complaint  Patient presents with  . Neck Pain  . Abdominal Pain   The history is provided by the patient. No language interpreter was used.    HPI Comments: Kaylee Mitchell is a 59 y.o. female with a history of right breast cancer with bone metastasis, DM, HTN, and HLD, who presents to the Emergency Department complaining of upper back, neck, and diffuse abdominal that began after having a percutaneous liver biopsy 5 days ago. She states the pain began in her left posterior shoulder and gradually spread to her R shoulder, R side of neck over the past 5 days. She also complains of an associated, gradual-onset, throbbing, right-sided headache that began 2 days ago. Patient had seen her PCP 2 days ago and was prescribed Flexeril, but she states this provided no relief to her symptoms. Patient is followed by an oncologist and is taking oxycodone at home. She denies fever, vomiting, diarrhea, urinary symptoms, chest pain, or SOB.   Past Medical History  Diagnosis Date  . Hypertension   . High cholesterol   . Knee pain, bilateral 2013  . Cancer     rt breast is primary  . Bone metastasis 09/13/2010  . Diabetes mellitus     type II  . Depression   . Depression 12/14/2011  . Invasive ductal carcinoma of breast 09/13/2010   Past Surgical History  Procedure Laterality Date  . Knee surgery      right-arthroscopy  . Mastectomy      right side  . Abdominal hysterectomy     Family History  Problem Relation Age of Onset  . Arthritis    . Cancer    . Diabetes    . Anesthesia problems Neg Hx   . Hypotension Neg Hx   . Malignant hyperthermia Neg Hx   . Pseudochol deficiency Neg Hx   . Diabetes Mother   . Diabetes Father   . Cancer Maternal Aunt    Social  History  Substance Use Topics  . Smoking status: Never Smoker   . Smokeless tobacco: Never Used  . Alcohol Use: No   OB History    No data available     Review of Systems  A complete 10 system review of systems was obtained and all systems are negative except as noted in the HPI and PMH.     Allergies  Review of patient's allergies indicates no known allergies.  Home Medications   Prior to Admission medications   Medication Sig Start Date End Date Taking? Authorizing Provider  aspirin EC 81 MG tablet Take 81 mg by mouth daily.     Yes Historical Provider, MD  atorvastatin (LIPITOR) 20 MG tablet TAKE ONE TABLET BY MOUTH ONCE DAILY 03/27/14  Yes Alycia Rossetti, MD  calcium-vitamin D (OSCAL WITH D) 500-200 MG-UNIT per tablet Take 1 tablet by mouth 2 (two) times daily.    Yes Historical Provider, MD  carvedilol (COREG) 12.5 MG tablet TAKE ONE TABLET BY MOUTH TWICE DAILY WITH MEALS 08/19/13  Yes Alycia Rossetti, MD  cyclobenzaprine (FLEXERIL) 10 MG tablet Take 1 tablet (10 mg total) by mouth 3 (three) times daily as needed for muscle spasms. 10/01/14  Yes Baird Cancer, PA-C  exemestane (AROMASIN) 25 MG tablet Take 1 tablet (25 mg total) by mouth daily after breakfast. 11/19/13  Yes Farrel Gobble, MD  gabapentin (NEURONTIN) 300 MG capsule Take 300 mg by mouth 3 (three) times daily. 09/16/14  Yes Historical Provider, MD  Insulin Glargine (LANTUS) 100 UNIT/ML Solostar Pen Inject 60 Units into the skin at bedtime. 11/06/12  Yes Alycia Rossetti, MD  losartan-hydrochlorothiazide (HYZAAR) 50-12.5 MG per tablet Take 1 tablet by mouth daily. 01/14/14  Yes Historical Provider, MD  metFORMIN (GLUCOPHAGE) 500 MG tablet Take 500 mg by mouth 2 (two) times daily with a meal.   Yes Historical Provider, MD  Oxycodone HCl 10 MG TABS Take 1 tablet every 4 hours as needed for pain. 09/25/14  Yes Manon Hilding Kefalas, PA-C  potassium chloride SA (K-DUR,KLOR-CON) 20 MEQ tablet Take 1 tablet (20 mEq total) by  mouth daily. 12/17/13  Yes Manon Hilding Kefalas, PA-C  sertraline (ZOLOFT) 100 MG tablet Take 100 mg by mouth daily. 01/14/14  Yes Historical Provider, MD  venlafaxine XR (EFFEXOR XR) 150 MG 24 hr capsule Take take one capsule at bedtime nightly to control hot flashes. Patient taking differently: Take 150 mg by mouth daily as needed. Take take one capsule at bedtime nightly to control hot flashes. 07/02/13  Yes Farrel Gobble, MD  docusate sodium (STOOL SOFTENER) 100 MG capsule Take 100 mg by mouth daily as needed for mild constipation.     Historical Provider, MD  LORazepam (ATIVAN PO) Take 1 tablet by mouth daily as needed (anxiety).     Historical Provider, MD  omeprazole (PRILOSEC) 40 MG capsule TAKE 1 CAPSULE (40 MG TOTAL) BY MOUTH DAILY. Patient not taking: Reported on 10/05/2014 07/28/14   Patrici Ranks, MD  ondansetron (ZOFRAN) 8 MG tablet Take 1 tablet (8 mg total) by mouth every 8 (eight) hours as needed for nausea or vomiting. 03/07/14   Patrici Ranks, MD  Polyethylene Glycol 3350 (MIRALAX PO) Take 17 g by mouth daily as needed (constipation).     Historical Provider, MD  VENTOLIN HFA 108 (90 BASE) MCG/ACT inhaler Inhale 2 puffs into the lungs 4 (four) times daily as needed for wheezing or shortness of breath.  11/14/13   Historical Provider, MD   BP 131/86 mmHg  Pulse 99  Temp(Src) 98.3 F (36.8 C) (Oral)  Resp 20  Ht 5\' 5"  (1.651 m)  Wt 297 lb (134.718 kg)  BMI 49.42 kg/m2  SpO2 95% Physical Exam  Constitutional: She is oriented to person, place, and time. She appears well-developed and well-nourished. No distress.  HENT:  Head: Normocephalic and atraumatic.  Eyes: Conjunctivae and EOM are normal.  Neck: Neck supple. No tracheal deviation present.  Cardiovascular: Normal rate, regular rhythm and normal heart sounds.   No murmur heard. Pulmonary/Chest: Effort normal and breath sounds normal. No respiratory distress.  Abdominal: Soft. Bowel sounds are normal. She exhibits no  distension. There is no tenderness.  Healing biopsy site on RUQ.  Musculoskeletal: She exhibits tenderness.  5/5 strength and normal sensation x all 4 extremities. Tenderness to very light palpation of left and right trapezius muscles and right paracervical spinal muscles, with limited ROM of neck, secondary to pain.   Neurological: She is alert and oriented to person, place, and time. She has normal reflexes. She displays normal reflexes. No cranial nerve deficit. She exhibits normal muscle tone.  Normal finger-to-nose. Fluent speech.   Skin: Skin is warm and dry.  Psychiatric: She has a normal mood and affect.  Her behavior is normal.  Avoids eye contact.  Nursing note and vitals reviewed.   ED Course  Procedures (including critical care time)  DIAGNOSTIC STUDIES: Oxygen Saturation is 95% on RA, normal by my interpretation.    COORDINATION OF CARE: 12:24 PM - Suspect muscle spasms. Discussed treatment plan with pt at bedside which includes continuing Flexeril, applying a heating pad, and staying mobile. Will also perform basic tests. Pt verbalized understanding and agreed to plan.    Labs Review Labs Reviewed  COMPREHENSIVE METABOLIC PANEL - Abnormal; Notable for the following:    Glucose, Bld 168 (*)    Creatinine, Ser 1.22 (*)    Calcium 8.7 (*)    Albumin 3.3 (*)    GFR calc non Af Amer 47 (*)    GFR calc Af Amer 55 (*)    All other components within normal limits  LIPASE, BLOOD - Abnormal; Notable for the following:    Lipase 19 (*)    All other components within normal limits  CBC WITH DIFFERENTIAL/PLATELET  CALCIUM, IONIZED    Imaging Review No results found. I have personally reviewed and evaluated these lab results as part of my medical decision-making.    MDM   Final diagnoses:  Muscular cramp    59 year old female with metastatic breast cancer who presents with several days of muscle cramping in shoulders, neck, and intermittent gradual headache. Patient  well-appearing with reassuring vital signs at presentation. No significant abdominal tenderness on exam. Normal neurologic exam. Because of the patient's recent biopsy, obtained basic labs listed above to evaluate for any evidence of infection, transaminitis, or electrolyte abnormality to explain the patient's symptoms. Labs show stable creatinine at 1.2, calcium borderline low at 8.7, but otherwise reassuring labs. The patient has tenderness with very light touch of posterior shoulder and right paracervical muscle palpation, suggestive of muscle cramping/tightness. No reports of fever or vomiting to suggest systemic illness. No sudden onset of headache or neurologic complaints to suggest acute intracranial process. Instructed the patient on supportive care including heat therapy while awake, scheduled oxycodone and Tylenol, and range of motion exercises. Reviewed return precautions and also cautioned against sleeping with heating pad. Patient voiced understanding. Instructed to follow-up with PCP this week if her symptoms are not improved. All questions answered. Patient discharged in satisfactory condition.   I personally performed the services described in this documentation, which was scribed in my presence. The recorded information has been reviewed and is accurate.     Sharlett Iles, MD 10/05/14 1435

## 2014-10-07 LAB — CALCIUM, IONIZED: CALCIUM, IONIZED, SERUM: 5 mg/dL (ref 4.5–5.6)

## 2014-10-09 ENCOUNTER — Encounter (HOSPITAL_COMMUNITY): Payer: Self-pay | Admitting: Hematology & Oncology

## 2014-10-09 ENCOUNTER — Encounter (HOSPITAL_BASED_OUTPATIENT_CLINIC_OR_DEPARTMENT_OTHER): Payer: Medicare Other | Admitting: Hematology & Oncology

## 2014-10-09 VITALS — BP 141/91 | HR 110 | Temp 98.9°F | Resp 18 | Wt 295.0 lb

## 2014-10-09 DIAGNOSIS — C50911 Malignant neoplasm of unspecified site of right female breast: Secondary | ICD-10-CM | POA: Diagnosis not present

## 2014-10-09 DIAGNOSIS — C50919 Malignant neoplasm of unspecified site of unspecified female breast: Secondary | ICD-10-CM

## 2014-10-09 MED ORDER — EVEROLIMUS 10 MG PO TABS: 10.0000 mg | ORAL_TABLET | Freq: Every day | ORAL | Status: DC

## 2014-10-09 NOTE — Patient Instructions (Addendum)
[place] at [facility] Discharge Instructions  RECOMMENDATIONS MADE BY THE CONSULTANT AND ANY TEST RESULTS WILL BE SENT TO YOUR REFERRING PHYSICIAN.  Exam and discussion today with Dr. Whitney Muse. Prescription for Afinitor (Angie will get authorization and Hildred Alamin will call you for teaching). Return as scheduled in 2 weeks for office visit and injections.    Thank you for choosing [place] at [facility] to provide your oncology and hematology care.  To afford each patient quality time with our Marsden Zaino, please arrive at least 15 minutes before your scheduled appointment time.    You need to re-schedule your appointment should you arrive 10 or more minutes late.  We strive to give you quality time with our providers, and arriving late affects you and other patients whose appointments are after yours.  Also, if you no show three or more times for appointments you may be dismissed from the clinic at the providers discretion.     Again, thank you for choosing [facility].  Our hope is that these requests will decrease the amount of time that you wait before being seen by our physicians.       _____________________________________________________________  Should you have questions after your visit to [facility], please contact our office at [phone] between the hours of 8:30 a.m. and 4:30 p.m.  Voicemails left after 4:30 p.m. will not be returned until the following business day.  For prescription refill requests, have your pharmacy contact our office.

## 2014-10-09 NOTE — Progress Notes (Signed)
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 7 cm primary tumor, extensive LVI. With focal extenion to the anterior surface margin, 8 of 8 lymph nodes were involved with metastatic disease.    ER positive 100%, PR positive 99%, HER2 neu negative, Ki-67 marker 14%.   Mastectomy followed by radiation therapy to T4 through T7 due to severe involvement at this area.  Arimidex initially from 07/2009 through 03/23/2010 and was progressing by bone scan criteria. We switched her to tamoxifen.   Because of worsening bone pain, MRI of lumbar spine completed on 11/29/2012 demonstrated progressive diffuse lumbar spine metastatic disease and therefore therapy was changed to Fulvestrant and Exemestane on 12/11/2012, tolerating well except for vasomotor instability    Invasive ductal carcinoma of breast   05/26/2009 Initial Diagnosis Needle core biopsy showed invasive mammary carcinoma with 1 + lymph node   06/03/2009 Surgery Right modified radical mastectomy with 8/8 + lymph nodes by Dr. Ronnell Freshwater   07/22/2009 Cancer Staging Bone scan + for multiple osseous lesions.  No disease elsewhere.   07/24/2009 - 03/23/2010 Chemotherapy Arimidex   03/23/2010 Progression    03/23/2010 - 12/05/2012 Chemotherapy Tamoxifen   11/28/2012 Progression Worsening of lumbar spine osseous disease   12/06/2012 -  Chemotherapy Fulvestrant + Exemestane   01/24/2014 PET scan No evidence of hypermetabolic recurrent or metastatic disease. Widespread osseous metastasis, without significant marrow hypermetabolism.   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    CURRENT THERAPY: Faslodex/aromasin/Xgeva  INTERVAL HISTORY: Kaylee Mitchell 59 y.o. female returns for follow-up of her breast cancer, stage IV disease. She unfortunately has had a  rise in tumor markers and repeat imaging showed new hepatic metastases.  Discussed the patient's biopsy and treatment options.  She reports that after the biopsy, she experienced 'muscle spasms' in her head and had to go to the ER.  She was not given any medication to help with this.  She is now feeling better.  She is very close with her family and they are all aware of her disease.    She denies any new pain. She notes that the problems she experienced post biopsy are gone. She describes her appetite as unchanged.   MEDICAL HISTORY: Past Medical History  Diagnosis Date  . Hypertension   . High cholesterol   . Knee pain, bilateral 2013  . Cancer     rt breast is primary  . Bone metastasis 09/13/2010  . Diabetes mellitus     type II  . Depression   . Depression 12/14/2011  . Invasive ductal carcinoma of breast 09/13/2010    has Invasive ductal carcinoma of breast; Bone metastasis; Medial meniscus, posterior horn derangement; S/P arthroscopy of left knee; Difficulty in walking(719.7); Depression; Diabetes mellitus with chronic kidney disease; CKD (chronic kidney disease); Hyperlipidemia; Essential hypertension, benign; Morbid obesity; Tinea versicolor; Lumbar back pain; URI, acute; and Lesion of liver on her problem list.     has No Known Allergies.  We administered fulvestrant and denosumab.  SURGICAL HISTORY: Past Surgical History  Procedure Laterality Date  . Knee surgery      right-arthroscopy  . Mastectomy      right side  . Abdominal hysterectomy      SOCIAL HISTORY: Social History   Social History  . Marital Status: Single  Spouse Name: N/A  . Number of Children: N/A  . Years of Education: 12   Occupational History  . disabled    Social History Main Topics  . Smoking status: Never Smoker   . Smokeless tobacco: Never Used  . Alcohol Use: No  . Drug Use: No  . Sexual Activity: Yes    Birth Control/ Protection: Surgical   Other Topics Concern  . Not on  file   Social History Narrative    FAMILY HISTORY: Family History  Problem Relation Age of Onset  . Arthritis    . Cancer    . Diabetes    . Anesthesia problems Neg Hx   . Hypotension Neg Hx   . Malignant hyperthermia Neg Hx   . Pseudochol deficiency Neg Hx   . Diabetes Mother   . Diabetes Father   . Cancer Maternal Aunt     Review of Systems  Constitutional: Negative for weight loss and malaise/fatigue. Negative for fever, chills and diaphoresis.  HENT: Negative for congestion, ear discharge, ear pain, hearing loss, nosebleeds, sore throat and tinnitus.   Eyes: Negative.   Respiratory: Negative.  Negative for stridor.   Cardiovascular: Negative.   Gastrointestinal:nausea, diarrhea attributed to metformin Genitourinary: Negative.   Musculoskeletal:       Chronic bilateral leg pain R calf pain Skin: Negative.   Neurological: Negative for dizziness, tingling, tremors, sensory change, speech change, focal weakness, seizures, headaches, loss of consciousness and weakness.  Endo/Heme/Allergies: Negative.   Psychiatric/Behavioral: Negative.   14 point review of systems was performed and is negative except as detailed under history of present illness and above   PHYSICAL EXAMINATION  ECOG PERFORMANCE STATUS: 1 - Symptomatic but completely ambulatory  Filed Vitals:   10/09/14 0850  BP: 141/91  Pulse: 110  Temp: 98.9 F (37.2 C)  Resp: 18    Physical Exam  Constitutional: She is oriented to person, place, and time and well-developed, well-nourished, and in no distress.  Obese, cane  HENT:  Head: Normocephalic and atraumatic.  Nose: Nose normal.  Mouth/Throat: Oropharynx is clear and moist. No oropharyngeal exudate.  Eyes: Conjunctivae and EOM are normal. Pupils are equal, round, and reactive to light. Right eye exhibits no discharge. Left eye exhibits no discharge. No scleral icterus.  Neck: Normal range of motion. Neck supple. No tracheal deviation present. No  thyromegaly present.  Cardiovascular: Normal rate, regular rhythm and normal heart sounds.  Exam reveals no gallop and no friction rub.   No murmur heard. Pulmonary/Chest: Effort normal and breath sounds normal. She has no wheezes. She has no rales.  Left chest wall with prior mastectomy, incision intact, no nodularity or concerning skin changes  Abdominal: Soft. Bowel sounds are normal. She exhibits no distension and no mass. There is no tenderness. There is no rebound and no guarding. Biopsy site is barely noticeable Musculoskeletal: Normal range of motion. She exhibits no edema.  Lymphadenopathy:    She has no cervical adenopathy.  Neurological: She is alert and oriented to person, place, and time. She has normal reflexes. No cranial nerve deficit. Gait normal. Coordination normal.  Skin: Skin is warm and dry. No rash noted.  Psychiatric: Mood, memory, affect and judgment normal.  Nursing note and vitals reviewed.   LABORATORY DATA: I have reviewed the results below as listed.    INDICATION: History of breast cancer, now with new hypermetabolic liver lesions worrisome for metastatic disease. Please perform CT-guided biopsy for tissue diagnostic purposes  EXAM: CT-GUIDED BIOPSY OF  DOMINANT INDETERMINATE MASS WITHIN ANTERIOR SEGMENT OF THE RIGHT LOBE OF THE LIVER  COMPARISON: PET-CT - 09/18/2014; 01/24/2014  MEDICATIONS: Fentanyl 100 mcg IV; Versed 2 mg IV  ANESTHESIA/SEDATION: Sedation time  12 minutes  CONTRAST: None  COMPLICATIONS: None immediate  PROCEDURE: Informed consent was obtained from the patient following an explanation of the procedure, risks, benefits and alternatives. A time out was performed prior to the initiation of the procedure.  The patient was positioned supine on the CT table and a limited CT was performed for procedural planning demonstrating the dominant ill-defined hypo attenuating mass within the anterior segment of the right  lobe of the liver with dominant ill-defined component measuring approximately 7.5 x 6.9 cm (image 30, series 2). The procedure was planned. The operative site was prepped and draped in the usual sterile fashion. Appropriate trajectory was confirmed with a 22 gauge spinal needle after the adjacent tissues were anesthetized with 1% Lidocaine with epinephrine.  Under intermittent CT guidance, a 17 gauge coaxial needle was advanced into the peripheral aspect of the mass. Appropriate positioning was confirmed and 5 core needle biopsy samples were obtained with an 18 gauge core needle biopsy device. The co-axial needle was removed and hemostasis was achieved with manual compression.  A limited postprocedural CT was negative for hemorrhage or additional complication. A dressing was placed. The patient tolerated the procedure well without immediate postprocedural complication.  IMPRESSION: Technically successful CT guided core needle biopsy of ill-defined dominant mass within the anterior segment of the right lobe of the liver.   Electronically Signed  By: Sandi Mariscal M.D.  On: 09/30/2014 12:37  PATHOLOGY: PORT OF SURGICAL PATHOLOGY ADDITIONAL INFORMATION: FLUORESCENCE IN-SITU HYBRIDIZATION Results: HER2 - NEGATIVE RATIO OF HER2/CEP17 SIGNALS 1.18 AVERAGE HER2 COPY NUMBER PER CELL 1.95 Reference Range: NEGATIVE HER2/CEP17 Ratio <2.0 and average HER2 copy number <4.0 EQUIVOCAL HER2/CEP17 Ratio <2.0 and average HER2 copy number >=4.0 and <6.0 POSITIVE HER2/CEP17 Ratio >=2.0 or <2.0 and average HER2 copy number >=6.0 Enid Cutter MD Pathologist, Electronic Signature ( Signed 10/07/2014) PROGNOSTIC INDICATORS Results: IMMUNOHISTOCHEMICAL AND MORPHOMETRIC ANALYSIS PERFORMED MANUALLY Estrogen Receptor: 100%, POSITIVE, STRONG STAINING INTENSITY Progesterone Receptor: 90%, POSITIVE, STRONG STAINING INTENSITY REFERENCE RANGE ESTROGEN RECEPTOR NEGATIVE 0% POSITIVE  =>1% REFERENCE RANGE PROGESTERONE RECEPTOR NEGATIVE 0% POSITIVE =>1% All controls stained appropriately 1 of 3 FINAL for TERRAH, DECOSTER ([id number]) ADDITIONAL INFORMATION:(continued) Mali RUND DO Pathologist, Electronic Signature    ASSESSMENT and THERAPY PLAN:  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 She unfortunately now has new liver metastases. I am not sure at this point if she would respond to Aromasin/Afinitor, but since she has minimal liver disease and is asymptomatic I think it is reasonable to try. We will order a short interval PET scan to assess for response. If there is any evidence of progressive disease at that point, our only remaining options are chemotherapy based.  I discussed with the patient end-of-life issues briefly. She states she does speak to her family occasionally, she notes everyone is aware that she will eventually die from her cancer. We need to address advanced directives and also DO NOT RESUSCITATE status moving forward.  I will plan on seeing her back in 2 weeks. We have written a prescription for Afinitor. We will do education on  Afinitor.  All questions were answered. The patient knows to call the clinic with any problems, questions or concerns. We can certainly see the patient much sooner if necessary.    This document serves as a record of services personally performed by Ancil Linsey, MD. It was created on her behalf by Janace Hoard, a trained medical scribe. The creation of this record is based on the scribe's personal observations and the provider's statements to them. This document has been checked and approved by the attending provider.  I have reviewed the above documentation for accuracy and completeness,  and I agree with the above.  This note was electronically signed.  Kelby Fam. Whitney Muse, MD

## 2014-10-14 ENCOUNTER — Other Ambulatory Visit (HOSPITAL_COMMUNITY): Payer: Self-pay

## 2014-10-17 ENCOUNTER — Telehealth (HOSPITAL_COMMUNITY): Payer: Self-pay

## 2014-10-17 NOTE — Telephone Encounter (Signed)
Left message for Kaylee Mitchell to call office.  Dr. Whitney Muse wants her to start the Afinitor and Exemestane.

## 2014-10-17 NOTE — Telephone Encounter (Signed)
-----   Message from Louis Meckel sent at 10/17/2014 10:11 AM EDT ----- Regarding: med Contact: [phone] Please call patient to find out when to start new med she got in the mail

## 2014-10-20 ENCOUNTER — Encounter (HOSPITAL_COMMUNITY): Payer: Medicare Other | Attending: Oncology

## 2014-10-20 ENCOUNTER — Telehealth (HOSPITAL_COMMUNITY): Payer: Self-pay

## 2014-10-20 DIAGNOSIS — C50919 Malignant neoplasm of unspecified site of unspecified female breast: Secondary | ICD-10-CM | POA: Insufficient documentation

## 2014-10-20 DIAGNOSIS — C7951 Secondary malignant neoplasm of bone: Secondary | ICD-10-CM | POA: Insufficient documentation

## 2014-10-20 NOTE — Telephone Encounter (Signed)
Per Dr. Whitney Muse, patient notified to start the Afinitor and take it with the Exemestane and we will see her on Friday to see how she's doing.  Verbalized understanding of instructions.

## 2014-10-20 NOTE — Progress Notes (Signed)
Patient received Afinitor a couple of days ago in the mail but just started it today. I will document that in the medication section. I reviewed Afinitor again with her today. She read the pamphlet that came with the medicine also. She verbalized that this medication can cause mouth sores and diarrhea and said she is to call if she gets either. Patient also instructed to avoid live vaccines and to notify us if she thinks she needs to get a vaccine. She said ok. Patient is aware that she needs to avoid grapefruits & grapefruit juice. She also understands that she can take this with or without food but to which way she is going to take it and stick to that method (with or without food). Patient to come in on 10/7 Friday. She will sign consent for Afinitor and pick up Afinitor Counseling Kit.

## 2014-10-20 NOTE — Telephone Encounter (Signed)
-----   Message from Louis Meckel sent at 10/17/2014 10:11 AM EDT ----- Regarding: med Contact: [phone] Please call patient to find out when to start new med she got in the mail

## 2014-10-20 NOTE — Patient Instructions (Addendum)
[place]   CHEMOTHERAPY INSTRUCTIONS  Afinitor - (Everolimus/Afinitor) is part of a class of drugs called mTOR (mammalian target of rapamycin) inhibitors. It may slow the growth and spread of advanced breast cancer.  Common side effects include mouth ulcers. AFINITOR can cause mouth ulcers and sores. Tell your health care provider if you have pain, discomfort, or open sores in your mouth. Your health care provider may tell you to use a special mouthwash or gel that does not contain alcohol, hydrogen peroxide, iodine, or thyme.  Other common side effects: Infections, feeling weak or tired, cough, shortness of breath, diarrhea/constipation, skin problems (such as rash, acne, dry skin, or itching), nausea/vomiting, fever, loss of appetite, weight loss, swelling of arms, hands, feet, ankles, face, or other parts of the body, abnormal taste or dry mouth, inflammation of the lining of the digestive system, headache, nose bleeds, pain in arms and legs, mouth and throat, back or joints, or stomach area (abdomen), low red blood cells, white blood cells, or platelets, increased blood cholesterol levels and certain other blood levels, decreased blood phosphate levels, increased blood sugar levels, high blood pressure, difficulty sleeping, hair loss, muscle spasms, feeling dizzy, nail disorders, anxiety, aggression, and other abnormal behaviors   *Afinitor 58m tablet. Take 1 tablet daily with or without food. * Do not drink grapefruit juice or eat grapefruits while taking this medication. The grapefruit could increase the Afinitor levels in your blood to a harmful level.   Avoid live vaccines and close contact with those who have received li  ve vaccines.   Most common adverse reactions: mouth sores, infections, rash, fatigue, diarrhea, edema, abdominal pain, nausea, fever, cough, headache and decreased appetite.    POTENTIAL SIDE EFFECTS OF TREATMENT: Increased  Susceptibility to Infection, Vomiting, Constipation, Hair Thinning, Bone Marrow Suppression, Nausea, Diarrhea, Mouth Sores     EDUCATIONAL MATERIALS GIVEN AND REVIEWED: Specific Instructions Sheets: ABuilding services engineerCounseling Kit given   SELF CARE ACTIVITIES WHILE ON CHEMOTHERAPY: No alcohol intake., No aspirin or other medications unless approved by your oncologist., Eat foods that are light and easy to digest., Eat foods at cold or room temperature., No fried, fatty, or spicy foods immediately before or after treatment., Have teeth cleaned professionally before starting treatment. Keep dentures and partial plates clean., Use soft toothbrush and do not use mouthwashes that contain alcohol. Biotene is a good mouthwash that is available at most pharmacies or may be ordered by calling ([phone], Use warm salt water gargles (1 teaspoon salt per 1 quart warm water) before and after meals and at bedtime. Or you may rinse with 2 tablespoons of three -percent hydrogen peroxide mixed in eight ounces of water., Always use sunscreen with SPF (Sun Protection Factor) of 30 or higher., Use your nausea medication as directed to prevent nausea., Use your stool softener or laxative as directed to prevent constipation. and Use your anti-diarrheal medication as directed to stop diarrhea.  Please wash your hands for at least 30 seconds using warm soapy water. Handwashing is the #1 way to prevent the spread of germs. Stay away from sick people or people who are getting over a cold. If you develop respiratory systems such as green/yellow mucus production or productive cough or persistent cough let u[place]oreaknow and we will see if you need an antibiotic. It is a good idea to keep a pair of gloves on when going into grocery stores/[employer] to decrease your risk of coming into contact with germs on  the carts, etc. Carry alcohol hand gel with you at all times and use it frequently if out in public. All foods need to be cooked  thoroughly. No raw foods. No medium or undercooked meats, eggs. If your food is cooked medium well, it does not need to be hot pink or saturated with bloody liquid at all. Vegetables and fruits need to be washed/rinsed under the faucet with a dish detergent before being consumed. You can eat raw fruits and vegetables unless we tell you otherwise but it would be best if you cooked them or bought frozen. Do not eat off of salad bars or hot bars unless you really trust the cleanliness of the restaurant. If you need dental work, please let Dr. Whitney Muse know before you go for your appointment so that we can coordinate the best possible time for you in regards to your chemo regimen. You need to also let your dentist know that you are actively taking chemo. We may need to do labs prior to your dental appointment. We also want your bowels moving at least every other day. If this is not happening, we need to know so that we can get you on a bowel regimen to help you go.     MEDICATIONS: You have been given prescriptions for the following medications:  Zofran 48m tablet. Take 1 tablet every 8 hours as needed for nausea/vomiting. (this can constipate)   Over-the-Counter Meds:  Miralax 1 capful in 8 oz of fluid daily. May increase to two times a day if needed. This is a stool softener. If this doesn't work proceed you can add:  Senokot S  - start with 1 tablet two times a day and increase to 4 tablets two times a day if needed. (total of 8 tablets in a 24 hour period). This is a stimulant laxative.   Call uKoreaif this does not help your bowels move.   Imodium 211mcapsule. Take 2 capsules after the 1st loose stool and then 1 capsule every 2 hours until you go a total of 12 hours without having a loose stool. Call the [facility] loose stools continue.    SYMPTOMS TO REPORT AS SOON AS POSSIBLE AFTER TREATMENT:  FEVER GREATER THAN 100.5 F  CHILLS WITH OR WITHOUT FEVER  NAUSEA AND VOMITING THAT IS NOT  CONTROLLED WITH YOUR NAUSEA MEDICATION  UNUSUAL SHORTNESS OF BREATH  UNUSUAL BRUISING OR BLEEDING  TENDERNESS IN MOUTH AND THROAT WITH OR WITHOUT PRESENCE OF ULCERS  URINARY PROBLEMS  BOWEL PROBLEMS  UNUSUAL RASH    Wear comfortable clothing and clothing appropriate for easy access to any Portacath or PICC line. Let usKoreanow if there is anything that we can do to make your therapy better!      I have been informed and understand all of the instructions given to me and have received a copy. I have been instructed to call the clinic ([phone] my family physician as soon as possible for continued medical care, if indicated. I do not have any more questions at this time but understand that I may call the [facility] the Patient Navigator at ([phone] office hours should I have questions or need assistance in obtaining follow-up care.          Everolimus tablets What is this medicine? EVEROLIMUS (eve ROE li mus) decreases the activity of your immune system. Afinitor is used to treat certain types of cancer. Zortress is used for kidney and  liver transplant rejection prophylaxis. This medicine may be used for other purposes; ask your health care provider or pharmacist if you have questions. COMMON BRAND NAME(S): Afinitor, Zortress What should I tell my health care provider before I take this medicine? They need to know if you have any of these conditions: -diabetes -heart disease -high cholesterol -immune system problems -infection (especially a virus infection such as chickenpox, cold sores, or herpes) -kidney disease -liver disease -low blood counts, like low white cell, platelet, or red cell counts -rare hereditary problems of galactose intolerance, the Lapp lactase deficiency, or glucose-galactose malabsorption -an unusual or allergic reaction to everolimus, other medicines, foods, dyes, or preservatives -pregnant or trying to get  pregnant -breast-feeding How should I use this medicine? Take this medicine by mouth with a full glass of water. Follow the directions on the prescription label. You can take this medicine with or without food, but always take Zortress the same way. Do not cut, crush, or chew this medicine. Do not take with grapefruit juice. Take your medicine at regular intervals. Do not take it more often than directed. Do not stop taking except on your doctor's advice. Talk to your pediatrician regarding the use of this medicine in children. Special care may be needed. Overdosage: If you think you've taken too much of this medicine contact a poison control center or emergency room at once. Overdosage: If you think you have taken too much of this medicine contact a poison control center or emergency room at once. NOTE: This medicine is only for you. Do not share this medicine with others. What if I miss a dose? If you miss a dose, take it as soon as you can. If it is almost time for your next dose, take only that dose. Do not take double or extra doses. What may interact with this medicine? This medicine may interact with the following medications: -antiviral medicines for HIV or AIDS -aprepitant -carbamazepine -certain medicines for cholesterol like simvastatin -clarithromycin -cyclosporine -dexamethasone -diltiazem -erythromycin -fluconazole -grapefruit juice -itraconazole -ketoconazole -live vaccines -nefazodone -nicardipine -phenobarbital -phenytoin -rifabutin -rifampin -telithromycin -verapamil -voriconazole This list may not describe all possible interactions. Give your health care provider a list of all the medicines, herbs, non-prescription drugs, or dietary supplements you use. Also tell them if you smoke, drink alcohol, or use illegal drugs. Some items may interact with your medicine. What should I watch for while using this medicine? Visit your doctor or health care professional for  regular check-ups. You may need regular tests to monitor possible side effects of the drug. This medicine may affect blood sugar levels. If you have diabetes, check with your doctor or health care professional before you change your diet or the dose of your diabetic medicine. Women should inform their doctor if they wish to become pregnant or think they might be pregnant. There is a potential for serious side effects to an unborn child. Talk to your health care professional or pharmacist for more information. Do not breast-feed an infant while taking this medicine. Afinitor may make you feel generally unwell. This is not uncommon, as chemotherapy can affect healthy cells as well as cancer cells. Report any side effects. Continue your course of treatment even though you feel ill unless your doctor tells you to stop. Call your doctor or health care professional for advice if you get a fever, chills or sore throat, or other symptoms of a cold or flu. Do not treat yourself. This drug decreases your body's ability to fight  infections. Try to avoid being around people who are sick. This medicine may increase your risk to bruise or bleed. Call your doctor or health care professional if you notice any unusual bleeding. Be careful brushing and flossing your teeth or using a toothpick because you may get an infection or bleed more easily. If you have any dental work done, tell your dentist you are receiving this medicine. Avoid taking products that contain aspirin, acetaminophen, ibuprofen, naproxen, or ketoprofen unless instructed by your doctor. These medicines may hide a fever. If you have had a kidney transplant, immediately tell your doctor if your incision site is red, warm, or painful. Also, tell your doctor if your incision site opens up or swells or if contains blood, fluid, or pus. Keep out of the sun. If you cannot avoid being in the sun, wear protective clothing and use sunscreen. Do not use sun lamps or  tanning beds/booths. What side effects may I notice from receiving this medicine? Side effects that you should report to your doctor or health care professional as soon as possible: -allergic reactions like skin rash, itching or hives, swelling of the face, lips, or tongue -breathing problems -chest pain -cough -dark urine -fever or chills, sore throat -increased hunger or thirst -increased urination -nausea, vomiting -stomach pain -swelling of ankles, feet, hands -trouble passing urine or change in the amount of urine -unusual bleeding or bruising -unusually weak or tired Side effects that usually do not require medical attention (Report these to your doctor or health care professional if they continue or are bothersome.): -constipation -diarrhea -dizziness -dry mouth or mouth sores -headache -nausea, vomiting This list may not describe all possible side effects. Call your doctor for medical advice about side effects. You may report side effects to FDA at 1-800-FDA-1088. Where should I keep my medicine? Keep out of the reach of children. Store at room temperature between 15 and 30 degrees C (59 and 86 degrees F). Throw away any unused medicine after the expiration date. NOTE: This sheet is a summary. It may not cover all possible information. If you have questions about this medicine, talk to your doctor, pharmacist, or health care provider.  2015, Elsevier/[employer] Standard. (2011-03-08 12:04:14) Exemestane tablets What is this medicine? EXEMESTANE (ex e MES tane) blocks the production of the hormone estrogen. Some types of breast cancer depend on estrogen to grow, and this medicine can stop tumor growth by blocking estrogen production. This medicine is for the treatment of breast cancer in postmenopausal women only. This medicine may be used for other purposes; ask your health care provider or pharmacist if you have questions. COMMON BRAND NAME(S): Aromasin What should I tell my  health care provider before I take this medicine? They need to know if you have any of these conditions: -an unusual or allergic reaction to exemestane, other medicines, foods, dyes, or preservatives -pregnant or trying to get pregnant -breast-feeding How should I use this medicine? Take this medicine by mouth with a glass of water. Follow the directions on the prescription label. Take your doses at regular intervals after a meal. Do not take your medicine more often than directed. Do not stop taking except on the advice of your doctor or health care professional. Contact your pediatrician regarding the use of this medicine in children. Special care may be needed. Overdosage: If you think you have taken too much of this medicine contact a poison control center or emergency room at once. NOTE: This medicine is only for you.  Do not share this medicine with others. What if I miss a dose? If you miss a dose, take the next dose as usual. Do not try to make up the missed dose. Do not take double or extra doses. What may interact with this medicine? Do not take this medicine with any of the following medications: -female hormones, like estrogens and birth control pills This medicine may also interact with the following medications: -androstenedione -phenytoin -rifabutin, rifampin, or rifapentine -St. John's Wort This list may not describe all possible interactions. Give your health care provider a list of all the medicines, herbs, non-prescription drugs, or dietary supplements you use. Also tell them if you smoke, drink alcohol, or use illegal drugs. Some items may interact with your medicine. What should I watch for while using this medicine? Visit your doctor or health care professional for regular checks on your progress. If you experience hot flashes or sweating while taking this medicine, avoid alcohol, smoking and drinks with caffeine. This may help to decrease these side effects. What side  effects may I notice from receiving this medicine? Side effects that you should report to your doctor or health care professional as soon as possible: -any new or unusual symptoms -changes in vision -fever -leg or arm swelling -pain in bones, joints, or muscles -pain in hips, back, ribs, arms, shoulders, or legs Side effects that usually do not require medical attention (report to your doctor or health care professional if they continue or are bothersome): -difficulty sleeping -headache -hot flashes -sweating -unusually weak or tired This list may not describe all possible side effects. Call your doctor for medical advice about side effects. You may report side effects to FDA at 1-800-FDA-1088. Where should I keep my medicine? Keep out of the reach of children. Store at room temperature between 15 and 30 degrees C (59 and 86 degrees F). Throw away any unused medicine after the expiration date. NOTE: This sheet is a summary. It may not cover all possible information. If you have questions about this medicine, talk to your doctor, pharmacist, or health care provider.  2015, Elsevier/Gold Standard. (2007-05-08 11:48:29)

## 2014-10-24 ENCOUNTER — Encounter (HOSPITAL_COMMUNITY): Payer: Medicare Other

## 2014-10-24 ENCOUNTER — Encounter (HOSPITAL_COMMUNITY): Payer: Self-pay | Admitting: Oncology

## 2014-10-24 ENCOUNTER — Other Ambulatory Visit (HOSPITAL_COMMUNITY): Payer: Self-pay | Admitting: Oncology

## 2014-10-24 ENCOUNTER — Encounter (HOSPITAL_BASED_OUTPATIENT_CLINIC_OR_DEPARTMENT_OTHER): Payer: Medicare Other

## 2014-10-24 ENCOUNTER — Encounter (HOSPITAL_BASED_OUTPATIENT_CLINIC_OR_DEPARTMENT_OTHER): Payer: Medicare Other | Admitting: Oncology

## 2014-10-24 VITALS — BP 142/82 | HR 105 | Temp 98.2°F | Resp 18 | Wt 294.5 lb

## 2014-10-24 DIAGNOSIS — C50211 Malignant neoplasm of upper-inner quadrant of right female breast: Secondary | ICD-10-CM | POA: Diagnosis not present

## 2014-10-24 DIAGNOSIS — C50919 Malignant neoplasm of unspecified site of unspecified female breast: Secondary | ICD-10-CM

## 2014-10-24 DIAGNOSIS — C7951 Secondary malignant neoplasm of bone: Secondary | ICD-10-CM

## 2014-10-24 DIAGNOSIS — M79604 Pain in right leg: Secondary | ICD-10-CM

## 2014-10-24 DIAGNOSIS — Z79891 Long term (current) use of opiate analgesic: Secondary | ICD-10-CM

## 2014-10-24 DIAGNOSIS — Z1239 Encounter for other screening for malignant neoplasm of breast: Secondary | ICD-10-CM

## 2014-10-24 HISTORY — DX: Long term (current) use of opiate analgesic: Z79.891

## 2014-10-24 LAB — CBC WITH DIFFERENTIAL/PLATELET
Basophils Absolute: 0 10*3/uL (ref 0.0–0.1)
Basophils Relative: 1 %
Eosinophils Absolute: 0.2 10*3/uL (ref 0.0–0.7)
Eosinophils Relative: 4 %
HEMATOCRIT: 40.9 % (ref 36.0–46.0)
HEMOGLOBIN: 13 g/dL (ref 12.0–15.0)
LYMPHS ABS: 2.7 10*3/uL (ref 0.7–4.0)
LYMPHS PCT: 43 %
MCH: 28.4 pg (ref 26.0–34.0)
MCHC: 31.8 g/dL (ref 30.0–36.0)
MCV: 89.5 fL (ref 78.0–100.0)
MONO ABS: 0.5 10*3/uL (ref 0.1–1.0)
MONOS PCT: 7 %
NEUTROS ABS: 2.8 10*3/uL (ref 1.7–7.7)
NEUTROS PCT: 45 %
Platelets: 191 10*3/uL (ref 150–400)
RBC: 4.57 MIL/uL (ref 3.87–5.11)
RDW: 13.9 % (ref 11.5–15.5)
WBC: 6.2 10*3/uL (ref 4.0–10.5)

## 2014-10-24 LAB — COMPREHENSIVE METABOLIC PANEL
ALBUMIN: 3.4 g/dL — AB (ref 3.5–5.0)
ALK PHOS: 116 U/L (ref 38–126)
ALT: 37 U/L (ref 14–54)
ANION GAP: 7 (ref 5–15)
AST: 36 U/L (ref 15–41)
BILIRUBIN TOTAL: 0.5 mg/dL (ref 0.3–1.2)
BUN: 25 mg/dL — ABNORMAL HIGH (ref 6–20)
CALCIUM: 8.5 mg/dL — AB (ref 8.9–10.3)
CO2: 28 mmol/L (ref 22–32)
Chloride: 105 mmol/L (ref 101–111)
Creatinine, Ser: 1.4 mg/dL — ABNORMAL HIGH (ref 0.44–1.00)
GFR, EST AFRICAN AMERICAN: 46 mL/min — AB (ref >60)
GFR, EST NON AFRICAN AMERICAN: 40 mL/min — AB (ref >60)
GLUCOSE: 170 mg/dL — AB (ref 65–99)
Potassium: 4.3 mmol/L (ref 3.5–5.1)
Sodium: 140 mmol/L (ref 135–145)
TOTAL PROTEIN: 7.5 g/dL (ref 6.5–8.1)

## 2014-10-24 MED ORDER — DEXAMETHASONE 0.5 MG/5ML PO SOLN: 1.0000 mg | Freq: Four times a day (QID) | ORAL | Status: DC

## 2014-10-24 MED ORDER — DENOSUMAB 120 MG/1.7ML ~~LOC~~ SOLN
120.0000 mg | Freq: Once | SUBCUTANEOUS | Status: AC
  Administered 2014-10-24: 120 mg via SUBCUTANEOUS
  Filled 2014-10-24: qty 1.7

## 2014-10-24 MED ORDER — OXYCODONE HCL 10 MG PO TABS: ORAL_TABLET | ORAL | Status: DC

## 2014-10-24 NOTE — Progress Notes (Signed)
Kaylee Bellow, MD [place] [address]  Invasive ductal carcinoma of breast, unspecified laterality ([place]) - Plan: dexamethasone (DECADRON) 0.5 MG/5ML solution, CBC with Differential, Comprehensive metabolic panel, DNR (Do Not Resuscitate)  Opioid contract exists with [facility]  Bone metastasis ([place]) - Plan: Oxycodone HCl 10 MG TABS  CURRENT THERAPY: Afinitor + Exemestane  INTERVAL HISTORY: Kaylee Mitchell 59 y.o. female returns for followup of Stage IV breast cancer, ER/PR+, HER2 neg, with new hepatic metastasis (biopsy proven).    Invasive ductal carcinoma of breast ([place])   05/26/2009 Initial Diagnosis Needle core biopsy showed invasive mammary carcinoma with 1 + lymph node   06/03/2009 Surgery Right modified radical mastectomy with 8/8 + lymph nodes by Dr. Ronnell Mitchell   07/22/2009 Cancer Staging Bone scan + for multiple osseous lesions.  No disease elsewhere.   07/24/2009 - 03/23/2010 Chemotherapy Arimidex   03/23/2010 Progression    03/23/2010 - 12/05/2012 Chemotherapy Tamoxifen   11/28/2012 Progression Worsening of lumbar spine osseous disease   12/06/2012 - 09/25/2014 Chemotherapy Fulvestrant + Exemestane   01/24/2014 PET scan No evidence of hypermetabolic recurrent or metastatic disease. Widespread osseous metastasis, without significant marrow hypermetabolism.   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   10/20/2014 -  Chemotherapy Afinitor + Exemestane   I personally reviewed and went over laboratory results with the patient.  The results are noted within this dictation.  She recently started Afinitor.  She is given an Afinitor starter pack containing alcohol-free mouthwash and soft toothbrush with toothpaste.  I provided the patient education regarding Afinitor-induced stomatitis.  I provided her education regarding preventative intervention with good mouth care  containing non-alcohol products.  I provided her information regarding the SWISH trial as well.  Details follow below in A/P.  She is educated that most instances of stomatitis occurs within the first 8 weeks.  She is tolerating therapy well thus far, but it is very early in her treatment course.  We had a conversation regarding code status.  She made her wishes very clear.  Past Medical History  Diagnosis Date  . Hypertension   . High cholesterol   . Knee pain, bilateral 2013  . Cancer ([place])     rt breast is primary  . Bone metastasis ([place]) 09/13/2010  . Diabetes mellitus     type II  . Depression   . Depression 12/14/2011  . Invasive ductal carcinoma of breast ([place]) 09/13/2010  . Opioid contract exists with [facility] 10/24/2014    Contract found in Chart review in letter tab.    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     has No Known Allergies.  Current Outpatient Prescriptions on File Prior to Visit  Medication Sig Dispense Refill  . aspirin EC 81 MG tablet Take 81 mg by mouth daily.      Marland Kitchen atorvastatin (LIPITOR) 20 MG tablet TAKE ONE TABLET BY MOUTH ONCE DAILY 30 tablet 0  . calcium-vitamin D (OSCAL WITH D) 500-200 MG-UNIT per tablet Take 1 tablet by mouth 2 (  two) times daily.     . carvedilol (COREG) 12.5 MG tablet TAKE ONE TABLET BY MOUTH TWICE DAILY WITH MEALS 180 tablet 0  . cyclobenzaprine (FLEXERIL) 10 MG tablet Take 1 tablet (10 mg total) by mouth 3 (three) times daily as needed for muscle spasms. 10 tablet 0  . docusate  sodium (STOOL SOFTENER) 100 MG capsule Take 100 mg by mouth daily as needed for mild constipation.     Marland Kitchen everolimus (AFINITOR) 10 MG tablet Take 1 tablet (10 mg total) by mouth daily. 30 tablet 2  . exemestane (AROMASIN) 25 MG tablet Take 1 tablet (25 mg total) by mouth daily after breakfast. 90 tablet 3  . gabapentin (NEURONTIN) 300 MG capsule Take 300 mg by mouth 3 (three) times daily.  3  . Insulin Glargine (LANTUS) 100 UNIT/ML Solostar Pen Inject 60 Units into the skin at bedtime.    Marland Kitchen losartan-hydrochlorothiazide (HYZAAR) 50-12.5 MG per tablet Take 1 tablet by mouth daily.    . metFORMIN (GLUCOPHAGE) 500 MG tablet Take 500 mg by mouth 2 (two) times daily with a meal.    . ondansetron (ZOFRAN) 8 MG tablet Take 1 tablet (8 mg total) by mouth every 8 (eight) hours as needed for nausea or vomiting. 30 tablet 3  . Polyethylene Glycol 3350 (MIRALAX PO) Take 17 g by mouth daily as needed (constipation).     . potassium chloride SA (K-DUR,KLOR-CON) 20 MEQ tablet Take 1 tablet (20 mEq total) by mouth daily. 90 tablet 4  . sertraline (ZOLOFT) 100 MG tablet Take 100 mg by mouth daily.    Marland Kitchen venlafaxine XR (EFFEXOR XR) 150 MG 24 hr capsule Take take one capsule at bedtime nightly to control hot flashes. (Patient taking differently: Take 150 mg by mouth daily as needed. Take take one capsule at bedtime nightly to control hot flashes.) 60 capsule 6  . VENTOLIN HFA 108 (90 BASE) MCG/ACT inhaler Inhale 2 puffs into the lungs 4 (four) times daily as needed for wheezing or shortness of breath.   0  . LORazepam (ATIVAN PO) Take 1 tablet by mouth daily as needed (anxiety).     Marland Kitchen omeprazole (PRILOSEC) 40 MG capsule TAKE 1 CAPSULE (40 MG TOTAL) BY MOUTH DAILY. (Patient not taking: Reported on 10/05/2014) 30 capsule 3  . [DISCONTINUED] loratadine (CLARITIN) 10 MG tablet Take 10 mg by mouth daily as needed. For allergies     Current Facility-Administered Medications on File Prior to Visit  Medication Dose Route  Frequency Provider Last Rate Last Dose  . 1 mL EPINEPHrine 1 mg/mL (1:1000) in 0.9% Normal Saline 3000 mL irrigation    PRN Carole Civil, MD        Past Surgical History  Procedure Laterality Date  . Knee surgery      right-arthroscopy  . Mastectomy      right side  . Abdominal hysterectomy      Denies any headaches, dizziness, double vision, fevers, chills, night sweats, nausea, vomiting, diarrhea, constipation, chest pain, heart palpitations, shortness of breath, blood in stool, black tarry stool, urinary pain, urinary burning, urinary frequency, hematuria.   PHYSICAL EXAMINATION  ECOG PERFORMANCE STATUS: 1 - Symptomatic but completely ambulatory  Filed Vitals:   10/24/14 0824  BP: 142/82  Pulse: 105  Temp: 98.2 F (36.8 C)  Resp: 18    GENERAL:alert, no distress, well developed, comfortable, cooperative, obese and smiling SKIN: skin color, texture, turgor are normal, no rashes or significant lesions HEAD: Normocephalic, No masses, lesions, tenderness  or abnormalities EYES: normal, PERRLA EARS: External ears normal OROPHARYNX:no exudate, no erythema, lips, buccal mucosa, and tongue normal and mucous membranes are moist  NECK: supple, trachea midline LYMPH:  No neck adnopathy BREAST:not examined LUNGS: not examined HEART: not examined ABDOMEN:abdomen soft, non-tender and obese BACK: Back symmetric, no curvature. EXTREMITIES:less then 2 second capillary refill, no joint deformities, effusion, or inflammation, no skin discoloration, no cyanosis  NEURO: alert & oriented x 3 with fluent speech, no focal motor/sensory deficits, gait normal   LABORATORY DATA: CBC    Component Value Date/Time   WBC 6.2 10/24/2014 0839   RBC 4.57 10/24/2014 0839   HGB 13.0 10/24/2014 0839   HCT 40.9 10/24/2014 0839   PLT 191 10/24/2014 0839   MCV 89.5 10/24/2014 0839   MCH 28.4 10/24/2014 0839   MCHC 31.8 10/24/2014 0839   RDW 13.9 10/24/2014 0839   LYMPHSABS 2.7 10/24/2014  0839   MONOABS 0.5 10/24/2014 0839   EOSABS 0.2 10/24/2014 0839   BASOSABS 0.0 10/24/2014 0839      Chemistry      Component Value Date/Time   NA 135 10/05/2014 1238   K 4.4 10/05/2014 1238   CL 102 10/05/2014 1238   CO2 28 10/05/2014 1238   BUN 20 10/05/2014 1238   CREATININE 1.22* 10/05/2014 1238      Component Value Date/Time   CALCIUM 8.7* 10/05/2014 1238   ALKPHOS 123 10/05/2014 1238   AST 29 10/05/2014 1238   ALT 38 10/05/2014 1238   BILITOT 0.9 10/05/2014 1238        PENDING LABS:   RADIOGRAPHIC STUDIES:  Ct Biopsy  09/30/2014   INDICATION: History of breast cancer, now with new hypermetabolic liver lesions worrisome for metastatic disease. Please perform CT-guided biopsy for tissue diagnostic purposes  EXAM: CT-GUIDED BIOPSY OF DOMINANT INDETERMINATE MASS WITHIN ANTERIOR SEGMENT OF THE RIGHT LOBE OF THE LIVER  COMPARISON:  PET-CT - 09/18/2014; 01/24/2014  MEDICATIONS: Fentanyl 100 mcg IV; Versed 2 mg IV  ANESTHESIA/SEDATION: Sedation time  12 minutes  CONTRAST:  None  COMPLICATIONS: None immediate  PROCEDURE: Informed consent was obtained from the patient following an explanation of the procedure, risks, benefits and alternatives. A time out was performed prior to the initiation of the procedure.  The patient was positioned supine on the CT table and a limited CT was performed for procedural planning demonstrating the dominant ill-defined hypo attenuating mass within the anterior segment of the right lobe of the liver with dominant ill-defined component measuring approximately 7.5 x 6.9 cm (image 30, series 2). The procedure was planned. The operative site was prepped and draped in the usual sterile fashion. Appropriate trajectory was confirmed with a 22 gauge spinal needle after the adjacent tissues were anesthetized with 1% Lidocaine with epinephrine.  Under intermittent CT guidance, a 17 gauge coaxial needle was advanced into the peripheral aspect of the mass. Appropriate  positioning was confirmed and 5 core needle biopsy samples were obtained with an 18 gauge core needle biopsy device. The co-axial needle was removed and hemostasis was achieved with manual compression.  A limited postprocedural CT was negative for hemorrhage or additional complication. A dressing was placed. The patient tolerated the procedure well without immediate postprocedural complication.  IMPRESSION: Technically successful CT guided core needle biopsy of ill-defined dominant mass within the anterior segment of the right lobe of the liver.   Electronically Signed   By: Sandi Mariscal M.D.   On: 09/30/2014 12:37     PATHOLOGY: As previously  noted    ASSESSMENT AND PLAN:  Invasive ductal carcinoma of breast Stage IV Breast cancer with bone involvement requiring Xgeva.  Previously on Faslodex and Aromasin until imaging demonstrated a new hepatic lesion resulting in biopsy proven progression of disease.  Now on Aromasin + Afinitor.  Oncology history updated.  Afinitor dose is 10 mg daily.  New treatment began on 10/20/2014.  Based upon results from the SWISH trial, I will prescribe Dexamethasone mouthwash.  SWISH trial demonstrated a significant reduction in grade 2 or higher stomatitis with Dexamethsone mouthwash on evaluation on week 8 of therapy with a reported incidence of grade 2 or greater stomatitis of 2.4% (compared to 33% in BOLERA-2 trial).  Rx for Dexamethasone printed: 0.60m/5mL, swish for 2 minutes and spit QID x 8 weeks (or more).  Do not eat 1 hour after using.  [facility]  Given her metastatic disease, we had a discussion regarding code status.  She was very clear that she wishes to be a DO NOT RESUSCITATE.  DNR form filled out and provided to the patient.  Refill on her Oxycodone provided today.  XDelton Seetoday if labs permit.  She declines influenza vaccine.  Return in 2 weeks for follow-up and labs: CBC diff, CMET.   THERAPY  PLAN:  Continue Afinitor and Aromasin daily.  Continue Xgeva monthly.  All questions were answered. The patient knows to call the clinic with any problems, questions or concerns. We can certainly see the patient much sooner if necessary.  More than 50% of the time spent with the patient was utilized for counseling and coordination of care.  Patient and plan discussed with Dr. SAncil Linseyand she is in agreement with the aforementioned.   This note is electronically signed by: KDoy Mince10/07/2014 9:26 AM

## 2014-10-24 NOTE — Progress Notes (Signed)
LABS DRAWN

## 2014-10-24 NOTE — Progress Notes (Signed)
Kaylee Mitchell presents today for injection per MD orders. Xgeva 120 mg administered SQ in left Abdomen. Administration without incident. Patient tolerated well.

## 2014-10-24 NOTE — Assessment & Plan Note (Addendum)
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  Previously on Faslodex and Aromasin until imaging demonstrated a new hepatic lesion resulting in biopsy proven progression of disease.  Now on Aromasin + Afinitor.  Oncology history updated.  Afinitor dose is 10 mg daily.  New treatment began on 10/20/2014.  Based upon results from the SWISH trial, I will prescribe Dexamethasone mouthwash.  SWISH trial demonstrated a significant reduction in grade 2 or higher stomatitis with Dexamethsone mouthwash on evaluation on week 8 of therapy with a reported incidence of grade 2 or greater stomatitis of 2.4% (compared to 33% in BOLERA-2 trial).  Rx for Dexamethasone printed: 0.5mg /32mL, swish for 2 minutes and spit QID x 8 weeks (or more).  Do not eat 1 hour after using.  Pain contract in place with [facility].  Given her metastatic disease, we had a discussion regarding code status.  She was very clear that she wishes to be a DO NOT RESUSCITATE.  DNR form filled out and provided to the patient.  Refill on her Oxycodone provided today.  Delton See today if labs permit.  She declines influenza vaccine.  Return in 2 weeks for follow-up and labs: CBC diff, CMET.

## 2014-10-24 NOTE — Addendum Note (Signed)
Addended by: Mellissa Kohut on: 10/24/2014 10:07 AM   Modules accepted: Orders

## 2014-10-24 NOTE — Progress Notes (Signed)
Please see doctors encounter for more information 

## 2014-10-24 NOTE — Patient Instructions (Addendum)
[place] at [facility] Discharge Instructions  RECOMMENDATIONS MADE BY THE CONSULTANT AND ANY TEST RESULTS WILL BE SENT TO YOUR REFERRING PHYSICIAN.  Exam and discussion by Robynn Pane, PA-C If your calcium level is ok you will get xgeva injection today. Dexamethasone solution 10 ml 4 times daily - swish for 2 minutes and spit.  No food for 1 hour after using Refill for Oxycodone DNR form completed Report any new lumps, bone pain, shortness of breath or other symptoms. Call with any concerns.  Follow-up in 2 weeks with labs and office visit.   Thank you for choosing [place] at [facility] to provide your oncology and hematology care.  To afford each patient quality time with our provider, please arrive at least 15 minutes before your scheduled appointment time.    You need to re-schedule your appointment should you arrive 10 or more minutes late.  We strive to give you quality time with our providers, and arriving late affects you and other patients whose appointments are after yours.  Also, if you no show three or more times for appointments you may be dismissed from the clinic at the providers discretion.     Again, thank you for choosing [facility].  Our hope is that these requests will decrease the amount of time that you wait before being seen by our physicians.       _____________________________________________________________  Should you have questions after your visit to [facility], please contact our office at [phone] between the hours of 8:30 a.m. and 4:30 p.m.  Voicemails left after 4:30 p.m. will not be returned until the following business day.  For prescription refill requests, have your pharmacy contact our office.

## 2014-11-05 ENCOUNTER — Encounter (HOSPITAL_COMMUNITY): Payer: Self-pay | Admitting: Oncology

## 2014-11-05 DIAGNOSIS — Z66 Do not resuscitate: Secondary | ICD-10-CM

## 2014-11-05 HISTORY — DX: Do not resuscitate: Z66

## 2014-11-05 NOTE — Assessment & Plan Note (Addendum)
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  Previously on Faslodex and Aromasin until imaging demonstrated a new hepatic lesion resulting in biopsy proven progression of disease.  Now on Aromasin + Afinitor.  Oncology history is up-to-date.  Afinitor dose is 10 mg daily.  New treatment began on 10/20/2014.  Based upon results from the SWISH trial, I will prescribe Dexamethasone mouthwash.  SWISH trial demonstrated a significant reduction in grade 2 or higher stomatitis with Dexamethsone mouthwash on evaluation on week 8 of therapy with a reported incidence of grade 2 or greater stomatitis of 2.4% (compared to 33% in BOLERA-2 trial).  Rx for Dexamethasone provided on 10/24/2014: 0.5mg /61mL, swish for 2 minutes and spit QID x 8 weeks (or more).  Do not eat 1 hour after using.   Pain contract in place with [facility].  DO NOT RESUSCITATE.  DNR form filled out and provided to the patient on 10/24/2014.  Xgeva due in 2 weeks.  She declines influenza vaccine.  IV fluid, NS, at 500cc/hr today for an increase in BUN/Creatinine.  She reports that she drinking about 60-80 oz of H2O daily.  She reports symptoms of orthostatic hypotension and dizziness.  Continue Dexamethasone Swish and Swallow.  Labs in 1 weeks: CBC diff, CMET, CA 27.29, and CA15-3.  If her weight continues to decline, I will request nutrition consult with Burtis Junes.  Return in 1 week for follow-up.  Addendum: Hyperglycemia noted.  Afinitor may exacerbate.  Will give 8 units of insulin today in the clinic.  We will add a sliding scale insulin to her DM treatment.  She has humalog at home and therefore, I will give her the sliding scale to follow at home.  She will check her glucose in the afternoon and administer insulin according to the sliding scale.  Continue Lantus as prescribed and defer any changes to managing physician.  151-200: 2 units 201-250: 4 units 251- 300: 6 units Greater than  300: 8 units and call MD.

## 2014-11-05 NOTE — Progress Notes (Addendum)
[name], MD [address] [place] [address]  Invasive ductal carcinoma of breast, unspecified laterality ([place]) - Plan: CBC with Differential, Comprehensive metabolic panel, Cancer antigen 27.29, Cancer antigen 15-3  DNR (do not resuscitate)  Decreased renal function - Plan: DISCONTINUED: 0.9 %  sodium chloride infusion  Hyperglycemia - Plan: insulin aspart (novoLOG) injection 8 Units  CURRENT THERAPY: Afinitor + Exemestane.  Xgeva monthly.  INTERVAL HISTORY: Kaylee Mitchell 59 y.o. female returns for followup of Stage IV breast cancer, ER/PR+, HER2 neg, with new hepatic metastasis (biopsy proven).    Invasive ductal carcinoma of breast ([place])   05/26/2009 Initial Diagnosis Needle core biopsy showed invasive mammary carcinoma with 1 + lymph node   06/03/2009 Surgery Right modified radical mastectomy with 8/8 + lymph nodes by Dr. Ronnell [place]   07/22/2009 Cancer Staging Bone scan + for multiple osseous lesions.  No disease elsewhere.   07/24/2009 - 03/23/2010 Chemotherapy Arimidex   03/23/2010 Progression    03/23/2010 - 12/05/2012 Chemotherapy Tamoxifen   11/28/2012 Progression Worsening of lumbar spine osseous disease   12/06/2012 - 09/25/2014 Chemotherapy Fulvestrant + Exemestane   01/24/2014 PET scan No evidence of hypermetabolic recurrent or metastatic disease. Widespread osseous metastasis, without significant marrow hypermetabolism.   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   10/20/2014 -  Chemotherapy Afinitor + Exemestane   I personally reviewed and went over laboratory results with the patient.  The results are noted within this dictation.  Her renal function has changed/worsened.  She notes that she is consuming 60-80 oz of H2O daily.  I have asked her to continue and try to increase that amount.  She reports orthostatic hypotension symptoms with postural dizziness resolving in a  short time.   Clinically, she does not look well today.  I hope this is dehydration related as we will replenish her fluids via NS IV today.  She reports that she is "swimmy-headed."  She denies any mouth sores, nausea/vomiting, cough, nasal discharge, sputum production.  She admits to a decreased appetite, but is currently hungry.  She notes that foods do not taste well to her.  Her weight is down 6-7 lbs compared to 2 weeks ago.  I provided the patient education regarding Afinitor-induced stomatitis.  I provided her education regarding preventative intervention with good mouth care containing non-alcohol products.  I provided her information regarding the SWISH trial as well.  Details follow below in A/P.  She is educated that most instances of stomatitis occurs within the first 8 weeks.  She is tolerating therapy well thus far.  Past Medical History  Diagnosis Date  . Hypertension   . High cholesterol   . Knee pain, bilateral 2013  . Cancer ([place])     rt breast is primary  . Bone metastasis ([place]) 09/13/2010  . Diabetes mellitus     type II  . Depression   . Depression 12/14/2011  . Invasive ductal carcinoma of breast ([facility]) 09/13/2010  . Opioid contract exists with [facility] 10/24/2014    Contract found in Chart review in letter tab.  . 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     has No Known Allergies.  Current Outpatient Prescriptions on File Prior to Visit  Medication Sig  Dispense Refill  . aspirin EC 81 MG tablet Take 81 mg by mouth daily.      Marland Kitchen atorvastatin (LIPITOR) 20 MG tablet TAKE ONE TABLET BY MOUTH ONCE DAILY 30 tablet 0  . calcium-vitamin D (OSCAL WITH D) 500-200 MG-UNIT per tablet Take 1 tablet by mouth 2 (two) times daily.     . carvedilol (COREG) 12.5 MG tablet TAKE ONE TABLET BY MOUTH TWICE DAILY WITH MEALS 180 tablet 0  . cyclobenzaprine (FLEXERIL) 10 MG tablet Take 1 tablet (10 mg total) by mouth 3 (three) times daily as needed for muscle spasms. 10 tablet 0  . dexamethasone (DECADRON) 0.5 MG/5ML solution Take 10 mLs (1 mg total) by mouth 4 (four) times daily. 560 mL 4  . diphenhydrAMINE (BENADRYL) 25 MG tablet Take 25 mg by mouth at bedtime as needed.    . docusate sodium (STOOL SOFTENER) 100 MG capsule Take 100 mg by mouth daily as needed for mild constipation.     Marland Kitchen everolimus (AFINITOR) 10 MG tablet Take 1 tablet (10 mg total) by mouth daily. 30 tablet 2  . exemestane (AROMASIN) 25 MG tablet Take 1 tablet (25 mg total) by mouth daily after breakfast. 90 tablet 3  . gabapentin (NEURONTIN) 300 MG capsule Take 300 mg by mouth 3 (three) times daily.  3  . Insulin Glargine (LANTUS) 100 UNIT/ML Solostar Pen Inject 60 Units into the skin at bedtime.    Marland Kitchen LORazepam (ATIVAN PO) Take 1 tablet by mouth daily as needed (anxiety).     Marland Kitchen losartan-hydrochlorothiazide (HYZAAR) 50-12.5 MG per tablet Take 1 tablet by mouth daily.    . metFORMIN (GLUCOPHAGE) 500 MG tablet Take 500 mg by mouth 2 (two) times daily with a meal.    . ondansetron (ZOFRAN) 8 MG tablet Take 1 tablet (8 mg total) by mouth every 8 (eight) hours as needed for nausea or vomiting. 30 tablet 3  . Oxycodone HCl 10 MG TABS Take 1 tablet every 4 hours as needed for pain. 100 tablet 0  . Polyethylene Glycol 3350 (MIRALAX PO) Take 17 g by mouth daily as needed (constipation).     . potassium chloride SA (K-DUR,KLOR-CON) 20 MEQ tablet Take 1 tablet (20 mEq total) by mouth daily. 90 tablet 4  .  sertraline (ZOLOFT) 100 MG tablet Take 100 mg by mouth daily.    Marland Kitchen venlafaxine XR (EFFEXOR XR) 150 MG 24 hr capsule Take take one capsule at bedtime nightly to control hot flashes. (Patient taking differently: Take 150 mg by mouth daily as needed. Take take one capsule at bedtime nightly to control hot flashes.) 60 capsule 6  . VENTOLIN HFA 108 (90 BASE) MCG/ACT inhaler Inhale 2 puffs into the lungs 4 (four) times daily as needed for wheezing or shortness of breath.   0  . omeprazole (PRILOSEC) 40 MG capsule TAKE 1 CAPSULE (40 MG TOTAL) BY MOUTH DAILY. (Patient not taking: Reported on 11/06/2014) 30 capsule 3  . [DISCONTINUED] loratadine (CLARITIN) 10 MG tablet Take 10 mg by mouth daily as needed. For allergies     Current Facility-Administered Medications on File Prior to Visit  Medication Dose Route Frequency Provider Last Rate Last Dose  . 1 mL EPINEPHrine 1 mg/mL (1:1000) in  0.9% Normal Saline 3000 mL irrigation    PRN Carole Civil, MD        Past Surgical History  Procedure Laterality Date  . Knee surgery      right-arthroscopy  . Mastectomy      right side  . Abdominal hysterectomy      Denies any headaches, double vision, fevers, chills, night sweats, vomiting, diarrhea, constipation, chest pain, heart palpitations, shortness of breath, blood in stool, black tarry stool, urinary pain, urinary burning, urinary frequency, hematuria.   PHYSICAL EXAMINATION  ECOG PERFORMANCE STATUS: 1 - Symptomatic but completely ambulatory  Filed Vitals:   11/06/14 1011  BP: 125/93  Pulse: 106  Temp: 97.9 F (36.6 C)  Resp: 18    GENERAL:alert, no distress, well developed, comfortable, cooperative, obese and looking ill today.  She is non-toxic appearing, afebrile, and asymptomatic of acute issue. SKIN: skin color, texture, turgor are normal, no rashes or significant lesions HEAD: Normocephalic, No masses, lesions, tenderness or abnormalities EYES: normal, PERRLA EARS: External ears  normal OROPHARYNX:no exudate, no erythema, lips, buccal mucosa, and tongue normal and mucous membranes are moist.  No sores noted on exam. NECK: supple, trachea midline LYMPH:  No neck adnopathy BREAST:not examined LUNGS: CTA B/L HEART: RRR without murmur ABDOMEN:abdomen soft, non-tender and obese BACK: Back symmetric, no curvature. EXTREMITIES:less then 2 second capillary refill, no joint deformities, effusion, or inflammation, no skin discoloration, no cyanosis  NEURO: alert & oriented x 3 with fluent speech, no focal motor/sensory deficits, gait normal   LABORATORY DATA: CBC    Component Value Date/Time   WBC 5.4 11/06/2014 0909   RBC 4.74 11/06/2014 0909   HGB 13.5 11/06/2014 0909   HCT 41.3 11/06/2014 0909   PLT 157 11/06/2014 0909   MCV 87.1 11/06/2014 0909   MCH 28.5 11/06/2014 0909   MCHC 32.7 11/06/2014 0909   RDW 12.8 11/06/2014 0909   LYMPHSABS 2.6 11/06/2014 0909   MONOABS 0.5 11/06/2014 0909   EOSABS 0.2 11/06/2014 0909   BASOSABS 0.0 11/06/2014 0909      Chemistry      Component Value Date/Time   NA 137 11/06/2014 0909   K 4.7 11/06/2014 0909   CL 101 11/06/2014 0909   CO2 28 11/06/2014 0909   BUN 29* 11/06/2014 0909   CREATININE 1.58* 11/06/2014 0909      Component Value Date/Time   CALCIUM 9.5 11/06/2014 0909   ALKPHOS 123 11/06/2014 0909   AST 36 11/06/2014 0909   ALT 26 11/06/2014 0909   BILITOT 0.6 11/06/2014 0909        PENDING LABS:   RADIOGRAPHIC STUDIES:  No results found.   PATHOLOGY: As previously noted    ASSESSMENT AND PLAN:  Invasive ductal carcinoma of breast Stage IV Breast cancer with bone involvement requiring Xgeva.  Previously on Faslodex and Aromasin until imaging demonstrated a new hepatic lesion resulting in biopsy proven progression of disease.  Now on Aromasin + Afinitor.  Oncology history is up-to-date.  Afinitor dose is 10 mg daily.  New treatment began on 10/20/2014.  Based upon results from the SWISH  trial, I will prescribe Dexamethasone mouthwash.  SWISH trial demonstrated a significant reduction in grade 2 or higher stomatitis with Dexamethsone mouthwash on evaluation on week 8 of therapy with a reported incidence of grade 2 or greater stomatitis of 2.4% (compared to 33% in BOLERA-2 trial).  Rx for Dexamethasone provided on 10/24/2014: 0.77m/5mL, swish for 2 minutes and spit QID x 8 weeks (or  more).  Do not eat 1 hour after using.   Pain contract in place with [facility].  DO NOT RESUSCITATE.  DNR form filled out and provided to the patient on 10/24/2014.  Xgeva due in 2 weeks.  She declines influenza vaccine.  IV fluid, NS, at 500cc/hr today for an increase in BUN/Creatinine.  She reports that she drinking about 60-80 oz of H2O daily.  She reports symptoms of orthostatic hypotension and dizziness.  Continue Dexamethasone Swish and Swallow.  Labs in 1 weeks: CBC diff, CMET, CA 27.29, and CA15-3.  If her weight continues to decline, I will request nutrition consult with Burtis Junes.  Return in 1 week for follow-up.  Addendum: Hyperglycemia noted.  Afinitor may exacerbate.  Will give 8 units of insulin today in the clinic.  We will add a sliding scale insulin to her DM treatment.  She has humalog at home and therefore, I will give her the sliding scale to follow at home.  She will check her glucose in the afternoon and administer insulin according to the sliding scale.  Continue Lantus as prescribed and defer any changes to managing physician.  151-200: 2 units 201-250: 4 units 251- 300: 6 units Greater than 300: 8 units and call MD.   THERAPY PLAN:  Continue Afinitor and Aromasin daily.  Continue Xgeva monthly.  Will be mindful of Afinitor side effects including, but not limited to, stomatitis and pneumonitis.    All questions were answered. The patient knows to call the clinic with any problems, questions or concerns. We can certainly see the patient much sooner  if necessary.  Patient and plan discussed with Dr. Ancil Linsey and she is in agreement with the aforementioned.   This note is electronically signed by: Doy Mince 11/06/2014 12:05 PM

## 2014-11-06 ENCOUNTER — Encounter (HOSPITAL_BASED_OUTPATIENT_CLINIC_OR_DEPARTMENT_OTHER): Payer: Medicare Other

## 2014-11-06 ENCOUNTER — Encounter (HOSPITAL_BASED_OUTPATIENT_CLINIC_OR_DEPARTMENT_OTHER): Payer: Medicare Other | Admitting: Oncology

## 2014-11-06 ENCOUNTER — Encounter (HOSPITAL_COMMUNITY): Payer: Self-pay | Admitting: Oncology

## 2014-11-06 VITALS — BP 150/90 | HR 106 | Temp 97.7°F | Resp 16

## 2014-11-06 VITALS — BP 125/93 | HR 106 | Temp 97.9°F | Resp 18 | Wt 288.3 lb

## 2014-11-06 DIAGNOSIS — N289 Disorder of kidney and ureter, unspecified: Secondary | ICD-10-CM

## 2014-11-06 DIAGNOSIS — E860 Dehydration: Secondary | ICD-10-CM | POA: Diagnosis present

## 2014-11-06 DIAGNOSIS — R739 Hyperglycemia, unspecified: Secondary | ICD-10-CM

## 2014-11-06 DIAGNOSIS — E119 Type 2 diabetes mellitus without complications: Secondary | ICD-10-CM

## 2014-11-06 DIAGNOSIS — C7951 Secondary malignant neoplasm of bone: Secondary | ICD-10-CM

## 2014-11-06 DIAGNOSIS — Z170 Estrogen receptor positive status [ER+]: Secondary | ICD-10-CM | POA: Diagnosis not present

## 2014-11-06 DIAGNOSIS — I951 Orthostatic hypotension: Secondary | ICD-10-CM

## 2014-11-06 DIAGNOSIS — Z66 Do not resuscitate: Secondary | ICD-10-CM

## 2014-11-06 DIAGNOSIS — C50919 Malignant neoplasm of unspecified site of unspecified female breast: Secondary | ICD-10-CM

## 2014-11-06 DIAGNOSIS — C50911 Malignant neoplasm of unspecified site of right female breast: Secondary | ICD-10-CM

## 2014-11-06 LAB — CBC WITH DIFFERENTIAL/PLATELET
Basophils Absolute: 0 10*3/uL (ref 0.0–0.1)
Basophils Relative: 0 %
EOS ABS: 0.2 10*3/uL (ref 0.0–0.7)
Eosinophils Relative: 3 %
HEMATOCRIT: 41.3 % (ref 36.0–46.0)
HEMOGLOBIN: 13.5 g/dL (ref 12.0–15.0)
LYMPHS ABS: 2.6 10*3/uL (ref 0.7–4.0)
Lymphocytes Relative: 47 %
MCH: 28.5 pg (ref 26.0–34.0)
MCHC: 32.7 g/dL (ref 30.0–36.0)
MCV: 87.1 fL (ref 78.0–100.0)
MONOS PCT: 10 %
Monocytes Absolute: 0.5 10*3/uL (ref 0.1–1.0)
NEUTROS ABS: 2.2 10*3/uL (ref 1.7–7.7)
NEUTROS PCT: 40 %
Platelets: 157 10*3/uL (ref 150–400)
RBC: 4.74 MIL/uL (ref 3.87–5.11)
RDW: 12.8 % (ref 11.5–15.5)
WBC: 5.4 10*3/uL (ref 4.0–10.5)

## 2014-11-06 LAB — COMPREHENSIVE METABOLIC PANEL
ALK PHOS: 123 U/L (ref 38–126)
ALT: 26 U/L (ref 14–54)
ANION GAP: 8 (ref 5–15)
AST: 36 U/L (ref 15–41)
Albumin: 3.5 g/dL (ref 3.5–5.0)
BILIRUBIN TOTAL: 0.6 mg/dL (ref 0.3–1.2)
BUN: 29 mg/dL — ABNORMAL HIGH (ref 6–20)
CALCIUM: 9.5 mg/dL (ref 8.9–10.3)
CO2: 28 mmol/L (ref 22–32)
Chloride: 101 mmol/L (ref 101–111)
Creatinine, Ser: 1.58 mg/dL — ABNORMAL HIGH (ref 0.44–1.00)
GFR, EST AFRICAN AMERICAN: 40 mL/min — AB (ref >60)
GFR, EST NON AFRICAN AMERICAN: 35 mL/min — AB (ref >60)
Glucose, Bld: 342 mg/dL — ABNORMAL HIGH (ref 65–99)
Potassium: 4.7 mmol/L (ref 3.5–5.1)
Sodium: 137 mmol/L (ref 135–145)
TOTAL PROTEIN: 7.6 g/dL (ref 6.5–8.1)

## 2014-11-06 MED ORDER — SODIUM CHLORIDE 0.9 % IV SOLN
INTRAVENOUS | Status: DC
  Administered 2014-11-06: 11:00:00 via INTRAVENOUS

## 2014-11-06 MED ORDER — INSULIN ASPART 100 UNIT/ML ~~LOC~~ SOLN
8.0000 [IU] | Freq: Once | SUBCUTANEOUS | Status: AC
Start: 2014-11-06 — End: 2014-11-06
  Administered 2014-11-06: 8 [IU] via SUBCUTANEOUS
  Filled 2014-11-06: qty 0.08

## 2014-11-06 NOTE — Progress Notes (Signed)
LABS DRAWN

## 2014-11-06 NOTE — Patient Instructions (Signed)
[place] at [facility] Discharge Instructions  RECOMMENDATIONS MADE BY THE CONSULTANT AND ANY TEST RESULTS WILL BE SENT TO YOUR REFERRING PHYSICIAN.  IV fluids today Insulin given today Sliding scale insulin given  Return as scheduled Please call the clinic if you have any questions or concerns  Thank you for choosing [place] at [facility] to provide your oncology and hematology care.  To afford each patient quality time with our provider, please arrive at least 15 minutes before your scheduled appointment time.    You need to re-schedule your appointment should you arrive 10 or more minutes late.  We strive to give you quality time with our providers, and arriving late affects you and other patients whose appointments are after yours.  Also, if you no show three or more times for appointments you may be dismissed from the clinic at the providers discretion.     Again, thank you for choosing [facility].  Our hope is that these requests will decrease the amount of time that you wait before being seen by our physicians.       _____________________________________________________________  Should you have questions after your visit to [facility], please contact our office at [phone] between the hours of 8:30 a.m. and 4:30 p.m.  Voicemails left after 4:30 p.m. will not be returned until the following business day.  For prescription refill requests, have your pharmacy contact our office.

## 2014-11-06 NOTE — Patient Instructions (Signed)
[place] at [facility] Discharge Instructions  RECOMMENDATIONS MADE BY THE CONSULTANT AND ANY TEST RESULTS WILL BE SENT TO YOUR REFERRING PHYSICIAN.  Exam and discussion today with Kirby Crigler, PA. IV fluids today. Please increase your water intake at home. Return as scheduled in on 11/12/14 for lab work and office visit.  Thank you for choosing [place] at [facility] to provide your oncology and hematology care.  To afford each patient quality time with our provider, please arrive at least 15 minutes before your scheduled appointment time.    You need to re-schedule your appointment should you arrive 10 or more minutes late.  We strive to give you quality time with our providers, and arriving late affects you and other patients whose appointments are after yours.  Also, if you no show three or more times for appointments you may be dismissed from the clinic at the providers discretion.     Again, thank you for choosing [facility].  Our hope is that these requests will decrease the amount of time that you wait before being seen by our physicians.       _____________________________________________________________  Should you have questions after your visit to [facility], please contact our office at [phone] between the hours of 8:30 a.m. and 4:30 p.m.  Voicemails left after 4:30 p.m. will not be returned until the following business day.  For prescription refill requests, have your pharmacy contact our office.

## 2014-11-06 NOTE — Addendum Note (Signed)
Addended by: Baird Cancer on: 11/06/2014 12:06 PM   Modules accepted: Orders, Level of Service

## 2014-11-06 NOTE — Progress Notes (Signed)
Kaylee Mitchell Tolerated iv fluids today Discharged ambulatory

## 2014-11-11 NOTE — Progress Notes (Signed)
Rescheduled

## 2014-11-11 NOTE — Assessment & Plan Note (Deleted)
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  Previously on Faslodex and Aromasin until imaging demonstrated a new hepatic lesion resulting in biopsy proven progression of disease.  Now on Aromasin + Afinitor.  Oncology history is up-to-date.  Afinitor dose is 10 mg daily.  New treatment began on 10/20/2014.  Based upon results from the SWISH trial, I will prescribe Dexamethasone mouthwash.  SWISH trial demonstrated a significant reduction in grade 2 or higher stomatitis with Dexamethsone mouthwash on evaluation on week 8 of therapy with a reported incidence of grade 2 or greater stomatitis of 2.4% (compared to 33% in BOLERA-2 trial).  Rx for Dexamethasone provided on 10/24/2014: 0.5mg /50mL, swish for 2 minutes and spit QID x 8 weeks (or more).  Do not eat 1 hour after using.   Pain contract in place with [facility].  DO NOT RESUSCITATE.  DNR form filled out and provided to the patient on 10/24/2014.  Xgeva due in 1 week.  She declines influenza vaccine.  Continue Dexamethasone Swish and Swallow.  Labs today: CBC diff, CMET, CA 27.29, and CA15-3.  If her weight continues to decline, I will request nutrition consult with Burtis Junes.  Return in 1 week for follow-up.  Hyperglycemia noted.  Afinitor may exacerbate.  Will give 8 units of insulin today in the clinic.  We will add a sliding scale insulin to her DM treatment.  She has humalog at home and therefore, I will give her the sliding scale to follow at home.  She will check her glucose in the afternoon and administer insulin according to the sliding scale.  Continue Lantus as prescribed and defer any changes to managing physician.  151-200: 2 units 201-250: 4 units 251- 300: 6 units Greater than 300: 8 units and call MD.

## 2014-11-12 ENCOUNTER — Ambulatory Visit (HOSPITAL_COMMUNITY): Payer: Medicare Other | Admitting: Oncology

## 2014-11-12 ENCOUNTER — Other Ambulatory Visit (HOSPITAL_COMMUNITY): Payer: Medicare Other

## 2014-11-12 NOTE — Progress Notes (Signed)
[name], MD [place] [address]  Invasive ductal carcinoma of breast, unspecified laterality ([place]) - Plan: everolimus (AFINITOR) 7.5 MG tablet  Hyperglycemia - Plan: insulin aspart (novoLOG) injection 8 Units  CURRENT THERAPY: Afinitor + Exemestane.  Xgeva monthly.  INTERVAL HISTORY: Kaylee Mitchell 59 y.o. female returns for followup of Stage IV breast cancer, ER/PR+, HER2 neg, with new hepatic metastasis (biopsy proven).    Invasive ductal carcinoma of breast ([facility])   05/26/2009 Initial Diagnosis Needle core biopsy showed invasive mammary carcinoma with 1 + lymph node   06/03/2009 Surgery Right modified radical mastectomy with 8/8 + lymph nodes by Dr. Ronnell Freshwater   07/22/2009 Cancer Staging Bone scan + for multiple osseous lesions.  No disease elsewhere.   07/24/2009 - 03/23/2010 Chemotherapy Arimidex   03/23/2010 Progression    03/23/2010 - 12/05/2012 Chemotherapy Tamoxifen   11/28/2012 Progression Worsening of lumbar spine osseous disease   12/06/2012 - 09/25/2014 Chemotherapy Fulvestrant + Exemestane   01/24/2014 PET scan No evidence of hypermetabolic recurrent or metastatic disease. Widespread osseous metastasis, without significant marrow hypermetabolism.   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   10/20/2014 -  Chemotherapy Afinitor + Exemestane   I personally reviewed and went over laboratory results with the patient.  The results are noted within this dictation.    I provided the patient education regarding Afinitor-induced stomatitis.  I provided her education regarding preventative intervention with good mouth care containing non-alcohol products.  I provided her information regarding the SWISH trial as well.  Details follow below in A/P.  She is educated that most instances of stomatitis occurs within the first 8 weeks.  "I don't feel good."  She notes significant  fatigue.  She reports a minimal sore throat without any lesions.  She is continuing Dexamethasone mouthwash QID.  She is encouraged to continue.  She can use OTC medication for sore throat which she is doing.  She looks run down and tired.  She admits that she is drinking plenty of H2O.  She is drinking > 60 oz daily.  She reports that she is taking her long-acting insulin at home along with her short-acting when needed per sliding scale directions.  She reports that her glucose has been running in the 100's in the AM, but in the low 200's in the PM. She admits that she is following the directions on the sliding scale.  Past Medical History  Diagnosis Date  . Hypertension   . High cholesterol   . Knee pain, bilateral 2013  . Cancer ([place])     rt breast is primary  . Bone metastasis ([place]) 09/13/2010  . Diabetes mellitus     type II  . Depression   . Depression 12/14/2011  . Invasive ductal carcinoma of breast ([place]) 09/13/2010  . Opioid contract exists with [facility] 10/24/2014    Contract found in Chart review in letter tab.  . 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  has No Known Allergies.  Current Outpatient Prescriptions on File Prior to Visit  Medication Sig Dispense Refill  . aspirin EC 81 MG tablet Take 81 mg by mouth daily.      Marland Kitchen atorvastatin (LIPITOR) 20 MG tablet TAKE ONE TABLET BY MOUTH ONCE DAILY 30 tablet 0  . calcium-vitamin D (OSCAL WITH D) 500-200 MG-UNIT  per tablet Take 1 tablet by mouth 2 (two) times daily.     . carvedilol (COREG) 12.5 MG tablet TAKE ONE TABLET BY MOUTH TWICE DAILY WITH MEALS 180 tablet 0  . cyclobenzaprine (FLEXERIL) 10 MG tablet Take 1 tablet (10 mg total) by mouth 3 (three) times daily as needed for muscle spasms. 10 tablet 0  . dexamethasone (DECADRON) 0.5 MG/5ML solution Take 10 mLs (1 mg total) by mouth 4 (four) times daily. 560 mL 4  . diphenhydrAMINE (BENADRYL) 25 MG tablet Take 25 mg by mouth at bedtime as needed.    . docusate sodium (STOOL SOFTENER) 100 MG capsule Take 100 mg by mouth daily as needed for mild constipation.     Marland Kitchen exemestane (AROMASIN) 25 MG tablet Take 1 tablet (25 mg total) by mouth daily after breakfast. 90 tablet 3  . gabapentin (NEURONTIN) 300 MG capsule Take 300 mg by mouth 3 (three) times daily.  3  . Insulin Glargine (LANTUS) 100 UNIT/ML Solostar Pen Inject 60 Units into the skin at bedtime.    Marland Kitchen LORazepam (ATIVAN PO) Take 1 tablet by mouth daily as needed (anxiety).     Marland Kitchen losartan-hydrochlorothiazide (HYZAAR) 50-12.5 MG per tablet Take 1 tablet by mouth daily.    . metFORMIN (GLUCOPHAGE) 500 MG tablet Take 500 mg by mouth 2 (two) times daily with a meal.    . omeprazole (PRILOSEC) 40 MG capsule TAKE 1 CAPSULE (40 MG TOTAL) BY MOUTH DAILY. 30 capsule 3  . ondansetron (ZOFRAN) 8 MG tablet Take 1 tablet (8 mg total) by mouth every 8 (eight) hours as needed for nausea or vomiting. 30 tablet 3  . Oxycodone HCl 10 MG TABS Take 1 tablet every 4 hours as needed for pain. 100 tablet 0  . Polyethylene Glycol 3350 (MIRALAX PO) Take 17 g by mouth daily as needed (constipation).     . potassium chloride SA (K-DUR,KLOR-CON) 20 MEQ tablet Take 1 tablet (20 mEq total) by mouth daily. 90 tablet 4  . sertraline (ZOLOFT) 100 MG tablet Take 100 mg by mouth daily.    Marland Kitchen venlafaxine XR (EFFEXOR XR) 150 MG 24 hr capsule Take take one capsule at bedtime nightly to control hot flashes. (Patient taking differently: Take  150 mg by mouth daily as needed. Take take one capsule at bedtime nightly to control hot flashes.) 60 capsule 6  . VENTOLIN HFA 108 (90 BASE) MCG/ACT inhaler Inhale 2 puffs into the lungs 4 (four) times daily as needed for wheezing or shortness of breath.   0  . [DISCONTINUED] loratadine (CLARITIN) 10 MG tablet Take 10 mg by mouth daily as needed. For allergies     Current Facility-Administered Medications on File Prior to Visit  Medication Dose Route Frequency Provider Last Rate Last Dose  . 1 mL EPINEPHrine 1 mg/mL (1:1000) in 0.9% Normal Saline 3000 mL irrigation    PRN Carole Civil, MD        Past Surgical History  Procedure Laterality Date  . Knee surgery      right-arthroscopy  . Mastectomy      right side  . Abdominal hysterectomy  Denies any headaches, double vision, fevers, chills, night sweats, vomiting, diarrhea, constipation, chest pain, heart palpitations, shortness of breath, blood in stool, black tarry stool, urinary pain, urinary burning, urinary frequency, hematuria.   PHYSICAL EXAMINATION  ECOG PERFORMANCE STATUS: 1 - Symptomatic but completely ambulatory  Filed Vitals:   11/13/14 0954  BP: 150/106  Pulse: 97  Temp: 98.6 F (37 C)  Resp: 16    GENERAL:alert, no distress, well developed, comfortable, cooperative, obese and looking ill today and tired today.  She is non-toxic appearing, afebrile, and asymptomatic of acute issue. SKIN: skin color, texture, turgor are normal, no rashes or significant lesions HEAD: Normocephalic, No masses, lesions, tenderness or abnormalities EYES: normal, PERRLA EARS: External ears normal OROPHARYNX:no exudate, no erythema, lips, buccal mucosa, and tongue normal and mucous membranes are moist.  No sores noted on exam. NECK: supple, trachea midline LYMPH:  No neck adnopathy BREAST:not examined LUNGS: CTA B/L HEART: RRR without murmur ABDOMEN:abdomen soft, non-tender and obese BACK: Back symmetric, no  curvature. EXTREMITIES:less then 2 second capillary refill, no joint deformities, effusion, or inflammation, no skin discoloration, no cyanosis  NEURO: alert & oriented x 3 with fluent speech, no focal motor/sensory deficits, gait normal   LABORATORY DATA: CBC    Component Value Date/Time   WBC 5.5 11/13/2014 0913   RBC 4.81 11/13/2014 0913   HGB 13.7 11/13/2014 0913   HCT 41.5 11/13/2014 0913   PLT 199 11/13/2014 0913   MCV 86.3 11/13/2014 0913   MCH 28.5 11/13/2014 0913   MCHC 33.0 11/13/2014 0913   RDW 12.7 11/13/2014 0913   LYMPHSABS 2.8 11/13/2014 0913   MONOABS 0.6 11/13/2014 0913   EOSABS 0.2 11/13/2014 0913   BASOSABS 0.0 11/13/2014 0913      Chemistry      Component Value Date/Time   NA 134* 11/13/2014 0913   K 3.9 11/13/2014 0913   CL 98* 11/13/2014 0913   CO2 27 11/13/2014 0913   BUN 25* 11/13/2014 0913   CREATININE 1.62* 11/13/2014 0913      Component Value Date/Time   CALCIUM 8.9 11/13/2014 0913   ALKPHOS 109 11/13/2014 0913   AST 45* 11/13/2014 0913   ALT 45 11/13/2014 0913   BILITOT 0.7 11/13/2014 0913        PENDING LABS:   RADIOGRAPHIC STUDIES:  No results found.   PATHOLOGY: As previously noted    ASSESSMENT AND PLAN:  Invasive ductal carcinoma of breast Stage IV Breast cancer with bone involvement requiring Xgeva.  Previously on Faslodex and Aromasin until imaging demonstrated a new hepatic lesion resulting in biopsy proven progression of disease.  Now on Aromasin + Afinitor.  Oncology history is updated.  Afinitor dose is 10 mg daily.  New treatment began on 10/20/2014.  Based on her symptoms today, I will hold Afintor x ~ 10 days.  She will return on 11/24/2014 for re-evaluation.  I have preemptively written a new Rx for Afinitor at a lower dose, 7.5 mg PO daily.  If patient is better on 11/7, she could start Afinitor at reduced dose on 11/8.  She is to continue with daily Exemestane.   Based upon results from the SWISH trial, I will  prescribe Dexamethasone mouthwash.  SWISH trial demonstrated a significant reduction in grade 2 or higher stomatitis with Dexamethsone mouthwash on evaluation on week 8 of therapy with a reported incidence of grade 2 or greater stomatitis of 2.4% (compared to 33% in BOLERA-2 trial).  Rx for Dexamethasone provided on 10/24/2014: 0.7m/5mL,  swish for 2 minutes and spit QID x 8 weeks (or more).  Do not eat 1 hour after using.   She is continuing with this intervention.  Pain contract in place with [facility].  DO NOT RESUSCITATE.  DNR form filled out and provided to the patient on 10/24/2014.  Delton See will be rescheduled to 11/7 at time of return appointment.  She declines influenza vaccine.  Continue Dexamethasone Swish and Swallow.  Labs today as ordered.  If her weight continues to decline, I will request nutrition consult with Burtis Junes.  Hyperglycemia noted today.  Will administer 8 units of insulin.  She refuses IV fluids today.  She notes that she is drinking lots of H2O at home, > 60 oz daily.  She is encouraged to continue with this.  She notes that her glucose has been in the 100's in the AM and in the mid-200's in the PM.  She reports compliance with continued long-acting insulin and short-acting insulin according to the sliding scale.  She is to continue with this.  She is encouraged to drink plenty of H2O today at home.  Return in 10  Days for follow-up.  Sliding Scale: 151-200: 2 units 201-250: 4 units 251- 300: 6 units Greater than 300: 8 units and call MD.     THERAPY PLAN:  Hold Afinitor x 10 days.  Continue Exemestane daily. Will be mindful of Afinitor side effects including, but not limited to, stomatitis and pneumonitis.    All questions were answered. The patient knows to call the clinic with any problems, questions or concerns. We can certainly see the patient much sooner if necessary.  Patient and plan discussed with Dr. Ancil Linsey and she is  in agreement with the aforementioned.   This note is electronically signed by: Doy Mince 11/13/2014 10:51 AM

## 2014-11-12 NOTE — Assessment & Plan Note (Addendum)
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  Previously on Faslodex and Aromasin until imaging demonstrated a new hepatic lesion resulting in biopsy proven progression of disease.  Now on Aromasin + Afinitor.  Oncology history is updated.  Afinitor dose is 10 mg daily.  New treatment began on 10/20/2014.  Based on her symptoms today, I will hold Afintor x ~ 10 days.  She will return on 11/24/2014 for re-evaluation.  I have preemptively written a new Rx for Afinitor at a lower dose, 7.5 mg PO daily.  If patient is better on 11/7, she could start Afinitor at reduced dose on 11/8.  She is to continue with daily Exemestane.   Based upon results from the SWISH trial, I will prescribe Dexamethasone mouthwash.  SWISH trial demonstrated a significant reduction in grade 2 or higher stomatitis with Dexamethsone mouthwash on evaluation on week 8 of therapy with a reported incidence of grade 2 or greater stomatitis of 2.4% (compared to 33% in BOLERA-2 trial).  Rx for Dexamethasone provided on 10/24/2014: 0.5mg /3mL, swish for 2 minutes and spit QID x 8 weeks (or more).  Do not eat 1 hour after using.   She is continuing with this intervention.  Pain contract in place with [facility].  DO NOT RESUSCITATE.  DNR form filled out and provided to the patient on 10/24/2014.  Delton See will be rescheduled to 11/7 at time of return appointment.  She declines influenza vaccine.  Continue Dexamethasone Swish and Swallow.  Labs today as ordered.  If her weight continues to decline, I will request nutrition consult with Burtis Junes.  Hyperglycemia noted today.  Will administer 8 units of insulin.  She refuses IV fluids today.  She notes that she is drinking lots of H2O at home, > 60 oz daily.  She is encouraged to continue with this.  She notes that her glucose has been in the 100's in the AM and in the mid-200's in the PM.  She reports compliance with continued long-acting insulin and  short-acting insulin according to the sliding scale.  She is to continue with this.  She is encouraged to drink plenty of H2O today at home.  Return in 10  Days for follow-up.  Sliding Scale: 151-200: 2 units 201-250: 4 units 251- 300: 6 units Greater than 300: 8 units and call MD.

## 2014-11-13 ENCOUNTER — Telehealth (HOSPITAL_COMMUNITY): Payer: Self-pay | Admitting: Oncology

## 2014-11-13 ENCOUNTER — Encounter (HOSPITAL_BASED_OUTPATIENT_CLINIC_OR_DEPARTMENT_OTHER): Payer: Medicare Other

## 2014-11-13 ENCOUNTER — Encounter (HOSPITAL_COMMUNITY): Payer: Self-pay | Admitting: Oncology

## 2014-11-13 ENCOUNTER — Encounter (HOSPITAL_BASED_OUTPATIENT_CLINIC_OR_DEPARTMENT_OTHER): Payer: Medicare Other | Admitting: Oncology

## 2014-11-13 VITALS — BP 150/106 | HR 97 | Temp 98.6°F | Resp 16 | Wt 284.7 lb

## 2014-11-13 DIAGNOSIS — R739 Hyperglycemia, unspecified: Secondary | ICD-10-CM

## 2014-11-13 DIAGNOSIS — C50911 Malignant neoplasm of unspecified site of right female breast: Secondary | ICD-10-CM | POA: Diagnosis not present

## 2014-11-13 DIAGNOSIS — C787 Secondary malignant neoplasm of liver and intrahepatic bile duct: Secondary | ICD-10-CM

## 2014-11-13 DIAGNOSIS — C7951 Secondary malignant neoplasm of bone: Secondary | ICD-10-CM

## 2014-11-13 DIAGNOSIS — C50919 Malignant neoplasm of unspecified site of unspecified female breast: Secondary | ICD-10-CM

## 2014-11-13 LAB — CBC WITH DIFFERENTIAL/PLATELET
Basophils Absolute: 0 10*3/uL (ref 0.0–0.1)
Basophils Relative: 1 %
EOS PCT: 3 %
Eosinophils Absolute: 0.2 10*3/uL (ref 0.0–0.7)
HCT: 41.5 % (ref 36.0–46.0)
Hemoglobin: 13.7 g/dL (ref 12.0–15.0)
LYMPHS PCT: 50 %
Lymphs Abs: 2.8 10*3/uL (ref 0.7–4.0)
MCH: 28.5 pg (ref 26.0–34.0)
MCHC: 33 g/dL (ref 30.0–36.0)
MCV: 86.3 fL (ref 78.0–100.0)
MONO ABS: 0.6 10*3/uL (ref 0.1–1.0)
MONOS PCT: 11 %
Neutro Abs: 1.9 10*3/uL (ref 1.7–7.7)
Neutrophils Relative %: 35 %
PLATELETS: 199 10*3/uL (ref 150–400)
RBC: 4.81 MIL/uL (ref 3.87–5.11)
RDW: 12.7 % (ref 11.5–15.5)
WBC: 5.5 10*3/uL (ref 4.0–10.5)

## 2014-11-13 LAB — COMPREHENSIVE METABOLIC PANEL
ALT: 45 U/L (ref 14–54)
AST: 45 U/L — AB (ref 15–41)
Albumin: 3.7 g/dL (ref 3.5–5.0)
Alkaline Phosphatase: 109 U/L (ref 38–126)
Anion gap: 9 (ref 5–15)
BILIRUBIN TOTAL: 0.7 mg/dL (ref 0.3–1.2)
BUN: 25 mg/dL — AB (ref 6–20)
CHLORIDE: 98 mmol/L — AB (ref 101–111)
CO2: 27 mmol/L (ref 22–32)
CREATININE: 1.62 mg/dL — AB (ref 0.44–1.00)
Calcium: 8.9 mg/dL (ref 8.9–10.3)
GFR calc Af Amer: 39 mL/min — ABNORMAL LOW (ref >60)
GFR, EST NON AFRICAN AMERICAN: 33 mL/min — AB (ref >60)
GLUCOSE: 380 mg/dL — AB (ref 65–99)
Potassium: 3.9 mmol/L (ref 3.5–5.1)
Sodium: 134 mmol/L — ABNORMAL LOW (ref 135–145)
Total Protein: 7.7 g/dL (ref 6.5–8.1)

## 2014-11-13 MED ORDER — INSULIN ASPART 100 UNIT/ML ~~LOC~~ SOLN
8.0000 [IU] | Freq: Once | SUBCUTANEOUS | Status: AC
Start: 2014-11-13 — End: 2014-11-13
  Administered 2014-11-13: 8 [IU] via SUBCUTANEOUS
  Filled 2014-11-13: qty 0.08

## 2014-11-13 MED ORDER — EVEROLIMUS 7.5 MG PO TABS: 7.5000 mg | ORAL_TABLET | Freq: Every day | ORAL | Status: DC

## 2014-11-13 MED ORDER — ONDANSETRON HCL 8 MG PO TABS: 8.0000 mg | ORAL_TABLET | Freq: Three times a day (TID) | ORAL | Status: DC | PRN

## 2014-11-13 NOTE — Progress Notes (Signed)
Kaylee Mitchell presents today for injection per MD orders. Novolog 8 units (verified by Lupita Raider, RN) administered SQ in left Upper Arm. Administration without incident. Patient tolerated well.

## 2014-11-13 NOTE — Addendum Note (Signed)
Addended by: Baird Cancer on: 11/13/2014 11:14 AM   Modules accepted: Orders

## 2014-11-13 NOTE — Telephone Encounter (Signed)
[facility]  [phone] F [phone] P

## 2014-11-13 NOTE — Patient Instructions (Signed)
[place] at [facility] Discharge Instructions  RECOMMENDATIONS MADE BY THE CONSULTANT AND ANY TEST RESULTS WILL BE SENT TO YOUR REFERRING PHYSICIAN.  Exam and discussion by Robynn Pane, PA-C Will give you some insulin today. Make sure you are checking your blood sugar and taking your insulin.  Check your blood sugar this afternoon. Increase your fluid intake Hold Afinitor for now, will be reducing your dosage Continue Exemestane   Follow-up on 11/24/14.    Thank you for choosing [place] at [facility] to provide your oncology and hematology care.  To afford each patient quality time with our provider, please arrive at least 15 minutes before your scheduled appointment time.    You need to re-schedule your appointment should you arrive 10 or more minutes late.  We strive to give you quality time with our providers, and arriving late affects you and other patients whose appointments are after yours.  Also, if you no show three or more times for appointments you may be dismissed from the clinic at the providers discretion.     Again, thank you for choosing [facility].  Our hope is that these requests will decrease the amount of time that you wait before being seen by our physicians.       _____________________________________________________________  Should you have questions after your visit to [facility], please contact our office at [phone] between the hours of 8:30 a.m. and 4:30 p.m.  Voicemails left after 4:30 p.m. will not be returned until the following business day.  For prescription refill requests, have your pharmacy contact our office.

## 2014-11-14 LAB — CANCER ANTIGEN 15-3: CANCER ANTIGEN-BREAST 15-3: 45 U/mL — AB (ref <32)

## 2014-11-14 LAB — CANCER ANTIGEN 27.29: CA 27.29: 60 U/mL — AB (ref 0.0–38.6)

## 2014-11-14 NOTE — Progress Notes (Signed)
Labs drawn

## 2014-11-17 ENCOUNTER — Other Ambulatory Visit (HOSPITAL_COMMUNITY): Payer: Self-pay | Admitting: Hematology & Oncology

## 2014-11-20 ENCOUNTER — Other Ambulatory Visit (HOSPITAL_COMMUNITY): Payer: Self-pay | Admitting: Oncology

## 2014-11-20 DIAGNOSIS — C50919 Malignant neoplasm of unspecified site of unspecified female breast: Secondary | ICD-10-CM

## 2014-11-21 ENCOUNTER — Encounter (HOSPITAL_COMMUNITY): Payer: Medicare Other

## 2014-11-23 NOTE — Progress Notes (Signed)
[name], MD [address] [place] [address]  Invasive ductal carcinoma of breast, unspecified laterality ([place]) - Plan: denosumab (XGEVA) injection 120 mg, CBC with Differential, Comprehensive metabolic panel  Bone metastasis ([place]) - Plan: denosumab (XGEVA) injection 120 mg, Oxycodone HCl 10 MG TABS  CURRENT THERAPY: Afinitor + Exemestane.  Xgeva monthly.  INTERVAL HISTORY: Kaylee Mitchell 59 y.o. female returns for followup of Stage IV breast cancer, ER/PR+, HER2 neg, with new hepatic metastasis (biopsy proven).    Invasive ductal carcinoma of breast ([place])   05/26/2009 Initial Diagnosis Needle core biopsy showed invasive mammary carcinoma with 1 + lymph node   06/03/2009 Surgery Right modified radical mastectomy with 8/8 + lymph nodes by Dr. Ronnell Freshwater   07/22/2009 Cancer Staging Bone scan + for multiple osseous lesions.  No disease elsewhere.   07/24/2009 - 03/23/2010 Chemotherapy Arimidex   03/23/2010 Progression    03/23/2010 - 12/05/2012 Chemotherapy Tamoxifen   11/28/2012 Progression Worsening of lumbar spine osseous disease   12/06/2012 - 09/25/2014 Chemotherapy Fulvestrant + Exemestane   01/24/2014 PET scan No evidence of hypermetabolic recurrent or metastatic disease. Widespread osseous metastasis, without significant marrow hypermetabolism.   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   10/20/2014 - 11/13/2014 Chemotherapy Afinitor + Exemestane   11/13/2014 Adverse Reaction Increasing fatigue   11/13/2014 Treatment Plan Change Hold Afinitor, will reduce dose to 7.5 mg daily.  Continue Exemestane and Xgeva.   11/24/2014 -  Chemotherapy Afinitor 7.5 mg daily, Exemestane daily, and continuation of Xgeva.   I personally reviewed and went over laboratory results with the patient.  The results are noted within this dictation.   She notes that feels much improved with her break in therapy.   She reports continued issues with taste of foods.  She notes that she eats and then after a few bites, it tastes "poisonous."  She is unable to characterize the taste any differently.  Hyperglycemia is noted on labs today.  She will administer 12 units of insulin, short-acting, at home.  She will restart Afinitor at reduced dose today.   Past Medical History  Diagnosis Date  . Hypertension   . High cholesterol   . Knee pain, bilateral 2013  . Cancer ([place])     rt breast is primary  . Bone metastasis ([place]) 09/13/2010  . Diabetes mellitus     type II  . Depression   . Depression 12/14/2011  . Invasive ductal carcinoma of breast ([place]) 09/13/2010  . Opioid contract exists with [facility] 10/24/2014    Contract found in Chart review in letter tab.  . 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     has No Known Allergies.  Current Outpatient Prescriptions on File Prior to Visit  Medication Sig Dispense Refill  . aspirin EC 81 MG tablet Take 81 mg by mouth daily.      Marland Kitchen atorvastatin (LIPITOR) 20 MG tablet TAKE ONE TABLET BY MOUTH ONCE DAILY 30 tablet 0  .  calcium-vitamin D (OSCAL WITH D) 500-200 MG-UNIT per tablet Take 1 tablet by mouth 2 (two) times daily.     . carvedilol (COREG) 12.5 MG tablet TAKE ONE TABLET BY MOUTH TWICE DAILY WITH MEALS 180 tablet 0  . cyclobenzaprine (FLEXERIL) 10 MG tablet Take 1 tablet (10 mg total) by mouth 3 (three) times daily as needed for muscle spasms.  10 tablet 0  . dexamethasone (DECADRON) 0.5 MG/5ML solution Take 10 mLs (1 mg total) by mouth 4 (four) times daily. 560 mL 4  . diphenhydrAMINE (BENADRYL) 25 MG tablet Take 25 mg by mouth at bedtime as needed.    . docusate sodium (STOOL SOFTENER) 100 MG capsule Take 100 mg by mouth daily as needed for mild constipation.     Marland Kitchen exemestane (AROMASIN) 25 MG tablet Take 1 tablet (25 mg total) by mouth daily after breakfast. 90 tablet 3  . gabapentin (NEURONTIN) 300 MG capsule Take 300 mg by mouth 3 (three) times daily.  3  . Insulin Glargine (LANTUS) 100 UNIT/ML Solostar Pen Inject 60 Units into the skin at bedtime.    Marland Kitchen LORazepam (ATIVAN PO) Take 1 tablet by mouth daily as needed (anxiety).     Marland Kitchen losartan-hydrochlorothiazide (HYZAAR) 50-12.5 MG per tablet Take 1 tablet by mouth daily.    . metFORMIN (GLUCOPHAGE) 500 MG tablet Take 500 mg by mouth 2 (two) times daily with a meal.    . omeprazole (PRILOSEC) 40 MG capsule TAKE 1 CAPSULE (40 MG TOTAL) BY MOUTH DAILY. 30 capsule 3  . ondansetron (ZOFRAN) 8 MG tablet Take 1 tablet (8 mg total) by mouth every 8 (eight) hours as needed for nausea or vomiting. 30 tablet 3  . Polyethylene Glycol 3350 (MIRALAX PO) Take 17 g by mouth daily as needed (constipation).     . potassium chloride SA (K-DUR,KLOR-CON) 20 MEQ tablet Take 1 tablet (20 mEq total) by mouth daily. 90 tablet 4  . sertraline (ZOLOFT) 100 MG tablet Take 100 mg by mouth daily.    Marland Kitchen venlafaxine XR (EFFEXOR XR) 150 MG 24 hr capsule Take take one capsule at bedtime nightly to control hot flashes. (Patient taking differently: Take 150 mg by mouth daily as needed. Take take one capsule at bedtime nightly to control hot flashes.) 60 capsule 6  . VENTOLIN HFA 108 (90 BASE) MCG/ACT inhaler Inhale 2 puffs into the lungs 4 (four) times daily as needed for wheezing or shortness of breath.   0  . everolimus (AFINITOR) 7.5 MG tablet Take 1 tablet (7.5 mg total) by mouth daily. (Patient not taking: Reported on  11/24/2014) 28 tablet 0  . [DISCONTINUED] loratadine (CLARITIN) 10 MG tablet Take 10 mg by mouth daily as needed. For allergies     Current Facility-Administered Medications on File Prior to Visit  Medication Dose Route Frequency Provider Last Rate Last Dose  . 1 mL EPINEPHrine 1 mg/mL (1:1000) in 0.9% Normal Saline 3000 mL irrigation    PRN Carole Civil, MD        Past Surgical History  Procedure Laterality Date  . Knee surgery      right-arthroscopy  . Mastectomy      right side  . Abdominal hysterectomy      Denies any headaches, double vision, fevers, chills, night sweats, vomiting, diarrhea, constipation, chest pain, heart palpitations, shortness of breath, blood in stool, black tarry stool, urinary pain, urinary burning, urinary frequency, hematuria.   PHYSICAL EXAMINATION  ECOG PERFORMANCE STATUS: 1 - Symptomatic but completely ambulatory  Filed Vitals:   11/24/14 1127  BP: 143/69  Pulse: 102  Temp: 97.9 F (36.6 C)  Resp: 20    GENERAL:alert, no distress, well developed, comfortable, cooperative, obese looking much better today   SKIN: skin color, texture, turgor are normal, no rashes or significant lesions HEAD: Normocephalic, No masses, lesions, tenderness or abnormalities EYES: normal, PERRLA EARS: External ears normal OROPHARYNX:no exudate, no erythema, lips, buccal mucosa, and tongue normal and mucous membranes are moist.  No sores noted on exam. NECK: supple, trachea midline LYMPH:  No neck adenopathy BREAST:not examined LUNGS: CTA B/L HEART: RRR without murmur ABDOMEN:abdomen soft, non-tender and obese BACK: Back symmetric, no curvature. EXTREMITIES:less then 2 second capillary refill, no joint deformities, effusion, or inflammation, no skin discoloration, no cyanosis  NEURO: alert & oriented x 3 with fluent speech, no focal motor/sensory deficits, gait normal   LABORATORY DATA: CBC    Component Value Date/Time   WBC 4.7 11/24/2014 0953   RBC  4.87 11/24/2014 0953   HGB 13.4 11/24/2014 0953   HCT 42.0 11/24/2014 0953   PLT 182 11/24/2014 0953   MCV 86.2 11/24/2014 0953   MCH 27.5 11/24/2014 0953   MCHC 31.9 11/24/2014 0953   RDW 13.6 11/24/2014 0953   LYMPHSABS 2.0 11/24/2014 0953   MONOABS 0.5 11/24/2014 0953   EOSABS 0.1 11/24/2014 0953   BASOSABS 0.0 11/24/2014 0953      Chemistry      Component Value Date/Time   NA 137 11/24/2014 0953   K 4.0 11/24/2014 0953   CL 101 11/24/2014 0953   CO2 27 11/24/2014 0953   BUN 20 11/24/2014 0953   CREATININE 1.44* 11/24/2014 0953      Component Value Date/Time   CALCIUM 9.9 11/24/2014 0953   ALKPHOS 84 11/24/2014 0953   AST 38 11/24/2014 0953   ALT 49 11/24/2014 0953   BILITOT 0.8 11/24/2014 0953     Lab Results  Component Value Date   LABCA2 60.0* 11/13/2014      PENDING LABS:   RADIOGRAPHIC STUDIES:  No results found.   PATHOLOGY: As previously noted    ASSESSMENT AND PLAN:  Invasive ductal carcinoma of breast Stage IV Breast cancer with bone involvement requiring Xgeva.  Previously on Faslodex and Aromasin until imaging demonstrated a new hepatic lesion resulting in biopsy proven progression of disease.  Now on Aromasin + Afinitor.  Oncology history is updated.  Afinitor dose decreased to 7.5 mg with continuation of all other treatments.  She will start 7.5 mg of Afinitor today.  Oncology history is updated accordingly.  Based upon results from the SWISH trial, I will prescribe Dexamethasone mouthwash.  SWISH trial demonstrated a significant reduction in grade 2 or higher stomatitis with Dexamethsone mouthwash on evaluation on week 8 of therapy with a reported incidence of grade 2 or greater stomatitis of 2.4% (compared to 33% in BOLERA-2 trial).  Rx for Dexamethasone provided on 10/24/2014: 0.33m/5mL, swish for 2 minutes and spit QID x 8 weeks (or more).  Do not eat 1 hour after using.   She is continuing with this intervention.  [facility]  [facility]ATE.  DNR form filled out and provided to the patient on 10/24/2014.  Xgeva today, if treatment parameters are met.  She declines influenza vaccine.  Continue Dexamethasone Swish and Swallow.  Labs today as ordered.  Hyperglycemia noted.  I recommended 12 units of insulin today in the clinic, but patient declined.  She reports that she will take it at home.  I recommended 12 units of insulin.  I will send a message to Dr. Karie Kirks to help with her hyperglycemia control.  Weight is stable.  Return in ~10  Days for follow-up and labs.  Sliding Scale: 151-200: 2 units 201-250: 4 units 251- 300: 6 units Greater than 300: 8 units and call MD.   THERAPY PLAN:  Continue Exemestane daily. Will be mindful of Afinitor side effects including, but not limited to, stomatitis and pneumonitis.    All questions were answered. The patient knows to call the clinic with any problems, questions or concerns. We can certainly see the patient much sooner if necessary.  Patient and plan discussed with Dr. Ancil Linsey and she is in agreement with the aforementioned.   This note is electronically signed by: Doy Mince 11/24/2014 12:15 PM

## 2014-11-23 NOTE — Assessment & Plan Note (Addendum)
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  Previously on Faslodex and Aromasin until imaging demonstrated a new hepatic lesion resulting in biopsy proven progression of disease.  Now on Aromasin + Afinitor.  Oncology history is updated.  Afinitor dose decreased to 7.5 mg with continuation of all other treatments.  She will start 7.5 mg of Afinitor today.  Oncology history is updated accordingly.  Based upon results from the SWISH trial, I will prescribe Dexamethasone mouthwash.  SWISH trial demonstrated a significant reduction in grade 2 or higher stomatitis with Dexamethsone mouthwash on evaluation on week 8 of therapy with a reported incidence of grade 2 or greater stomatitis of 2.4% (compared to 33% in BOLERA-2 trial).  Rx for Dexamethasone provided on 10/24/2014: 0.2m/5mL, swish for 2 minutes and spit QID x 8 weeks (or more).  Do not eat 1 hour after using.   She is continuing with this intervention.  [facility]  DO NOT RESUSCITATE.  DNR form filled out and provided to the patient on 10/24/2014.  Xgeva today, if treatment parameters are met.  She declines influenza vaccine.  Continue Dexamethasone Swish and Swallow.  Labs today as ordered.  Hyperglycemia noted.  I recommended 12 units of insulin today in the clinic, but patient declined.  She reports that she will take it at home.  I recommended 12 units of insulin.  I will send a message to Dr. KKarie Kirksto help with her hyperglycemia control.  Weight is stable.  Return in ~10  Days for follow-up and labs.  Sliding Scale: 151-200: 2 units 201-250: 4 units 251- 300: 6 units Greater than 300: 8 units and call MD.

## 2014-11-24 ENCOUNTER — Encounter (HOSPITAL_BASED_OUTPATIENT_CLINIC_OR_DEPARTMENT_OTHER): Payer: Medicare Other

## 2014-11-24 ENCOUNTER — Encounter (HOSPITAL_COMMUNITY): Payer: Medicare Other

## 2014-11-24 ENCOUNTER — Encounter (HOSPITAL_COMMUNITY): Payer: Medicare Other | Attending: Oncology | Admitting: Oncology

## 2014-11-24 VITALS — BP 143/69 | HR 102 | Temp 97.9°F | Resp 20 | Wt 284.5 lb

## 2014-11-24 DIAGNOSIS — R739 Hyperglycemia, unspecified: Secondary | ICD-10-CM

## 2014-11-24 DIAGNOSIS — C787 Secondary malignant neoplasm of liver and intrahepatic bile duct: Secondary | ICD-10-CM | POA: Diagnosis not present

## 2014-11-24 DIAGNOSIS — C7951 Secondary malignant neoplasm of bone: Secondary | ICD-10-CM | POA: Diagnosis not present

## 2014-11-24 DIAGNOSIS — C50919 Malignant neoplasm of unspecified site of unspecified female breast: Secondary | ICD-10-CM | POA: Diagnosis not present

## 2014-11-24 DIAGNOSIS — Z66 Do not resuscitate: Secondary | ICD-10-CM | POA: Diagnosis not present

## 2014-11-24 LAB — CBC WITH DIFFERENTIAL/PLATELET
BASOS ABS: 0 10*3/uL (ref 0.0–0.1)
BASOS PCT: 1 %
EOS ABS: 0.1 10*3/uL (ref 0.0–0.7)
Eosinophils Relative: 3 %
HCT: 42 % (ref 36.0–46.0)
HEMOGLOBIN: 13.4 g/dL (ref 12.0–15.0)
Lymphocytes Relative: 43 %
Lymphs Abs: 2 10*3/uL (ref 0.7–4.0)
MCH: 27.5 pg (ref 26.0–34.0)
MCHC: 31.9 g/dL (ref 30.0–36.0)
MCV: 86.2 fL (ref 78.0–100.0)
Monocytes Absolute: 0.5 10*3/uL (ref 0.1–1.0)
Monocytes Relative: 10 %
NEUTROS PCT: 43 %
Neutro Abs: 2.1 10*3/uL (ref 1.7–7.7)
Platelets: 182 10*3/uL (ref 150–400)
RBC: 4.87 MIL/uL (ref 3.87–5.11)
RDW: 13.6 % (ref 11.5–15.5)
WBC: 4.7 10*3/uL (ref 4.0–10.5)

## 2014-11-24 LAB — COMPREHENSIVE METABOLIC PANEL
ALBUMIN: 3.7 g/dL (ref 3.5–5.0)
ALK PHOS: 84 U/L (ref 38–126)
ALT: 49 U/L (ref 14–54)
ANION GAP: 9 (ref 5–15)
AST: 38 U/L (ref 15–41)
BUN: 20 mg/dL (ref 6–20)
CALCIUM: 9.9 mg/dL (ref 8.9–10.3)
CO2: 27 mmol/L (ref 22–32)
CREATININE: 1.44 mg/dL — AB (ref 0.44–1.00)
Chloride: 101 mmol/L (ref 101–111)
GFR calc Af Amer: 45 mL/min — ABNORMAL LOW (ref >60)
GFR calc non Af Amer: 39 mL/min — ABNORMAL LOW (ref >60)
GLUCOSE: 315 mg/dL — AB (ref 65–99)
Potassium: 4 mmol/L (ref 3.5–5.1)
SODIUM: 137 mmol/L (ref 135–145)
Total Bilirubin: 0.8 mg/dL (ref 0.3–1.2)
Total Protein: 7.2 g/dL (ref 6.5–8.1)

## 2014-11-24 MED ORDER — DENOSUMAB 120 MG/1.7ML ~~LOC~~ SOLN
120.0000 mg | Freq: Once | SUBCUTANEOUS | Status: AC
  Administered 2014-11-24: 120 mg via SUBCUTANEOUS
  Filled 2014-11-24: qty 1.7

## 2014-11-24 MED ORDER — OXYCODONE HCL 10 MG PO TABS: ORAL_TABLET | ORAL | Status: DC

## 2014-11-24 NOTE — Progress Notes (Signed)
LABS DRAWN

## 2014-11-24 NOTE — Progress Notes (Signed)
See office visit encounter. 

## 2014-11-24 NOTE — Patient Instructions (Addendum)
[facility] Discharge Instructions  RECOMMENDATIONS MADE BY THE CONSULTANT AND ANY TEST RESULTS WILL BE SENT TO YOUR REFERRING PHYSICIAN.  Restart AFINITOR 7.5 mg daily. Xgeva 120 mg injection given today as ordered. Lab work again in 10 days. YOU ARE TO TAKE 12 UNITS INSULIN TODAY AT HOME. [facility] for oxycodone given to patient today. Lab work prior to xgeva injection every 4 weeks. Return as scheduled.  Thank you for choosing [place] at [facility] to provide your oncology and hematology care.  To afford each patient quality time with our provider, please arrive at least 15 minutes before your scheduled appointment time.    You need to re-schedule your appointment should you arrive 10 or more minutes late.  We strive to give you quality time with our providers, and arriving late affects you and other patients whose appointments are after yours.  Also, if you no show three or more times for appointments you may be dismissed from the clinic at the providers discretion.     Again, thank you for choosing [facility].  Our hope is that these requests will decrease the amount of time that you wait before being seen by our physicians.       _____________________________________________________________  Should you have questions after your visit to [facility], please contact our office at [phone] between the hours of 8:30 a.m. and 4:30 p.m.  Voicemails left after 4:30 p.m. will not be returned until the following business day.  For prescription refill requests, have your pharmacy contact our office.

## 2014-11-24 NOTE — Progress Notes (Signed)
Kaylee Mitchell presents today for injection per MD orders. Xgeva 120 mg  administered SQ in left Abdomen. Administration without incident. Patient tolerated well.

## 2014-11-25 DIAGNOSIS — C787 Secondary malignant neoplasm of liver and intrahepatic bile duct: Secondary | ICD-10-CM | POA: Diagnosis not present

## 2014-11-25 DIAGNOSIS — C50911 Malignant neoplasm of unspecified site of right female breast: Secondary | ICD-10-CM | POA: Diagnosis not present

## 2014-11-25 DIAGNOSIS — E1065 Type 1 diabetes mellitus with hyperglycemia: Secondary | ICD-10-CM | POA: Diagnosis not present

## 2014-11-25 DIAGNOSIS — E1042 Type 1 diabetes mellitus with diabetic polyneuropathy: Secondary | ICD-10-CM | POA: Diagnosis not present

## 2014-12-04 NOTE — Assessment & Plan Note (Signed)
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  Previously on Faslodex and Aromasin until imaging demonstrated a new hepatic lesion resulting in biopsy proven progression of disease.  Now on Aromasin + Afinitor (7.5 mg beginning on 11/24/2014, reduced from 10 mg).  Oncology history is up-to-date.  Afinitor dose decreased to 7.5 mg with continuation of all other treatments.    Based upon results from the SWISH trial, I will prescribe Dexamethasone mouthwash.  SWISH trial demonstrated a significant reduction in grade 2 or higher stomatitis with Dexamethsone mouthwash on evaluation on week 8 of therapy with a reported incidence of grade 2 or greater stomatitis of 2.4% (compared to 33% in BOLERA-2 trial).  Rx for Dexamethasone provided on 10/24/2014: 0.5mg /40mL, swish for 2 minutes and spit QID x 8 weeks (or more).  Do not eat 1 hour after using.   She is continuing with this intervention.  Pain contract in place with [facility].  DO NOT RESUSCITATE.  DNR form filled out and provided to the patient on 10/24/2014.  Xgeva due ~ 12/5.  Continue Dexamethasone Swish and Swallow.  Return in 2 weeks for follow-up and labs.  Sliding Scale: 151-200: 2 units 201-250: 4 units 251- 300: 6 units Greater than 300: 8 units and call MD.

## 2014-12-04 NOTE — Progress Notes (Signed)
[name], MD [place] [address]  Invasive ductal carcinoma of breast, unspecified laterality ([place])  CURRENT THERAPY: Afinitor (7.5 mg) + Exemestane.  Xgeva monthly.  INTERVAL HISTORY: NAUDIA CROSLEY 59 y.o. female returns for followup of Stage IV breast cancer, ER/PR+, HER2 neg, with new hepatic metastasis (biopsy proven).    Invasive ductal carcinoma of breast ([place])   05/26/2009 Initial Diagnosis Needle core biopsy showed invasive mammary carcinoma with 1 + lymph node   06/03/2009 Surgery Right modified radical mastectomy with 8/8 + lymph nodes by Dr. Ronnell Freshwater   07/22/2009 Cancer Staging Bone scan + for multiple osseous lesions.  No disease elsewhere.   07/24/2009 - 03/23/2010 Chemotherapy Arimidex   03/23/2010 Progression    03/23/2010 - 12/05/2012 Chemotherapy Tamoxifen   11/28/2012 Progression Worsening of lumbar spine osseous disease   12/06/2012 - 09/25/2014 Chemotherapy Fulvestrant + Exemestane   01/24/2014 PET scan No evidence of hypermetabolic recurrent or metastatic disease. Widespread osseous metastasis, without significant marrow hypermetabolism.   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   10/20/2014 - 11/13/2014 Chemotherapy Afinitor + Exemestane   11/13/2014 Adverse Reaction Increasing fatigue   11/13/2014 Treatment Plan Change Hold Afinitor, will reduce dose to 7.5 mg daily.  Continue Exemestane and Xgeva.   11/24/2014 -  Chemotherapy Afinitor 7.5 mg daily, Exemestane daily, and continuation of Xgeva.   I personally reviewed and went over laboratory results with the patient.  The results are noted within this dictation.   Labs today are good for her.  Her glucose is 152.  She notes that she is tolerating the reduced dose of Afinitor better.  She notes that her taste for foods is decreased, but even that is improving slowly.  She otherwise denies any complaints or  issues.   Past Medical History  Diagnosis Date  . Hypertension   . High cholesterol   . Knee pain, bilateral 2013  . Cancer ([place])     rt breast is primary  . Bone metastasis ([place]) 09/13/2010  . Diabetes mellitus     type II  . Depression   . Depression 12/14/2011  . Invasive ductal carcinoma of breast ([place]) 09/13/2010  . Opioid contract exists with [facility] 10/24/2014    Contract found in Chart review in letter tab.  . 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     has No Known Allergies.  Current Outpatient Prescriptions on File Prior to Visit  Medication Sig Dispense Refill  . aspirin EC 81 MG tablet Take 81 mg by mouth daily.      Marland Kitchen atorvastatin (LIPITOR) 20 MG tablet TAKE ONE TABLET BY MOUTH ONCE DAILY 30 tablet 0  . calcium-vitamin D (OSCAL WITH D) 500-200 MG-UNIT per tablet Take 1 tablet by mouth 2 (two) times daily.     . carvedilol (COREG) 12.5 MG tablet TAKE ONE TABLET BY MOUTH TWICE DAILY WITH MEALS 180 tablet 0  . cyclobenzaprine (FLEXERIL) 10 MG tablet Take 1 tablet (10  mg total) by mouth 3 (three) times daily as needed for muscle spasms. 10 tablet 0  . diphenhydrAMINE (BENADRYL) 25 MG tablet Take 25 mg by mouth at bedtime as needed.    . docusate sodium (STOOL SOFTENER) 100 MG capsule Take 100 mg by mouth daily as needed for mild constipation.     Marland Kitchen everolimus (AFINITOR) 7.5 MG tablet Take 1 tablet (7.5 mg total) by mouth daily. 28 tablet 0  . exemestane  (AROMASIN) 25 MG tablet Take 1 tablet (25 mg total) by mouth daily after breakfast. 90 tablet 3  . gabapentin (NEURONTIN) 300 MG capsule Take 300 mg by mouth 3 (three) times daily.  3  . Insulin Glargine (LANTUS) 100 UNIT/ML Solostar Pen Inject 60 Units into the skin at bedtime.    Marland Kitchen LORazepam (ATIVAN PO) Take 1 tablet by mouth daily as needed (anxiety).     Marland Kitchen losartan-hydrochlorothiazide (HYZAAR) 50-12.5 MG per tablet Take 1 tablet by mouth daily.    . metFORMIN (GLUCOPHAGE) 500 MG tablet Take 500 mg by mouth 2 (two) times daily with a meal.    . ondansetron (ZOFRAN) 8 MG tablet Take 1 tablet (8 mg total) by mouth every 8 (eight) hours as needed for nausea or vomiting. 30 tablet 3  . Oxycodone HCl 10 MG TABS Take 1 tablet every 4 hours as needed for pain. 100 tablet 0  . Polyethylene Glycol 3350 (MIRALAX PO) Take 17 g by mouth daily as needed (constipation).     . potassium chloride SA (K-DUR,KLOR-CON) 20 MEQ tablet Take 1 tablet (20 mEq total) by mouth daily. 90 tablet 4  . sertraline (ZOLOFT) 100 MG tablet Take 100 mg by mouth daily.    Marland Kitchen venlafaxine XR (EFFEXOR XR) 150 MG 24 hr capsule Take take one capsule at bedtime nightly to control hot flashes. (Patient taking differently: Take 150 mg by mouth daily as needed. Take take one capsule at bedtime nightly to control hot flashes.) 60 capsule 6  . VENTOLIN HFA 108 (90 BASE) MCG/ACT inhaler Inhale 2 puffs into the lungs 4 (four) times daily as needed for wheezing or shortness of breath.   0  . AFINITOR 10 MG tablet Take 7.5 mg by mouth daily.     Marland Kitchen dexamethasone (DECADRON) 0.5 MG/5ML solution Take 10 mLs (1 mg total) by mouth 4 (four) times daily. (Patient not taking: Reported on 12/05/2014) 560 mL 4  . omeprazole (PRILOSEC) 40 MG capsule TAKE 1 CAPSULE (40 MG TOTAL) BY MOUTH DAILY. (Patient not taking: Reported on 12/05/2014) 30 capsule 3  . [DISCONTINUED] loratadine (CLARITIN) 10 MG tablet Take 10 mg by mouth daily as needed. For allergies      Current Facility-Administered Medications on File Prior to Visit  Medication Dose Route Frequency Provider Last Rate Last Dose  . 1 mL EPINEPHrine 1 mg/mL (1:1000) in 0.9% Normal Saline 3000 mL irrigation    PRN Carole Civil, MD        Past Surgical History  Procedure Laterality Date  . Knee surgery      right-arthroscopy  . Mastectomy      right side  . Abdominal hysterectomy      Denies any headaches, double vision, fevers, chills, night sweats, vomiting, diarrhea, constipation, chest pain, heart palpitations, shortness of breath, blood in stool, black tarry stool, urinary pain, urinary burning, urinary frequency, hematuria.   PHYSICAL EXAMINATION  ECOG PERFORMANCE STATUS: 1 - Symptomatic but completely ambulatory  Filed Vitals:   12/05/14 1147  BP: 149/95  Pulse: 103  Temp: 98.3 F (36.8 C)  Resp: 18    GENERAL:alert, no distress, well developed, comfortable, cooperative, obese looking much better today   SKIN: skin color, texture, turgor are normal, no rashes or significant lesions HEAD: Normocephalic, No masses, lesions, tenderness or abnormalities EYES: normal, PERRLA EARS: External ears normal OROPHARYNX:no exudate, no erythema, lips, buccal mucosa, and tongue normal and mucous membranes are moist.  No sores noted on exam. NECK: supple, trachea midline LYMPH:  No neck adenopathy BREAST:not examined LUNGS: CTA B/L HEART: RRR without murmur ABDOMEN:abdomen soft, non-tender and obese BACK: Back symmetric, no curvature. EXTREMITIES:less then 2 second capillary refill, no joint deformities, effusion, or inflammation, no skin discoloration, no cyanosis  NEURO: alert & oriented x 3 with fluent speech, no focal motor/sensory deficits, gait normal   LABORATORY DATA: CBC    Component Value Date/Time   WBC 4.6 12/05/2014 1033   RBC 5.17* 12/05/2014 1033   HGB 14.4 12/05/2014 1033   HCT 44.1 12/05/2014 1033   PLT 148* 12/05/2014 1033   MCV 85.3 12/05/2014  1033   MCH 27.9 12/05/2014 1033   MCHC 32.7 12/05/2014 1033   RDW 13.3 12/05/2014 1033   LYMPHSABS 2.2 12/05/2014 1033   MONOABS 0.5 12/05/2014 1033   EOSABS 0.2 12/05/2014 1033   BASOSABS 0.0 12/05/2014 1033      Chemistry      Component Value Date/Time   NA 141 12/05/2014 1033   K 3.9 12/05/2014 1033   CL 106 12/05/2014 1033   CO2 25 12/05/2014 1033   BUN 24* 12/05/2014 1033   CREATININE 1.30* 12/05/2014 1033      Component Value Date/Time   CALCIUM 9.0 12/05/2014 1033   ALKPHOS 75 12/05/2014 1033   AST 27 12/05/2014 1033   ALT 24 12/05/2014 1033   BILITOT 0.6 12/05/2014 1033     Lab Results  Component Value Date   LABCA2 60.0* 11/13/2014      PENDING LABS:   RADIOGRAPHIC STUDIES:  No results found.   PATHOLOGY: As previously noted    ASSESSMENT AND PLAN:  Invasive ductal carcinoma of breast Stage IV Breast cancer with bone involvement requiring Xgeva.  Previously on Faslodex and Aromasin until imaging demonstrated a new hepatic lesion resulting in biopsy proven progression of disease.  Now on Aromasin + Afinitor (7.5 mg beginning on 11/24/2014, reduced from 10 mg).  Oncology history is up-to-date.  Afinitor dose decreased to 7.5 mg with continuation of all other treatments.    Based upon results from the SWISH trial, I will prescribe Dexamethasone mouthwash.  SWISH trial demonstrated a significant reduction in grade 2 or higher stomatitis with Dexamethsone mouthwash on evaluation on week 8 of therapy with a reported incidence of grade 2 or greater stomatitis of 2.4% (compared to 33% in BOLERA-2 trial).  Rx for Dexamethasone provided on 10/24/2014: 0.93m/5mL, swish for 2 minutes and spit QID x 8 weeks (or more).  Do not eat 1 hour after using.   She is continuing with this intervention.  [facility]  DO NOT RESUSCITATE.  DNR form filled out and provided to the patient on 10/24/2014.  Xgeva due ~ 12/5.  Continue  Dexamethasone Swish and Swallow.  Return in 2 weeks for follow-up and labs.  Sliding Scale: 151-200: 2 units 201-250: 4 units 251- 300: 6 units Greater than 300: 8 units and call MD.   THERAPY PLAN:  Continue Exemestane daily. Will be mindful of Afinitor side effects  including, but not limited to, stomatitis and pneumonitis.    All questions were answered. The patient knows to call the clinic with any problems, questions or concerns. We can certainly see the patient much sooner if necessary.  Patient and plan discussed with Dr. Ancil Linsey and she is in agreement with the aforementioned.   This note is electronically signed by: Doy Mince 12/05/2014 12:44 PM

## 2014-12-05 ENCOUNTER — Encounter (HOSPITAL_BASED_OUTPATIENT_CLINIC_OR_DEPARTMENT_OTHER): Payer: Medicare Other | Admitting: Oncology

## 2014-12-05 ENCOUNTER — Encounter (HOSPITAL_COMMUNITY): Payer: Self-pay | Admitting: Oncology

## 2014-12-05 ENCOUNTER — Encounter (HOSPITAL_COMMUNITY): Payer: Medicare Other

## 2014-12-05 VITALS — BP 149/95 | HR 103 | Temp 98.3°F | Resp 18 | Wt 283.4 lb

## 2014-12-05 DIAGNOSIS — C7951 Secondary malignant neoplasm of bone: Secondary | ICD-10-CM | POA: Diagnosis not present

## 2014-12-05 DIAGNOSIS — C50919 Malignant neoplasm of unspecified site of unspecified female breast: Secondary | ICD-10-CM | POA: Diagnosis not present

## 2014-12-05 LAB — COMPREHENSIVE METABOLIC PANEL
ALK PHOS: 75 U/L (ref 38–126)
ALT: 24 U/L (ref 14–54)
AST: 27 U/L (ref 15–41)
Albumin: 3.7 g/dL (ref 3.5–5.0)
Anion gap: 10 (ref 5–15)
BUN: 24 mg/dL — AB (ref 6–20)
CALCIUM: 9 mg/dL (ref 8.9–10.3)
CO2: 25 mmol/L (ref 22–32)
CREATININE: 1.3 mg/dL — AB (ref 0.44–1.00)
Chloride: 106 mmol/L (ref 101–111)
GFR, EST AFRICAN AMERICAN: 50 mL/min — AB (ref >60)
GFR, EST NON AFRICAN AMERICAN: 43 mL/min — AB (ref >60)
Glucose, Bld: 152 mg/dL — ABNORMAL HIGH (ref 65–99)
Potassium: 3.9 mmol/L (ref 3.5–5.1)
Sodium: 141 mmol/L (ref 135–145)
Total Bilirubin: 0.6 mg/dL (ref 0.3–1.2)
Total Protein: 7.6 g/dL (ref 6.5–8.1)

## 2014-12-05 LAB — CBC WITH DIFFERENTIAL/PLATELET
BASOS ABS: 0 10*3/uL (ref 0.0–0.1)
Basophils Relative: 0 %
Eosinophils Absolute: 0.2 10*3/uL (ref 0.0–0.7)
Eosinophils Relative: 4 %
HCT: 44.1 % (ref 36.0–46.0)
HEMOGLOBIN: 14.4 g/dL (ref 12.0–15.0)
LYMPHS ABS: 2.2 10*3/uL (ref 0.7–4.0)
LYMPHS PCT: 47 %
MCH: 27.9 pg (ref 26.0–34.0)
MCHC: 32.7 g/dL (ref 30.0–36.0)
MCV: 85.3 fL (ref 78.0–100.0)
Monocytes Absolute: 0.5 10*3/uL (ref 0.1–1.0)
Monocytes Relative: 10 %
NEUTROS ABS: 1.8 10*3/uL (ref 1.7–7.7)
NEUTROS PCT: 39 %
Platelets: 148 10*3/uL — ABNORMAL LOW (ref 150–400)
RBC: 5.17 MIL/uL — AB (ref 3.87–5.11)
RDW: 13.3 % (ref 11.5–15.5)
WBC: 4.6 10*3/uL (ref 4.0–10.5)

## 2014-12-05 NOTE — Patient Instructions (Signed)
[place] at [facility] Discharge Instructions  RECOMMENDATIONS MADE BY THE CONSULTANT AND ANY TEST RESULTS WILL BE SENT TO YOUR REFERRING PHYSICIAN.  Exam and discussion by Robynn Pane, PA-C Will continue Afiintor and Exemestane Call with any concerns Report any new lumps, bone pain, shortness of breath or other symptoms.  Follow-up in 2 weeks with labs, xgeva and office visit.  Thank you for choosing [place] at [facility] to provide your oncology and hematology care.  To afford each patient quality time with our provider, please arrive at least 15 minutes before your scheduled appointment time.    You need to re-schedule your appointment should you arrive 10 or more minutes late.  We strive to give you quality time with our providers, and arriving late affects you and other patients whose appointments are after yours.  Also, if you no show three or more times for appointments you may be dismissed from the clinic at the providers discretion.     Again, thank you for choosing [facility].  Our hope is that these requests will decrease the amount of time that you wait before being seen by our physicians.       _____________________________________________________________  Should you have questions after your visit to [facility], please contact our office at [phone] between the hours of 8:30 a.m. and 4:30 p.m.  Voicemails left after 4:30 p.m. will not be returned until the following business day.  For prescription refill requests, have your pharmacy contact our office.

## 2014-12-18 ENCOUNTER — Other Ambulatory Visit (HOSPITAL_COMMUNITY): Payer: Self-pay

## 2014-12-18 DIAGNOSIS — C7951 Secondary malignant neoplasm of bone: Secondary | ICD-10-CM

## 2014-12-18 DIAGNOSIS — C50919 Malignant neoplasm of unspecified site of unspecified female breast: Secondary | ICD-10-CM

## 2014-12-19 ENCOUNTER — Encounter (HOSPITAL_COMMUNITY): Payer: Medicare Other | Attending: Oncology

## 2014-12-19 ENCOUNTER — Encounter (HOSPITAL_COMMUNITY): Payer: Self-pay | Admitting: Oncology

## 2014-12-19 ENCOUNTER — Encounter (HOSPITAL_COMMUNITY): Payer: Medicare Other | Attending: Oncology | Admitting: Oncology

## 2014-12-19 ENCOUNTER — Encounter (HOSPITAL_COMMUNITY): Payer: Medicare Other | Attending: Hematology & Oncology

## 2014-12-19 VITALS — BP 127/75 | HR 97 | Temp 97.8°F | Resp 18 | Wt 282.3 lb

## 2014-12-19 DIAGNOSIS — C50919 Malignant neoplasm of unspecified site of unspecified female breast: Secondary | ICD-10-CM | POA: Insufficient documentation

## 2014-12-19 DIAGNOSIS — Z66 Do not resuscitate: Secondary | ICD-10-CM | POA: Diagnosis not present

## 2014-12-19 DIAGNOSIS — E119 Type 2 diabetes mellitus without complications: Secondary | ICD-10-CM

## 2014-12-19 DIAGNOSIS — C7951 Secondary malignant neoplasm of bone: Secondary | ICD-10-CM | POA: Diagnosis not present

## 2014-12-19 DIAGNOSIS — C787 Secondary malignant neoplasm of liver and intrahepatic bile duct: Secondary | ICD-10-CM

## 2014-12-19 LAB — COMPREHENSIVE METABOLIC PANEL
ALBUMIN: 3.5 g/dL (ref 3.5–5.0)
ALT: 24 U/L (ref 14–54)
ANION GAP: 12 (ref 5–15)
AST: 26 U/L (ref 15–41)
Alkaline Phosphatase: 86 U/L (ref 38–126)
BILIRUBIN TOTAL: 0.7 mg/dL (ref 0.3–1.2)
BUN: 27 mg/dL — ABNORMAL HIGH (ref 6–20)
CHLORIDE: 101 mmol/L (ref 101–111)
CO2: 27 mmol/L (ref 22–32)
Calcium: 9.5 mg/dL (ref 8.9–10.3)
Creatinine, Ser: 1.38 mg/dL — ABNORMAL HIGH (ref 0.44–1.00)
GFR calc Af Amer: 47 mL/min — ABNORMAL LOW (ref >60)
GFR calc non Af Amer: 40 mL/min — ABNORMAL LOW (ref >60)
GLUCOSE: 276 mg/dL — AB (ref 65–99)
POTASSIUM: 3.9 mmol/L (ref 3.5–5.1)
SODIUM: 140 mmol/L (ref 135–145)
TOTAL PROTEIN: 7.2 g/dL (ref 6.5–8.1)

## 2014-12-19 LAB — CBC WITH DIFFERENTIAL/PLATELET
BASOS ABS: 0 10*3/uL (ref 0.0–0.1)
BASOS PCT: 0 %
EOS ABS: 0.1 10*3/uL (ref 0.0–0.7)
EOS PCT: 2 %
HCT: 41.3 % (ref 36.0–46.0)
Hemoglobin: 13.3 g/dL (ref 12.0–15.0)
Lymphocytes Relative: 43 %
Lymphs Abs: 2.3 10*3/uL (ref 0.7–4.0)
MCH: 27.3 pg (ref 26.0–34.0)
MCHC: 32.2 g/dL (ref 30.0–36.0)
MCV: 84.8 fL (ref 78.0–100.0)
MONO ABS: 0.4 10*3/uL (ref 0.1–1.0)
MONOS PCT: 7 %
Neutro Abs: 2.6 10*3/uL (ref 1.7–7.7)
Neutrophils Relative %: 48 %
PLATELETS: 188 10*3/uL (ref 150–400)
RBC: 4.87 MIL/uL (ref 3.87–5.11)
RDW: 13.1 % (ref 11.5–15.5)
WBC: 5.4 10*3/uL (ref 4.0–10.5)

## 2014-12-19 LAB — CANCER ANTIGEN 15-3: Cancer Antigen-Breast 15-3: 37 U/mL — ABNORMAL HIGH (ref <32)

## 2014-12-19 MED ORDER — DENOSUMAB 120 MG/1.7ML ~~LOC~~ SOLN
120.0000 mg | Freq: Once | SUBCUTANEOUS | Status: AC
  Administered 2014-12-19: 120 mg via SUBCUTANEOUS
  Filled 2014-12-19: qty 1.7

## 2014-12-19 MED ORDER — OXYCODONE HCL 10 MG PO TABS: ORAL_TABLET | ORAL | Status: DC

## 2014-12-19 NOTE — Patient Instructions (Signed)
[place] at [facility] Discharge Instructions  RECOMMENDATIONS MADE BY THE CONSULTANT AND ANY TEST RESULTS WILL BE SENT TO YOUR REFERRING PHYSICIAN.  Exam and discussion by Robynn Pane, PA-C [place] See today Take an additional 6 units of insulin when you get home Report any new lumps, bone pain, shortness of breath or other symptoms.  Follow-up: Labs and office  in 2 and 4 weeks  xgeva in 4 weeks.  Thank you for choosing [place] at [facility] to provide your oncology and hematology care.  To afford each patient quality time with our provider, please arrive at least 15 minutes before your scheduled appointment time.    You need to re-schedule your appointment should you arrive 10 or more minutes late.  We strive to give you quality time with our providers, and arriving late affects you and other patients whose appointments are after yours.  Also, if you no show three or more times for appointments you may be dismissed from the clinic at the providers discretion.     Again, thank you for choosing [facility].  Our hope is that these requests will decrease the amount of time that you wait before being seen by our physicians.       _____________________________________________________________  Should you have questions after your visit to [facility], please contact our office at [phone] between the hours of 8:30 a.m. and 4:30 p.m.  Voicemails left after 4:30 p.m. will not be returned until the following business day.  For prescription refill requests, have your pharmacy contact our office.

## 2014-12-19 NOTE — Progress Notes (Signed)
[name], MD [place] [address]  Invasive ductal carcinoma of breast, unspecified laterality ([place]) - Plan: CBC with Differential, Comprehensive metabolic panel, CBC with Differential, Comprehensive metabolic panel, Cancer antigen 27.29, Cancer antigen 15-3  Bone metastasis (HCC) - Plan: Oxycodone HCl 10 MG TABS  CURRENT THERAPY: Afinitor (7.5 mg) + Exemestane.  Xgeva monthly.  INTERVAL HISTORY: Kaylee Mitchell 59 y.o. female returns for followup of Stage IV breast cancer, ER/PR+, HER2 neg, with new hepatic metastasis (biopsy proven).    Invasive ductal carcinoma of breast ([place])   05/26/2009 Initial Diagnosis Needle core biopsy showed invasive mammary carcinoma with 1 + lymph node   06/03/2009 Surgery Right modified radical mastectomy with 8/8 + lymph nodes by Dr. Ronnell Freshwater   07/22/2009 Cancer Staging Bone scan + for multiple osseous lesions.  No disease elsewhere.   07/24/2009 - 03/23/2010 Chemotherapy Arimidex   03/23/2010 Progression    03/23/2010 - 12/05/2012 Chemotherapy Tamoxifen   11/28/2012 Progression Worsening of lumbar spine osseous disease   12/06/2012 - 09/25/2014 Chemotherapy Fulvestrant + Exemestane   01/24/2014 PET scan No evidence of hypermetabolic recurrent or metastatic disease. Widespread osseous metastasis, without significant marrow hypermetabolism.   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   10/20/2014 - 11/13/2014 Chemotherapy Afinitor + Exemestane   11/13/2014 Adverse Reaction Increasing fatigue   11/13/2014 Treatment Plan Change Hold Afinitor, will reduce dose to 7.5 mg daily.  Continue Exemestane and Xgeva.   11/24/2014 -  Chemotherapy Afinitor 7.5 mg daily, Exemestane daily, and continuation of Xgeva.   I personally reviewed and went over laboratory results with the patient.  The results are noted within this dictation.    She reports "I'm tired today."   She notes that she is not otherwise fatigued.  Just today.  She notes that her taste buds have changed.  There only certain foods that are palatable to her.  Her weight is decreased 2 lbs over 1 month.    Otherwise, she is doing well.  She requests a refill on her pain medication.  Past Medical History  Diagnosis Date  . Hypertension   . High cholesterol   . Knee pain, bilateral 2013  . Cancer ([place])     rt breast is primary  . Bone metastasis ([place]) 09/13/2010  . Diabetes mellitus     type II  . Depression   . Depression 12/14/2011  . Invasive ductal carcinoma of breast ([place]) 09/13/2010  . Opioid contract exists with [facility] 10/24/2014    Contract found in Chart review in letter tab.  . 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     has No Known Allergies.  Current Outpatient Prescriptions on File Prior to Visit  Medication Sig Dispense Refill  . AFINITOR 10 MG tablet Take 7.5 mg by mouth daily.     Marland Kitchen aspirin EC 81 MG tablet Take 81 mg by mouth daily.      Marland Kitchen atorvastatin (LIPITOR) 20 MG tablet TAKE ONE  TABLET BY MOUTH ONCE DAILY 30 tablet 0  . calcium-vitamin D (OSCAL WITH D) 500-200 MG-UNIT per tablet Take 1 tablet by mouth 2 (two) times daily.     . carvedilol (COREG) 12.5 MG tablet TAKE ONE TABLET BY MOUTH TWICE DAILY WITH MEALS 180 tablet 0  . cyclobenzaprine (FLEXERIL) 10 MG tablet Take 1 tablet (10 mg total) by mouth 3 (three) times daily as needed for muscle spasms.  10 tablet 0  . diphenhydrAMINE (BENADRYL) 25 MG tablet Take 25 mg by mouth at bedtime as needed.    . docusate sodium (STOOL SOFTENER) 100 MG capsule Take 100 mg by mouth daily as needed for mild constipation.     Marland Kitchen everolimus (AFINITOR) 7.5 MG tablet Take 1 tablet (7.5 mg total) by mouth daily. 28 tablet 0  . exemestane (AROMASIN) 25 MG tablet Take 1 tablet (25 mg total) by mouth daily after breakfast. 90 tablet 3  . Insulin Glargine (LANTUS) 100 UNIT/ML Solostar Pen Inject 60 Units into the skin at bedtime.    Marland Kitchen LORazepam (ATIVAN PO) Take 1 tablet by mouth daily as needed (anxiety).     Marland Kitchen losartan-hydrochlorothiazide (HYZAAR) 50-12.5 MG per tablet Take 1 tablet by mouth daily.    . metFORMIN (GLUCOPHAGE) 500 MG tablet Take 500 mg by mouth 2 (two) times daily with a meal.    . ondansetron (ZOFRAN) 8 MG tablet Take 1 tablet (8 mg total) by mouth every 8 (eight) hours as needed for nausea or vomiting. 30 tablet 3  . Polyethylene Glycol 3350 (MIRALAX PO) Take 17 g by mouth daily as needed (constipation).     . potassium chloride SA (K-DUR,KLOR-CON) 20 MEQ tablet Take 1 tablet (20 mEq total) by mouth daily. 90 tablet 4  . sertraline (ZOLOFT) 100 MG tablet Take 100 mg by mouth daily.    Marland Kitchen venlafaxine XR (EFFEXOR XR) 150 MG 24 hr capsule Take take one capsule at bedtime nightly to control hot flashes. (Patient taking differently: Take 150 mg by mouth daily as needed. Take take one capsule at bedtime nightly to control hot flashes.) 60 capsule 6  . VENTOLIN HFA 108 (90 BASE) MCG/ACT inhaler Inhale 2 puffs into the lungs 4 (four) times daily as needed for wheezing or shortness of breath.   0  . dexamethasone (DECADRON) 0.5 MG/5ML solution Take 10 mLs (1 mg total) by mouth 4 (four) times daily. (Patient not taking: Reported on 12/05/2014) 560 mL 4  . gabapentin (NEURONTIN) 300 MG capsule Take 300 mg by mouth 3 (three) times daily.  3  . omeprazole (PRILOSEC) 40 MG capsule TAKE 1 CAPSULE (40 MG TOTAL) BY  MOUTH DAILY. (Patient not taking: Reported on 12/05/2014) 30 capsule 3  . [DISCONTINUED] loratadine (CLARITIN) 10 MG tablet Take 10 mg by mouth daily as needed. For allergies     Current Facility-Administered Medications on File Prior to Visit  Medication Dose Route Frequency Provider Last Rate Last Dose  . 1 mL EPINEPHrine 1 mg/mL (1:1000) in 0.9% Normal Saline 3000 mL irrigation    PRN Carole Civil, MD        Past Surgical History  Procedure Laterality Date  . Knee surgery      right-arthroscopy  . Mastectomy      right side  . Abdominal hysterectomy      Denies any headaches, double vision, fevers, chills, night sweats, vomiting, diarrhea, constipation, chest pain, heart palpitations, shortness of breath, blood in stool, black tarry stool, urinary pain, urinary burning, urinary  frequency, hematuria.   PHYSICAL EXAMINATION  ECOG PERFORMANCE STATUS: 1 - Symptomatic but completely ambulatory  Filed Vitals:   12/19/14 0950  BP: 127/75  Pulse: 97  Temp: 97.8 F (36.6 C)  Resp: 18    GENERAL:alert, no distress, well developed, comfortable, cooperative, obese, smiling, unaccompanied SKIN: skin color, texture, turgor are normal, no rashes or significant lesions HEAD: Normocephalic, No masses, lesions, tenderness or abnormalities EYES: normal, PERRLA EARS: External ears normal OROPHARYNX:no exudate, no erythema, lips, buccal mucosa, and tongue normal and mucous membranes are moist.  No sores noted on exam. NECK: supple, trachea midline LYMPH:  No neck adenopathy BREAST:not examined LUNGS: CTA B/L HEART: RRR without murmur ABDOMEN:abdomen soft, non-tender and obese BACK: Back symmetric, no curvature. EXTREMITIES:less then 2 second capillary refill, no joint deformities, effusion, or inflammation, no skin discoloration, no cyanosis  NEURO: alert & oriented x 3 with fluent speech, no focal motor/sensory deficits, gait normal   LABORATORY DATA: CBC    Component Value  Date/Time   WBC 5.4 12/19/2014 0855   RBC 4.87 12/19/2014 0855   HGB 13.3 12/19/2014 0855   HCT 41.3 12/19/2014 0855   PLT 188 12/19/2014 0855   MCV 84.8 12/19/2014 0855   MCH 27.3 12/19/2014 0855   MCHC 32.2 12/19/2014 0855   RDW 13.1 12/19/2014 0855   LYMPHSABS 2.3 12/19/2014 0855   MONOABS 0.4 12/19/2014 0855   EOSABS 0.1 12/19/2014 0855   BASOSABS 0.0 12/19/2014 0855      Chemistry      Component Value Date/Time   NA 140 12/19/2014 0855   K 3.9 12/19/2014 0855   CL 101 12/19/2014 0855   CO2 27 12/19/2014 0855   BUN 27* 12/19/2014 0855   CREATININE 1.38* 12/19/2014 0855      Component Value Date/Time   CALCIUM 9.5 12/19/2014 0855   ALKPHOS 86 12/19/2014 0855   AST 26 12/19/2014 0855   ALT 24 12/19/2014 0855   BILITOT 0.7 12/19/2014 0855     Lab Results  Component Value Date   LABCA2 60.0* 11/13/2014      PENDING LABS:   RADIOGRAPHIC STUDIES:  No results found.   PATHOLOGY: As previously noted    ASSESSMENT AND PLAN:  Invasive ductal carcinoma of breast Stage IV Breast cancer with bone involvement requiring Xgeva.  Previously on Faslodex and Aromasin until imaging demonstrated a new hepatic lesion resulting in biopsy proven progression of disease.  Now on Aromasin + Afinitor (7.5 mg beginning on 11/24/2014, reduced from 10 mg).  Oncology history is up-to-date.  Afinitor dose decreased to 7.5 mg with continuation of all other treatments.    Based upon results from the SWISH trial, I will prescribe Dexamethasone mouthwash.  SWISH trial demonstrated a significant reduction in grade 2 or higher stomatitis with Dexamethsone mouthwash on evaluation on week 8 of therapy with a reported incidence of grade 2 or greater stomatitis of 2.4% (compared to 33% in BOLERA-2 trial).  Rx for Dexamethasone provided on 10/24/2014: 0.28m/5mL, swish for 2 minutes and spit QID x 8 weeks (or more).  Do not eat 1 hour after using.   She is continuing with this  intervention.  [facility]  DO NOT [facility]USCITATE.  DNR form filled out and provided to the patient on 10/24/2014.  Xgeva due today.  Continue Dexamethasone Swish and Swallow.  Refill on Oxycodone provided today.  Labs today demonstrate stable labs with a glucose of 276.  She is a candidate for  insulin today and she declines insulin today because "I'm not ready to go eat yet and I don't want my glucose to drop too low."  She admits to a change in taste and decreased appetite.  Her weight is down by 2 lbs over the past month.  She weighs in today at 282.3 lbs compared to 284.5 lbs on 11/24/2014.  CA27.29 and CA15-3 ordered and drawn today.  Based upon results, may need to consider re-imaging in near future.  Return in 2 weeks for follow-up and labs.  Sliding Scale: 151-200: 2 units 201-250: 4 units 251- 300: 6 units Greater than 300: 8 units and call MD.   THERAPY PLAN:  Continue Exemestane daily. Will be mindful of Afinitor side effects including, but not limited to, stomatitis and pneumonitis.    All questions were answered. The patient knows to call the clinic with any problems, questions or concerns. We can certainly see the patient much sooner if necessary.  Patient and plan discussed with Dr. Ancil Linsey and she is in agreement with the aforementioned.   This note is electronically signed by: Robynn Pane, PA-C 12/19/2014 1:19 PM

## 2014-12-19 NOTE — Assessment & Plan Note (Addendum)
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  Previously on Faslodex and Aromasin until imaging demonstrated a new hepatic lesion resulting in biopsy proven progression of disease.  Now on Aromasin + Afinitor (7.5 mg beginning on 11/24/2014, reduced from 10 mg).  Oncology history is up-to-date.  Afinitor dose decreased to 7.5 mg with continuation of all other treatments.    Based upon results from the SWISH trial, I will prescribe Dexamethasone mouthwash.  SWISH trial demonstrated a significant reduction in grade 2 or higher stomatitis with Dexamethsone mouthwash on evaluation on week 8 of therapy with a reported incidence of grade 2 or greater stomatitis of 2.4% (compared to 33% in BOLERA-2 trial).  Rx for Dexamethasone provided on 10/24/2014: 0.5mg /48mL, swish for 2 minutes and spit QID x 8 weeks (or more).  Do not eat 1 hour after using.   She is continuing with this intervention.  Pain contract in place with [facility].  DO NOT RESUSCITATE.  DNR form filled out and provided to the patient on 10/24/2014.  Xgeva due today.  Continue Dexamethasone Swish and Swallow.  Refill on Oxycodone provided today.  Labs today demonstrate stable labs with a glucose of 276.  She is a candidate for insulin today and she declines insulin today because "I'm not ready to go eat yet and I don't want my glucose to drop too low."  She admits to a change in taste and decreased appetite.  Her weight is down by 2 lbs over the past month.  She weighs in today at 282.3 lbs compared to 284.5 lbs on 11/24/2014.  CA27.29 and CA15-3 ordered and drawn today.  Based upon results, may need to consider re-imaging in near future.  Return in 2 weeks for follow-up and labs.  Sliding Scale: 151-200: 2 units 201-250: 4 units 251- 300: 6 units Greater than 300: 8 units and call MD.

## 2014-12-19 NOTE — Progress Notes (Signed)
LABS DRAWN

## 2014-12-19 NOTE — Progress Notes (Signed)
Kaylee Mitchell presents today for injection per MD orders. Xgeva 120 mg administered SQ in left Abdomen. Administration without incident. Patient tolerated well.  

## 2014-12-19 NOTE — Patient Instructions (Signed)
[place] at [facility] Discharge Instructions  RECOMMENDATIONS MADE BY THE CONSULTANT AND ANY TEST RESULTS WILL BE SENT TO YOUR REFERRING PHYSICIAN.  Xgeva 120 mg injection given today as ordered, Return as scheduled.  Thank you for choosing [place] at [facility] to provide your oncology and hematology care.  To afford each patient quality time with our provider, please arrive at least 15 minutes before your scheduled appointment time.    You need to re-schedule your appointment should you arrive 10 or more minutes late.  We strive to give you quality time with our providers, and arriving late affects you and other patients whose appointments are after yours.  Also, if you no show three or more times for appointments you may be dismissed from the clinic at the providers discretion.     Again, thank you for choosing [facility]  Our hope is that these requests will decrease the amount of time that you wait before being seen by our physicians.       _____________________________________________________________  Should you have questions after your visit to [facility], please contact our office at [phone] between the hours of 8:30 a.m. and 4:30 p.m.  Voicemails left after 4:30 p.m. will not be returned until the following business day.  For prescription refill requests, have your pharmacy contact our office.

## 2014-12-20 LAB — CANCER ANTIGEN 27.29: CA 27.29: 57.6 U/mL — ABNORMAL HIGH (ref 0.0–38.6)

## 2014-12-22 DIAGNOSIS — I10 Essential (primary) hypertension: Secondary | ICD-10-CM | POA: Diagnosis not present

## 2014-12-22 DIAGNOSIS — E1042 Type 1 diabetes mellitus with diabetic polyneuropathy: Secondary | ICD-10-CM | POA: Diagnosis not present

## 2014-12-22 DIAGNOSIS — E785 Hyperlipidemia, unspecified: Secondary | ICD-10-CM | POA: Diagnosis not present

## 2015-01-02 ENCOUNTER — Encounter: Payer: Self-pay | Admitting: Dietician

## 2015-01-02 ENCOUNTER — Encounter (HOSPITAL_COMMUNITY): Payer: Medicare Other

## 2015-01-02 ENCOUNTER — Encounter (HOSPITAL_COMMUNITY): Payer: Self-pay | Admitting: Oncology

## 2015-01-02 ENCOUNTER — Encounter (HOSPITAL_BASED_OUTPATIENT_CLINIC_OR_DEPARTMENT_OTHER): Payer: Medicare Other | Admitting: Oncology

## 2015-01-02 VITALS — BP 153/81 | HR 103 | Temp 98.4°F | Resp 18 | Wt 279.9 lb

## 2015-01-02 DIAGNOSIS — C50919 Malignant neoplasm of unspecified site of unspecified female breast: Secondary | ICD-10-CM | POA: Diagnosis not present

## 2015-01-02 DIAGNOSIS — R634 Abnormal weight loss: Secondary | ICD-10-CM | POA: Diagnosis not present

## 2015-01-02 DIAGNOSIS — R630 Anorexia: Secondary | ICD-10-CM | POA: Diagnosis not present

## 2015-01-02 DIAGNOSIS — C50911 Malignant neoplasm of unspecified site of right female breast: Secondary | ICD-10-CM

## 2015-01-02 DIAGNOSIS — Z66 Do not resuscitate: Secondary | ICD-10-CM

## 2015-01-02 DIAGNOSIS — K769 Liver disease, unspecified: Secondary | ICD-10-CM

## 2015-01-02 DIAGNOSIS — C7951 Secondary malignant neoplasm of bone: Secondary | ICD-10-CM | POA: Diagnosis not present

## 2015-01-02 DIAGNOSIS — C787 Secondary malignant neoplasm of liver and intrahepatic bile duct: Secondary | ICD-10-CM | POA: Diagnosis not present

## 2015-01-02 LAB — COMPREHENSIVE METABOLIC PANEL
ALBUMIN: 3.6 g/dL (ref 3.5–5.0)
ALT: 30 U/L (ref 14–54)
ANION GAP: 8 (ref 5–15)
AST: 33 U/L (ref 15–41)
Alkaline Phosphatase: 67 U/L (ref 38–126)
BILIRUBIN TOTAL: 0.5 mg/dL (ref 0.3–1.2)
BUN: 24 mg/dL — ABNORMAL HIGH (ref 6–20)
CHLORIDE: 105 mmol/L (ref 101–111)
CO2: 26 mmol/L (ref 22–32)
Calcium: 9.4 mg/dL (ref 8.9–10.3)
Creatinine, Ser: 1.24 mg/dL — ABNORMAL HIGH (ref 0.44–1.00)
GFR calc Af Amer: 53 mL/min — ABNORMAL LOW (ref >60)
GFR, EST NON AFRICAN AMERICAN: 46 mL/min — AB (ref >60)
GLUCOSE: 172 mg/dL — AB (ref 65–99)
POTASSIUM: 3.6 mmol/L (ref 3.5–5.1)
Sodium: 139 mmol/L (ref 135–145)
TOTAL PROTEIN: 7.1 g/dL (ref 6.5–8.1)

## 2015-01-02 LAB — CBC WITH DIFFERENTIAL/PLATELET
BASOS ABS: 0 10*3/uL (ref 0.0–0.1)
BASOS PCT: 1 %
EOS ABS: 0.2 10*3/uL (ref 0.0–0.7)
EOS PCT: 4 %
HEMATOCRIT: 41.8 % (ref 36.0–46.0)
Hemoglobin: 13.5 g/dL (ref 12.0–15.0)
LYMPHS PCT: 46 %
Lymphs Abs: 2.2 10*3/uL (ref 0.7–4.0)
MCH: 27.2 pg (ref 26.0–34.0)
MCHC: 32.3 g/dL (ref 30.0–36.0)
MCV: 84.1 fL (ref 78.0–100.0)
MONO ABS: 0.5 10*3/uL (ref 0.1–1.0)
MONOS PCT: 10 %
Neutro Abs: 1.9 10*3/uL (ref 1.7–7.7)
Neutrophils Relative %: 39 %
PLATELETS: 182 10*3/uL (ref 150–400)
RBC: 4.97 MIL/uL (ref 3.87–5.11)
RDW: 13.7 % (ref 11.5–15.5)
WBC: 4.8 10*3/uL (ref 4.0–10.5)

## 2015-01-02 NOTE — Patient Instructions (Signed)
[place] at [facility] Discharge Instructions  RECOMMENDATIONS MADE BY THE CONSULTANT AND ANY TEST RESULTS WILL BE SENT TO YOUR REFERRING PHYSICIAN.  Exam and discussion by Robynn Pane, PA-C Will have our dietitian see you today Will get you scheduled for PET before 01/16/15 - see instructions for [facility].  If your blood sugar is greater than 200 they will not do the test. Call with any concerns   Follow-up in 2 weeks for labs, xgeva and office visit.  Thank you for choosing [place] at [facility] to provide your oncology and hematology care.  To afford each patient quality time with our provider, please arrive at least 15 minutes before your scheduled appointment time.    You need to re-schedule your appointment should you arrive 10 or more minutes late.  We strive to give you quality time with our providers, and arriving late affects you and other patients whose appointments are after yours.  Also, if you no show three or more times for appointments you may be dismissed from the clinic at the providers discretion.     Again, thank you for choosing [facility].  Our hope is that these requests will decrease the amount of time that you wait before being seen by our physicians.       _____________________________________________________________  Should you have questions after your visit to [facility], please contact our office at [phone] between the hours of 8:30 a.m. and 4:30 p.m.  Voicemails left after 4:30 p.m. will not be returned until the following business day.  For prescription refill requests, have your pharmacy contact our office.

## 2015-01-02 NOTE — Progress Notes (Signed)
[name], MD [address] [place] [address]  Invasive ductal carcinoma of breast, right ([place]) - Plan: CBC with Differential, Comprehensive metabolic panel, Cancer antigen 27.29, Cancer antigen 15-3, NM PET Image Restage (PS) Whole Body  Bone metastasis ([facility]) - Plan: CBC with Differential, Comprehensive metabolic panel, Cancer antigen 27.29, Cancer antigen 15-3, NM PET Image Restage (PS) Whole Body  Lesion of liver - Plan: CBC with Differential, Comprehensive metabolic panel, Cancer antigen 27.29, Cancer antigen 15-3, NM PET Image Restage (PS) Whole Body  CURRENT THERAPY: Afinitor (7.5 mg) + Exemestane.  Xgeva monthly.  INTERVAL HISTORY: Kaylee Mitchell 59 y.o. female returns for followup of Stage IV breast cancer, ER/PR+, HER2 neg, with new hepatic metastasis (biopsy proven).    Invasive ductal carcinoma of breast ([place])   05/26/2009 Initial Diagnosis Needle core biopsy showed invasive mammary carcinoma with 1 + lymph node   06/03/2009 Surgery Right modified radical mastectomy with 8/8 + lymph nodes by Dr. Ronnell Freshwater   07/22/2009 Cancer Staging Bone scan + for multiple osseous lesions.  No disease elsewhere.   07/24/2009 - 03/23/2010 Chemotherapy Arimidex   03/23/2010 Progression    03/23/2010 - 12/05/2012 Chemotherapy Tamoxifen   11/28/2012 Progression Worsening of lumbar spine osseous disease   12/06/2012 - 09/25/2014 Chemotherapy Fulvestrant + Exemestane   01/24/2014 PET scan No evidence of hypermetabolic recurrent or metastatic disease. Widespread osseous metastasis, without significant marrow hypermetabolism.   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   10/20/2014 - 11/13/2014 Chemotherapy Afinitor + Exemestane   11/13/2014 Adverse Reaction Increasing fatigue   11/13/2014 Treatment Plan Change Hold Afinitor, will reduce dose to 7.5 mg daily.  Continue Exemestane and Xgeva.   11/24/2014 -   Chemotherapy Afinitor 7.5 mg daily, Exemestane daily, and continuation of Xgeva.   I personally reviewed and went over laboratory results with the patient.  The results are noted within this dictation.    She notes that she feels good.  She denies any complaints other than the taste of foods resulting in decreased appetite and weight loss.    Her weight is down to 279.9 lbs.  She has lost about 20 lbs since beginning this treatment.  I will ask nutrition to see her today for further recommendations.  She otherwise denies any complaints.  Past Medical History  Diagnosis Date  . Hypertension   . High cholesterol   . Knee pain, bilateral 2013  . Cancer ([place])     rt breast is primary  . Bone metastasis ([place]) 09/13/2010  . Diabetes mellitus     type II  . Depression   . Depression 12/14/2011  . Invasive ductal carcinoma of breast ([place]) 09/13/2010  . Opioid contract exists with [facility] 10/24/2014    Contract found in Chart review in letter tab.  . 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; and DNR (do not resuscitate) on her problem list.     has No Known Allergies.  Current Outpatient Prescriptions on File Prior to Visit  Medication Sig Dispense Refill  .  aspirin EC 81 MG tablet Take 81 mg by mouth daily.      Marland Kitchen atorvastatin (LIPITOR) 20 MG tablet TAKE ONE TABLET BY MOUTH ONCE DAILY 30 tablet 0  . calcium-vitamin D (OSCAL WITH D) 500-200 MG-UNIT per tablet Take 1 tablet by mouth 2 (two) times daily.     . carvedilol (COREG)  12.5 MG tablet TAKE ONE TABLET BY MOUTH TWICE DAILY WITH MEALS 180 tablet 0  . cyclobenzaprine (FLEXERIL) 10 MG tablet Take 1 tablet (10 mg total) by mouth 3 (three) times daily as needed for muscle spasms. 10 tablet 0  . diphenhydrAMINE (BENADRYL) 25 MG tablet Take 25 mg by mouth at bedtime as needed.    . docusate sodium (STOOL SOFTENER) 100 MG capsule Take 100 mg by mouth daily as needed for mild constipation.     Marland Kitchen everolimus (AFINITOR) 7.5 MG tablet Take 1 tablet (7.5 mg total) by mouth daily. 28 tablet 0  . exemestane (AROMASIN) 25 MG tablet Take 1 tablet (25 mg total) by mouth daily after breakfast. 90 tablet 3  . gabapentin (NEURONTIN) 300 MG capsule Take 300 mg by mouth as needed.   3  . Insulin Glargine (LANTUS) 100 UNIT/ML Solostar Pen Inject 60 Units into the skin at bedtime.    Marland Kitchen LORazepam (ATIVAN PO) Take 1 tablet by mouth daily as needed (anxiety).     Marland Kitchen losartan-hydrochlorothiazide (HYZAAR) 50-12.5 MG per tablet Take 1 tablet by mouth daily.    . metFORMIN (GLUCOPHAGE) 500 MG tablet Take 500 mg by mouth 2 (two) times daily with a meal.    . ondansetron (ZOFRAN) 8 MG tablet Take 1 tablet (8 mg total) by mouth every 8 (eight) hours as needed for nausea or vomiting. 30 tablet 3  . Oxycodone HCl 10 MG TABS Take 1 tablet every 4 hours as needed for pain. 100 tablet 0  . Polyethylene Glycol 3350 (MIRALAX PO) Take 17 g by mouth daily as needed (constipation).     . potassium chloride SA (K-DUR,KLOR-CON) 20 MEQ tablet Take 1 tablet (20 mEq total) by mouth daily. 90 tablet 4  . sertraline (ZOLOFT) 100 MG tablet Take 100 mg by mouth daily.    . VENTOLIN HFA 108 (90 BASE) MCG/ACT inhaler Inhale 2 puffs into the lungs 4 (four) times daily as needed for wheezing or shortness of breath.   0  . dexamethasone (DECADRON) 0.5 MG/5ML solution Take 10 mLs (1 mg total) by mouth 4 (four) times daily. (Patient not taking: Reported on 12/05/2014) 560 mL 4  . omeprazole (PRILOSEC) 40 MG capsule TAKE 1  CAPSULE (40 MG TOTAL) BY MOUTH DAILY. (Patient not taking: Reported on 12/05/2014) 30 capsule 3  . venlafaxine XR (EFFEXOR XR) 150 MG 24 hr capsule Take take one capsule at bedtime nightly to control hot flashes. (Patient not taking: Reported on 01/02/2015) 60 capsule 6  . [DISCONTINUED] loratadine (CLARITIN) 10 MG tablet Take 10 mg by mouth daily as needed. For allergies     Current Facility-Administered Medications on File Prior to Visit  Medication Dose Route Frequency Provider Last Rate Last Dose  . 1 mL EPINEPHrine 1 mg/mL (1:1000) in 0.9% Normal Saline 3000 mL irrigation    PRN Carole Civil, MD        Past Surgical History  Procedure Laterality Date  . Knee surgery      right-arthroscopy  . Mastectomy      right side  . Abdominal hysterectomy      Denies any headaches,  double vision, fevers, chills, night sweats, vomiting, diarrhea, constipation, chest pain, heart palpitations, shortness of breath, blood in stool, black tarry stool, urinary pain, urinary burning, urinary frequency, hematuria.   PHYSICAL EXAMINATION  ECOG PERFORMANCE STATUS: 1 - Symptomatic but completely ambulatory  Filed Vitals:   01/02/15 0903  BP: 153/81  Pulse: 103  Temp: 98.4 F (36.9 C)  Resp: 18    GENERAL:alert, no distress, well developed, comfortable, cooperative, obese, smiling, unaccompanied SKIN: skin color, texture, turgor are normal, no rashes or significant lesions HEAD: Normocephalic, No masses, lesions, tenderness or abnormalities EYES: normal, PERRLA EARS: External ears normal OROPHARYNX:no exudate, no erythema, lips, buccal mucosa, and tongue normal and mucous membranes are moist.  No sores noted on exam. NECK: supple, trachea midline LYMPH:  No neck adenopathy BREAST:not examined LUNGS: CTA B/L without wheezes, rales, and rhonchi HEART: RRR without murmur, rub or gallop.  Normal S1 and S2. ABDOMEN:abdomen soft, non-tender and obese BACK: Back symmetric, no  curvature. EXTREMITIES:less then 2 second capillary refill, no joint deformities, effusion, or inflammation, no skin discoloration, no cyanosis  NEURO: alert & oriented x 3 with fluent speech, no focal motor/sensory deficits, gait normal   LABORATORY DATA: CBC    Component Value Date/Time   WBC 4.8 01/02/2015 0855   RBC 4.97 01/02/2015 0855   HGB 13.5 01/02/2015 0855   HCT 41.8 01/02/2015 0855   PLT 182 01/02/2015 0855   MCV 84.1 01/02/2015 0855   MCH 27.2 01/02/2015 0855   MCHC 32.3 01/02/2015 0855   RDW 13.7 01/02/2015 0855   LYMPHSABS 2.2 01/02/2015 0855   MONOABS 0.5 01/02/2015 0855   EOSABS 0.2 01/02/2015 0855   BASOSABS 0.0 01/02/2015 0855      Chemistry      Component Value Date/Time   NA 139 01/02/2015 0855   K 3.6 01/02/2015 0855   CL 105 01/02/2015 0855   CO2 26 01/02/2015 0855   BUN 24* 01/02/2015 0855   CREATININE 1.24* 01/02/2015 0855      Component Value Date/Time   CALCIUM 9.4 01/02/2015 0855   ALKPHOS 67 01/02/2015 0855   AST 33 01/02/2015 0855   ALT 30 01/02/2015 0855   BILITOT 0.5 01/02/2015 0855     Lab Results  Component Value Date   LABCA2 57.6* 12/19/2014      PENDING LABS:   RADIOGRAPHIC STUDIES:  No results found.   PATHOLOGY: As previously noted    ASSESSMENT AND PLAN:  Invasive ductal carcinoma of breast Stage IV Breast cancer with bone involvement requiring Xgeva.  Previously on Faslodex and Aromasin until imaging demonstrated a new hepatic lesion resulting in biopsy proven progression of disease.  Now on Aromasin + Afinitor (7.5 mg beginning on 11/24/2014, reduced from 10 mg).  Oncology history is up-to-date.  Afinitor dose decreased to 7.5 mg with continuation of all other treatments beginning on 11/24/2014.    Based upon results from the SWISH trial, I will prescribe Dexamethasone mouthwash.  SWISH trial demonstrated a significant reduction in grade 2 or higher stomatitis with Dexamethsone mouthwash on evaluation on week  8 of therapy with a reported incidence of grade 2 or greater stomatitis of 2.4% (compared to 33% in BOLERA-2 trial).  Rx for Dexamethasone provided on 10/24/2014: 0.68m/5mL, swish for 2 minutes and spit QID x 8 weeks (or more).  Do not eat 1 hour after using.   She is continuing with this intervention.  Continue Dexamethasone Swish and Swallow.  [facility]  DO NOT RESUSCITATE.  DNR form filled out and provided to the patient on 10/24/2014.  She admits to a change in taste and decreased appetite as a result.  She denies any nausea/vomiting.  Her weight is down about 20 lbs since starting therapy.  I have contacted Burtis Junes and he will see her today in the clinic.  She weighs 279.9 lbs today.  We will continue with the same dose of Afinitor at this time.    Her CA 27.29 and CA15-3 have come down, but not significantly.  She is 3 months out from her last imaging and therefore, it is time to restage her and evaluate for response to therapy.  I have placed an order for a PET scan.  Order is placed for PET scan for restaging.  Hope to have this done before her follow-up appointment on 01/16/2015.  Sliding Scale: 151-200: 2 units 201-250: 4 units 251- 300: 6 units Greater than 300: 8 units and call MD.   THERAPY PLAN:  Continue Exemestane daily. Will be mindful of Afinitor side effects including, but not limited to, stomatitis and pneumonitis.    All questions were answered. The patient knows to call the clinic with any problems, questions or concerns. We can certainly see the patient much sooner if necessary.  Patient and plan discussed with Dr. Ancil Linsey and she is in agreement with the aforementioned.   This note is electronically signed by: Doy Mince 01/02/2015 12:58 PM

## 2015-01-02 NOTE — Progress Notes (Signed)
Patient identified to be at risk for malnutrition on the MST secondary to Wt loss and reported poor PO intake  Contacted Pt by visiting at scheduled appointment   Wt Readings from Last 10 Encounters:  01/02/15 279 lb 14.4 oz (126.962 kg)  12/19/14 282 lb 4.8 oz (128.05 kg)  12/05/14 283 lb 6.4 oz (128.549 kg)  11/24/14 284 lb 8 oz (129.048 kg)  11/13/14 284 lb 11.2 oz (129.139 kg)  11/06/14 288 lb 4.8 oz (130.772 kg)  10/24/14 294 lb 8 oz (133.584 kg)  10/09/14 295 lb (133.811 kg)  10/05/14 297 lb (134.718 kg)  09/30/14 297 lb (134.718 kg)   Patient weight has decreased by about 20 lbs in the last 3 months, 10 lbs in 2 months and 5 lbs in 1 month.   Patient reports oral intake as poor and is suffering from severe taste changes.  She states that since starting Afinitor, she has been suffering from taste changes, "Everything tastes bitter". She says that she can initiate her meals without any changes, however, as the meal progresses everything slowly starts to taste awful. As a result, she will typically get through half a meal before she can't eat anymore. She says she mostly eats chicken.  Discussed with pt that taste changes are extremely common and every patient will find a different way that is best at combating them.  Gave some recommendations such as switching from metal to plastic utensils, cleaning mouth with solution/recipe that was given, eating food cold, marinating meats, sucking on mints/gum in between meals, shopping/trying different spices/seasonging, adding sugar/sweetener to foods and  in general trying to eat sweeter foods ie fruits.  Pt seemed was receptive to recommendations.   Left handout titled "Taste and Smell Changes".  Will continue to follow from periphery.   Burtis Junes RD, LDN Nutrition Pager: [phone] 01/02/2015 10:28 AM

## 2015-01-02 NOTE — Assessment & Plan Note (Addendum)
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  Previously on Faslodex and Aromasin until imaging demonstrated a new hepatic lesion resulting in biopsy proven progression of disease.  Now on Aromasin + Afinitor (7.5 mg beginning on 11/24/2014, reduced from 10 mg).  Oncology history is up-to-date.  Afinitor dose decreased to 7.5 mg with continuation of all other treatments beginning on 11/24/2014.    Based upon results from the SWISH trial, I will prescribe Dexamethasone mouthwash.  SWISH trial demonstrated a significant reduction in grade 2 or higher stomatitis with Dexamethsone mouthwash on evaluation on week 8 of therapy with a reported incidence of grade 2 or greater stomatitis of 2.4% (compared to 33% in BOLERA-2 trial).  Rx for Dexamethasone provided on 10/24/2014: 0.5mg /43mL, swish for 2 minutes and spit QID x 8 weeks (or more).  Do not eat 1 hour after using.   She is continuing with this intervention.  Continue Dexamethasone Swish and Swallow.  Pain contract in place with [facility].  DO NOT RESUSCITATE.  DNR form filled out and provided to the patient on 10/24/2014.  She admits to a change in taste and decreased appetite as a result.  She denies any nausea/vomiting.  Her weight is down about 20 lbs since starting therapy.  I have contacted Burtis Junes and he will see her today in the clinic.  She weighs 279.9 lbs today.  We will continue with the same dose of Afinitor at this time.    Her CA 27.29 and CA15-3 have come down, but not significantly.  She is 3 months out from her last imaging and therefore, it is time to restage her and evaluate for response to therapy.  I have placed an order for a PET scan.  Order is placed for PET scan for restaging.  Hope to have this done before her follow-up appointment on 01/16/2015.  Sliding Scale: 151-200: 2 units 201-250: 4 units 251- 300: 6 units Greater than 300: 8 units and call MD.

## 2015-01-16 ENCOUNTER — Encounter (HOSPITAL_COMMUNITY): Payer: Medicare Other

## 2015-01-16 ENCOUNTER — Encounter (HOSPITAL_COMMUNITY): Payer: Self-pay | Admitting: Hematology & Oncology

## 2015-01-16 ENCOUNTER — Encounter (HOSPITAL_BASED_OUTPATIENT_CLINIC_OR_DEPARTMENT_OTHER): Payer: Medicare Other | Admitting: Hematology & Oncology

## 2015-01-16 ENCOUNTER — Encounter (HOSPITAL_BASED_OUTPATIENT_CLINIC_OR_DEPARTMENT_OTHER): Payer: Medicare Other

## 2015-01-16 VITALS — BP 154/81 | HR 94 | Temp 98.2°F | Resp 18 | Wt 277.7 lb

## 2015-01-16 DIAGNOSIS — M79606 Pain in leg, unspecified: Secondary | ICD-10-CM

## 2015-01-16 DIAGNOSIS — R110 Nausea: Secondary | ICD-10-CM

## 2015-01-16 DIAGNOSIS — C7951 Secondary malignant neoplasm of bone: Secondary | ICD-10-CM

## 2015-01-16 DIAGNOSIS — C787 Secondary malignant neoplasm of liver and intrahepatic bile duct: Secondary | ICD-10-CM | POA: Diagnosis not present

## 2015-01-16 DIAGNOSIS — C50919 Malignant neoplasm of unspecified site of unspecified female breast: Secondary | ICD-10-CM

## 2015-01-16 DIAGNOSIS — G8929 Other chronic pain: Secondary | ICD-10-CM | POA: Diagnosis not present

## 2015-01-16 LAB — CBC WITH DIFFERENTIAL/PLATELET
BASOS ABS: 0 10*3/uL (ref 0.0–0.1)
Basophils Relative: 0 %
EOS PCT: 4 %
Eosinophils Absolute: 0.2 10*3/uL (ref 0.0–0.7)
HEMATOCRIT: 42.3 % (ref 36.0–46.0)
Hemoglobin: 13.6 g/dL (ref 12.0–15.0)
LYMPHS ABS: 2.4 10*3/uL (ref 0.7–4.0)
LYMPHS PCT: 52 %
MCH: 27.1 pg (ref 26.0–34.0)
MCHC: 32.2 g/dL (ref 30.0–36.0)
MCV: 84.3 fL (ref 78.0–100.0)
Monocytes Absolute: 0.4 10*3/uL (ref 0.1–1.0)
Monocytes Relative: 8 %
NEUTROS ABS: 1.7 10*3/uL (ref 1.7–7.7)
Neutrophils Relative %: 36 %
PLATELETS: 177 10*3/uL (ref 150–400)
RBC: 5.02 MIL/uL (ref 3.87–5.11)
RDW: 13.6 % (ref 11.5–15.5)
WBC: 4.8 10*3/uL (ref 4.0–10.5)

## 2015-01-16 LAB — COMPREHENSIVE METABOLIC PANEL
ALK PHOS: 70 U/L (ref 38–126)
ALT: 23 U/L (ref 14–54)
AST: 27 U/L (ref 15–41)
Albumin: 3.7 g/dL (ref 3.5–5.0)
Anion gap: 6 (ref 5–15)
BILIRUBIN TOTAL: 0.7 mg/dL (ref 0.3–1.2)
BUN: 16 mg/dL (ref 6–20)
CALCIUM: 9.5 mg/dL (ref 8.9–10.3)
CHLORIDE: 106 mmol/L (ref 101–111)
CO2: 27 mmol/L (ref 22–32)
CREATININE: 1.26 mg/dL — AB (ref 0.44–1.00)
GFR, EST AFRICAN AMERICAN: 52 mL/min — AB (ref >60)
GFR, EST NON AFRICAN AMERICAN: 45 mL/min — AB (ref >60)
Glucose, Bld: 152 mg/dL — ABNORMAL HIGH (ref 65–99)
Potassium: 3.8 mmol/L (ref 3.5–5.1)
Sodium: 139 mmol/L (ref 135–145)
TOTAL PROTEIN: 7 g/dL (ref 6.5–8.1)

## 2015-01-16 MED ORDER — OXYCODONE HCL 10 MG PO TABS: ORAL_TABLET | ORAL | Status: DC

## 2015-01-16 MED ORDER — DENOSUMAB 120 MG/1.7ML ~~LOC~~ SOLN
120.0000 mg | Freq: Once | SUBCUTANEOUS | Status: AC
  Administered 2015-01-16: 120 mg via SUBCUTANEOUS
  Filled 2015-01-16: qty 1.7

## 2015-01-16 NOTE — Progress Notes (Signed)
Kaylee Mitchell presents today for injection per MD orders. Xgeva 120mg  administered SQ in left Abdomen. Administration without incident. Patient tolerated well.   Please see MD encounter for this date for other documentation.

## 2015-01-16 NOTE — Progress Notes (Signed)
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 7 cm primary tumor, extensive LVI. With focal extenion to the anterior surface margin, 8 of 8 lymph nodes were involved with metastatic disease.    ER positive 100%, PR positive 99%, HER2 neu negative, Ki-67 marker 14%.   Mastectomy followed by radiation therapy to T4 through T7 due to severe involvement at this area.  Arimidex initially from 07/2009 through 03/23/2010 and was progressing by bone scan criteria. We switched her to tamoxifen.   Because of worsening bone pain, MRI of lumbar spine completed on 11/29/2012 demonstrated progressive diffuse lumbar spine metastatic disease and therefore therapy was changed to Fulvestrant and Exemestane on 12/11/2012, tolerating well except for vasomotor instability    Invasive ductal carcinoma of breast ([place])   05/26/2009 Initial Diagnosis Needle core biopsy showed invasive mammary carcinoma with 1 + lymph node   06/03/2009 Surgery Right modified radical mastectomy with 8/8 + lymph nodes by Dr. Ronnell Freshwater   07/22/2009 Cancer Staging Bone scan + for multiple osseous lesions.  No disease elsewhere.   07/24/2009 - 03/23/2010 Chemotherapy Arimidex   03/23/2010 Progression    03/23/2010 - 12/05/2012 Chemotherapy Tamoxifen   11/28/2012 Progression Worsening of lumbar spine osseous disease   12/06/2012 - 09/25/2014 Chemotherapy Fulvestrant + Exemestane   01/24/2014 PET scan No evidence of hypermetabolic recurrent or metastatic disease. Widespread osseous metastasis, without significant marrow hypermetabolism.   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   10/20/2014 - 11/13/2014 Chemotherapy Afinitor + Exemestane   11/13/2014 Adverse Reaction Increasing fatigue   11/13/2014 Treatment Plan Change Hold Afinitor, will reduce  dose to 7.5 mg daily.  Continue Exemestane and Xgeva.   11/24/2014 -  Chemotherapy Afinitor 7.5 mg daily, Exemestane daily, and continuation of Xgeva.    CURRENT THERAPY:Afinitor/Exemestane/Xgeva  INTERVAL HISTORY: Kaylee Mitchell 59 y.o. female returns for follow-up of her breast cancer, stage IV disease. She unfortunately has had a rise in tumor markers and repeat imaging showed new hepatic metastases.  Kaylee Mitchell is here alone and has received her XGEVA shot today. States that she "still has  no taste buds". I have advised for her to discontinue the Afinitor until she receives her PET scan. She missed her previous PET scan appointment because she woke up that morning and felt very hot and nauseated from her medication. . Reports she does not eat much. She has nausea medication previously prescribed by Kirby Crigler that manages her nausea. She has lost about 20 lbs. since September.  The patient has not received a flu shot this year and states that she does not want one.  She denies sore hands or feet. No mouth sores. No diarrhea or constipation.   MEDICAL HISTORY: Past Medical History  Diagnosis Date  . Hypertension   . High cholesterol   . Knee pain, bilateral 2013  . Cancer ([place])     rt breast is primary  . Bone metastasis ([place]) 09/13/2010  . Diabetes mellitus     type II  . Depression   . Depression 12/14/2011  . Invasive ductal carcinoma of breast ([place]) 09/13/2010  . Opioid contract exists with [facility] 10/24/2014    Contract found in Chart review in letter tab.  . 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     has No Known Allergies.  We administered fulvestrant and denosumab.  SURGICAL HISTORY: Past Surgical History  Procedure Laterality Date  . Knee surgery      right-arthroscopy  . Mastectomy      right side  . Abdominal hysterectomy      SOCIAL HISTORY: Social History   Social History  . Marital Status: Single    Spouse Name: N/A  . Number of Children: N/A  . Years of Education: 12   Occupational History  . disabled    Social History Main Topics  . Smoking status: Never Smoker   . Smokeless tobacco: Never Used  . Alcohol Use: No  . Drug Use: No  . Sexual Activity: Yes    Birth Control/ Protection: Surgical   Other Topics Concern  . Not on file   Social History Narrative    FAMILY HISTORY: Family History  Problem Relation Age of Onset  . Arthritis    . Cancer    . Diabetes    . Anesthesia problems Neg Hx   . Hypotension Neg Hx   . Malignant hyperthermia Neg Hx   . Pseudochol deficiency Neg Hx   . Diabetes Mother   . Diabetes Father   . Cancer Maternal Aunt     Review of Systems  Constitutional:  Positive for malaise/fatigue, weight loss, and loss of appetite. Negative for fever, chills and diaphoresis.      Loss of 20 lbs since September. HENT: Negative for congestion, ear discharge, ear pain, hearing loss, nosebleeds, sore throat and tinnitus.   Eyes: Negative.   Respiratory: Negative.  Negative for stridor.   Cardiovascular: Negative.   Gastrointestinal: Negative. Genitourinary: Negative.   Musculoskeletal: Negative. Skin: Negative.   Neurological: Negative for dizziness, tingling, tremors, sensory change, speech change, focal weakness, seizures, headaches, loss of consciousness and weakness.  Endo/Heme/Allergies: Negative.   Psychiatric/Behavioral: Negative.   14 point review of systems was performed and  is negative except as detailed under history of present illness and above   PHYSICAL EXAMINATION  ECOG PERFORMANCE STATUS: 1 - Symptomatic but completely ambulatory  Filed Vitals:   01/16/15 1000  BP: 154/81  Pulse: 94  Temp: 98.2 F (36.8 C)  Resp: 18    Physical Exam  Constitutional: She is oriented to person, place, and time and well-developed, well-nourished, and in no distress.  Obese. Weight loss of 20 lbs since September. HENT:  Head: Normocephalic and atraumatic.  Nose: Nose normal.  Mouth/Throat: Oropharynx is clear and moist. No oropharyngeal exudate.  Eyes: Conjunctivae and EOM are normal. Pupils are equal, round, and reactive to light. Right eye exhibits no discharge. Left eye exhibits no discharge. No scleral icterus.  Neck: Normal range of motion. Neck supple. No tracheal deviation present. No thyromegaly present.  Cardiovascular: Normal rate, regular rhythm and normal heart sounds.  Exam reveals no gallop and no friction rub.   No murmur heard. Pulmonary/Chest: Effort normal and breath sounds normal. She has no wheezes. She has no rales.  Left chest wall with prior mastectomy, incision intact, no nodularity or concerning skin changes  Abdominal: Soft.  Bowel sounds are normal. She exhibits no distension and no mass. There is no tenderness. There is no rebound and no guarding. Biopsy site is barely noticeable Musculoskeletal: Normal range of motion. She exhibits no edema.  Lymphadenopathy:    She has no cervical adenopathy.  Neurological: She is alert and oriented to person, place, and time. She has normal reflexes. No cranial nerve deficit. Gait normal. Coordination normal.  Skin: Skin is warm and dry. No rash noted.  Psychiatric: Mood, memory, affect and judgment normal.  Nursing note and vitals reviewed.   LABORATORY DATA: I have reviewed the results below as listed.    INDICATION: History of breast cancer, now with new hypermetabolic liver  lesions worrisome for metastatic disease. Please perform CT-guided biopsy for tissue diagnostic purposes  EXAM: CT-GUIDED BIOPSY OF DOMINANT INDETERMINATE MASS WITHIN ANTERIOR SEGMENT OF THE RIGHT LOBE OF THE LIVER  COMPARISON: PET-CT - 09/18/2014; 01/24/2014  MEDICATIONS: Fentanyl 100 mcg IV; Versed 2 mg IV  ANESTHESIA/SEDATION: Sedation time  12 minutes  CONTRAST: None  COMPLICATIONS: None immediate  PROCEDURE: Informed consent was obtained from the patient following an explanation of the procedure, risks, benefits and alternatives. A time out was performed prior to the initiation of the procedure.  The patient was positioned supine on the CT table and a limited CT was performed for procedural planning demonstrating the dominant ill-defined hypo attenuating mass within the anterior segment of the right lobe of the liver with dominant ill-defined component measuring approximately 7.5 x 6.9 cm (image 30, series 2). The procedure was planned. The operative site was prepped and draped in the usual sterile fashion. Appropriate trajectory was confirmed with a 22 gauge spinal needle after the adjacent tissues were anesthetized with 1% Lidocaine with epinephrine.  Under intermittent CT guidance, a 17 gauge coaxial needle was advanced into the peripheral aspect of the mass. Appropriate positioning was confirmed and 5 core needle biopsy samples were obtained with an 18 gauge core needle biopsy device. The co-axial needle was removed and hemostasis was achieved with manual compression.  A limited postprocedural CT was negative for hemorrhage or additional complication. A dressing was placed. The patient tolerated the procedure well without immediate postprocedural complication.  IMPRESSION: Technically successful CT guided core needle biopsy of ill-defined dominant mass within the anterior segment of the right lobe of the liver.   Electronically  Signed  By: Kaylee Mariscal M.D.  On: 09/30/2014 12:37  PATHOLOGY: PORT OF SURGICAL PATHOLOGY ADDITIONAL INFORMATION: FLUORESCENCE IN-SITU HYBRIDIZATION Results: HER2 - NEGATIVE RATIO OF HER2/CEP17 SIGNALS 1.18 AVERAGE HER2 COPY NUMBER PER CELL 1.95 Reference Range: NEGATIVE HER2/CEP17 Ratio <2.0 and average HER2 copy number <4.0 EQUIVOCAL HER2/CEP17 Ratio <2.0 and average HER2 copy number >=4.0 and <6.0 POSITIVE HER2/CEP17 Ratio >=2.0 or <2.0 and average HER2 copy number >=6.0 Kaylee Cutter MD Pathologist, Electronic Signature ( Signed 10/07/2014) PROGNOSTIC INDICATORS Results: IMMUNOHISTOCHEMICAL AND MORPHOMETRIC ANALYSIS PERFORMED MANUALLY Estrogen Receptor: 100%, POSITIVE, STRONG STAINING INTENSITY Progesterone Receptor: 90%, POSITIVE, STRONG STAINING INTENSITY REFERENCE RANGE ESTROGEN RECEPTOR NEGATIVE 0% POSITIVE =>1% REFERENCE RANGE PROGESTERONE RECEPTOR NEGATIVE 0% POSITIVE =>1% All controls stained appropriately 1 of 3 FINAL for Kaylee Mitchell, Kaylee Mitchell ([id number]) ADDITIONAL INFORMATION:(continued) Kaylee RUND DO Pathologist, Electronic Signature    ASSESSMENT and THERAPY PLAN:  Stage IV ER+ carcinoma of the breast with bone metastases Chronic Leg pain Low calcium on XGEVA Questionable drug diversion Liver metastases ER+ PR+ HER 2 neu -   The patient has been on Arimidex, tamoxifen, and was started on fulvestrant and exemestane in  November 2014. She unfortunately developed liver metastases.We tried her on afinitor/aromasin and due to difficulties with nausea on afinitor, reduced her dose to 7.5 mg daily. She has missed two PET appointments for restaging because of nausea.  I have held her afinitor. We have rescheduled her PET. I emphasized the importance of making the appointment for restaging.  If she has improvement in her disease we can discuss/try other options to improve drug tolerance. I will see her back post PET.  I discussed with the patient end-of-life issues  briefly. She states she does speak to her family occasionally, she notes everyone is aware that she will eventually die from her cancer. We need to address advanced directives and also DO NOT RESUSCITATE status moving forward.  All questions were answered. The patient knows to call the clinic with any problems, questions or concerns. We can certainly see the patient much sooner if necessary.    This document serves as a record of services personally performed by Ancil Linsey, MD. It was created on her behalf by Kaylee Mitchell, a trained medical scribe. The creation of this record is based on the scribe's personal observations and the provider's statements to them. This document has been checked and approved by the attending provider.  I have reviewed the above documentation for accuracy and completeness, and I agree with the above.  This note was electronically signed.  Kelby Fam. Whitney Muse, MD

## 2015-01-16 NOTE — Patient Instructions (Addendum)
[place] at [facility] Discharge Instructions  RECOMMENDATIONS MADE BY THE CONSULTANT AND ANY TEST RESULTS WILL BE SENT TO YOUR REFERRING PHYSICIAN.    Exam completed by Dr Whitney Muse today Jayme Cloud today X-geva every 28 days Hold Chemotherapy pill until after PET scan hold affintor  Return to see the doctor after PET scan We can discuss options for treatment after your PET scan Please call the clinic if you have any questions or concerns    Thank you for choosing [place] at [facility] to provide your oncology and hematology care.  To afford each patient quality time with our provider, please arrive at least 15 minutes before your scheduled appointment time.    You need to re-schedule your appointment should you arrive 10 or more minutes late.  We strive to give you quality time with our providers, and arriving late affects you and other patients whose appointments are after yours.  Also, if you no show three or more times for appointments you may be dismissed from the clinic at the providers discretion.     Again, thank you for choosing [facility].  Our hope is that these requests will decrease the amount of time that you wait before being seen by our physicians.       _____________________________________________________________  Should you have questions after your visit to [facility], please contact our office at [phone] between the hours of 8:30 a.m. and 4:30 p.m.  Voicemails left after 4:30 p.m. will not be returned until the following business day.  For prescription refill requests, have your pharmacy contact our office.

## 2015-01-17 LAB — CANCER ANTIGEN 27.29: CA 27.29: 53 U/mL — ABNORMAL HIGH (ref 0.0–38.6)

## 2015-01-17 LAB — CANCER ANTIGEN 15-3: CANCER ANTIGEN-BREAST 15-3: 33 U/mL — AB (ref <32)

## 2015-01-20 ENCOUNTER — Other Ambulatory Visit (HOSPITAL_COMMUNITY): Payer: Self-pay

## 2015-01-20 ENCOUNTER — Telehealth (HOSPITAL_COMMUNITY): Payer: Self-pay

## 2015-01-20 DIAGNOSIS — R058 Other specified cough: Secondary | ICD-10-CM

## 2015-01-20 DIAGNOSIS — R05 Cough: Secondary | ICD-10-CM

## 2015-01-20 DIAGNOSIS — C50919 Malignant neoplasm of unspecified site of unspecified female breast: Secondary | ICD-10-CM

## 2015-01-20 MED ORDER — AZITHROMYCIN 250 MG PO TABS: ORAL_TABLET | ORAL | Status: DC

## 2015-01-20 NOTE — Telephone Encounter (Signed)
Per Dr. Whitney Muse, patient is to hold Afinitor, Z pak #6 e-scribed to [facility] and to come for chest xray tomorrow.  To call back on Friday if symptoms have not improved.  Verbalized understanding of instructions and that she will come in the am for the Chest xray.

## 2015-01-20 NOTE — Telephone Encounter (Signed)
Call from patient.  Complaining with cough and cold.  Denies fevers, coughing productively white phlegm.  States "Dr. Whitney Muse told me to call if I had any problems so I wanted to find out what she wants me to take."

## 2015-01-22 ENCOUNTER — Ambulatory Visit (HOSPITAL_COMMUNITY)
Admission: RE | Admit: 2015-01-22 | Discharge: 2015-01-22 | Disposition: A | Discharge status: Home / Self Care | Payer: Medicare Other | Source: Ambulatory Visit | Attending: Hematology & Oncology | Admitting: Hematology & Oncology

## 2015-01-22 DIAGNOSIS — R918 Other nonspecific abnormal finding of lung field: Secondary | ICD-10-CM | POA: Insufficient documentation

## 2015-01-22 DIAGNOSIS — R05 Cough: Secondary | ICD-10-CM | POA: Insufficient documentation

## 2015-01-22 DIAGNOSIS — C50919 Malignant neoplasm of unspecified site of unspecified female breast: Secondary | ICD-10-CM

## 2015-01-22 DIAGNOSIS — R52 Pain, unspecified: Secondary | ICD-10-CM | POA: Insufficient documentation

## 2015-01-22 DIAGNOSIS — R058 Other specified cough: Secondary | ICD-10-CM

## 2015-01-23 ENCOUNTER — Ambulatory Visit (HOSPITAL_COMMUNITY): Payer: Medicare Other | Admitting: Oncology

## 2015-01-26 ENCOUNTER — Telehealth (HOSPITAL_COMMUNITY): Payer: Self-pay | Admitting: Emergency Medicine

## 2015-01-26 NOTE — Telephone Encounter (Signed)
Pt called and wanted to know the results of chest x-ray.  Spoke with Dr Whitney Muse, called pt and notified her that it just showed inflammation in the lungs that is more than likely from the chemotherapy pill and we have that pill on hold right now.  Cant really explain the rib pain but emphasized pt on keeping her PET scan appt. Pt verbalized understanding

## 2015-01-28 ENCOUNTER — Ambulatory Visit (HOSPITAL_COMMUNITY)
Admission: RE | Admit: 2015-01-28 | Discharge: 2015-01-28 | Disposition: A | Discharge status: Home / Self Care | Payer: Medicare Other | Source: Ambulatory Visit | Attending: Oncology | Admitting: Oncology

## 2015-01-28 DIAGNOSIS — C7951 Secondary malignant neoplasm of bone: Secondary | ICD-10-CM | POA: Diagnosis not present

## 2015-01-28 DIAGNOSIS — C50911 Malignant neoplasm of unspecified site of right female breast: Secondary | ICD-10-CM

## 2015-01-28 DIAGNOSIS — K769 Liver disease, unspecified: Secondary | ICD-10-CM | POA: Diagnosis not present

## 2015-01-28 LAB — GLUCOSE, CAPILLARY: GLUCOSE-CAPILLARY: 312 mg/dL — AB (ref 65–99)

## 2015-01-30 ENCOUNTER — Ambulatory Visit (HOSPITAL_COMMUNITY)
Admission: RE | Admit: 2015-01-30 | Discharge: 2015-01-30 | Disposition: A | Discharge status: Home / Self Care | Payer: Medicare Other | Source: Ambulatory Visit | Attending: Oncology | Admitting: Oncology

## 2015-01-30 ENCOUNTER — Other Ambulatory Visit (HOSPITAL_COMMUNITY): Payer: Self-pay | Admitting: Oncology

## 2015-01-30 DIAGNOSIS — K5730 Diverticulosis of large intestine without perforation or abscess without bleeding: Secondary | ICD-10-CM | POA: Diagnosis not present

## 2015-01-30 DIAGNOSIS — I2510 Atherosclerotic heart disease of native coronary artery without angina pectoris: Secondary | ICD-10-CM | POA: Diagnosis not present

## 2015-01-30 DIAGNOSIS — Z9011 Acquired absence of right breast and nipple: Secondary | ICD-10-CM | POA: Diagnosis not present

## 2015-01-30 DIAGNOSIS — C787 Secondary malignant neoplasm of liver and intrahepatic bile duct: Secondary | ICD-10-CM | POA: Insufficient documentation

## 2015-01-30 DIAGNOSIS — K769 Liver disease, unspecified: Secondary | ICD-10-CM

## 2015-01-30 DIAGNOSIS — C7951 Secondary malignant neoplasm of bone: Secondary | ICD-10-CM | POA: Diagnosis not present

## 2015-01-30 DIAGNOSIS — C50911 Malignant neoplasm of unspecified site of right female breast: Secondary | ICD-10-CM | POA: Diagnosis not present

## 2015-01-30 DIAGNOSIS — J8410 Pulmonary fibrosis, unspecified: Secondary | ICD-10-CM | POA: Insufficient documentation

## 2015-01-30 DIAGNOSIS — Z79899 Other long term (current) drug therapy: Secondary | ICD-10-CM | POA: Insufficient documentation

## 2015-01-30 DIAGNOSIS — D0511 Intraductal carcinoma in situ of right breast: Secondary | ICD-10-CM | POA: Diagnosis not present

## 2015-01-30 LAB — GLUCOSE, CAPILLARY: GLUCOSE-CAPILLARY: 120 mg/dL — AB (ref 65–99)

## 2015-01-30 MED ORDER — FLUDEOXYGLUCOSE F - 18 (FDG) INJECTION
13.7500 | Freq: Once | INTRAVENOUS | Status: AC | PRN
  Administered 2015-01-30: 13.75 via INTRAVENOUS

## 2015-02-02 ENCOUNTER — Encounter (HOSPITAL_COMMUNITY): Payer: Self-pay | Admitting: Hematology & Oncology

## 2015-02-02 ENCOUNTER — Encounter (HOSPITAL_COMMUNITY): Payer: Medicare Other | Attending: Oncology | Admitting: Hematology & Oncology

## 2015-02-02 VITALS — BP 192/82 | HR 113 | Temp 98.4°F | Resp 18 | Wt 275.6 lb

## 2015-02-02 DIAGNOSIS — R5383 Other fatigue: Secondary | ICD-10-CM

## 2015-02-02 DIAGNOSIS — J189 Pneumonia, unspecified organism: Secondary | ICD-10-CM

## 2015-02-02 DIAGNOSIS — C7951 Secondary malignant neoplasm of bone: Secondary | ICD-10-CM | POA: Diagnosis not present

## 2015-02-02 DIAGNOSIS — C787 Secondary malignant neoplasm of liver and intrahepatic bile duct: Secondary | ICD-10-CM

## 2015-02-02 DIAGNOSIS — R634 Abnormal weight loss: Secondary | ICD-10-CM

## 2015-02-02 DIAGNOSIS — C50919 Malignant neoplasm of unspecified site of unspecified female breast: Secondary | ICD-10-CM | POA: Diagnosis not present

## 2015-02-02 MED ORDER — EXEMESTANE 25 MG PO TABS: 25.0000 mg | ORAL_TABLET | Freq: Every day | ORAL | Status: DC

## 2015-02-02 MED ORDER — EVEROLIMUS 5 MG PO TABS: 5.0000 mg | ORAL_TABLET | Freq: Every day | ORAL | Status: DC

## 2015-02-02 NOTE — Patient Instructions (Addendum)
[place] at [facility] Discharge Instructions  RECOMMENDATIONS MADE BY THE CONSULTANT AND ANY TEST RESULTS WILL BE SENT TO YOUR REFERRING PHYSICIAN.   Exam completed by Dr Whitney Muse today Re-start taking your medication afinitor 5 mg daily i sent a refill of your Aromasin to your pharmacy  Return to see the doctor in 1 month Please call the clinic if you have any questions or concerns    Thank you for choosing [place] at [facility] to provide your oncology and hematology care.  To afford each patient quality time with our provider, please arrive at least 15 minutes before your scheduled appointment time.    You need to re-schedule your appointment should you arrive 10 or more minutes late.  We strive to give you quality time with our providers, and arriving late affects you and other patients whose appointments are after yours.  Also, if you no show three or more times for appointments you may be dismissed from the clinic at the providers discretion.     Again, thank you for choosing [facility].  Our hope is that these requests will decrease the amount of time that you wait before being seen by our physicians.       _____________________________________________________________  Should you have questions after your visit to [facility], please contact our office at [phone] between the hours of 8:30 a.m. and 4:30 p.m.  Voicemails left after 4:30 p.m. will not be returned until the following business day.  For prescription refill requests, have your pharmacy contact our office.

## 2015-02-02 NOTE — Progress Notes (Signed)
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 7 cm primary tumor, extensive LVI. With focal extenion to the anterior surface margin, 8 of 8 lymph nodes were involved with metastatic disease.    ER positive 100%, PR positive 99%, HER2 neu negative, Ki-67 marker 14%.   Mastectomy followed by radiation therapy to T4 through T7 due to severe involvement at this area.  Arimidex initially from 07/2009 through 03/23/2010 and was progressing by bone scan criteria. We switched her to tamoxifen.   Because of worsening bone pain, MRI of lumbar spine completed on 11/29/2012 demonstrated progressive diffuse lumbar spine metastatic disease and therefore therapy was changed to Fulvestrant and Exemestane on 12/11/2012, tolerating well except for vasomotor instability    Invasive ductal carcinoma of breast ([place])   05/26/2009 Initial Diagnosis Needle core biopsy showed invasive mammary carcinoma with 1 + lymph node   06/03/2009 Surgery Right modified radical mastectomy with 8/8 + lymph nodes by Dr. Ronnell Mitchell   07/22/2009 Cancer Staging Bone scan + for multiple osseous lesions.  No disease elsewhere.   07/24/2009 - 03/23/2010 Chemotherapy Arimidex   03/23/2010 Progression    03/23/2010 - 12/05/2012 Chemotherapy Tamoxifen   11/28/2012 Progression Worsening of lumbar spine osseous disease   12/06/2012 - 09/25/2014 Chemotherapy Fulvestrant + Exemestane   01/24/2014 PET scan No evidence of hypermetabolic recurrent or metastatic disease. Widespread osseous metastasis, without significant marrow hypermetabolism.   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   10/20/2014 - 11/13/2014 Chemotherapy Afinitor + Exemestane   11/13/2014 Adverse Reaction Increasing fatigue   11/13/2014 Treatment Plan Change Hold Afinitor, will reduce  dose to 7.5 mg daily.  Continue Exemestane and Xgeva.   11/24/2014 -  Chemotherapy Afinitor 7.5 mg daily, Exemestane daily, and continuation of Xgeva.   01/16/2015 -  Chemotherapy HOLD AFFINITOR secondary to cough, weight loss, Continue EXEMESTANE and Xgeva   01/30/2015 PET scan Significant partial metabolic treatment response in liver metastases, nonenlarged R level 2 cervical LN, non specific, stable sclerotic osseous metastases throughout skeleton    CURRENT THERAPY:Afinitor/Exemestane/Xgeva  INTERVAL HISTORY: Kaylee Mitchell 60 y.o. female returns for follow-up of her breast cancer, stage IV disease. She unfortunately has had a rise in tumor markers and repeat imaging showed new hepatic metastases.  Kaylee Mitchell returns to the [place] alone today. In general, she says she's been feeling alright. She reports that her appetitie so-so, but says that her taste is coming back a little better. She says she tries to eat. She has been holding affinitor. She is hear today to review PET imaging. Last CXR suggested early pneumonitis, but affinitor was held secondary to ongoing weight loss, fatigue and difficulty eating.  She is happy to hear that her scans look a little better, saying that she's been praying for that to happen.  When advised that chemotherapy is her only option aside from the pill she is currently taking, she says "I don't like that, I don't want that."  When asked if her symptoms could be due to depression, she says she's "not really" depressed. She says the one thing is that her son gets on her nerves because she has to get him up every morning. She reports that this morning she had to "holler, holler, holler." All in all, she says "I ain't depressed about nothing; I'm good."  She reports that her coughing is better, saying "my throat just be dry at night." She comments that she had a cold all last week.  She confirms that overall she feels a whole lot better, saying "I ain't like I  was; I was dragging."  She confirms that she's sleeping at night, because she takes Benadryl and then sleeps.  In general, she denies any questions and says "I'm good."   MEDICAL HISTORY: Past Medical History  Diagnosis Date  . Hypertension   . High cholesterol   . Knee pain, bilateral 2013  . Cancer ([place])     rt breast is primary  . Bone metastasis ([place]) 09/13/2010  . Diabetes mellitus     type II  . Depression   . Depression 12/14/2011  . Invasive ductal carcinoma of breast ([place]) 09/13/2010  . Opioid contract exists with [facility] 10/24/2014    Contract found in Chart review in letter tab.  . 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     has No Known Allergies.  Kaylee Mitchell had no medications administered during this visit.  SURGICAL HISTORY: Past Surgical History  Procedure Laterality Date  . Knee surgery      right-arthroscopy  . Mastectomy      right side  . Abdominal hysterectomy      SOCIAL HISTORY: Social History   Social History  . Marital Status: Single    Spouse Name: N/A  . Number of Children: N/A  . Years of Education: 12   Occupational History  . disabled    Social History Main Topics  . Smoking status: Never Smoker   . Smokeless tobacco: Never Used  . Alcohol Use: No  . Drug Use: No  . Sexual Activity: Yes    Birth Control/ Protection: Surgical   Other Topics Concern  . Not on file   Social History Narrative    FAMILY  HISTORY: Family History  Problem Relation Age of Onset  . Arthritis    . Cancer    . Diabetes    . Anesthesia problems Neg Hx   . Hypotension Neg Hx   . Malignant hyperthermia Neg Hx   . Pseudochol deficiency Neg Hx   . Diabetes Mother   . Diabetes Father   . Cancer Maternal Aunt     Review of Systems  Constitutional:  Positive for malaise/fatigue, weight loss, and loss of appetite. Negative for fever, chills and diaphoresis.      Loss of 20 lbs since September. HENT: Negative for congestion, ear discharge, ear pain, hearing loss, nosebleeds, sore throat and tinnitus.   Eyes: Negative.   Respiratory: Negative.  Negative for stridor.   Cardiovascular: Negative.   Gastrointestinal: Negative. Genitourinary: Negative.   Musculoskeletal: Negative. Skin: Negative.   Neurological: Negative for dizziness, tingling, tremors, sensory change, speech change, focal weakness, seizures, headaches, loss of consciousness and weakness.  Endo/Heme/Allergies: Negative.   Psychiatric/Behavioral: Negative.   14 point review of systems was performed and is negative except as detailed under history of present illness and above   PHYSICAL EXAMINATION  ECOG PERFORMANCE STATUS: 1 - Symptomatic but completely ambulatory  Filed Vitals:   02/02/15 0841  BP: 192/82  Pulse: 113  Temp: 98.4 F (36.9 C)  Resp: 18    Physical Exam  Constitutional: She is oriented to person, place, and time and well-developed, well-nourished, and in no distress.  Obese.  HENT:  Head: Normocephalic and atraumatic.  Nose: Nose normal.  Mouth/Throat: Oropharynx is clear and moist. No oropharyngeal exudate.  Eyes: Conjunctivae and EOM are normal. Pupils are equal, round, and reactive to light. Right eye exhibits no discharge. Left eye exhibits no discharge. No scleral icterus.  Neck: Normal range of motion. Neck supple. No tracheal deviation present. No thyromegaly present.  Cardiovascular: Normal rate, regular rhythm  and normal heart sounds.  Exam reveals no gallop and no friction rub.   No murmur heard. Pulmonary/Chest: Effort normal and breath sounds normal. She has no wheezes. She has no rales.  Abdominal: Soft. Bowel sounds are normal. She exhibits no distension and no mass. There is no tenderness. There is no rebound and no guarding. Biopsy site is barely noticeable Musculoskeletal: Normal range of motion. She exhibits no edema.  Lymphadenopathy:    She has no cervical adenopathy.  Neurological: She is alert and oriented to person, place, and time. She has normal reflexes. No cranial nerve deficit. Gait normal. Coordination normal.  Skin: Skin is warm and dry. No rash noted.  Psychiatric: Mood, memory, affect and judgment normal.  Nursing note and vitals reviewed.   LABORATORY DATA: I have reviewed the results below as listed.    CBC    Component Value Date/Time   WBC 4.8 01/16/2015 0900   RBC 5.02 01/16/2015 0900   HGB 13.6 01/16/2015 0900   HCT 42.3 01/16/2015 0900   PLT 177 01/16/2015 0900   MCV 84.3 01/16/2015 0900   MCH 27.1 01/16/2015 0900   MCHC 32.2 01/16/2015 0900   RDW 13.6 01/16/2015 0900   LYMPHSABS 2.4 01/16/2015 0900   MONOABS 0.4 01/16/2015 0900   EOSABS 0.2 01/16/2015 0900   BASOSABS 0.0 01/16/2015 0900   CMP     Component Value Date/Time   NA 139 01/16/2015 0900   K 3.8 01/16/2015 0900   CL 106 01/16/2015 0900   CO2 27 01/16/2015 0900   GLUCOSE 152* 01/16/2015 0900   BUN 16 01/16/2015 0900   CREATININE 1.26* 01/16/2015 0900   CALCIUM 9.5 01/16/2015 0900   PROT 7.0 01/16/2015 0900   ALBUMIN 3.7 01/16/2015 0900   AST 27 01/16/2015 0900   ALT 23 01/16/2015 0900   ALKPHOS 70 01/16/2015 0900   BILITOT 0.7 01/16/2015 0900   GFRNONAA 45* 01/16/2015 0900   GFRAA 52* 01/16/2015 0900    RADIOLOGY  Dg Chest 1 View  01/22/2015  CLINICAL DATA:  Body aches.  Cough. EXAM: CHEST 1 VIEW COMPARISON:  New 12/17/2013. FINDINGS: Mediastinum hilar structures are stable.  Stable mild cardiomegaly. Diffuse mild pulmonary interstitial prominence noted. These changes could be related to interstitial edema and/or pneumonitis. No pleural effusion or pneumothorax. Surgical clips right axilla. Degenerative changes and scoliosis thoracic spine. IMPRESSION: 1. Diffuse mild bilateral from interstitial prominence. Findings could be secondary to mild interstitial edema and/or interstitial pneumonitis. 2. Stable cardiomegaly. Electronically Signed   By: Marcello Moores  Register   On: 01/22/2015 09:47   Nm Pet Image Restag (ps) Skull Base To Thigh  01/30/2015  CLINICAL DATA:  Subsequent treatment strategy for metastatic invasive ductal carcinoma of the right breast diagnosed in May 2011 status post  modified radical mastectomy, with bone and liver metastases, with ongoing chemotherapy. EXAM: NUCLEAR MEDICINE PET SKULL BASE TO THIGH TECHNIQUE: 13.8 mCi F-18 FDG was injected intravenously. Full-ring PET imaging was performed from the skull base to thigh after the radiotracer. CT data was obtained and used for attenuation correction and anatomic localization. FASTING BLOOD GLUCOSE:  Value: 120 mg/dl COMPARISON:  09/18/2014 PET-CT. FINDINGS: NECK There is a newly mildly hypermetabolic nonenlarged 0.8 cm right level 2 cervical lymph node (series 4/ image 29) with max SUV 6.4. No additional hypermetabolic cervical lymph nodes. CHEST No hypermetabolic axillary, mediastinal or hilar nodes. Right axillary nodal dissection clips and right mastectomy, with no hypermetabolic local right chest wall tumor recurrence. Stable dilated main pulmonary artery (3.3 cm diameter). Coronary atherosclerosis. Stable calcified subcentimeter granuloma in the left lower lobe. No acute consolidative airspace disease or significant pulmonary nodules. ABDOMEN/PELVIS There is decreased size and metabolism within the minimally hypermetabolic segment 4A left liver lobe metastasis, which now measures 2.6 x 2.4 cm (series 4/image 8) with  max SUV 5.8, decreased from 5.3 x 3.7 cm with max SUV 12.2 on 09/18/2014 using similar measurement technique. There is decreased size and metabolism within the mildly hypermetabolic segment 8 right liver lobe metastasis, which now measures 2.4 x 1.9 cm (series 4/image 93) with max SUV 6.5, decreased from 3.2 x 2.4 cm with max SUV 12.4 on 09/18/2014. No additional hypermetabolic liver metastases. No abnormal hypermetabolic activity within the pancreas, adrenal glands, or spleen. No hypermetabolic lymph nodes in the abdomen or pelvis. Mild scattered colonic diverticulosis. Hysterectomy. SKELETON Patchy confluent sclerotic osseous metastases throughout the axial and proximal appendicular skeleton appear stable on the CT images, with no appreciable hypermetabolism within the areas of sclerotic osseous disease. Mild patchy hypermetabolism in the lumbar spine does not appear to correlate with the areas of sclerotic metastases, and is favored to represent mild treatment related reactive marrow hypermetabolism. IMPRESSION: 1. Significant partial metabolic treatment response in the liver metastases. 2. Solitary newly mildly hypermetabolic nonenlarged right level 2 cervical lymph node, nonspecific, which could be reactive or metastatic. 3. No additional potential sites of hypermetabolic metastatic disease. 4. Stable widespread sclerotic osseous metastases throughout the axial and proximal appendicular skeleton, without appreciable hypermetabolism within the sclerotic metastases, favor treated disease. Mild patchy hypermetabolism in the lumbar spine does not appear to correlate with the areas of sclerotic metastases, and is favored to represent mild treatment related reactive marrow hypermetabolism. Electronically Signed   By: Ilona Sorrel M.D.   On: 01/30/2015 09:33    Study Result     CLINICAL DATA: Subsequent treatment strategy for metastatic invasive ductal carcinoma of the right breast diagnosed in May  2011 status post modified radical mastectomy, with bone and liver metastases, with ongoing chemotherapy.  EXAM: NUCLEAR MEDICINE PET SKULL BASE TO THIGH  TECHNIQUE: 13.8 mCi F-18 FDG was injected intravenously. Full-ring PET imaging was performed from the skull base to thigh after the radiotracer. CT data was obtained and used for attenuation correction and anatomic localization.  FASTING BLOOD GLUCOSE: Value: 120 mg/dl  COMPARISON: 09/18/2014 PET-CT.  FINDINGS: NECK  There is a newly mildly hypermetabolic nonenlarged 0.8 cm right level 2 cervical lymph node (series 4/ image 29) with max SUV 6.4. No additional hypermetabolic cervical lymph nodes.  CHEST  No hypermetabolic axillary, mediastinal or hilar nodes. Right axillary nodal dissection clips and right mastectomy, with no hypermetabolic local right chest wall tumor recurrence. Stable dilated main pulmonary artery (3.3 cm diameter). Coronary atherosclerosis. Stable calcified  subcentimeter granuloma in the left lower lobe. No acute consolidative airspace disease or significant pulmonary nodules.  ABDOMEN/PELVIS  There is decreased size and metabolism within the minimally hypermetabolic segment 4A left liver lobe metastasis, which now measures 2.6 x 2.4 cm (series 4/image 8) with max SUV 5.8, decreased from 5.3 x 3.7 cm with max SUV 12.2 on 09/18/2014 using similar measurement technique.  There is decreased size and metabolism within the mildly hypermetabolic segment 8 right liver lobe metastasis, which now measures 2.4 x 1.9 cm (series 4/image 93) with max SUV 6.5, decreased from 3.2 x 2.4 cm with max SUV 12.4 on 09/18/2014.  No additional hypermetabolic liver metastases.  No abnormal hypermetabolic activity within the pancreas, adrenal glands, or spleen. No hypermetabolic lymph nodes in the abdomen or pelvis. Mild scattered colonic diverticulosis. Hysterectomy.  SKELETON  Patchy confluent  sclerotic osseous metastases throughout the axial and proximal appendicular skeleton appear stable on the CT images, with no appreciable hypermetabolism within the areas of sclerotic osseous disease. Mild patchy hypermetabolism in the lumbar spine does not appear to correlate with the areas of sclerotic metastases, and is favored to represent mild treatment related reactive marrow hypermetabolism.  IMPRESSION: 1. Significant partial metabolic treatment response in the liver metastases. 2. Solitary newly mildly hypermetabolic nonenlarged right level 2 cervical lymph node, nonspecific, which could be reactive or metastatic. 3. No additional potential sites of hypermetabolic metastatic disease. 4. Stable widespread sclerotic osseous metastases throughout the axial and proximal appendicular skeleton, without appreciable hypermetabolism within the sclerotic metastases, favor treated disease. Mild patchy hypermetabolism in the lumbar spine does not appear to correlate with the areas of sclerotic metastases, and is favored to represent mild treatment related reactive marrow hypermetabolism.   Electronically Signed  By: Ilona Sorrel M.D.  On: 01/30/2015 09:33     INDICATION: History of breast cancer, now with new hypermetabolic liver lesions worrisome for metastatic disease. Please perform CT-guided biopsy for tissue diagnostic purposes  EXAM: CT-GUIDED BIOPSY OF DOMINANT INDETERMINATE MASS WITHIN ANTERIOR SEGMENT OF THE RIGHT LOBE OF THE LIVER  COMPARISON: PET-CT - 09/18/2014; 01/24/2014  MEDICATIONS: Fentanyl 100 mcg IV; Versed 2 mg IV  ANESTHESIA/SEDATION: Sedation time  12 minutes  CONTRAST: None  COMPLICATIONS: None immediate  PROCEDURE: Informed consent was obtained from the patient following an explanation of the procedure, risks, benefits and alternatives. A time out was performed prior to the initiation of the procedure.  The patient was  positioned supine on the CT table and a limited CT was performed for procedural planning demonstrating the dominant ill-defined hypo attenuating mass within the anterior segment of the right lobe of the liver with dominant ill-defined component measuring approximately 7.5 x 6.9 cm (image 30, series 2). The procedure was planned. The operative site was prepped and draped in the usual sterile fashion. Appropriate trajectory was confirmed with a 22 gauge spinal needle after the adjacent tissues were anesthetized with 1% Lidocaine with epinephrine.  Under intermittent CT guidance, a 17 gauge coaxial needle was advanced into the peripheral aspect of the mass. Appropriate positioning was confirmed and 5 core needle biopsy samples were obtained with an 18 gauge core needle biopsy device. The co-axial needle was removed and hemostasis was achieved with manual compression.  A limited postprocedural CT was negative for hemorrhage or additional complication. A dressing was placed. The patient tolerated the procedure well without immediate postprocedural complication.  IMPRESSION: Technically successful CT guided core needle biopsy of ill-defined dominant mass within the anterior segment of the right  lobe of the liver.   Electronically Signed  By: Sandi Mariscal M.D.  On: 09/30/2014 12:37  PATHOLOGY: PORT OF SURGICAL PATHOLOGY ADDITIONAL INFORMATION: FLUORESCENCE IN-SITU HYBRIDIZATION Results: HER2 - NEGATIVE RATIO OF HER2/CEP17 SIGNALS 1.18 AVERAGE HER2 COPY NUMBER PER CELL 1.95 Reference Range: NEGATIVE HER2/CEP17 Ratio <2.0 and average HER2 copy number <4.0 EQUIVOCAL HER2/CEP17 Ratio <2.0 and average HER2 copy number >=4.0 and <6.0 POSITIVE HER2/CEP17 Ratio >=2.0 or <2.0 and average HER2 copy number >=6.0 Enid Cutter MD Pathologist, Electronic Signature ( Signed 10/07/2014) PROGNOSTIC INDICATORS Results: IMMUNOHISTOCHEMICAL AND MORPHOMETRIC ANALYSIS PERFORMED  MANUALLY Estrogen Receptor: 100%, POSITIVE, STRONG STAINING INTENSITY Progesterone Receptor: 90%, POSITIVE, STRONG STAINING INTENSITY REFERENCE RANGE ESTROGEN RECEPTOR NEGATIVE 0% POSITIVE =>1% REFERENCE RANGE PROGESTERONE RECEPTOR NEGATIVE 0% POSITIVE =>1% All controls stained appropriately 1 of 3 FINAL for NACHELLE, NEGRETTE ([id number]) ADDITIONAL INFORMATION:(continued) Mali RUND DO Pathologist, Electronic Signature    ASSESSMENT and THERAPY PLAN:  Stage IV ER+ carcinoma of the breast with bone metastases Chronic Leg pain Low calcium on XGEVA Questionable drug diversion Liver metastases ER+ PR+ HER 2 neu - Afinitor, exemestane Mild pneumonitis on afinitor Anorexia on afinitor  The patient has been on Arimidex, tamoxifen, and was started on fulvestrant and exemestane in November 2014. She unfortunately developed liver metastases.We tried her on afinitor/aromasin and due to difficulties with nausea on afinitor, reduced her dose to 7.5 mg daily. She has missed two PET appointments for restaging because of nausea.  She is currently holding afinitor. We reviewed her PET, she has had a response.  I am going to dose reduce her to 5 mg daily based upon her loss of appetite, weight loss, mild pneumonitis and fatigue. Hopefully she will be able to tolerate a 5 mg dose and still continue to have a good response. We will write a new prescription today, she will restart her medication once she receives it. We will see her back in 2 weeks for ongoing assessment.  All questions were answered. The patient knows to call the clinic with any problems, questions or concerns. We can certainly see the patient much sooner if necessary.    This document serves as a record of services personally performed by Ancil Linsey, MD. It was created on her behalf by Toni Amend, a trained medical scribe. The creation of this record is based on the scribe's personal observations and the provider's  statements to them. This document has been checked and approved by the attending provider.  I have reviewed the above documentation for accuracy and completeness, and I agree with the above.  This note was electronically signed.  Kelby Fam. Whitney Muse, MD

## 2015-02-13 ENCOUNTER — Encounter (HOSPITAL_COMMUNITY): Payer: Medicare Other

## 2015-02-13 ENCOUNTER — Encounter (HOSPITAL_BASED_OUTPATIENT_CLINIC_OR_DEPARTMENT_OTHER): Payer: Medicare Other

## 2015-02-13 VITALS — BP 121/93 | HR 101 | Temp 98.8°F | Resp 20

## 2015-02-13 DIAGNOSIS — C50919 Malignant neoplasm of unspecified site of unspecified female breast: Secondary | ICD-10-CM | POA: Diagnosis not present

## 2015-02-13 DIAGNOSIS — C7951 Secondary malignant neoplasm of bone: Secondary | ICD-10-CM | POA: Diagnosis not present

## 2015-02-13 LAB — COMPREHENSIVE METABOLIC PANEL
ALBUMIN: 3.5 g/dL (ref 3.5–5.0)
ALT: 16 U/L (ref 14–54)
ANION GAP: 9 (ref 5–15)
AST: 25 U/L (ref 15–41)
Alkaline Phosphatase: 86 U/L (ref 38–126)
BUN: 23 mg/dL — AB (ref 6–20)
CHLORIDE: 103 mmol/L (ref 101–111)
CO2: 27 mmol/L (ref 22–32)
Calcium: 9.1 mg/dL (ref 8.9–10.3)
Creatinine, Ser: 1.17 mg/dL — ABNORMAL HIGH (ref 0.44–1.00)
GFR calc Af Amer: 57 mL/min — ABNORMAL LOW (ref >60)
GFR calc non Af Amer: 49 mL/min — ABNORMAL LOW (ref >60)
GLUCOSE: 215 mg/dL — AB (ref 65–99)
POTASSIUM: 4.3 mmol/L (ref 3.5–5.1)
SODIUM: 139 mmol/L (ref 135–145)
Total Bilirubin: 0.6 mg/dL (ref 0.3–1.2)
Total Protein: 7.3 g/dL (ref 6.5–8.1)

## 2015-02-13 MED ORDER — OXYCODONE HCL 10 MG PO TABS: ORAL_TABLET | ORAL | Status: DC

## 2015-02-13 MED ORDER — DENOSUMAB 120 MG/1.7ML ~~LOC~~ SOLN
120.0000 mg | Freq: Once | SUBCUTANEOUS | Status: AC
  Administered 2015-02-13: 120 mg via SUBCUTANEOUS
  Filled 2015-02-13: qty 1.7

## 2015-02-13 NOTE — Progress Notes (Signed)
Kaylee Mitchell presents today for injection per MD orders. Xgeva 120 mg administered SQ in left Abdomen. Administration without incident. Patient tolerated well.  

## 2015-02-13 NOTE — Patient Instructions (Signed)
[place] at [facility] Discharge Instructions  RECOMMENDATIONS MADE BY THE CONSULTANT AND ANY TEST RESULTS WILL BE SENT TO YOUR REFERRING PHYSICIAN.  Xgeva 120 mg injection given today as ordered. Return as scheduled.  Thank you for choosing [place] at [facility] to provide your oncology and hematology care.  To afford each patient quality time with our provider, please arrive at least 15 minutes before your scheduled appointment time.   Beginning January 23rd 2017 lab work for the [facility] will be done in the  Main lab at [facility] on 1st floor. If you have a lab appointment with the [facility] please come in thru the  Main Entrance and check in at the main information desk  You need to re-schedule your appointment should you arrive 10 or more minutes late.  We strive to give you quality time with our providers, and arriving late affects you and other patients whose appointments are after yours.  Also, if you no show three or more times for appointments you may be dismissed from the clinic at the providers discretion.     Again, thank you for choosing [facility].  Our hope is that these requests will decrease the amount of time that you wait before being seen by our physicians.       _____________________________________________________________  Should you have questions after your visit to [facility], please contact our office at [phone] between the hours of 8:30 a.m. and 4:30 p.m.  Voicemails left after 4:30 p.m. will not be returned until the following business day.  For prescription refill requests, have your pharmacy contact our office.

## 2015-02-27 DIAGNOSIS — E1042 Type 1 diabetes mellitus with diabetic polyneuropathy: Secondary | ICD-10-CM | POA: Diagnosis not present

## 2015-02-27 DIAGNOSIS — C7951 Secondary malignant neoplasm of bone: Secondary | ICD-10-CM | POA: Diagnosis not present

## 2015-02-27 DIAGNOSIS — E1165 Type 2 diabetes mellitus with hyperglycemia: Secondary | ICD-10-CM | POA: Diagnosis not present

## 2015-02-27 DIAGNOSIS — C50911 Malignant neoplasm of unspecified site of right female breast: Secondary | ICD-10-CM | POA: Diagnosis not present

## 2015-03-05 ENCOUNTER — Encounter (HOSPITAL_COMMUNITY): Payer: Self-pay | Admitting: Oncology

## 2015-03-05 ENCOUNTER — Encounter (HOSPITAL_COMMUNITY): Payer: Medicare Other | Attending: Oncology | Admitting: Oncology

## 2015-03-05 VITALS — BP 157/73 | HR 106 | Temp 97.9°F | Resp 18 | Wt 272.0 lb

## 2015-03-05 DIAGNOSIS — C7951 Secondary malignant neoplasm of bone: Secondary | ICD-10-CM

## 2015-03-05 DIAGNOSIS — C779 Secondary and unspecified malignant neoplasm of lymph node, unspecified: Secondary | ICD-10-CM | POA: Diagnosis not present

## 2015-03-05 DIAGNOSIS — C50919 Malignant neoplasm of unspecified site of unspecified female breast: Secondary | ICD-10-CM

## 2015-03-05 DIAGNOSIS — C50911 Malignant neoplasm of unspecified site of right female breast: Secondary | ICD-10-CM

## 2015-03-05 MED ORDER — ONDANSETRON HCL 8 MG PO TABS: 8.0000 mg | ORAL_TABLET | Freq: Three times a day (TID) | ORAL | Status: DC | PRN

## 2015-03-05 MED ORDER — OXYCODONE HCL 10 MG PO TABS: ORAL_TABLET | ORAL | Status: DC

## 2015-03-05 NOTE — Progress Notes (Signed)
      KNOWLTON,STEPHEN D, MD [address] [place] [address]  Invasive ductal carcinoma of breast, unspecified laterality (HCC) - Plan: CBC with Differential  Invasive ductal carcinoma of breast, right (HCC) - Plan: ondansetron (ZOFRAN) 8 MG tablet  Bone metastasis (HCC) - Plan: Oxycodone HCl 10 MG TABS  CURRENT THERAPY: Afinitor (5 mg) + Exemestane.  Xgeva monthly.  INTERVAL HISTORY: Kaylee Mitchell 60 y.o. female returns for followup of Stage IV breast cancer, ER/PR+, HER2 neg, with new hepatic metastasis (biopsy proven).    Invasive ductal carcinoma of breast (HCC)   05/26/2009 Initial Diagnosis Needle core biopsy showed invasive mammary carcinoma with 1 + lymph node   06/03/2009 Surgery Right modified radical mastectomy with 8/8 + lymph nodes by Dr. Brent Zeigler   07/22/2009 Cancer Staging Bone scan + for multiple osseous lesions.  No disease elsewhere.   07/24/2009 - 03/23/2010 Chemotherapy Arimidex   03/23/2010 Progression    03/23/2010 - 12/05/2012 Chemotherapy Tamoxifen   11/28/2012 Progression Worsening of lumbar spine osseous disease   12/06/2012 - 09/25/2014 Chemotherapy Fulvestrant + Exemestane   01/24/2014 PET scan No evidence of hypermetabolic recurrent or metastatic disease. Widespread osseous metastasis, without significant marrow hypermetabolism.   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   10/20/2014 - 11/13/2014 Chemotherapy Afinitor + Exemestane   11/13/2014 Adverse Reaction Increasing fatigue   11/13/2014 Treatment Plan Change Hold Afinitor, will reduce dose to 7.5 mg daily.  Continue Exemestane and Xgeva.   11/24/2014 - 01/16/2015 Chemotherapy Afinitor 7.5 mg daily, Exemestane daily, and continuation of Xgeva.   01/16/2015 Adverse Reaction Cough, ?Afinitor-induced   01/16/2015 - 02/02/2015 Chemotherapy HOLD AFFINITOR secondary to cough, weight loss, Continue EXEMESTANE and Xgeva   01/30/2015  PET scan Significant partial metabolic treatment response in liver metastases, nonenlarged R level 2 cervical LN, non specific, stable sclerotic osseous metastases throughout skeleton   02/02/2015 Adverse Reaction Weight loss, decreased appetite   02/02/2015 Treatment Plan Change Afinitor dose further reduced to 5 mg.   02/02/2015 -  Chemotherapy Afinitor 5 mg and Exemestane with continuation of Xgeva   I personally reviewed and went over laboratory results with the patient.  The results are noted within this dictation.   Labs will be updated next week.  I personally reviewed and went over radiographic studies with the patient.  The results are noted within this dictation.  I reviewed her PET scan results with her again.  She notes that she is doing better at the reduced dose of Afinitor.  She is taking 5 mg.  She denies any mouth sores.  She notes an improvement in her appetite at the lower dose.  She continues to lose weight and we will monitor.    She otherwise denies any complaints.  Past Medical History  Diagnosis Date  . Hypertension   . High cholesterol   . Knee pain, bilateral 2013  . Cancer (HCC)     rt breast is primary  . Bone metastasis (HCC) 09/13/2010  . Diabetes mellitus     type II  . Depression   . Depression 12/14/2011  . Invasive ductal carcinoma of breast (HCC) 09/13/2010  . Opioid contract exists with [facility] 10/24/2014    Contract found in Chart review in letter tab.  . DNR (do not resuscitate) 11/05/2014    has Invasive ductal carcinoma of breast (HCC); Bone metastasis (HCC); Medial meniscus, posterior horn derangement; S/P   arthroscopy of left knee; Difficulty in walking(719.7); Depression; Diabetes mellitus with chronic kidney disease (HCC); CKD (chronic kidney disease); Hyperlipidemia; Essential hypertension, benign; Morbid obesity (HCC); Tinea versicolor; Lumbar back pain; URI, acute; Lesion of liver; Opioid contract exists with [facility]; and DNR (do not resuscitate) on her problem list.     has No Known Allergies.  Current Outpatient Prescriptions on File Prior to Visit  Medication Sig Dispense Refill  . aspirin EC 81 MG tablet Take 81 mg by mouth daily.      . atorvastatin (LIPITOR) 20 MG tablet TAKE ONE TABLET BY MOUTH ONCE DAILY 30 tablet 0  . calcium-vitamin D (OSCAL WITH D) 500-200 MG-UNIT per tablet Take 1 tablet by mouth 2 (two) times daily.     . carvedilol (COREG) 12.5 MG tablet TAKE ONE TABLET BY MOUTH TWICE DAILY WITH MEALS 180 tablet 0  . cyclobenzaprine (FLEXERIL) 10 MG tablet Take 1 tablet (10 mg total) by mouth 3 (three) times daily as needed for muscle spasms. 10 tablet 0  . denosumab (XGEVA) 120 MG/1.7ML SOLN injection Inject 120 mg into the skin every 30 (thirty) days.    . diphenhydrAMINE (BENADRYL) 25 MG tablet Take 25 mg by mouth at bedtime as needed.    . docusate sodium (STOOL SOFTENER) 100 MG capsule Take 100 mg by mouth daily as needed for mild constipation.     . everolimus (AFINITOR) 5 MG tablet Take 1 tablet (5 mg total) by mouth daily. 30 tablet 3  . exemestane (AROMASIN) 25 MG tablet Take 1 tablet (25 mg total) by mouth daily after breakfast. 90 tablet 3  . gabapentin (NEURONTIN) 300 MG capsule Take 300 mg by mouth as needed.   3  . Insulin Glargine (LANTUS) 100 UNIT/ML Solostar Pen Inject 60 Units into the skin at bedtime.    . LORazepam (ATIVAN PO) Take 1 tablet by mouth daily as needed (anxiety).     . losartan-hydrochlorothiazide (HYZAAR) 50-12.5 MG per tablet Take 1 tablet by mouth daily.    . metFORMIN (GLUCOPHAGE) 500 MG tablet Take 500 mg by mouth 2 (two) times daily with a meal.    . Polyethylene Glycol 3350 (MIRALAX PO) Take 17 g by mouth daily as needed (constipation).     . potassium chloride SA (K-DUR,KLOR-CON) 20 MEQ tablet Take 1 tablet (20 mEq total) by mouth daily. 90 tablet 4  . sertraline (ZOLOFT) 100 MG tablet Take 100 mg by mouth daily.    . venlafaxine XR (EFFEXOR  XR) 150 MG 24 hr capsule Take take one capsule at bedtime nightly to control hot flashes. 60 capsule 6  . VENTOLIN HFA 108 (90 BASE) MCG/ACT inhaler Inhale 2 puffs into the lungs 4 (four) times daily as needed for wheezing or shortness of breath. Reported on 02/02/2015  0  . omeprazole (PRILOSEC) 40 MG capsule TAKE 1 CAPSULE (40 MG TOTAL) BY MOUTH DAILY. (Patient not taking: Reported on 01/16/2015) 30 capsule 3  . [DISCONTINUED] loratadine (CLARITIN) 10 MG tablet Take 10 mg by mouth daily as needed. For allergies     Current Facility-Administered Medications on File Prior to Visit  Medication Dose Route Frequency Provider Last Rate Last Dose  . 1 mL EPINEPHrine 1 mg/mL (1:1000) in 0.9% Normal Saline 3000 mL irrigation    PRN Stanley E Harrison, MD        Past Surgical History  Procedure Laterality Date  . Knee surgery      right-arthroscopy  . Mastectomy        right side  . Abdominal hysterectomy      Denies any headaches, double vision, fevers, chills, night sweats, vomiting, diarrhea, constipation, chest pain, heart palpitations, shortness of breath, blood in stool, black tarry stool, urinary pain, urinary burning, urinary frequency, hematuria.   PHYSICAL EXAMINATION  ECOG PERFORMANCE STATUS: 1 - Symptomatic but completely ambulatory  Filed Vitals:   03/05/15 0938  BP: 157/73  Pulse: 106  Temp: 97.9 F (36.6 C)  Resp: 18    GENERAL:alert, no distress, well developed, comfortable, cooperative, obese, smiling, unaccompanied SKIN: skin color, texture, turgor are normal, no rashes or significant lesions HEAD: Normocephalic, No masses, lesions, tenderness or abnormalities EYES: normal, PERRLA EARS: External ears normal OROPHARYNX:no exudate, no erythema, lips, buccal mucosa, and tongue normal and mucous membranes are moist.  No sores noted on exam. NECK: supple, trachea midline LYMPH:  No neck adenopathy BREAST:not examined LUNGS: CTA B/L without wheezes, rales, and  rhonchi HEART: RRR without murmur, rub or gallop.  Normal S1 and S2. ABDOMEN:abdomen soft, non-tender and obese BACK: Back symmetric, no curvature. EXTREMITIES:less then 2 second capillary refill, no joint deformities, effusion, or inflammation, no skin discoloration, no cyanosis  NEURO: alert & oriented x 3 with fluent speech, no focal motor/sensory deficits, gait normal   LABORATORY DATA: CBC    Component Value Date/Time   WBC 4.8 01/16/2015 0900   RBC 5.02 01/16/2015 0900   HGB 13.6 01/16/2015 0900   HCT 42.3 01/16/2015 0900   PLT 177 01/16/2015 0900   MCV 84.3 01/16/2015 0900   MCH 27.1 01/16/2015 0900   MCHC 32.2 01/16/2015 0900   RDW 13.6 01/16/2015 0900   LYMPHSABS 2.4 01/16/2015 0900   MONOABS 0.4 01/16/2015 0900   EOSABS 0.2 01/16/2015 0900   BASOSABS 0.0 01/16/2015 0900      Chemistry      Component Value Date/Time   NA 139 02/13/2015 0924   K 4.3 02/13/2015 0924   CL 103 02/13/2015 0924   CO2 27 02/13/2015 0924   BUN 23* 02/13/2015 0924   CREATININE 1.17* 02/13/2015 0924      Component Value Date/Time   CALCIUM 9.1 02/13/2015 0924   ALKPHOS 86 02/13/2015 0924   AST 25 02/13/2015 0924   ALT 16 02/13/2015 0924   BILITOT 0.6 02/13/2015 0924     Lab Results  Component Value Date   LABCA2 53.0* 01/16/2015      PENDING LABS:   RADIOGRAPHIC STUDIES:  No results found.   PATHOLOGY: As previously noted    ASSESSMENT AND PLAN:  Invasive ductal carcinoma of breast Stage IV Breast cancer with bone involvement requiring Xgeva.  Previously on Faslodex and Aromasin until imaging demonstrated a new hepatic lesion resulting in biopsy proven progression of disease.  Now on Aromasin + Afinitor (5 mg beginning on 02/02/2015, reduced from 7.5 mg).  Response to therapy noted on PET imaging on 01/30/2015.  Oncology history is updated.  Afinitor dose decreased to 5 mg with continuation of all other treatments beginning on 02/02/2015.  She is tolerating this new  reduced dose well without any complaints.  She reports that her appetite is improved.  She continues to lose weight and this will require close surveillance moving forward.  Based upon results from the SWISH trial, I will prescribe Dexamethasone mouthwash.  SWISH trial demonstrated a significant reduction in grade 2 or higher stomatitis with Dexamethsone mouthwash on evaluation on week 8 of therapy with a reported incidence of grade 2 or greater stomatitis of 2.4% (compared to   33% in BOLERA-2 trial).  Rx for Dexamethasone provided on 10/24/2014: 0.5mg/5mL, swish for 2 minutes and spit QID x 8 weeks (or more).  Do not eat 1 hour after using.   She is continuing with this intervention.  Continue Dexamethasone Swish and Swallow.  Pain contract in place with [facility].  DO NOT RESUSCITATE.  DNR form filled out and provided to the patient on 10/24/2014.  Labs next week with Xgeva: CBC diff, CMET.    Labs every 2 weeks: CBC diff, CMET.  I have refilled her Zofran and this is [facility].  I have also refilled her Oxycodone with notation that it is to be filled on 03/10/2015 (or later).  She will return in 3 weeks for follow-up with labs.  She knows to call us with any issues.  Sliding Scale: 151-200: 2 units 201-250: 4 units 251- 300: 6 units Greater than 300: 8 units and call MD.   THERAPY PLAN:  Continue Afinitor 5 mg, Exemestane, and Xgeva. Will be mindful of Afinitor side effects including, but not limited to, stomatitis and pneumonitis.    All questions were answered. The patient knows to call the clinic with any problems, questions or concerns. We can certainly see the patient much sooner if necessary.  Patient and plan discussed with Dr. Shannon Penland and she is in agreement with the aforementioned.   This note is electronically signed by: KEFALAS,THOMAS, PA-C 03/05/2015 8:36 PM 

## 2015-03-05 NOTE — Patient Instructions (Addendum)
[place] at [facility] Discharge Instructions  RECOMMENDATIONS MADE BY THE CONSULTANT AND ANY TEST RESULTS WILL BE SENT TO YOUR REFERRING PHYSICIAN.  Exam done and seen by Kirby Crigler PA-C We sent you zofran to pharmacy Xgeva next week Labs in 3 weeks  Return in 3 weeks for follow up with Tom Return in 7weeks for appointment with Dr.Penland   Thank you for choosing [place] at [facility] to provide your oncology and hematology care.  To afford each patient quality time with our provider, please arrive at least 15 minutes before your scheduled appointment time.   Beginning January 23rd 2017 lab work for the [facility] will be done in the  Main lab at [facility] on 1st floor. If you have a lab appointment with the [facility] please come in thru the  Main Entrance and check in at the main information desk  You need to re-schedule your appointment should you arrive 10 or more minutes late.  We strive to give you quality time with our providers, and arriving late affects you and other patients whose appointments are after yours.  Also, if you no show three or more times for appointments you may be dismissed from the clinic at the providers discretion.     Again, thank you for choosing [facility].  Our hope is that these requests will decrease the amount of time that you wait before being seen by our physicians.       _____________________________________________________________  Should you have questions after your visit to [facility], please contact our office at [phone] between the hours of 8:30 a.m. and 4:30 p.m.  Voicemails left after 4:30 p.m. will not be returned until the following business day.  For prescription refill requests, have your pharmacy contact our office.

## 2015-03-05 NOTE — Assessment & Plan Note (Addendum)
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  Previously on Faslodex and Aromasin until imaging demonstrated a new hepatic lesion resulting in biopsy proven progression of disease.  Now on Aromasin + Afinitor (5 mg beginning on 02/02/2015, reduced from 7.5 mg).  Response to therapy noted on PET imaging on 01/30/2015.  Oncology history is updated.  Afinitor dose decreased to 5 mg with continuation of all other treatments beginning on 02/02/2015.  She is tolerating this new reduced dose well without any complaints.  She reports that her appetite is improved.  She continues to lose weight and this will require close surveillance moving forward.  Based upon results from the SWISH trial, I will prescribe Dexamethasone mouthwash.  SWISH trial demonstrated a significant reduction in grade 2 or higher stomatitis with Dexamethsone mouthwash on evaluation on week 8 of therapy with a reported incidence of grade 2 or greater stomatitis of 2.4% (compared to 33% in BOLERA-2 trial).  Rx for Dexamethasone provided on 10/24/2014: 0.5mg /90mL, swish for 2 minutes and spit QID x 8 weeks (or more).  Do not eat 1 hour after using.   She is continuing with this intervention.  Continue Dexamethasone Swish and Swallow.  Pain contract in place with [facility].  DO NOT RESUSCITATE.  DNR form filled out and provided to the patient on 10/24/2014.  Labs next week with Xgeva: CBC diff, CMET.    Labs every 2 weeks: CBC diff, CMET.  I have refilled her Zofran and [facility].  I have also refilled her Oxycodone with notation that it is to be filled on 03/10/2015 (or later).  She will return in 3 weeks for follow-up with labs.  She knows to call us with any issues.  Sliding Scale: 151-200: 2 units 201-250: 4 units 251- 300: 6 units Greater than 300: 8 units and call MD.

## 2015-03-13 ENCOUNTER — Encounter (HOSPITAL_BASED_OUTPATIENT_CLINIC_OR_DEPARTMENT_OTHER): Payer: Medicare Other

## 2015-03-13 ENCOUNTER — Encounter (HOSPITAL_COMMUNITY): Payer: Medicare Other

## 2015-03-13 VITALS — BP 158/90 | HR 106 | Temp 97.9°F | Resp 20

## 2015-03-13 DIAGNOSIS — C7951 Secondary malignant neoplasm of bone: Secondary | ICD-10-CM

## 2015-03-13 DIAGNOSIS — C50919 Malignant neoplasm of unspecified site of unspecified female breast: Secondary | ICD-10-CM

## 2015-03-13 LAB — CBC WITH DIFFERENTIAL/PLATELET
Basophils Absolute: 0 10*3/uL (ref 0.0–0.1)
Basophils Relative: 0 %
Eosinophils Absolute: 0.2 10*3/uL (ref 0.0–0.7)
Eosinophils Relative: 3 %
HEMATOCRIT: 41.5 % (ref 36.0–46.0)
HEMOGLOBIN: 13.4 g/dL (ref 12.0–15.0)
LYMPHS ABS: 2.7 10*3/uL (ref 0.7–4.0)
LYMPHS PCT: 42 %
MCH: 27.1 pg (ref 26.0–34.0)
MCHC: 32.3 g/dL (ref 30.0–36.0)
MCV: 84 fL (ref 78.0–100.0)
MONO ABS: 0.5 10*3/uL (ref 0.1–1.0)
MONOS PCT: 8 %
NEUTROS ABS: 3 10*3/uL (ref 1.7–7.7)
Neutrophils Relative %: 47 %
Platelets: 186 10*3/uL (ref 150–400)
RBC: 4.94 MIL/uL (ref 3.87–5.11)
RDW: 14.4 % (ref 11.5–15.5)
WBC: 6.5 10*3/uL (ref 4.0–10.5)

## 2015-03-13 LAB — COMPREHENSIVE METABOLIC PANEL
ALBUMIN: 3.4 g/dL — AB (ref 3.5–5.0)
ALK PHOS: 94 U/L (ref 38–126)
ALT: 21 U/L (ref 14–54)
ANION GAP: 9 (ref 5–15)
AST: 26 U/L (ref 15–41)
BUN: 24 mg/dL — ABNORMAL HIGH (ref 6–20)
CHLORIDE: 104 mmol/L (ref 101–111)
CO2: 27 mmol/L (ref 22–32)
Calcium: 9 mg/dL (ref 8.9–10.3)
Creatinine, Ser: 1.29 mg/dL — ABNORMAL HIGH (ref 0.44–1.00)
GFR calc Af Amer: 51 mL/min — ABNORMAL LOW (ref >60)
GFR calc non Af Amer: 44 mL/min — ABNORMAL LOW (ref >60)
GLUCOSE: 249 mg/dL — AB (ref 65–99)
POTASSIUM: 3.9 mmol/L (ref 3.5–5.1)
SODIUM: 140 mmol/L (ref 135–145)
Total Bilirubin: 0.5 mg/dL (ref 0.3–1.2)
Total Protein: 7.2 g/dL (ref 6.5–8.1)

## 2015-03-13 MED ORDER — DENOSUMAB 120 MG/1.7ML ~~LOC~~ SOLN
120.0000 mg | Freq: Once | SUBCUTANEOUS | Status: AC
  Administered 2015-03-13: 120 mg via SUBCUTANEOUS
  Filled 2015-03-13: qty 1.7

## 2015-03-13 NOTE — Patient Instructions (Signed)
[place] at [facility] Discharge Instructions  RECOMMENDATIONS MADE BY THE CONSULTANT AND ANY TEST RESULTS WILL BE SENT TO YOUR REFERRING PHYSICIAN.  Xgeva 120 mg injection given as ordered. Return as scheduled.  Thank you for choosing [place] at [facility] to provide your oncology and hematology care.  To afford each patient quality time with our provider, please arrive at least 15 minutes before your scheduled appointment time.   Beginning January 23rd 2017 lab work for the [facility] will be done in the  Main lab at [facility] on 1st floor. If you have a lab appointment with the [facility] please come in thru the  Main Entrance and check in at the main information desk  You need to re-schedule your appointment should you arrive 10 or more minutes late.  We strive to give you quality time with our providers, and arriving late affects you and other patients whose appointments are after yours.  Also, if you no show three or more times for appointments you may be dismissed from the clinic at the providers discretion.     Again, thank you for choosing [facility].  Our hope is that these requests will decrease the amount of time that you wait before being seen by our physicians.       _____________________________________________________________  Should you have questions after your visit to [facility], please contact our office at [phone] between the hours of 8:30 a.m. and 4:30 p.m.  Voicemails left after 4:30 p.m. will not be returned until the following business day.  For prescription refill requests, have your pharmacy contact our office.

## 2015-03-13 NOTE — Progress Notes (Signed)
Labs reviewed by Dr.Penland. OK to give Xgeva today. Kaylee Mitchell presents today for injection per MD orders. Xgeva 120 mg administered SQ in left Abdomen. Administration without incident. Patient tolerated well.

## 2015-03-24 DIAGNOSIS — E1142 Type 2 diabetes mellitus with diabetic polyneuropathy: Secondary | ICD-10-CM | POA: Diagnosis not present

## 2015-03-24 DIAGNOSIS — L84 Corns and callosities: Secondary | ICD-10-CM | POA: Diagnosis not present

## 2015-03-24 DIAGNOSIS — B351 Tinea unguium: Secondary | ICD-10-CM | POA: Diagnosis not present

## 2015-03-25 MED ORDER — OXYCODONE HCL 10 MG PO TABS: ORAL_TABLET | ORAL | Status: DC

## 2015-03-25 NOTE — Progress Notes (Signed)
Kaylee Bellow, MD [place] [address]  Invasive ductal carcinoma of breast, unspecified laterality ([place])  Bone metastasis ([place]) - Plan: Oxycodone HCl 10 MG TABS  CURRENT THERAPY: Afinitor (5 mg) + Exemestane.  Xgeva monthly.  INTERVAL HISTORY: Kaylee Mitchell 60 y.o. female returns for followup of Stage IV breast cancer, ER/PR+, HER2 neg, with new hepatic metastasis (biopsy proven).    Invasive ductal carcinoma of breast ([place])   05/26/2009 Initial Diagnosis Needle core biopsy showed invasive mammary carcinoma with 1 + lymph node   06/03/2009 Surgery Right modified radical mastectomy with 8/8 + lymph nodes by Dr. Ronnell Freshwater   07/22/2009 Cancer Staging Bone scan + for multiple osseous lesions.  No disease elsewhere.   07/24/2009 - 03/23/2010 Chemotherapy Arimidex   03/23/2010 Progression    03/23/2010 - 12/05/2012 Chemotherapy Tamoxifen   11/28/2012 Progression Worsening of lumbar spine osseous disease   12/06/2012 - 09/25/2014 Chemotherapy Fulvestrant + Exemestane   01/24/2014 PET scan No evidence of hypermetabolic recurrent or metastatic disease. Widespread osseous metastasis, without significant marrow hypermetabolism.   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   10/20/2014 - 11/13/2014 Chemotherapy Afinitor + Exemestane   11/13/2014 Adverse Reaction Increasing fatigue   11/13/2014 Treatment Plan Change Hold Afinitor, will reduce dose to 7.5 mg daily.  Continue Exemestane and Xgeva.   11/24/2014 - 01/16/2015 Chemotherapy Afinitor 7.5 mg daily, Exemestane daily, and continuation of Xgeva.   01/16/2015 Adverse Reaction Cough, ?Afinitor-induced   01/16/2015 - 02/02/2015 Chemotherapy HOLD AFFINITOR secondary to cough, weight loss, Continue EXEMESTANE and Xgeva   01/30/2015 PET scan Significant partial metabolic treatment response in liver metastases, nonenlarged R level 2 cervical LN, non  specific, stable sclerotic osseous metastases throughout skeleton   02/02/2015 Adverse Reaction Weight loss, decreased appetite   02/02/2015 Treatment Plan Change Afinitor dose further reduced to 5 mg.   02/02/2015 -  Chemotherapy Afinitor 5 mg and Exemestane with continuation of Xgeva   I personally reviewed and went over laboratory results with the patient.  The results are noted within this dictation.  Labs will be updated in 2 weeks when she is due for her Xgeva injection.  I personally reviewed and went over radiographic studies with the patient.  The results are noted within this dictation.  I reviewed her PET scan results with her again.  She notes that she is doing better at the reduced dose of Afinitor.  She is taking 5 mg.  She denies any mouth sores.  She notes an improvement in her appetite at the lower dose.  Her weight is stable compared to when it was checked last 4 weeks ago.  She otherwise denies any complaints.  Past Medical History  Diagnosis Date  . Hypertension   . High cholesterol   . Knee pain, bilateral 2013  . Cancer ([place])     rt breast is primary  . Bone metastasis ([place]) 09/13/2010  . Diabetes mellitus     type II  . Depression   . Depression 12/14/2011  . Invasive ductal carcinoma of breast ([place]) 09/13/2010  . Opioid contract exists with [facility] 10/24/2014    Contract found in Chart review in letter tab.  . 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      has No Known Allergies.  Current Outpatient Prescriptions on File Prior to Visit  Medication Sig Dispense Refill  . aspirin EC 81 MG tablet Take 81 mg by mouth daily.      Marland Kitchen atorvastatin (LIPITOR) 20 MG tablet TAKE ONE TABLET BY MOUTH ONCE DAILY 30 tablet 0  . calcium-vitamin D (OSCAL WITH D) 500-200 MG-UNIT per tablet Take 1 tablet by mouth 2 (two) times daily.     . carvedilol (COREG) 12.5 MG tablet TAKE ONE TABLET BY MOUTH TWICE DAILY WITH MEALS 180 tablet 0  . cyclobenzaprine (FLEXERIL) 10 MG tablet Take 1 tablet (10 mg total) by mouth 3 (three) times daily as needed for muscle spasms. 10 tablet 0  . denosumab (XGEVA) 120 MG/1.7ML SOLN injection Inject 120 mg into the skin every 30 (thirty) days.    . diphenhydrAMINE (BENADRYL) 25 MG tablet Take 25 mg by mouth at bedtime as needed.    . docusate sodium (STOOL SOFTENER) 100 MG capsule Take 100 mg by mouth daily as needed for mild constipation.     Marland Kitchen everolimus (AFINITOR) 5 MG tablet Take 1 tablet (5 mg total) by mouth daily. 30 tablet 3  . exemestane (AROMASIN) 25 MG tablet Take 1 tablet (25 mg total) by mouth daily after breakfast. 90 tablet 3  . Insulin Glargine (LANTUS) 100 UNIT/ML Solostar Pen Inject 60 Units into the skin at bedtime.    Marland Kitchen LORazepam (ATIVAN PO) Take 1 tablet by mouth daily as needed (anxiety).     Marland Kitchen losartan-hydrochlorothiazide (HYZAAR) 50-12.5 MG per tablet Take 1 tablet by mouth daily.    . metFORMIN (GLUCOPHAGE) 500 MG tablet Take 500 mg by mouth 2 (two) times daily with a meal.    . ondansetron (ZOFRAN) 8 MG tablet Take 1 tablet (8 mg total) by mouth every 8 (eight) hours as needed for nausea or vomiting. 30 tablet 3  . Polyethylene Glycol 3350 (MIRALAX PO) Take 17 g by mouth daily as needed (constipation).     . potassium chloride SA (K-DUR,KLOR-CON) 20 MEQ tablet Take 1 tablet (20 mEq total) by mouth daily. 90 tablet 4  . sertraline (ZOLOFT) 100 MG tablet Take 100 mg by mouth daily.    Marland Kitchen venlafaxine XR  (EFFEXOR XR) 150 MG 24 hr capsule Take take one capsule at bedtime nightly to control hot flashes. 60 capsule 6  . VENTOLIN HFA 108 (90 BASE) MCG/ACT inhaler Inhale 2 puffs into the lungs 4 (four) times daily as needed for wheezing or shortness of breath. Reported on 02/02/2015  0  . gabapentin (NEURONTIN) 300 MG capsule Take 300 mg by mouth as needed. Reported on 03/26/2015  3  . omeprazole (PRILOSEC) 40 MG capsule TAKE 1 CAPSULE (40 MG TOTAL) BY MOUTH DAILY. (Patient not taking: Reported on 01/16/2015) 30 capsule 3  . [DISCONTINUED] loratadine (CLARITIN) 10 MG tablet Take 10 mg by mouth daily as needed. For allergies     Current Facility-Administered Medications on File Prior to Visit  Medication Dose Route Frequency Provider Last Rate Last Dose  . 1 mL EPINEPHrine 1 mg/mL (1:1000) in 0.9% Normal Saline 3000 mL irrigation    PRN Carole Civil, MD        Past  Surgical History  Procedure Laterality Date  . Knee surgery      right-arthroscopy  . Mastectomy      right side  . Abdominal hysterectomy      Denies any headaches, double vision, fevers, chills, night sweats, vomiting, diarrhea, constipation, chest pain, heart palpitations, shortness of breath, blood in stool, black tarry stool, urinary pain, urinary burning, urinary frequency, hematuria.   PHYSICAL EXAMINATION  ECOG PERFORMANCE STATUS: 1 - Symptomatic but completely ambulatory  Filed Vitals:   03/26/15 1001  BP: 145/74  Pulse: 103  Temp: 98.2 F (36.8 C)  Resp: 18    GENERAL:alert, no distress, well developed, comfortable, cooperative, obese, smiling, unaccompanied SKIN: skin color, texture, turgor are normal, no rashes or significant lesions HEAD: Normocephalic, No masses, lesions, tenderness or abnormalities EYES: normal, PERRLA EARS: External ears normal OROPHARYNX:no exudate, no erythema, lips, buccal mucosa, and tongue normal and mucous membranes are moist.  No sores noted on exam. NECK: supple, trachea  midline LYMPH:  No neck adenopathy BREAST:not examined LUNGS: Clear to auscultation bilaterally without wheezes, rales, or rhonchi. Decreased breath sounds bilaterally poor patient effort.  HEART: Regular rhythm and rate without murmur, rub or gallop.  Normal S1 and S2. ABDOMEN: abdomen soft, non-tender and obese BACK: Back symmetric, no curvature. EXTREMITIES: less then 2 second capillary refill, no joint deformities, effusion, or inflammation, no skin discoloration, no cyanosis  NEURO: alert & oriented x 3 with fluent speech, no focal motor/sensory deficits, gait normal   LABORATORY DATA: CBC    Component Value Date/Time   WBC 6.5 03/13/2015 0811   RBC 4.94 03/13/2015 0811   HGB 13.4 03/13/2015 0811   HCT 41.5 03/13/2015 0811   PLT 186 03/13/2015 0811   MCV 84.0 03/13/2015 0811   MCH 27.1 03/13/2015 0811   MCHC 32.3 03/13/2015 0811   RDW 14.4 03/13/2015 0811   LYMPHSABS 2.7 03/13/2015 0811   MONOABS 0.5 03/13/2015 0811   EOSABS 0.2 03/13/2015 0811   BASOSABS 0.0 03/13/2015 0811      Chemistry      Component Value Date/Time   NA 140 03/13/2015 0811   K 3.9 03/13/2015 0811   CL 104 03/13/2015 0811   CO2 27 03/13/2015 0811   BUN 24* 03/13/2015 0811   CREATININE 1.29* 03/13/2015 0811      Component Value Date/Time   CALCIUM 9.0 03/13/2015 0811   ALKPHOS 94 03/13/2015 0811   AST 26 03/13/2015 0811   ALT 21 03/13/2015 0811   BILITOT 0.5 03/13/2015 0811     Lab Results  Component Value Date   LABCA2 53.0* 01/16/2015      PENDING LABS:   RADIOGRAPHIC STUDIES:  No results found.   PATHOLOGY: As previously noted    ASSESSMENT AND PLAN:  Invasive ductal carcinoma of breast Stage IV Breast cancer with bone involvement requiring Xgeva.  Previously on Faslodex and Aromasin until imaging demonstrated a new hepatic lesion resulting in biopsy proven progression of disease.  Now on Aromasin + Afinitor (5 mg beginning on 02/02/2015, reduced from 7.5 mg).  Response to  therapy noted on PET imaging on 01/30/2015.  Oncology history is up-to-date.  Afinitor dose decreased to 5 mg with continuation of all other treatments beginning on 02/02/2015.  She is tolerating this new reduced dose well without any complaints.  She reports that her appetite is improved.  She continues to lose weight and this will require close surveillance moving forward.  Pain contract in place with [facility].  DO NOT RESUSCITATE.  DNR form filled out and provided to the patient on 10/24/2014.  Xgeva monthly.  Last given on:  Oncology Flowsheet 03/13/2015  denosumab (XGEVA) [place] 120 mg   Labs in 2 weeks: CBC diff, CMET, CA 27.29.  Return in 4 weeks for follow-up.  I have also refilled her Oxycodone with notation that it is to be filled on 04/07/2015 (or later).  She feels better since starting multivitamin by Dr. Karie Kirks.  She will return in 4 weeks for follow-up.  She knows to call us with any issues.  If all is well in 4 weeks, I would like to better coordinate her labs, Xgeva injections, and follow-up appointments.  Sliding Scale: 151-200: 2 units 201-250: 4 units 251- 300: 6 units Greater than 300: 8 units and call MD.   THERAPY PLAN:  Continue Afinitor 5 mg, Exemestane, and Xgeva. Will be mindful of Afinitor side effects including, but not limited to, stomatitis and pneumonitis.    All questions were answered. The patient knows to call the clinic with any problems, questions or concerns. We can certainly see the patient much sooner if necessary.  Patient and plan discussed with Dr. Ancil Linsey and she is in agreement with the aforementioned.   This note is electronically signed by: Doy Mince 03/26/2015 10:56 AM

## 2015-03-25 NOTE — Assessment & Plan Note (Addendum)
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  Previously on Faslodex and Aromasin until imaging demonstrated a new hepatic lesion resulting in biopsy proven progression of disease.  Now on Aromasin + Afinitor (5 mg beginning on 02/02/2015, reduced from 7.5 mg).  Response to therapy noted on PET imaging on 01/30/2015.  Oncology history is up-to-date.  Afinitor dose decreased to 5 mg with continuation of all other treatments beginning on 02/02/2015.  She is tolerating this new reduced dose well without any complaints.  She reports that her appetite is improved.  She continues to lose weight and this will require close surveillance moving forward.  Pain contract in place with [facility]  DO NOT RESUSCITATE.  DNR form filled out and provided to the patient on 10/24/2014.  Xgeva monthly.  Last given on:  Oncology Flowsheet 03/13/2015  denosumab (XGEVA) [place] 120 mg   Labs in 2 weeks: CBC diff, CMET, CA 27.29.  Return in 4 weeks for follow-up.  I have also refilled her Oxycodone with notation that it is to be filled on 04/07/2015 (or later).  She feels better since starting multivitamin by Dr. Karie Kirks.  She will return in 4 weeks for follow-up.  She knows to call us with any issues.  If all is well in 4 weeks, I would like to better coordinate her labs, Xgeva injections, and follow-up appointments.  Sliding Scale: 151-200: 2 units 201-250: 4 units 251- 300: 6 units Greater than 300: 8 units and call MD.

## 2015-03-26 ENCOUNTER — Encounter (HOSPITAL_COMMUNITY): Payer: Medicare Other | Attending: Oncology | Admitting: Oncology

## 2015-03-26 ENCOUNTER — Ambulatory Visit (HOSPITAL_COMMUNITY): Payer: Medicare Other | Admitting: Oncology

## 2015-03-26 ENCOUNTER — Encounter (HOSPITAL_COMMUNITY): Payer: Self-pay | Admitting: Oncology

## 2015-03-26 VITALS — BP 145/74 | HR 103 | Temp 98.2°F | Resp 18 | Wt 272.6 lb

## 2015-03-26 DIAGNOSIS — E1042 Type 1 diabetes mellitus with diabetic polyneuropathy: Secondary | ICD-10-CM | POA: Diagnosis not present

## 2015-03-26 DIAGNOSIS — I10 Essential (primary) hypertension: Secondary | ICD-10-CM | POA: Diagnosis not present

## 2015-03-26 DIAGNOSIS — C7951 Secondary malignant neoplasm of bone: Secondary | ICD-10-CM | POA: Insufficient documentation

## 2015-03-26 DIAGNOSIS — C787 Secondary malignant neoplasm of liver and intrahepatic bile duct: Secondary | ICD-10-CM

## 2015-03-26 DIAGNOSIS — C50919 Malignant neoplasm of unspecified site of unspecified female breast: Secondary | ICD-10-CM | POA: Diagnosis not present

## 2015-03-26 DIAGNOSIS — N189 Chronic kidney disease, unspecified: Secondary | ICD-10-CM | POA: Diagnosis not present

## 2015-03-26 DIAGNOSIS — E785 Hyperlipidemia, unspecified: Secondary | ICD-10-CM | POA: Diagnosis not present

## 2015-03-26 NOTE — Patient Instructions (Signed)
[place] at [facility] Discharge Instructions  RECOMMENDATIONS MADE BY THE CONSULTANT AND ANY TEST RESULTS WILL BE SENT TO YOUR REFERRING PHYSICIAN.  Exam done and seen by Kirby Crigler today Labs in 2weeks Return in 4 weeks for follow up. Gave you a script for you pain medicine. Let us know if you start having mouth soars.  Xgeva in two weeks.  Thank you for choosing [place] at [facility] to provide your oncology and hematology care.  To afford each patient quality time with our provider, please arrive at least 15 minutes before your scheduled appointment time.   Beginning January 23rd 2017 lab work for the [facility] will be done in the  Main lab at [facility] on 1st floor. If you have a lab appointment with the [facility] please come in thru the  Main Entrance and check in at the main information desk  You need to re-schedule your appointment should you arrive 10 or more minutes late.  We strive to give you quality time with our providers, and arriving late affects you and other patients whose appointments are after yours.  Also, if you no show three or more times for appointments you may be dismissed from the clinic at the providers discretion.     Again, thank you for choosing [facility].  Our hope is that these requests will decrease the amount of time that you wait before being seen by our physicians.       _____________________________________________________________  Should you have questions after your visit to [facility], please contact our office at [phone] between the hours of 8:30 a.m. and 4:30 p.m.  Voicemails left after 4:30 p.m. will not be returned until the following business day.  For prescription refill requests, have your pharmacy contact our office.         Resources For Cancer Patients and their Caregivers ? American Cancer Society: Can assist with transportation, wigs,  general needs, runs Look Good Feel Better.        410-408-7179 ? Cancer Care: Provides financial assistance, online support groups, medication/co-pay assistance.  1-800-813-HOPE (617)610-8181) ? [place] Assists [place] cancer patients and their families through emotional , educational and financial support.  (409)844-1812 ? [place] DSS Where to apply for food stamps, Medicaid and utility assistance. 629-603-4254 ? [facility]: Transportation to medical appointments. 219 401 9034 ? Social Security Administration: May apply for disability if have a Stage IV cancer. 772-117-7536 670-194-3745 ? [employer], Disability and Transit Services: Assists with nutrition, care and transit needs. 2131078241

## 2015-03-31 ENCOUNTER — Other Ambulatory Visit (HOSPITAL_COMMUNITY): Payer: Self-pay | Admitting: *Deleted

## 2015-03-31 MED ORDER — OMEPRAZOLE 40 MG PO CPDR: DELAYED_RELEASE_CAPSULE | ORAL | Status: AC

## 2015-04-10 ENCOUNTER — Encounter (HOSPITAL_COMMUNITY): Payer: Self-pay

## 2015-04-10 ENCOUNTER — Encounter (HOSPITAL_COMMUNITY): Payer: Medicare Other | Attending: Hematology & Oncology

## 2015-04-10 ENCOUNTER — Encounter (HOSPITAL_COMMUNITY): Payer: Medicare Other

## 2015-04-10 VITALS — BP 136/64 | HR 96 | Temp 98.6°F | Resp 18

## 2015-04-10 DIAGNOSIS — M25551 Pain in right hip: Secondary | ICD-10-CM

## 2015-04-10 DIAGNOSIS — C50919 Malignant neoplasm of unspecified site of unspecified female breast: Secondary | ICD-10-CM

## 2015-04-10 DIAGNOSIS — C7951 Secondary malignant neoplasm of bone: Secondary | ICD-10-CM

## 2015-04-10 LAB — CBC WITH DIFFERENTIAL/PLATELET
BASOS ABS: 0 10*3/uL (ref 0.0–0.1)
Basophils Relative: 0 %
Eosinophils Absolute: 0.1 10*3/uL (ref 0.0–0.7)
Eosinophils Relative: 2 %
HEMATOCRIT: 43.2 % (ref 36.0–46.0)
HEMOGLOBIN: 13.6 g/dL (ref 12.0–15.0)
LYMPHS ABS: 2.8 10*3/uL (ref 0.7–4.0)
LYMPHS PCT: 48 %
MCH: 26.5 pg (ref 26.0–34.0)
MCHC: 31.5 g/dL (ref 30.0–36.0)
MCV: 84.2 fL (ref 78.0–100.0)
Monocytes Absolute: 0.5 10*3/uL (ref 0.1–1.0)
Monocytes Relative: 8 %
NEUTROS ABS: 2.5 10*3/uL (ref 1.7–7.7)
NEUTROS PCT: 42 %
PLATELETS: 185 10*3/uL (ref 150–400)
RBC: 5.13 MIL/uL — AB (ref 3.87–5.11)
RDW: 14.1 % (ref 11.5–15.5)
WBC: 5.9 10*3/uL (ref 4.0–10.5)

## 2015-04-10 LAB — COMPREHENSIVE METABOLIC PANEL
ALBUMIN: 3.5 g/dL (ref 3.5–5.0)
ALK PHOS: 96 U/L (ref 38–126)
ALT: 23 U/L (ref 14–54)
ANION GAP: 9 (ref 5–15)
AST: 24 U/L (ref 15–41)
BUN: 22 mg/dL — ABNORMAL HIGH (ref 6–20)
CALCIUM: 9 mg/dL (ref 8.9–10.3)
CO2: 25 mmol/L (ref 22–32)
Chloride: 103 mmol/L (ref 101–111)
Creatinine, Ser: 1.35 mg/dL — ABNORMAL HIGH (ref 0.44–1.00)
GFR calc Af Amer: 48 mL/min — ABNORMAL LOW (ref >60)
GFR calc non Af Amer: 41 mL/min — ABNORMAL LOW (ref >60)
GLUCOSE: 321 mg/dL — AB (ref 65–99)
POTASSIUM: 4.1 mmol/L (ref 3.5–5.1)
SODIUM: 137 mmol/L (ref 135–145)
Total Bilirubin: 0.7 mg/dL (ref 0.3–1.2)
Total Protein: 7.3 g/dL (ref 6.5–8.1)

## 2015-04-10 MED ORDER — NAPROXEN 500 MG PO TABS: 500.0000 mg | ORAL_TABLET | Freq: Two times a day (BID) | ORAL | Status: DC

## 2015-04-10 MED ORDER — DENOSUMAB 120 MG/1.7ML ~~LOC~~ SOLN
120.0000 mg | Freq: Once | SUBCUTANEOUS | Status: AC
  Administered 2015-04-10: 120 mg via SUBCUTANEOUS
  Filled 2015-04-10: qty 1.7

## 2015-04-10 NOTE — Progress Notes (Signed)
Kaylee Mitchell presents today for injection per the provider's orders.  xgeva administration without incident; see MAR for injection details.  Patient tolerated procedure well and without incident. Rx for naproxen given for c/o pain in rt hip and leg after falling while squatting to urinate outside.   No questions or complaints noted at this time.

## 2015-04-22 ENCOUNTER — Ambulatory Visit (HOSPITAL_COMMUNITY): Payer: Medicare Other | Admitting: Oncology

## 2015-04-22 MED ORDER — OXYCODONE HCL 10 MG PO TABS: ORAL_TABLET | ORAL | Status: DC

## 2015-04-22 NOTE — Progress Notes (Signed)
NO SHOW

## 2015-04-22 NOTE — Assessment & Plan Note (Deleted)
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  Previously on Faslodex and Aromasin until imaging demonstrated a new hepatic lesion resulting in biopsy proven progression of disease.  Now on Aromasin + Afinitor (5 mg beginning on 02/02/2015, reduced from 7.5 mg).  Response to therapy noted on PET imaging on 01/30/2015.  Oncology history is up-to-date.  Afinitor dose decreased to 5 mg with continuation of all other treatments beginning on 02/02/2015.  She is tolerating this new reduced dose well without any complaints.  She reports that her appetite is improved.  She continues to lose weight and this will require close surveillance moving forward.  Pain contract in place with [facility].  DO NOT RESUSCITATE.  DNR form filled out and provided to the patient on 10/24/2014.  Xgeva monthly.  Last given on:  Oncology Flowsheet 04/10/2015  denosumab (XGEVA) [place] 120 mg    Labs in April 21: CBC diff, CMET, CA 27.29.  Return for follow-up on April 21st with labs and Xegave injection.  I have also refilled her Oxycodone with notation that it is to be filled on 05/05/2015 (or later).  She feels better since starting multivitamin by Dr. Karie Kirks.  Sliding Scale insulin: 151-200: 2 units 201-250: 4 units 251- 300: 6 units Greater than 300: 8 units and call MD.

## 2015-04-23 ENCOUNTER — Ambulatory Visit (HOSPITAL_COMMUNITY): Payer: Medicare Other | Admitting: Hematology & Oncology

## 2015-04-30 ENCOUNTER — Other Ambulatory Visit (HOSPITAL_COMMUNITY): Payer: Self-pay | Admitting: Oncology

## 2015-04-30 DIAGNOSIS — C50919 Malignant neoplasm of unspecified site of unspecified female breast: Secondary | ICD-10-CM

## 2015-04-30 MED ORDER — EVEROLIMUS 5 MG PO TABS: 5.0000 mg | ORAL_TABLET | Freq: Every day | ORAL | Status: DC

## 2015-05-05 DIAGNOSIS — C7951 Secondary malignant neoplasm of bone: Secondary | ICD-10-CM | POA: Diagnosis not present

## 2015-05-05 DIAGNOSIS — C50911 Malignant neoplasm of unspecified site of right female breast: Secondary | ICD-10-CM | POA: Diagnosis not present

## 2015-05-05 DIAGNOSIS — E1065 Type 1 diabetes mellitus with hyperglycemia: Secondary | ICD-10-CM | POA: Diagnosis not present

## 2015-05-05 DIAGNOSIS — E1042 Type 1 diabetes mellitus with diabetic polyneuropathy: Secondary | ICD-10-CM | POA: Diagnosis not present

## 2015-05-07 NOTE — Assessment & Plan Note (Addendum)
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  Previously on Faslodex and Aromasin until imaging demonstrated a new hepatic lesion resulting in biopsy proven progression of disease.  Now on Aromasin + Afinitor (5 mg beginning on 02/02/2015, reduced from 7.5 mg).  Response to therapy noted on PET imaging on 01/30/2015.  Oncology history is up-to-date.  Afinitor dose decreased to 5 mg with continuation of all other treatments beginning on 02/02/2015.  She is tolerating this new reduced dose well without any complaints.  She reports that her appetite is stable but she continues to lose weight. She admits to a good appetite but taste of foods limited her ability to eat. I will consult Burtis Junes, RD, for further recommendation(s).  She is down to 266 pounds today. She notes that she is on a multivitamin that was prescribed by her primary care physician, Dr. Karie Kirks, and she notes that this has helped her overall well-being. She is encouraged to continue with this multivitamin.  Pain contract in place with [facility].  Oxycodone as refilled today.  DO NOT RESUSCITATE.  DNR form filled out and provided to the patient on 10/24/2014.  Xgeva monthly.  Last given on:  Oncology Flowsheet 04/10/2015  denosumab (XGEVA) [place] 120 mg   Delton See is due to today.  Corrected calcium is 9.3 today.  Hyperglycemia is noted today and the patient refuses insulin in the clinic today. She notes that she will take 6 units of insulin at home when she returns.  Hypokalemia is noted today at 3.4. I contacted [facility] and they confirm that she is not taking potassium. I will prescribe 20 mEq of Kdur daily 30 days.  She is due for repeat imaging and does not want to go to [facility] to have a PET scan. She asks if we can do different imaging here. I'll get her set up for CT scans of chest, abdomen, and pelvis with IV contrast. She refuses oral contrast as this causes vomiting and inability to  consume necessary oral contrast media. She is advised that based upon CT imaging results, we may need to pursue PET imaging. She is agreeable to this in an attempt to save her a trip to [place].  Labs in 4 weeks: CBC diff, CMET, CA 27.29.  Return in 4 weeks for follow-up, review of data, labs, and denosumab injection.  Sliding Scale: 151-200: 2 units 201-250: 4 units 251- 300: 6 units Greater than 300: 8 units and call MD.

## 2015-05-07 NOTE — Progress Notes (Signed)
[name], MD [place] [address]  Invasive ductal carcinoma of breast, unspecified laterality ([place]) - Plan: CT Abdomen Pelvis W Contrast, CT Chest W Contrast  Bone metastasis ([place]) - Plan: Oxycodone HCl 10 MG TABS, CT Abdomen Pelvis W Contrast, CT Chest W Contrast, SCHEDULING COMMUNICATION, denosumab (XGEVA) injection 120 mg  Diuretic-induced hypokalemia - Plan: potassium chloride SA (K-DUR,KLOR-CON) 20 MEQ tablet  CURRENT THERAPY: Afinitor (5 mg) + Exemestane.  Xgeva monthly.  INTERVAL HISTORY: Kaylee Mitchell 60 y.o. female returns for followup of Stage IV breast cancer, ER/PR+, HER2 neg, with new hepatic metastasis (biopsy proven).    Invasive ductal carcinoma of breast ([place])   05/26/2009 Initial Diagnosis Needle core biopsy showed invasive mammary carcinoma with 1 + lymph node   06/03/2009 Surgery Right modified radical mastectomy with 8/8 + lymph nodes by Dr. Ronnell Freshwater   07/22/2009 Cancer Staging Bone scan + for multiple osseous lesions.  No disease elsewhere.   07/24/2009 - 03/23/2010 Chemotherapy Arimidex   03/23/2010 Progression    03/23/2010 - 12/05/2012 Chemotherapy Tamoxifen   11/28/2012 Progression Worsening of lumbar spine osseous disease   12/06/2012 - 09/25/2014 Chemotherapy Fulvestrant + Exemestane   01/24/2014 PET scan No evidence of hypermetabolic recurrent or metastatic disease. Widespread osseous metastasis, without significant marrow hypermetabolism.   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   10/20/2014 - 11/13/2014 Chemotherapy Afinitor + Exemestane   11/13/2014 Adverse Reaction Increasing fatigue   11/13/2014 Treatment Plan Change Hold Afinitor, will reduce dose to 7.5 mg daily.  Continue Exemestane and Xgeva.   11/24/2014 - 01/16/2015 Chemotherapy Afinitor 7.5 mg daily, Exemestane daily, and continuation of Xgeva.   01/16/2015 Adverse Reaction Cough,  ?Afinitor-induced   01/16/2015 - 02/02/2015 Chemotherapy HOLD AFFINITOR secondary to cough, weight loss, Continue EXEMESTANE and Xgeva   01/30/2015 PET scan Significant partial metabolic treatment response in liver metastases, nonenlarged R level 2 cervical LN, non specific, stable sclerotic osseous metastases throughout skeleton   02/02/2015 Adverse Reaction Weight loss, decreased appetite   02/02/2015 Treatment Plan Change Afinitor dose further reduced to 5 mg.   02/02/2015 -  Chemotherapy Afinitor 5 mg and Exemestane with continuation of Xgeva   I personally reviewed and went over laboratory results with the patient.  The results are noted within this dictation.  Labs are updated today. Hyperglycemia and hypokalemia are noted. These are addressed below. Criteria for denosumab injection.  She continues to tolerate Afinitor at reduced dose of 5 mg. She denies any mouth sores. She denies any shortness of breath or new cough.  Her weight continues to decline. She is down to 266 pounds today. This is compared to 294 pounds in October 2016. She notes that she has an appetite but the taste of foods are unappealing to her. She relays that this is the result of her weight loss.  She notes that she feels well. She has no specific complaints.  She otherwise denies any complaints.  Past Medical History  Diagnosis Date  . Hypertension   . High cholesterol   . Knee pain, bilateral 2013  . Cancer ([place])     rt breast is primary  . Bone metastasis ([place]) 09/13/2010  . Diabetes mellitus     type II  . Depression   . Depression 12/14/2011  . Invasive ductal carcinoma of breast ([place]) 09/13/2010  . Opioid contract exists with [facility] 10/24/2014  Contract found in Chart review in letter tab.  . 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     has No Known Allergies.  Current Outpatient Prescriptions on File Prior to Visit  Medication Sig Dispense Refill  . aspirin EC 81 MG tablet Take 81 mg by mouth daily.      Marland Kitchen atorvastatin (LIPITOR) 20 MG tablet TAKE ONE TABLET BY MOUTH ONCE DAILY 30 tablet 0  . calcium-vitamin D (OSCAL WITH D) 500-200 MG-UNIT per tablet Take 1 tablet by mouth 2 (two) times daily.     . carvedilol (COREG) 12.5 MG tablet TAKE ONE TABLET BY MOUTH TWICE DAILY WITH MEALS 180 tablet 0  . denosumab (XGEVA) 120 MG/1.7ML SOLN injection Inject 120 mg into the skin every 30 (thirty) days.    . diphenhydrAMINE (BENADRYL) 25 MG tablet Take 25 mg by mouth at bedtime as needed.    . docusate sodium (STOOL SOFTENER) 100 MG capsule Take 100 mg by mouth daily as needed for mild constipation.     Marland Kitchen everolimus (AFINITOR) 5 MG tablet Take 1 tablet (5 mg total) by mouth daily. 30 tablet 3  . exemestane (AROMASIN) 25 MG tablet Take 1 tablet (25 mg total) by mouth daily after breakfast. 90 tablet 3  . gabapentin (NEURONTIN) 300 MG capsule Take 300 mg by mouth as needed. Reported on 03/26/2015  3  . Insulin Glargine (LANTUS) 100 UNIT/ML Solostar Pen Inject 60 Units into the skin at bedtime.    Marland Kitchen LORazepam (ATIVAN PO) Take 1 tablet by mouth daily as needed (anxiety).     Marland Kitchen losartan-hydrochlorothiazide (HYZAAR) 50-12.5 MG per tablet Take 1 tablet by mouth daily.    . metFORMIN (GLUCOPHAGE) 500 MG tablet Take 500 mg by mouth 2 (two) times daily with a meal.    . naproxen (NAPROSYN) 500 MG tablet Take 1 tablet (500 mg total) by mouth 2 (two) times daily with a meal. 45 tablet 0  . omeprazole  (PRILOSEC) 40 MG capsule TAKE 1 CAPSULE (40 MG TOTAL) BY MOUTH DAILY. 30 capsule 6  . sertraline (ZOLOFT) 100 MG tablet Take 100 mg by mouth daily.    Marland Kitchen venlafaxine XR (EFFEXOR XR) 150 MG 24 hr capsule Take take one capsule at bedtime nightly to control hot flashes. 60 capsule 6  . VENTOLIN HFA 108 (90 BASE) MCG/ACT inhaler Inhale 2 puffs into the lungs 4 (four) times daily as needed for wheezing or shortness of breath. Reported on 02/02/2015  0  . ondansetron (ZOFRAN) 8 MG tablet Take 1 tablet (8 mg total) by mouth every 8 (eight) hours as needed for nausea or vomiting. (Patient not taking: Reported on 05/08/2015) 30 tablet 3  . Polyethylene Glycol 3350 (MIRALAX PO) Take 17 g by mouth daily as needed (constipation). Reported on 05/08/2015    . [DISCONTINUED] loratadine (CLARITIN) 10 MG tablet Take 10 mg by mouth daily as needed. For allergies     Current Facility-Administered Medications on File Prior to Visit  Medication Dose Route Frequency Provider Last Rate Last Dose  . 1 mL EPINEPHrine 1 mg/mL (1:1000) in  0.9% Normal Saline 3000 mL irrigation    PRN Carole Civil, MD        Past Surgical History  Procedure Laterality Date  . Knee surgery      right-arthroscopy  . Mastectomy      right side  . Abdominal hysterectomy      Denies any headaches, double vision, fevers, chills, night sweats, vomiting, diarrhea, constipation, chest pain, heart palpitations, shortness of breath, blood in stool, black tarry stool, urinary pain, urinary burning, urinary frequency, hematuria.   PHYSICAL EXAMINATION  ECOG PERFORMANCE STATUS: 1 - Symptomatic but completely ambulatory  Filed Vitals:   05/08/15 0823  BP: 147/90  Pulse: 113  Temp: 98.4 F (36.9 C)  Resp: 18    GENERAL:alert, no distress, well developed, comfortable, cooperative, obese, smiling, unaccompanied SKIN: skin color, texture, turgor are normal, no rashes or significant lesions HEAD: Normocephalic, No masses, lesions,  tenderness or abnormalities EYES: normal, PERRLA EARS: External ears normal OROPHARYNX:no exudate, no erythema, lips, buccal mucosa, and tongue normal and mucous membranes are moist.  No sores noted on exam. NECK: supple, trachea midline LYMPH:  No neck adenopathy BREAST:not examined LUNGS: Clear to auscultation bilaterally without wheezes, rales, or rhonchi.  HEART: Regular rhythm and rate without murmur, rub or gallop.  Normal S1 and S2. ABDOMEN: abdomen soft, non-tender and obese BACK: Back symmetric, no curvature. EXTREMITIES: less then 2 second capillary refill, no joint deformities, effusion, or inflammation, no skin discoloration, no cyanosis  NEURO: alert & oriented x 3 with fluent speech, no focal motor/sensory deficits, gait normal   LABORATORY DATA: CBC    Component Value Date/Time   WBC 4.8 05/08/2015 0759   RBC 4.99 05/08/2015 0759   HGB 13.2 05/08/2015 0759   HCT 40.9 05/08/2015 0759   PLT 186 05/08/2015 0759   MCV 82.0 05/08/2015 0759   MCH 26.5 05/08/2015 0759   MCHC 32.3 05/08/2015 0759   RDW 14.0 05/08/2015 0759   LYMPHSABS 2.3 05/08/2015 0759   MONOABS 0.4 05/08/2015 0759   EOSABS 0.1 05/08/2015 0759   BASOSABS 0.0 05/08/2015 0759      Chemistry      Component Value Date/Time   NA 140 05/08/2015 0759   K 3.4* 05/08/2015 0759   CL 105 05/08/2015 0759   CO2 27 05/08/2015 0759   BUN 15 05/08/2015 0759   CREATININE 1.18* 05/08/2015 0759      Component Value Date/Time   CALCIUM 8.8* 05/08/2015 0759   ALKPHOS 78 05/08/2015 0759   AST 30 05/08/2015 0759   ALT 28 05/08/2015 0759   BILITOT 0.6 05/08/2015 0759     Lab Results  Component Value Date   LABCA2 53.0* 01/16/2015      PENDING LABS:   RADIOGRAPHIC STUDIES:  No results found.   PATHOLOGY: As previously noted    ASSESSMENT AND PLAN:  Invasive ductal carcinoma of breast Stage IV Breast cancer with bone involvement requiring Xgeva.  Previously on Faslodex and Aromasin until imaging  demonstrated a new hepatic lesion resulting in biopsy proven progression of disease.  Now on Aromasin + Afinitor (5 mg beginning on 02/02/2015, reduced from 7.5 mg).  Response to therapy noted on PET imaging on 01/30/2015.  Oncology history is up-to-date.  Afinitor dose decreased to 5 mg with continuation of all other treatments beginning on 02/02/2015.  She is tolerating this new reduced dose well without any complaints.  She reports that her appetite is stable but she continues to lose weight. She admits to a  good appetite but taste of foods limited her ability to eat. I will consult Burtis Junes, RD, for further recommendation(s).  She is down to 266 pounds today. She notes that she is on a multivitamin that was prescribed by her primary care physician, Dr. Karie Kirks, and she notes that this has helped her overall well-being. She is encouraged to continue with this multivitamin.  Pain contract in place with [facility].  Oxycodone as refilled today.  DO NOT RESUSCITATE.  DNR form filled out and provided to the patient on 10/24/2014.  Xgeva monthly.  Last given on:  Oncology Flowsheet 04/10/2015  denosumab (XGEVA) [place] 120 mg   Delton See is due to today.  Corrected calcium is 9.3 today.  Hyperglycemia is noted today and the patient refuses insulin in the clinic today. She notes that she will take 6 units of insulin at home when she returns.  Hypokalemia is noted today at 3.4. I contacted [facility] and they confirm that she is not taking potassium. I will prescribe 20 mEq of Kdur daily 30 days.  She is due for repeat imaging and does not want to go to [facility] to have a PET scan. She asks if we can do different imaging here. I'll get her set up for CT scans of chest, abdomen, and pelvis with IV contrast. She refuses oral contrast as this causes vomiting and inability to consume necessary oral contrast media. She is advised that based upon CT imaging results, we may need to pursue PET  imaging. She is agreeable to this in an attempt to save her a trip to [place].  Labs in 4 weeks: CBC diff, CMET, CA 27.29.  Return in 4 weeks for follow-up, review of data, labs, and denosumab injection.  Sliding Scale: 151-200: 2 units 201-250: 4 units 251- 300: 6 units Greater than 300: 8 units and call MD.   THERAPY PLAN:  Continue Afinitor 5 mg, Exemestane, and Xgeva. Will be mindful of Afinitor side effects including, but not limited to, stomatitis and pneumonitis.    All questions were answered. The patient knows to call the clinic with any problems, questions or concerns. We can certainly see the patient much sooner if necessary.  Patient and plan discussed with Dr. Ancil Linsey and she is in agreement with the aforementioned.   This note is electronically signed by: Doy Mince 05/08/2015 9:10 AM

## 2015-05-08 ENCOUNTER — Encounter (HOSPITAL_COMMUNITY): Payer: Medicare Other | Attending: Oncology

## 2015-05-08 ENCOUNTER — Encounter (HOSPITAL_COMMUNITY): Payer: Medicare Other

## 2015-05-08 ENCOUNTER — Encounter (HOSPITAL_BASED_OUTPATIENT_CLINIC_OR_DEPARTMENT_OTHER): Payer: Medicare Other | Admitting: Oncology

## 2015-05-08 ENCOUNTER — Encounter (HOSPITAL_COMMUNITY): Payer: Self-pay | Admitting: Oncology

## 2015-05-08 ENCOUNTER — Ambulatory Visit (HOSPITAL_COMMUNITY): Payer: Medicare Other | Admitting: Hematology & Oncology

## 2015-05-08 VITALS — BP 147/90 | HR 113 | Temp 98.4°F | Resp 18 | Wt 266.0 lb

## 2015-05-08 DIAGNOSIS — C50919 Malignant neoplasm of unspecified site of unspecified female breast: Secondary | ICD-10-CM

## 2015-05-08 DIAGNOSIS — C787 Secondary malignant neoplasm of liver and intrahepatic bile duct: Secondary | ICD-10-CM | POA: Diagnosis not present

## 2015-05-08 DIAGNOSIS — E876 Hypokalemia: Secondary | ICD-10-CM | POA: Diagnosis not present

## 2015-05-08 DIAGNOSIS — T502X5A Adverse effect of carbonic-anhydrase inhibitors, benzothiadiazides and other diuretics, initial encounter: Secondary | ICD-10-CM

## 2015-05-08 DIAGNOSIS — C7951 Secondary malignant neoplasm of bone: Secondary | ICD-10-CM | POA: Insufficient documentation

## 2015-05-08 LAB — CBC WITH DIFFERENTIAL/PLATELET
BASOS ABS: 0 10*3/uL (ref 0.0–0.1)
Basophils Relative: 1 %
EOS ABS: 0.1 10*3/uL (ref 0.0–0.7)
Eosinophils Relative: 2 %
HCT: 40.9 % (ref 36.0–46.0)
HEMOGLOBIN: 13.2 g/dL (ref 12.0–15.0)
LYMPHS ABS: 2.3 10*3/uL (ref 0.7–4.0)
Lymphocytes Relative: 47 %
MCH: 26.5 pg (ref 26.0–34.0)
MCHC: 32.3 g/dL (ref 30.0–36.0)
MCV: 82 fL (ref 78.0–100.0)
Monocytes Absolute: 0.4 10*3/uL (ref 0.1–1.0)
Monocytes Relative: 7 %
NEUTROS PCT: 43 %
Neutro Abs: 2.1 10*3/uL (ref 1.7–7.7)
Platelets: 186 10*3/uL (ref 150–400)
RBC: 4.99 MIL/uL (ref 3.87–5.11)
RDW: 14 % (ref 11.5–15.5)
WBC: 4.8 10*3/uL (ref 4.0–10.5)

## 2015-05-08 LAB — COMPREHENSIVE METABOLIC PANEL
ALT: 28 U/L (ref 14–54)
AST: 30 U/L (ref 15–41)
Albumin: 3.4 g/dL — ABNORMAL LOW (ref 3.5–5.0)
Alkaline Phosphatase: 78 U/L (ref 38–126)
Anion gap: 8 (ref 5–15)
BUN: 15 mg/dL (ref 6–20)
CHLORIDE: 105 mmol/L (ref 101–111)
CO2: 27 mmol/L (ref 22–32)
CREATININE: 1.18 mg/dL — AB (ref 0.44–1.00)
Calcium: 8.8 mg/dL — ABNORMAL LOW (ref 8.9–10.3)
GFR, EST AFRICAN AMERICAN: 56 mL/min — AB (ref >60)
GFR, EST NON AFRICAN AMERICAN: 49 mL/min — AB (ref >60)
Glucose, Bld: 212 mg/dL — ABNORMAL HIGH (ref 65–99)
POTASSIUM: 3.4 mmol/L — AB (ref 3.5–5.1)
Sodium: 140 mmol/L (ref 135–145)
Total Bilirubin: 0.6 mg/dL (ref 0.3–1.2)
Total Protein: 7.1 g/dL (ref 6.5–8.1)

## 2015-05-08 MED ORDER — OXYCODONE HCL 10 MG PO TABS: ORAL_TABLET | ORAL | Status: DC

## 2015-05-08 MED ORDER — POTASSIUM CHLORIDE CRYS ER 20 MEQ PO TBCR: 20.0000 meq | EXTENDED_RELEASE_TABLET | Freq: Every day | ORAL | Status: DC

## 2015-05-08 MED ORDER — DENOSUMAB 120 MG/1.7ML ~~LOC~~ SOLN
120.0000 mg | Freq: Once | SUBCUTANEOUS | Status: AC
Start: 2015-05-08 — End: 2015-05-08
  Administered 2015-05-08: 120 mg via SUBCUTANEOUS
  Filled 2015-05-08: qty 1.7

## 2015-05-08 NOTE — Progress Notes (Signed)
Kaylee Mitchell presents today for injection per MD orders. Xgeva 120 mg administered SQ in left Abdomen. Administration without incident. Patient tolerated well.  

## 2015-05-08 NOTE — Patient Instructions (Signed)
[place] at [facility] Discharge Instructions  RECOMMENDATIONS MADE BY THE CONSULTANT AND ANY TEST RESULTS WILL BE SENT TO YOUR REFERRING PHYSICIAN.  xgeva today Labs and xgeva every 4 weeks CT in 3 weeks Referral to Burtis Junes for weight loss Oxycodone refill today You are to take 6 units of insulin when you get home Tom escribed kdur to your drug store Return in 4 weeks  Thank you for choosing [facility] to provide your oncology and hematology care.  To afford each patient quality time with our provider, please arrive at least 15 minutes before your scheduled appointment time.   Beginning January 23rd 2017 lab work for the [facility] will be done in the  Main lab at [facility] on 1st floor. If you have a lab appointment with the [facility] please come in thru the  Main Entrance and check in at the main information desk  You need to re-schedule your appointment should you arrive 10 or more minutes late.  We strive to give you quality time with our providers, and arriving late affects you and other patients whose appointments are after yours.  Also, if you no show three or more times for appointments you may be dismissed from the clinic at the providers discretion.     Again, thank you for choosing [facility].  Our hope is that these requests will decrease the amount of time that you wait before being seen by our physicians.       _____________________________________________________________  Should you have questions after your visit to [facility], please contact our office at [phone] between the hours of 8:30 a.m. and 4:30 p.m.  Voicemails left after 4:30 p.m. will not be returned until the following business day.  For prescription refill requests, have your pharmacy contact our office.         Resources For Cancer Patients and their Caregivers ? American Cancer Society: Can  assist with transportation, wigs, general needs, runs Look Good Feel Better.        416 178 6244 ? Cancer Care: Provides financial assistance, online support groups, medication/co-pay assistance.  1-800-813-HOPE (915) 053-7633) ? [facility] Assists [place] cancer patients and their families through emotional , educational and financial support.  304 199 6856 ? [place] DSS Where to apply for food stamps, Medicaid and utility assistance. (740)098-0814 ? [facility]: Transportation to medical appointments. 787-366-7294 ? Social Security Administration: May apply for disability if have a Stage IV cancer. 269-489-1249 [phone] ? [employer], Disability and Transit Services: Assists with nutrition, care and transit needs. 973-672-6058

## 2015-05-09 LAB — CANCER ANTIGEN 27.29: CA 27.29: 44.4 U/mL — AB (ref 0.0–38.6)

## 2015-06-03 ENCOUNTER — Ambulatory Visit (HOSPITAL_COMMUNITY)
Admission: RE | Admit: 2015-06-03 | Discharge: 2015-06-03 | Disposition: A | Discharge status: Home / Self Care | Payer: Medicare Other | Source: Ambulatory Visit | Attending: Oncology | Admitting: Oncology

## 2015-06-03 DIAGNOSIS — I272 Other secondary pulmonary hypertension: Secondary | ICD-10-CM | POA: Insufficient documentation

## 2015-06-03 DIAGNOSIS — C787 Secondary malignant neoplasm of liver and intrahepatic bile duct: Secondary | ICD-10-CM | POA: Diagnosis not present

## 2015-06-03 DIAGNOSIS — I2510 Atherosclerotic heart disease of native coronary artery without angina pectoris: Secondary | ICD-10-CM | POA: Insufficient documentation

## 2015-06-03 DIAGNOSIS — C7951 Secondary malignant neoplasm of bone: Secondary | ICD-10-CM | POA: Diagnosis present

## 2015-06-03 DIAGNOSIS — C50919 Malignant neoplasm of unspecified site of unspecified female breast: Secondary | ICD-10-CM | POA: Diagnosis not present

## 2015-06-03 MED ORDER — IOPAMIDOL (ISOVUE-300) INJECTION 61%
100.0000 mL | Freq: Once | INTRAVENOUS | Status: AC | PRN
  Administered 2015-06-03: 100 mL via INTRAVENOUS

## 2015-06-04 NOTE — Progress Notes (Signed)
[name], MD [place] [address]  Invasive ductal carcinoma of breast, unspecified laterality ([place]) - Plan: CBC with Differential, Cancer antigen 27.29, Cancer antigen 15-3  Bone metastasis (HCC) - Plan: Oxycodone HCl 10 MG TABS  Decreased appetite - Plan: megestrol (MEGACE) 400 MG/10ML suspension  Weight loss - Plan: megestrol (MEGACE) 400 MG/10ML suspension  CURRENT THERAPY: Afinitor (5 mg) + Exemestane. Xgeva monthly.  INTERVAL HISTORY: Kaylee Mitchell 60 y.o. female returns for followup of Stage IV breast cancer, ER/PR+, HER2 neg, with new hepatic metastasis (biopsy proven).    Invasive ductal carcinoma of breast ([place])   05/26/2009 Initial Diagnosis Needle core biopsy showed invasive mammary carcinoma with 1 + lymph node   06/03/2009 Surgery Right modified radical mastectomy with 8/8 + lymph nodes by Dr. Ronnell Freshwater   07/22/2009 Cancer Staging Bone scan + for multiple osseous lesions.  No disease elsewhere.   07/24/2009 - 03/23/2010 Chemotherapy Arimidex   03/23/2010 Progression    03/23/2010 - 12/05/2012 Chemotherapy Tamoxifen   11/28/2012 Progression Worsening of lumbar spine osseous disease   12/06/2012 - 09/25/2014 Chemotherapy Fulvestrant + Exemestane   01/24/2014 PET scan No evidence of hypermetabolic recurrent or metastatic disease. Widespread osseous metastasis, without significant marrow hypermetabolism.   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   10/20/2014 - 11/13/2014 Chemotherapy Afinitor + Exemestane   11/13/2014 Adverse Reaction Increasing fatigue   11/13/2014 Treatment Plan Change Hold Afinitor, will reduce dose to 7.5 mg daily.  Continue Exemestane and Xgeva.   11/24/2014 - 01/16/2015 Chemotherapy Afinitor 7.5 mg daily, Exemestane daily, and continuation of Xgeva.   01/16/2015 Adverse Reaction Cough, ?Afinitor-induced   01/16/2015 - 02/02/2015 Chemotherapy HOLD  AFFINITOR secondary to cough, weight loss, Continue EXEMESTANE and Xgeva   01/30/2015 PET scan Significant partial metabolic treatment response in liver metastases, nonenlarged R level 2 cervical LN, non specific, stable sclerotic osseous metastases throughout skeleton   02/02/2015 Adverse Reaction Weight loss, decreased appetite   02/02/2015 Treatment Plan Change Afinitor dose further reduced to 5 mg.   02/02/2015 -  Chemotherapy Afinitor 5 mg and Exemestane with continuation of Xgeva   06/03/2015 Imaging CT CAP- Hepatic metastatic disease appears to have improved slightly in the interval from 01/30/2015. Diffuse osseous metastatic disease.    I personally reviewed and went over laboratory results with the patient.  The results are noted within this dictation.Her labs meet parameters for Xgeva today.  I personally reviewed and went over radiographic studies with the patient.  The results are noted within this dictation.  CT imaging on 06/03/2015 demonstrates a positive response to therapy.  She met with Burtis Junes, RD, today.  We've discussed the patient's case. He is made some recommendations for increased caloric intake. We will also start a appetite stimulant.   Weight loss continues to be an issue.  Review of Systems  Constitutional: Positive for weight loss. Negative for fever and chills.  HENT: Negative.   Eyes: Negative.  Negative for blurred vision and double vision.  Respiratory: Negative.  Negative for cough, hemoptysis and shortness of breath.   Cardiovascular: Negative.  Negative for chest pain.  Gastrointestinal: Positive for nausea and abdominal pain. Negative for vomiting, diarrhea and constipation.  Genitourinary: Negative.  Negative for dysuria and frequency.  Musculoskeletal: Negative.  Negative for falls.  Skin: Negative.   Neurological: Negative.  Negative for dizziness, sensory change, speech change, focal weakness, seizures,  loss of consciousness and headaches.    Endo/Heme/Allergies: Negative.   Psychiatric/Behavioral: Negative.     Past Medical History  Diagnosis Date  . Hypertension   . High cholesterol   . Knee pain, bilateral 2013  . Cancer ([place])     rt breast is primary  . Bone metastasis ([place]) 09/13/2010  . Diabetes mellitus     type II  . Depression   . Depression 12/14/2011  . Invasive ductal carcinoma of breast ([place]) 09/13/2010  . Opioid contract exists with [facility] 10/24/2014    Contract found in Chart review in letter tab.  . DNR (do not resuscitate) 11/05/2014    Past Surgical History  Procedure Laterality Date  . Knee surgery      right-arthroscopy  . Mastectomy      right side  . Abdominal hysterectomy      Family History  Problem Relation Age of Onset  . Arthritis    . Cancer    . Diabetes    . Anesthesia problems Neg Hx   . Hypotension Neg Hx   . Malignant hyperthermia Neg Hx   . Pseudochol deficiency Neg Hx   . Diabetes Mother   . Diabetes Father   . Cancer Maternal Aunt     Social History   Social History  . Marital Status: Single    Spouse Name: N/A  . Number of Children: N/A  . Years of Education: 12   Occupational History  . disabled    Social History Main Topics  . Smoking status: Never Smoker   . Smokeless tobacco: Never Used  . Alcohol Use: No  . Drug Use: No  . Sexual Activity: Yes    Birth Control/ Protection: Surgical   Other Topics Concern  . Not on file   Social History Narrative     PHYSICAL EXAMINATION  ECOG PERFORMANCE STATUS: 1 - Symptomatic but completely ambulatory  Filed Vitals:   06/05/15 0834  BP: 141/61  Pulse: 90  Temp: 98.7 F (37.1 C)  Resp: 16    GENERAL:alert, no distress, comfortable, cooperative, obese, smiling and unaccompanied. SKIN: skin color, texture, turgor are normal, no rashes or significant lesions HEAD: Normocephalic, No masses, lesions, tenderness or abnormalities EYES: normal, Conjunctiva are pink and  non-injected EARS: External ears normal OROPHARYNX:lips, buccal mucosa, and tongue normal and mucous membranes are moist  NECK: supple, trachea midline LYMPH:  no palpable lymphadenopathy BREAST:not examined LUNGS: clear to auscultation and percussion HEART: regular rate & rhythm, no murmurs, no gallops, S1 normal and S2 normal ABDOMEN:abdomen soft, non-tender, obese and normal bowel sounds BACK: Back symmetric, no curvature. EXTREMITIES:less then 2 second capillary refill, no joint deformities, effusion, or inflammation, no skin discoloration, no cyanosis  NEURO: alert & oriented x 3 with fluent speech, no focal motor/sensory deficits, gait normal   LABORATORY DATA: CBC    Component Value Date/Time   WBC 4.8 05/08/2015 0759   RBC 4.99 05/08/2015 0759   HGB 13.2 05/08/2015 0759   HCT 40.9 05/08/2015 0759   PLT 186 05/08/2015 0759   MCV 82.0 05/08/2015 0759   MCH 26.5 05/08/2015 0759   MCHC 32.3 05/08/2015 0759   RDW 14.0 05/08/2015 0759   LYMPHSABS 2.3 05/08/2015 0759   MONOABS 0.4 05/08/2015 0759   EOSABS 0.1 05/08/2015 0759   BASOSABS 0.0 05/08/2015 0759      Chemistry      Component Value Date/Time   NA 134* 06/05/2015 0748   K 4.5 06/05/2015 0748  CL 99* 06/05/2015 0748   CO2 27 06/05/2015 0748   BUN 23* 06/05/2015 0748   CREATININE 1.43* 06/05/2015 0748      Component Value Date/Time   CALCIUM 10.2 06/05/2015 0748   ALKPHOS 113 06/05/2015 0748   AST 41 06/05/2015 0748   ALT 32 06/05/2015 0748   BILITOT 0.6 06/05/2015 0748        PENDING LABS:   RADIOGRAPHIC STUDIES:  Ct Chest W Contrast  06/03/2015  CLINICAL DATA:  Stage IV breast cancer with bone metastases. Abdominal pain. EXAM: CT CHEST, ABDOMEN, AND PELVIS WITH CONTRAST TECHNIQUE: Multidetector CT imaging of the chest, abdomen and pelvis was performed following the standard protocol during bolus administration of intravenous contrast. CONTRAST:  183m ISOVUE-300 IOPAMIDOL (ISOVUE-300) INJECTION  61% COMPARISON:  PET 01/30/2015. FINDINGS: CT CHEST FINDINGS Mediastinum/Lymph Nodes: No pathologically enlarged mediastinal, hilar, internal mammary or axillary lymph nodes. Surgical clips in the right axilla. Pulmonary arteries are enlarged. Heart size normal. Coronary artery calcification. No pericardial effusion. Tiny hiatal hernia. Lungs/Pleura: Calcified granuloma in the left lower lobe. Lungs are otherwise clear. No pleural fluid. Airway is unremarkable. Musculoskeletal: Sclerotic lesions are seen throughout the visualized osseous structures. No pathologic fracture. CT ABDOMEN PELVIS FINDINGS Hepatobiliary: There are scattered somewhat ill-defined low-attenuation lesions in the liver, measuring up to 2.1 cm in the right hepatic lobe, largest of which appears grossly stable. Other lesions are less visible than on 01/30/2015. Gallbladder is unremarkable. No biliary ductal dilatation. Pancreas: Negative. Spleen: Negative. Adrenals/Urinary Tract: Adrenal gland and kidneys are unremarkable. Ureters are decompressed. Bladder is unremarkable. Stomach/Bowel: Tiny hiatal hernia. Stomach, small bowel, appendix and colon are unremarkable. Vascular/Lymphatic: Scattered atherosclerotic calcification of the arterial vasculature without abdominal aortic aneurysm. No pathologically enlarged lymph nodes. Reproductive: Hysterectomy.  Ovaries are visualized. Other: No free fluid.  Mesenteries and peritoneum are unremarkable. Musculoskeletal: Sclerotic lesions are seen throughout the osseous structures. IMPRESSION: 1. Hepatic metastatic disease appears to have improved slightly in the interval from 01/30/2015. 2. Diffuse osseous metastatic disease. 3. Coronary artery calcification. 4. Enlarged pulmonary arteries, indicative of pulmonary arterial hypertension. Electronically Signed   By: MLorin PicketM.D.   On: 06/03/2015 12:55   Ct Abdomen Pelvis W Contrast  06/03/2015  CLINICAL DATA:  Stage IV breast cancer with bone  metastases. Abdominal pain. EXAM: CT CHEST, ABDOMEN, AND PELVIS WITH CONTRAST TECHNIQUE: Multidetector CT imaging of the chest, abdomen and pelvis was performed following the standard protocol during bolus administration of intravenous contrast. CONTRAST:  1040mISOVUE-300 IOPAMIDOL (ISOVUE-300) INJECTION 61% COMPARISON:  PET 01/30/2015. FINDINGS: CT CHEST FINDINGS Mediastinum/Lymph Nodes: No pathologically enlarged mediastinal, hilar, internal mammary or axillary lymph nodes. Surgical clips in the right axilla. Pulmonary arteries are enlarged. Heart size normal. Coronary artery calcification. No pericardial effusion. Tiny hiatal hernia. Lungs/Pleura: Calcified granuloma in the left lower lobe. Lungs are otherwise clear. No pleural fluid. Airway is unremarkable. Musculoskeletal: Sclerotic lesions are seen throughout the visualized osseous structures. No pathologic fracture. CT ABDOMEN PELVIS FINDINGS Hepatobiliary: There are scattered somewhat ill-defined low-attenuation lesions in the liver, measuring up to 2.1 cm in the right hepatic lobe, largest of which appears grossly stable. Other lesions are less visible than on 01/30/2015. Gallbladder is unremarkable. No biliary ductal dilatation. Pancreas: Negative. Spleen: Negative. Adrenals/Urinary Tract: Adrenal gland and kidneys are unremarkable. Ureters are decompressed. Bladder is unremarkable. Stomach/Bowel: Tiny hiatal hernia. Stomach, small bowel, appendix and colon are unremarkable. Vascular/Lymphatic: Scattered atherosclerotic calcification of the arterial vasculature without abdominal aortic aneurysm. No pathologically enlarged lymph nodes. Reproductive:  Hysterectomy.  Ovaries are visualized. Other: No free fluid.  Mesenteries and peritoneum are unremarkable. Musculoskeletal: Sclerotic lesions are seen throughout the osseous structures. IMPRESSION: 1. Hepatic metastatic disease appears to have improved slightly in the interval from 01/30/2015. 2. Diffuse  osseous metastatic disease. 3. Coronary artery calcification. 4. Enlarged pulmonary arteries, indicative of pulmonary arterial hypertension. Electronically Signed   By: Lorin Picket M.D.   On: 06/03/2015 12:55     PATHOLOGY:    ASSESSMENT AND PLAN:  Invasive ductal carcinoma of breast Stage IV Breast cancer with bone involvement requiring Xgeva.  Previously on Faslodex and Aromasin until imaging demonstrated a new hepatic lesion resulting in biopsy proven progression of disease.  Now on Aromasin + Afinitor (5 mg beginning on 02/02/2015, reduced from 7.5 mg).  Response to therapy noted on PET imaging on 01/30/2015.  Oncology history is updated.  CT scan reviewed in detail which demonstrates a positive response to therapy on 06/03/2015.  Pain contract in place with [facility].  Oxycodone as refilled today.  DO NOT RESUSCITATE.  DNR form filled out and provided to the patient on 10/24/2014.  Xgeva monthly.  Last given on:  Oncology Flowsheet 05/08/2015  denosumab (XGEVA) [place] 120 mg    Delton See is due to today.  Calcium is WNL.  Weight continues to decline.  She is seen by Burtis Junes, RD today.  She notes that she only has 1 meal per day, around 2 PM.  Ovid Curd has made a number of suggestions regarding her caloric intake.  She notes a decrease in appetite but when she is hungry, she eat well.  She does not like Ensure/Boost.  Nate has recommended adding lemon juice to her meals, consuming whole milk, etc.  Ovid Curd and I have discussed the possibility of adding Megace to her medication list.  I think this is a reasonable intervention.  Therefore, Megace is prescribed.  She is educated on the medication, including the increased risk of VTE.  She remains active and mobile at home.  Labs in 4 weeks: CBC diff, CMET, CA 27.29.  Return in 4 weeks for follow-up, labs, and denosumab injection.  Sliding Scale: 151-200: 2 units 201-250: 4 units 251- 300: 6 units Greater than 300: 8  units and call MD.    ORDERS PLACED FOR THIS ENCOUNTER: Orders Placed This Encounter  Procedures  . CBC with Differential  . Cancer antigen 27.29  . Cancer antigen 15-3    MEDICATIONS PRESCRIBED THIS ENCOUNTER: Meds ordered this encounter  Medications  . Oxycodone HCl 10 MG TABS    Sig: Take 1 tablet every 4 hours as needed for pain.    Dispense:  100 tablet    Refill:  0    Order Specific Question:  Supervising Provider    Answer:  Patrici Ranks [id number]  . megestrol (MEGACE) 400 MG/10ML suspension    Sig: Take 20 mLs (800 mg total) by mouth daily.    Dispense:  240 mL    Refill:  2    Order Specific Question:  Supervising Provider    Answer:  Patrici Ranks [id number]    THERAPY PLAN:  Continue Afinitor 5 mg, Exemestane, and Xgeva. Will be mindful of Afinitor side effects including, but not limited to, stomatitis and pneumonitis.   All questions were answered. The patient knows to call the clinic with any problems, questions or concerns. We can certainly see the patient much sooner if necessary.  Patient and plan discussed with Dr. Larene Beach  [place] and she is in agreement with the aforementioned.   This note is electronically signed by: Doy Mince 06/05/2015 8:58 AM

## 2015-06-04 NOTE — Assessment & Plan Note (Addendum)
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  Previously on Faslodex and Aromasin until imaging demonstrated a new hepatic lesion resulting in biopsy proven progression of disease.  Now on Aromasin + Afinitor (5 mg beginning on 02/02/2015, reduced from 7.5 mg).  Response to therapy noted on PET imaging on 01/30/2015.  Oncology history is updated.  CT scan reviewed in detail which demonstrates a positive response to therapy on 06/03/2015.  Pain contract in [facility] with [facility].  Oxycodone as refilled today.  DO NOT RESUSCITATE.  DNR form filled out and provided to the patient on 10/24/2014.  Xgeva monthly.  Last given on:  Oncology Flowsheet 05/08/2015  denosumab (XGEVA) [place] 120 mg    Delton See is due to today.  Calcium is WNL.  Weight continues to decline.  She is seen by Burtis Junes, RD today.  She notes that she only has 1 meal per day, around 2 PM.  Ovid Curd has made a number of suggestions regarding her caloric intake.  She notes a decrease in appetite but when she is hungry, she eat well.  She does not like Ensure/Boost.  Nate has recommended adding lemon juice to her meals, consuming whole milk, etc.  Ovid Curd and I have discussed the possibility of adding Megace to her medication list.  I think this is a reasonable intervention.  Therefore, Megace is prescribed.  She is educated on the medication, including the increased risk of VTE.  She remains active and mobile at home.  Labs in 4 weeks: CBC diff, CMET, CA 27.29.  Return in 4 weeks for follow-up, labs, and denosumab injection.  Sliding Scale: 151-200: 2 units 201-250: 4 units 251- 300: 6 units Greater than 300: 8 units and call MD.

## 2015-06-05 ENCOUNTER — Encounter (HOSPITAL_COMMUNITY): Payer: Medicare Other

## 2015-06-05 ENCOUNTER — Encounter (HOSPITAL_COMMUNITY): Payer: Medicare Other | Attending: Hematology & Oncology

## 2015-06-05 ENCOUNTER — Encounter: Payer: Self-pay | Admitting: Dietician

## 2015-06-05 ENCOUNTER — Encounter (HOSPITAL_COMMUNITY): Payer: Medicare Other | Attending: Oncology | Admitting: Oncology

## 2015-06-05 VITALS — BP 141/61 | HR 90 | Temp 98.7°F | Resp 16 | Ht 65.0 in | Wt 263.9 lb

## 2015-06-05 DIAGNOSIS — C7951 Secondary malignant neoplasm of bone: Secondary | ICD-10-CM

## 2015-06-05 DIAGNOSIS — R630 Anorexia: Secondary | ICD-10-CM

## 2015-06-05 DIAGNOSIS — C50919 Malignant neoplasm of unspecified site of unspecified female breast: Secondary | ICD-10-CM

## 2015-06-05 DIAGNOSIS — R634 Abnormal weight loss: Secondary | ICD-10-CM

## 2015-06-05 LAB — COMPREHENSIVE METABOLIC PANEL
ALT: 32 U/L (ref 14–54)
ANION GAP: 8 (ref 5–15)
AST: 41 U/L (ref 15–41)
Albumin: 3.5 g/dL (ref 3.5–5.0)
Alkaline Phosphatase: 113 U/L (ref 38–126)
BUN: 23 mg/dL — ABNORMAL HIGH (ref 6–20)
CHLORIDE: 99 mmol/L — AB (ref 101–111)
CO2: 27 mmol/L (ref 22–32)
Calcium: 10.2 mg/dL (ref 8.9–10.3)
Creatinine, Ser: 1.43 mg/dL — ABNORMAL HIGH (ref 0.44–1.00)
GFR, EST AFRICAN AMERICAN: 45 mL/min — AB (ref >60)
GFR, EST NON AFRICAN AMERICAN: 39 mL/min — AB (ref >60)
Glucose, Bld: 398 mg/dL — ABNORMAL HIGH (ref 65–99)
POTASSIUM: 4.5 mmol/L (ref 3.5–5.1)
Sodium: 134 mmol/L — ABNORMAL LOW (ref 135–145)
Total Bilirubin: 0.6 mg/dL (ref 0.3–1.2)
Total Protein: 7.5 g/dL (ref 6.5–8.1)

## 2015-06-05 MED ORDER — OXYCODONE HCL 10 MG PO TABS: ORAL_TABLET | ORAL | Status: DC

## 2015-06-05 MED ORDER — MEGESTROL ACETATE 400 MG/10ML PO SUSP: 800.0000 mg | Freq: Every day | ORAL | Status: DC

## 2015-06-05 MED ORDER — DENOSUMAB 120 MG/1.7ML ~~LOC~~ SOLN
120.0000 mg | Freq: Once | SUBCUTANEOUS | Status: AC
  Administered 2015-06-05: 120 mg via SUBCUTANEOUS
  Filled 2015-06-05: qty 1.7

## 2015-06-05 NOTE — Progress Notes (Signed)
Patient identified by PA as suffering from further wt loss.   Contacted Pt by Visiting during her appointment   Wt Readings from Last 10 Encounters:  06/05/15 263 lb 14.4 oz (119.704 kg)  05/08/15 266 lb (120.657 kg)  03/26/15 272 lb 9.6 oz (123.651 kg)  03/05/15 272 lb (123.378 kg)  02/02/15 275 lb 9.6 oz (125.011 kg)  01/16/15 277 lb 11.2 oz (125.964 kg)  01/02/15 279 lb 14.4 oz (126.962 kg)  12/19/14 282 lb 4.8 oz (128.05 kg)  12/05/14 283 lb 6.4 oz (128.549 kg)  11/24/14 284 lb 8 oz (129.048 kg)   Patient weight has decreased by >15 lbs since RD saw her 5 months ago.  Patient reports oral intake as Very poor and is still very much suffering from severe taste changes. She has noted an appetite, but has limited intake due to these changes.   Pt's eating routine is as follows. She will eat her first "meal" at 2 pm in the afternoon. She notes she is not able to finish this meal before the taste changes completely eliminate her appetite. She will then eat fruit intermittently the rest of the day. She does not eat a dinner because she doesn't have any desire to eat. She will occasionally eat PB/Cheese Crackers.   Pt says the taste is akin to that of poison: "It tastes like poison". Bitterness seems to be the most frequent unpleasant taste. RD re-educated on ways to combat taste changes: maintaining good oral care, using spices/lemon/sugar to season foods (not doing) etc.  RD discussed the importance of eating a meal before bed and much earlier in the day to decrease time fasting. She takes insulin and checks her BG. She notes it does drop overnight to ~ 100, this is new and likely related to poor intake. She says that her crackers and milk still taste good to her. She thought she would be able to drink a glass of milk and a pack of cheese/PB crackers in the morning and before bed. This should increase her intake by atleast 500 kcals/day and decrease risk of hypoglycemia at night  Briefly  discussed making the most of what she does eat by adding extra toppings/dips to her foods. Emphasized never eating a single food by itself.   She says her stomach cannot tolerate Regular Boost/Ensure. Encouraged her to try clears, though acknowledged these will elevated her BG.   She is excited to announce she has stopped drinking soda. This in itself may be a reason for her weight loss vs her disease/treament. She drinks tea and water now. Again, emphasized calorie containing beverages such as the milk.   Interestingly, she says she has an appetite and there ARE foods that taste good, but she wont eat them. Suspect that there is an aspect of poor appetite and do recommend an appetite stimulant. PA in agreement to starting megace.   Pt is very pleasant and was receptive to all recommendations.  Left my contact info coupons+handouts titled "Making the most of each bite" and "taste/smell changes".  Burtis Junes RD, LDN Nutrition Pager: [phone] 06/05/2015 9:15 AM

## 2015-06-05 NOTE — Progress Notes (Signed)
Kaylee Mitchell presents today for injection per MD orders. Xgeva administered SQ in left Abdomen. Administration without incident. Patient tolerated well.

## 2015-06-05 NOTE — Patient Instructions (Addendum)
[place] at [facility] Discharge Instructions  RECOMMENDATIONS MADE BY THE CONSULTANT AND ANY TEST RESULTS WILL BE SENT TO YOUR REFERRING PHYSICIAN.  Your CT scans are improved. You met with Ovid Curd today and he made some dietary suggestions for your continued weight loss. We will try an appetite stimulant. You labs are good today. You will get your Xgeva injection today. We will repeat labs in 4 weeks and see you back in 4 weeks for follow-up Continue with your "chemo-pills." Call us with any issues.  Thank you for choosing [place] at [facility] to provide your oncology and hematology care.  To afford each patient quality time with our provider, please arrive at least 15 minutes before your scheduled appointment time.   Beginning January 23rd 2017 lab work for the [facility] will be done in the  Main lab at [facility] on 1st floor. If you have a lab appointment with the [facility] please come in thru the  Main Entrance and check in at the main information desk  You need to re-schedule your appointment should you arrive 10 or more minutes late.  We strive to give you quality time with our providers, and arriving late affects you and other patients whose appointments are after yours.  Also, if you no show three or more times for appointments you may be dismissed from the clinic at the providers discretion.     Again, thank you for choosing [facility].  Our hope is that these requests will decrease the amount of time that you wait before being seen by our physicians.       _____________________________________________________________  Should you have questions after your visit to [facility], please contact our office at [phone] between the hours of 8:30 a.m. and 4:30 p.m.  Voicemails left after 4:30 p.m. will not be returned until the following business day.  For prescription refill requests, have your  pharmacy contact our office.         Resources For Cancer Patients and their Caregivers ? American Cancer Society: Can assist with transportation, wigs, general needs, runs Look Good Feel Better.        249-177-0641 ? Cancer Care: Provides financial assistance, online support groups, medication/co-pay assistance.  1-800-813-HOPE 509-281-6955) ? [facility] Assists [place] cancer patients and their families through emotional , educational and financial support.  910-264-5783 ? [place] DSS Where to apply for food stamps, Medicaid and utility assistance. 779-577-6721 ? [facility]: Transportation to medical appointments. 413-539-1503 ? Social Security Administration: May apply for disability if have a Stage IV cancer. 618 429 3046 970-690-6802 ? [employer], Disability and Transit Services: Assists with nutrition, care and transit needs. [place] Support Programs: @10RELATIVEDAYS @ > Cancer Support Group  2nd Tuesday of the month 1pm-2pm, Journey Room  > Creative Journey  3rd Tuesday of the month 1130am-1pm, Journey Room  > Look Good Feel Better  1st Wednesday of the month 10am-12 noon, Journey Room (Call [place] to register [phone])

## 2015-06-24 DIAGNOSIS — E1042 Type 1 diabetes mellitus with diabetic polyneuropathy: Secondary | ICD-10-CM | POA: Diagnosis not present

## 2015-06-24 DIAGNOSIS — G4700 Insomnia, unspecified: Secondary | ICD-10-CM | POA: Diagnosis not present

## 2015-06-24 DIAGNOSIS — E1065 Type 1 diabetes mellitus with hyperglycemia: Secondary | ICD-10-CM | POA: Diagnosis not present

## 2015-06-26 DIAGNOSIS — E1042 Type 1 diabetes mellitus with diabetic polyneuropathy: Secondary | ICD-10-CM | POA: Diagnosis not present

## 2015-06-26 DIAGNOSIS — I10 Essential (primary) hypertension: Secondary | ICD-10-CM | POA: Diagnosis not present

## 2015-06-26 DIAGNOSIS — E785 Hyperlipidemia, unspecified: Secondary | ICD-10-CM | POA: Diagnosis not present

## 2015-07-03 ENCOUNTER — Encounter (HOSPITAL_BASED_OUTPATIENT_CLINIC_OR_DEPARTMENT_OTHER): Payer: Medicare Other | Admitting: Oncology

## 2015-07-03 ENCOUNTER — Encounter (HOSPITAL_COMMUNITY): Payer: Medicare Other | Attending: Oncology

## 2015-07-03 ENCOUNTER — Encounter (HOSPITAL_COMMUNITY): Payer: Medicare Other | Attending: Hematology & Oncology

## 2015-07-03 VITALS — BP 158/85 | HR 105 | Temp 98.4°F | Resp 16 | Wt 256.0 lb

## 2015-07-03 DIAGNOSIS — C50919 Malignant neoplasm of unspecified site of unspecified female breast: Secondary | ICD-10-CM | POA: Diagnosis not present

## 2015-07-03 DIAGNOSIS — C7951 Secondary malignant neoplasm of bone: Secondary | ICD-10-CM | POA: Insufficient documentation

## 2015-07-03 LAB — CBC WITH DIFFERENTIAL/PLATELET
Basophils Absolute: 0 10*3/uL (ref 0.0–0.1)
Basophils Relative: 1 %
EOS ABS: 0.1 10*3/uL (ref 0.0–0.7)
EOS PCT: 1 %
HCT: 38.3 % (ref 36.0–46.0)
Hemoglobin: 12.5 g/dL (ref 12.0–15.0)
LYMPHS ABS: 2.2 10*3/uL (ref 0.7–4.0)
LYMPHS PCT: 44 %
MCH: 26.2 pg (ref 26.0–34.0)
MCHC: 32.6 g/dL (ref 30.0–36.0)
MCV: 80.3 fL (ref 78.0–100.0)
MONOS PCT: 5 %
Monocytes Absolute: 0.2 10*3/uL (ref 0.1–1.0)
Neutro Abs: 2.4 10*3/uL (ref 1.7–7.7)
Neutrophils Relative %: 49 %
PLATELETS: 190 10*3/uL (ref 150–400)
RBC: 4.77 MIL/uL (ref 3.87–5.11)
RDW: 14.3 % (ref 11.5–15.5)
WBC: 4.9 10*3/uL (ref 4.0–10.5)

## 2015-07-03 LAB — COMPREHENSIVE METABOLIC PANEL
ALBUMIN: 3.6 g/dL (ref 3.5–5.0)
ALT: 23 U/L (ref 14–54)
AST: 26 U/L (ref 15–41)
Alkaline Phosphatase: 80 U/L (ref 38–126)
Anion gap: 7 (ref 5–15)
BUN: 20 mg/dL (ref 6–20)
CHLORIDE: 108 mmol/L (ref 101–111)
CO2: 22 mmol/L (ref 22–32)
CREATININE: 1.24 mg/dL — AB (ref 0.44–1.00)
Calcium: 8.9 mg/dL (ref 8.9–10.3)
GFR calc Af Amer: 53 mL/min — ABNORMAL LOW (ref >60)
GFR calc non Af Amer: 46 mL/min — ABNORMAL LOW (ref >60)
GLUCOSE: 221 mg/dL — AB (ref 65–99)
POTASSIUM: 3.6 mmol/L (ref 3.5–5.1)
SODIUM: 137 mmol/L (ref 135–145)
Total Bilirubin: 0.7 mg/dL (ref 0.3–1.2)
Total Protein: 7.2 g/dL (ref 6.5–8.1)

## 2015-07-03 MED ORDER — DENOSUMAB 120 MG/1.7ML ~~LOC~~ SOLN
120.0000 mg | Freq: Once | SUBCUTANEOUS | Status: AC
  Administered 2015-07-03: 120 mg via SUBCUTANEOUS
  Filled 2015-07-03: qty 1.7

## 2015-07-03 MED ORDER — OXYCODONE HCL 10 MG PO TABS: ORAL_TABLET | ORAL | Status: DC

## 2015-07-03 NOTE — Progress Notes (Signed)
[name], MD [place] [address]  Invasive ductal carcinoma of breast, unspecified laterality ([place])  Bone metastasis ([place]) - Plan: Oxycodone HCl 10 MG TABS  CURRENT THERAPY: Afinitor (5 mg) + Exemestane. Xgeva monthly.  INTERVAL HISTORY: Kaylee Mitchell 60 y.o. female returns for followup of Stage IV breast cancer, ER/PR+, HER2 neg, with new hepatic metastasis (biopsy proven).    Invasive ductal carcinoma of breast ([place])   05/26/2009 Initial Diagnosis Needle core biopsy showed invasive mammary carcinoma with 1 + lymph node   06/03/2009 Surgery Right modified radical mastectomy with 8/8 + lymph nodes by Dr. Ronnell Freshwater   07/22/2009 Cancer Staging Bone scan + for multiple osseous lesions.  No disease elsewhere.   07/24/2009 - 03/23/2010 Chemotherapy Arimidex   03/23/2010 Progression    03/23/2010 - 12/05/2012 Chemotherapy Tamoxifen   11/28/2012 Progression Worsening of lumbar spine osseous disease   12/06/2012 - 09/25/2014 Chemotherapy Fulvestrant + Exemestane   01/24/2014 PET scan No evidence of hypermetabolic recurrent or metastatic disease. Widespread osseous metastasis, without significant marrow hypermetabolism.   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   10/20/2014 - 11/13/2014 Chemotherapy Afinitor + Exemestane   11/13/2014 Adverse Reaction Increasing fatigue   11/13/2014 Treatment Plan Change Hold Afinitor, will reduce dose to 7.5 mg daily.  Continue Exemestane and Xgeva.   11/24/2014 - 01/16/2015 Chemotherapy Afinitor 7.5 mg daily, Exemestane daily, and continuation of Xgeva.   01/16/2015 Adverse Reaction Cough, ?Afinitor-induced   01/16/2015 - 02/02/2015 Chemotherapy HOLD AFFINITOR secondary to cough, weight loss, Continue EXEMESTANE and Xgeva   01/30/2015 PET scan Significant partial metabolic treatment response in liver metastases, nonenlarged R level 2 cervical LN, non  specific, stable sclerotic osseous metastases throughout skeleton   02/02/2015 Adverse Reaction Weight loss, decreased appetite   02/02/2015 Treatment Plan Change Afinitor dose further reduced to 5 mg.   02/02/2015 -  Chemotherapy Afinitor 5 mg and Exemestane with continuation of Xgeva   06/03/2015 Imaging CT CAP- Hepatic metastatic disease appears to have improved slightly in the interval from 01/30/2015. Diffuse osseous metastatic disease.   "I feel great!"  She denies any complaints.  She is taking Megace and she enjoys the taste of the medication.  "It's lemony."    Her pain is well controlled and she denies any new complaints.  We went over her eating pattern.  She never ate breakfast.  She eats lunch and dinner only.    Her weight continues to decline.  She had to buy new clothes as a result.   Review of Systems  Constitutional: Positive for weight loss. Negative for fever and chills.  HENT: Negative.   Eyes: Negative.  Negative for blurred vision and double vision.  Respiratory: Negative.  Negative for cough, hemoptysis and shortness of breath.   Cardiovascular: Negative.  Negative for chest pain.  Gastrointestinal: Negative for nausea, vomiting, abdominal pain, diarrhea and constipation.  Genitourinary: Negative.  Negative for dysuria and frequency.  Musculoskeletal: Negative.  Negative for falls.  Skin: Negative.   Neurological: Negative.  Negative for dizziness, sensory change, speech change, focal weakness, seizures, loss of consciousness and headaches.  Endo/Heme/Allergies: Negative.   Psychiatric/Behavioral: Negative.     Past Medical History  Diagnosis Date  . Hypertension   . High cholesterol   . Knee pain, bilateral 2013  . Cancer ([place])     rt breast is primary  . Bone metastasis (  [place]) 09/13/2010  . Diabetes mellitus     type II  . Depression   . Depression 12/14/2011  . Invasive ductal carcinoma of breast ([place]) 09/13/2010  . Opioid contract exists with [facility] 10/24/2014    Contract found in Chart review in letter tab.  . DNR (do not resuscitate) 11/05/2014    Past Surgical History  Procedure Laterality Date  . Knee surgery      right-arthroscopy  . Mastectomy      right side  . Abdominal hysterectomy      Family History  Problem Relation Age of Onset  . Arthritis    . Cancer    . Diabetes    . Anesthesia problems Neg Hx   . Hypotension Neg Hx   . Malignant hyperthermia Neg Hx   . Pseudochol deficiency Neg Hx   . Diabetes Mother   . Diabetes Father   . Cancer Maternal Aunt     Social History   Social History  . Marital Status: Single    Spouse Name: N/A  . Number of Children: N/A  . Years of Education: 12   Occupational History  . disabled    Social History Main Topics  . Smoking status: Never Smoker   . Smokeless tobacco: Never Used  . Alcohol Use: No  . Drug Use: No  . Sexual Activity: Yes    Birth Control/ Protection: Surgical   Other Topics Concern  . Not on file   Social History Narrative     PHYSICAL EXAMINATION  ECOG PERFORMANCE STATUS: 1 - Symptomatic but completely ambulatory  Filed Vitals:   07/03/15 0828  BP: 158/85  Pulse: 105  Temp: 98.4 F (36.9 C)  Resp: 16    GENERAL:alert, no distress, comfortable, cooperative, obese, smiling and unaccompanied. SKIN: skin color, texture, turgor are normal, no rashes or significant lesions HEAD: Normocephalic, No masses, lesions, tenderness or abnormalities EYES: normal, Conjunctiva are pink and non-injected EARS: External ears normal OROPHARYNX:lips, buccal mucosa, and tongue normal and mucous membranes are moist  NECK: supple, trachea midline LYMPH:  no palpable lymphadenopathy BREAST:not examined LUNGS: clear to auscultation and percussion HEART: regular rate & rhythm, no murmurs, no gallops, S1 normal and S2 normal ABDOMEN:abdomen soft, non-tender, obese and normal bowel sounds BACK: Back symmetric, no  curvature. EXTREMITIES:less then 2 second capillary refill, no joint deformities, effusion, or inflammation, no skin discoloration, no cyanosis  NEURO: alert & oriented x 3 with fluent speech, no focal motor/sensory deficits, gait normal   LABORATORY DATA: CBC    Component Value Date/Time   WBC 4.9 07/03/2015 0741   RBC 4.77 07/03/2015 0741   HGB 12.5 07/03/2015 0741   HCT 38.3 07/03/2015 0741   PLT 190 07/03/2015 0741   MCV 80.3 07/03/2015 0741   MCH 26.2 07/03/2015 0741   MCHC 32.6 07/03/2015 0741   RDW 14.3 07/03/2015 0741   LYMPHSABS 2.2 07/03/2015 0741   MONOABS 0.2 07/03/2015 0741   EOSABS 0.1 07/03/2015 0741   BASOSABS 0.0 07/03/2015 0741      Chemistry      Component Value Date/Time   NA 137 07/03/2015 0741   K 3.6 07/03/2015 0741   CL 108 07/03/2015 0741   CO2 22 07/03/2015 0741   BUN 20 07/03/2015 0741   CREATININE 1.24* 07/03/2015 0741      Component Value Date/Time   CALCIUM 8.9 07/03/2015 0741   ALKPHOS 80 07/03/2015 0741   AST 26 07/03/2015 0741   ALT 23  07/03/2015 0741   BILITOT 0.7 07/03/2015 0741        PENDING LABS:   RADIOGRAPHIC STUDIES:  Ct Chest W Contrast  06/03/2015  CLINICAL DATA:  Stage IV breast cancer with bone metastases. Abdominal pain. EXAM: CT CHEST, ABDOMEN, AND PELVIS WITH CONTRAST TECHNIQUE: Multidetector CT imaging of the chest, abdomen and pelvis was performed following the standard protocol during bolus administration of intravenous contrast. CONTRAST:  174m ISOVUE-300 IOPAMIDOL (ISOVUE-300) INJECTION 61% COMPARISON:  PET 01/30/2015. FINDINGS: CT CHEST FINDINGS Mediastinum/Lymph Nodes: No pathologically enlarged mediastinal, hilar, internal mammary or axillary lymph nodes. Surgical clips in the right axilla. Pulmonary arteries are enlarged. Heart size normal. Coronary artery calcification. No pericardial effusion. Tiny hiatal hernia. Lungs/Pleura: Calcified granuloma in the left lower lobe. Lungs are otherwise clear. No pleural  fluid. Airway is unremarkable. Musculoskeletal: Sclerotic lesions are seen throughout the visualized osseous structures. No pathologic fracture. CT ABDOMEN PELVIS FINDINGS Hepatobiliary: There are scattered somewhat ill-defined low-attenuation lesions in the liver, measuring up to 2.1 cm in the right hepatic lobe, largest of which appears grossly stable. Other lesions are less visible than on 01/30/2015. Gallbladder is unremarkable. No biliary ductal dilatation. Pancreas: Negative. Spleen: Negative. Adrenals/Urinary Tract: Adrenal gland and kidneys are unremarkable. Ureters are decompressed. Bladder is unremarkable. Stomach/Bowel: Tiny hiatal hernia. Stomach, small bowel, appendix and colon are unremarkable. Vascular/Lymphatic: Scattered atherosclerotic calcification of the arterial vasculature without abdominal aortic aneurysm. No pathologically enlarged lymph nodes. Reproductive: Hysterectomy.  Ovaries are visualized. Other: No free fluid.  Mesenteries and peritoneum are unremarkable. Musculoskeletal: Sclerotic lesions are seen throughout the osseous structures. IMPRESSION: 1. Hepatic metastatic disease appears to have improved slightly in the interval from 01/30/2015. 2. Diffuse osseous metastatic disease. 3. Coronary artery calcification. 4. Enlarged pulmonary arteries, indicative of pulmonary arterial hypertension. Electronically Signed   By: MLorin PicketM.D.   On: 06/03/2015 12:55   Ct Abdomen Pelvis W Contrast  06/03/2015  CLINICAL DATA:  Stage IV breast cancer with bone metastases. Abdominal pain. EXAM: CT CHEST, ABDOMEN, AND PELVIS WITH CONTRAST TECHNIQUE: Multidetector CT imaging of the chest, abdomen and pelvis was performed following the standard protocol during bolus administration of intravenous contrast. CONTRAST:  1079mISOVUE-300 IOPAMIDOL (ISOVUE-300) INJECTION 61% COMPARISON:  PET 01/30/2015. FINDINGS: CT CHEST FINDINGS Mediastinum/Lymph Nodes: No pathologically enlarged mediastinal, hilar,  internal mammary or axillary lymph nodes. Surgical clips in the right axilla. Pulmonary arteries are enlarged. Heart size normal. Coronary artery calcification. No pericardial effusion. Tiny hiatal hernia. Lungs/Pleura: Calcified granuloma in the left lower lobe. Lungs are otherwise clear. No pleural fluid. Airway is unremarkable. Musculoskeletal: Sclerotic lesions are seen throughout the visualized osseous structures. No pathologic fracture. CT ABDOMEN PELVIS FINDINGS Hepatobiliary: There are scattered somewhat ill-defined low-attenuation lesions in the liver, measuring up to 2.1 cm in the right hepatic lobe, largest of which appears grossly stable. Other lesions are less visible than on 01/30/2015. Gallbladder is unremarkable. No biliary ductal dilatation. Pancreas: Negative. Spleen: Negative. Adrenals/Urinary Tract: Adrenal gland and kidneys are unremarkable. Ureters are decompressed. Bladder is unremarkable. Stomach/Bowel: Tiny hiatal hernia. Stomach, small bowel, appendix and colon are unremarkable. Vascular/Lymphatic: Scattered atherosclerotic calcification of the arterial vasculature without abdominal aortic aneurysm. No pathologically enlarged lymph nodes. Reproductive: Hysterectomy.  Ovaries are visualized. Other: No free fluid.  Mesenteries and peritoneum are unremarkable. Musculoskeletal: Sclerotic lesions are seen throughout the osseous structures. IMPRESSION: 1. Hepatic metastatic disease appears to have improved slightly in the interval from 01/30/2015. 2. Diffuse osseous metastatic disease. 3. Coronary artery calcification. 4. Enlarged pulmonary arteries,  indicative of pulmonary arterial hypertension. Electronically Signed   By: Lorin Picket M.D.   On: 06/03/2015 12:55     PATHOLOGY:    ASSESSMENT AND PLAN:  Invasive ductal carcinoma of breast Stage IV Breast cancer with bone involvement requiring Xgeva.  Previously on Faslodex and Aromasin until imaging demonstrated a new hepatic lesion  resulting in biopsy proven progression of disease.  Now on Aromasin + Afinitor (5 mg beginning on 02/02/2015, reduced from 7.5 mg).  Response to therapy noted on PET imaging on 01/30/2015.  Oncology history is up to date.  Labs today: CBC diff, CMET, CA27.29, and CA15-3.  I personally reviewed and went over laboratory results with the patient.  The results are noted within this dictation.  Calcium is WNL today.  Lower limits of normal.  She is compliant with Ca++ and Vit D she reports.  This is encouraged.  Hyperglycemia is noted today at 221.  She will take 8 units of insulin when she gets home.  Pain contract in place with [facility].  Oxycodone as refilled today.  DO NOT RESUSCITATE.  DNR form filled out and provided to the patient on 10/24/2014.  Xgeva monthly.  Last given on:  Oncology Flowsheet 06/05/2015  denosumab (XGEVA) [place] 120 mg   Delton See is due to today.  Calcium is WNL.  She is taking Megace and she notes that her appetite has increased.  She continues to lose weight unfortunately.  She is down another 8 lbs over the past month.  Overall, she is down 48 lbs since 05/08/2014.  She notes 2 good meals yesterday and she is obeying Burtis Junes', RD recommendation for whole milk consumption.  She has refrained for sodas x 8 weeks.  Yesterday, she had lunch consisting of a chicken soup (whole bowl) and a salad.  For dinner she had oodles of noodles with a small can of chicken noodle soup.  This was followed by a whole tomato sandwich.  She have never eaten breakfast ("I don't like breakfast").  We remain concerned about her continued weight loss, but she is tolerating treatment well and experiencing a positive response to therapy.  "I FEEL GREAT!"  She had to buy new clothes due to her weight loss.  "I feel healthy!"  Oxycodone refilled today.  Labs in 4 weeks: CBC diff, CMET, CA 27.29, CA15-3.  Return in 4 weeks for follow-up, labs, and denosumab injection.  Sliding  Scale: 151-200: 2 units 201-250: 4 units 251- 300: 6 units Greater than 300: 8 units and call MD.      ORDERS PLACED FOR THIS ENCOUNTER: No orders of the defined types were placed in this encounter.    MEDICATIONS PRESCRIBED THIS ENCOUNTER: Meds ordered this encounter  Medications  . Oxycodone HCl 10 MG TABS    Sig: Take 1 tablet every 4 hours as needed for pain.    Dispense:  100 tablet    Refill:  0    Order Specific Question:  Supervising Provider    Answer:  Patrici Ranks [id number]    THERAPY PLAN:  Continue Afinitor 5 mg, Exemestane, and Xgeva. Will be mindful of Afinitor side effects including, but not limited to, stomatitis and pneumonitis.   All questions were answered. The patient knows to call the clinic with any problems, questions or concerns. We can certainly see the patient much sooner if necessary.  Patient and plan discussed with Dr. Ancil Linsey and she is in agreement with the aforementioned.  This note is electronically signed by: Doy Mince 07/03/2015 9:40 AM

## 2015-07-03 NOTE — Progress Notes (Signed)
Kaylee Mitchell presents today for injection per MD orders. X-Geva 120mg  administered SQ in left Abdomen. Administration without incident. Patient tolerated well.

## 2015-07-03 NOTE — Patient Instructions (Signed)
[place] at [facility] Discharge Instructions  RECOMMENDATIONS MADE BY THE CONSULTANT AND ANY TEST RESULTS WILL BE SENT TO YOUR REFERRING PHYSICIAN.  X-Geva given today per orders Follow up as scheduled.  Thank you for choosing [place] at [facility] to provide your oncology and hematology care.  To afford each patient quality time with our provider, please arrive at least 15 minutes before your scheduled appointment time.   Beginning January 23rd 2017 lab work for the [facility] will be done in the  Main lab at [facility] on 1st floor. If you have a lab appointment with the [facility] please come in thru the  Main Entrance and check in at the main information desk  You need to re-schedule your appointment should you arrive 10 or more minutes late.  We strive to give you quality time with our providers, and arriving late affects you and other patients whose appointments are after yours.  Also, if you no show three or more times for appointments you may be dismissed from the clinic at the providers discretion.     Again, thank you for choosing [facility].  Our hope is that these requests will decrease the amount of time that you wait before being seen by our physicians.       _____________________________________________________________  Should you have questions after your visit to [facility], please contact our office at [phone] between the hours of 8:30 a.m. and 4:30 p.m.  Voicemails left after 4:30 p.m. will not be returned until the following business day.  For prescription refill requests, have your pharmacy contact our office.         Resources For Cancer Patients and their Caregivers ? American Cancer Society: Can assist with transportation, wigs, general needs, runs Look Good Feel Better.        812 411 4694 ? Cancer Care: Provides financial assistance, online support groups,  medication/co-pay assistance.  1-800-813-HOPE 937-464-3686) ? [place] Assists [place] cancer patients and their families through emotional , educational and financial support.  6367703031 ? [place] DSS Where to apply for food stamps, Medicaid and utility assistance. 4807694820 ? [employer]: Transportation to medical appointments. (220)269-7950 ? Social Security Administration: May apply for disability if have a Stage IV cancer. 913-128-2762 (775) 664-2022 ? [employer], Disability and Transit Services: Assists with nutrition, care and transit needs. [place] Support Programs: @10RELATIVEDAYS @ > Cancer Support Group  2nd Tuesday of the month 1pm-2pm, Journey Room  > Creative Journey  3rd Tuesday of the month 1130am-1pm, Journey Room  > Look Good Feel Better  1st Wednesday of the month 10am-12 noon, Journey Room (Call [place] to register [phone])

## 2015-07-03 NOTE — Patient Instructions (Addendum)
[place] at [facility] Discharge Instructions  RECOMMENDATIONS MADE BY THE CONSULTANT AND ANY TEST RESULTS WILL BE SENT TO YOUR REFERRING PHYSICIAN.  Exam done and seen today by Merian Capron given today Continue Calcium and vit D Labs every 4 weeks Need to take 8 units of insulin today when you get home Labs reviewed today Return to see the Doctor in 4 weeks Call the clinic for any concerns or questions.  Thank you for choosing [place] at [facility] to provide your oncology and hematology care.  To afford each patient quality time with our provider, please arrive at least 15 minutes before your scheduled appointment time.   Beginning January 23rd 2017 lab work for the [facility] will be done in the  Main lab at [facility] on 1st floor. If you have a lab appointment with the [facility] please come in thru the  Main Entrance and check in at the main information desk  You need to re-schedule your appointment should you arrive 10 or more minutes late.  We strive to give you quality time with our providers, and arriving late affects you and other patients whose appointments are after yours.  Also, if you no show three or more times for appointments you may be dismissed from the clinic at the providers discretion.     Again, thank you for choosing [facility].  Our hope is that these requests will decrease the amount of time that you wait before being seen by our physicians.       _____________________________________________________________  Should you have questions after your visit to [facility], please contact our office at [phone] between the hours of 8:30 a.m. and 4:30 p.m.  Voicemails left after 4:30 p.m. will not be returned until the following business day.  For prescription refill requests, have your pharmacy contact our office.         Resources For Cancer Patients and their  Caregivers ? American Cancer Society: Can assist with transportation, wigs, general needs, runs Look Good Feel Better.        216-802-8975 ? Cancer Care: Provides financial assistance, online support groups, medication/co-pay assistance.  1-800-813-HOPE (769)099-9916) ? [place] Assists [place] cancer patients and their families through emotional , educational and financial support.  318-313-9431 ? [place] DSS Where to apply for food stamps, Medicaid and utility assistance. (713) 143-7483 ? RCATS: Transportation to medical appointments. 442-358-8102 ? Social Security Administration: May apply for disability if have a Stage IV cancer. 956-844-0047 407 843 2103 ? [employer], Disability and Transit Services: Assists with nutrition, care and transit needs. [place] Support Programs: @10RELATIVEDAYS @ > Cancer Support Group  2nd Tuesday of the month 1pm-2pm, Journey Room  > Creative Journey  3rd Tuesday of the month 1130am-1pm, Journey Room  > Look Good Feel Better  1st Wednesday of the month 10am-12 noon, Journey Room (Call [place] to register [phone])

## 2015-07-03 NOTE — Assessment & Plan Note (Addendum)
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  Previously on Faslodex and Aromasin until imaging demonstrated a new hepatic lesion resulting in biopsy proven progression of disease.  Now on Aromasin + Afinitor (5 mg beginning on 02/02/2015, reduced from 7.5 mg).  Response to therapy noted on PET imaging on 01/30/2015.  Oncology history is up to date.  Labs today: CBC diff, CMET, CA27.29, and CA15-3.  I personally reviewed and went over laboratory results with the patient.  The results are noted within this dictation.  Calcium is WNL today.  Lower limits of normal.  She is compliant with Ca++ and Vit D she reports.  This is encouraged.  Hyperglycemia is noted today at 221.  She will take 8 units of insulin when she gets home.  Pain contract in place with [facility].  Oxycodone as refilled today.  DO NOT RESUSCITATE.  DNR form filled out and provided to the patient on 10/24/2014.  Xgeva monthly.  Last given on:  Oncology Flowsheet 06/05/2015  denosumab (XGEVA) [place] 120 mg   Delton See is due to today.  Calcium is WNL.  She is taking Megace and she notes that her appetite has increased.  She continues to lose weight unfortunately.  She is down another 8 lbs over the past month.  Overall, she is down 48 lbs since 05/08/2014.  She notes 2 good meals yesterday and she is obeying Burtis Junes', RD recommendation for whole milk consumption.  She has refrained for sodas x 8 weeks.  Yesterday, she had lunch consisting of a chicken soup (whole bowl) and a salad.  For dinner she had oodles of noodles with a small can of chicken noodle soup.  This was followed by a whole tomato sandwich.  She have never eaten breakfast ("I don't like breakfast").  We remain concerned about her continued weight loss, but she is tolerating treatment well and experiencing a positive response to therapy.  "I FEEL GREAT!"  She had to buy new clothes due to her weight loss.  "I feel healthy!"  Oxycodone refilled  today.  Labs in 4 weeks: CBC diff, CMET, CA 27.29, CA15-3.  Return in 4 weeks for follow-up, labs, and denosumab injection.  Sliding Scale: 151-200: 2 units 201-250: 4 units 251- 300: 6 units Greater than 300: 8 units and call MD.

## 2015-07-04 LAB — CANCER ANTIGEN 15-3: Cancer Antigen-Breast 15-3: 31 U/mL (ref <32)

## 2015-07-04 LAB — CANCER ANTIGEN 27.29: CA 27.29: 52.6 U/mL — AB (ref 0.0–38.6)

## 2015-07-08 MED ORDER — POTASSIUM CHLORIDE CRYS ER 20 MEQ PO TBCR: 20.0000 meq | EXTENDED_RELEASE_TABLET | Freq: Every day | ORAL | Status: DC

## 2015-07-14 ENCOUNTER — Other Ambulatory Visit (HOSPITAL_COMMUNITY): Payer: Self-pay | Admitting: Oncology

## 2015-07-15 ENCOUNTER — Other Ambulatory Visit (HOSPITAL_COMMUNITY): Payer: Self-pay | Admitting: *Deleted

## 2015-07-15 DIAGNOSIS — E876 Hypokalemia: Secondary | ICD-10-CM

## 2015-07-15 MED ORDER — POTASSIUM CHLORIDE CRYS ER 20 MEQ PO TBCR: 20.0000 meq | EXTENDED_RELEASE_TABLET | Freq: Every day | ORAL | Status: DC

## 2015-07-31 ENCOUNTER — Encounter (HOSPITAL_COMMUNITY): Payer: Self-pay | Admitting: Oncology

## 2015-07-31 ENCOUNTER — Encounter (HOSPITAL_COMMUNITY): Payer: Medicare Other

## 2015-07-31 ENCOUNTER — Encounter (HOSPITAL_BASED_OUTPATIENT_CLINIC_OR_DEPARTMENT_OTHER): Payer: Medicare Other

## 2015-07-31 ENCOUNTER — Encounter (HOSPITAL_COMMUNITY): Payer: Medicare Other | Attending: Oncology | Admitting: Oncology

## 2015-07-31 VITALS — BP 164/84 | HR 94 | Temp 98.0°F | Resp 16

## 2015-07-31 VITALS — Wt 258.0 lb

## 2015-07-31 DIAGNOSIS — T502X5A Adverse effect of carbonic-anhydrase inhibitors, benzothiadiazides and other diuretics, initial encounter: Principal | ICD-10-CM

## 2015-07-31 DIAGNOSIS — C50919 Malignant neoplasm of unspecified site of unspecified female breast: Secondary | ICD-10-CM | POA: Diagnosis not present

## 2015-07-31 DIAGNOSIS — E876 Hypokalemia: Secondary | ICD-10-CM

## 2015-07-31 DIAGNOSIS — C7951 Secondary malignant neoplasm of bone: Secondary | ICD-10-CM | POA: Diagnosis not present

## 2015-07-31 LAB — COMPREHENSIVE METABOLIC PANEL
ALK PHOS: 98 U/L (ref 38–126)
ALT: 26 U/L (ref 14–54)
AST: 31 U/L (ref 15–41)
Albumin: 3.3 g/dL — ABNORMAL LOW (ref 3.5–5.0)
Anion gap: 6 (ref 5–15)
BUN: 16 mg/dL (ref 6–20)
CALCIUM: 9 mg/dL (ref 8.9–10.3)
CHLORIDE: 106 mmol/L (ref 101–111)
CO2: 25 mmol/L (ref 22–32)
CREATININE: 1.16 mg/dL — AB (ref 0.44–1.00)
GFR calc Af Amer: 58 mL/min — ABNORMAL LOW (ref >60)
GFR calc non Af Amer: 50 mL/min — ABNORMAL LOW (ref >60)
GLUCOSE: 309 mg/dL — AB (ref 65–99)
Potassium: 4.1 mmol/L (ref 3.5–5.1)
SODIUM: 137 mmol/L (ref 135–145)
Total Bilirubin: 0.5 mg/dL (ref 0.3–1.2)
Total Protein: 7.2 g/dL (ref 6.5–8.1)

## 2015-07-31 LAB — CBC WITH DIFFERENTIAL/PLATELET
BASOS PCT: 0 %
Basophils Absolute: 0 10*3/uL (ref 0.0–0.1)
EOS ABS: 0.1 10*3/uL (ref 0.0–0.7)
EOS PCT: 2 %
HCT: 38.5 % (ref 36.0–46.0)
HEMOGLOBIN: 12.6 g/dL (ref 12.0–15.0)
LYMPHS ABS: 2.2 10*3/uL (ref 0.7–4.0)
Lymphocytes Relative: 40 %
MCH: 26.6 pg (ref 26.0–34.0)
MCHC: 32.7 g/dL (ref 30.0–36.0)
MCV: 81.2 fL (ref 78.0–100.0)
MONO ABS: 0.3 10*3/uL (ref 0.1–1.0)
MONOS PCT: 5 %
Neutro Abs: 3 10*3/uL (ref 1.7–7.7)
Neutrophils Relative %: 53 %
PLATELETS: 209 10*3/uL (ref 150–400)
RBC: 4.74 MIL/uL (ref 3.87–5.11)
RDW: 14.1 % (ref 11.5–15.5)
WBC: 5.6 10*3/uL (ref 4.0–10.5)

## 2015-07-31 MED ORDER — OXYCODONE HCL 10 MG PO TABS: ORAL_TABLET | ORAL | Status: DC

## 2015-07-31 MED ORDER — DENOSUMAB 120 MG/1.7ML ~~LOC~~ SOLN
120.0000 mg | Freq: Once | SUBCUTANEOUS | Status: AC
  Administered 2015-07-31: 120 mg via SUBCUTANEOUS
  Filled 2015-07-31: qty 1.7

## 2015-07-31 MED ORDER — OXYCODONE HCL 10 MG PO TABS
ORAL_TABLET | ORAL | Status: DC
Start: 2015-07-31 — End: 2015-08-28

## 2015-07-31 NOTE — Assessment & Plan Note (Addendum)
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  Previously on Faslodex and Aromasin until imaging demonstrated a new hepatic lesion resulting in biopsy proven progression of disease.  Now on Aromasin + Afinitor (5 mg beginning on 02/02/2015, reduced from 7.5 mg).  Response to therapy noted on PET imaging on 01/30/2015.  Oncology history is up to date.  Labs today: CBC diff, CMET, CA27.29, and CA15-3.  I personally reviewed and went over laboratory results with the patient.  The results are noted within this dictation.  CA 27.29 is up slightly.  Will need to monitor moving forward.  Hyperglycemia is noted today.  Patient refuses 10 units of insulin the clinic and reports that she will take it at home due to cost.  Pain contract in place with [facility].  Oxycodone as refilled today.  DO NOT RESUSCITATE.  DNR form filled out and provided to the patient on 10/24/2014.  Xgeva monthly.  Last given on:  Oncology Flowsheet 07/03/2015  denosumab (XGEVA) [place] 120 mg   Delton See is due to today.    She is taking Megace and she notes that her appetite has increased.  Her weight is up 2 lbs since last month.  She is eating more food by forcing herself to eat.  Labs in 4 weeks: CBC diff, CMET, CA 27.29, CA15-3.  CT CAP with contrast in 4 weeks for restaging purposes.  Return in 4 weeks for follow-up, labs, and denosumab injection.  Sliding Scale: 151-200: 2 units 201-250: 4 units 251- 300: 6 units Greater than 300: 8 units and call MD.

## 2015-07-31 NOTE — Patient Instructions (Signed)
[place] at [facility] Discharge Instructions  RECOMMENDATIONS MADE BY THE CONSULTANT AND ANY TEST RESULTS WILL BE SENT TO YOUR REFERRING PHYSICIAN.  Kaylee Mitchell given today per protocol Follow up as scheduled.  Thank you for choosing [place] at [facility] to provide your oncology and hematology care.  To afford each patient quality time with our provider, please arrive at least 15 minutes before your scheduled appointment time.   Beginning January 23rd 2017 lab work for the [facility] will be done in the  Main lab at [facility] on 1st floor. If you have a lab appointment with the [facility] please come in thru the  Main Entrance and check in at the main information desk  You need to re-schedule your appointment should you arrive 10 or more minutes late.  We strive to give you quality time with our providers, and arriving late affects you and other patients whose appointments are after yours.  Also, if you no show three or more times for appointments you may be dismissed from the clinic at the providers discretion.     Again, thank you for choosing [facility].  Our hope is that these requests will decrease the amount of time that you wait before being seen by our physicians.       _____________________________________________________________  Should you have questions after your visit to [facility], please contact our office at [phone] between the hours of 8:30 a.m. and 4:30 p.m.  Voicemails left after 4:30 p.m. will not be returned until the following business day.  For prescription refill requests, have your pharmacy contact our office.         Resources For Cancer Patients and their Caregivers ? American Cancer Society: Can assist with transportation, wigs, general needs, runs Look Good Feel Better.        ([phone] ? Cancer Care: Provides financial assistance, online support groups,  medication/co-pay assistance.  1-800-813-HOPE 864-024-5527) ? [facility] Assists [place] cancer patients and their families through emotional , educational and financial support.  [phone] [phone] ? [place] DSS Where to apply for food stamps, Medicaid and utility assistance. 4160590922 ? RCATS: Transportation to medical appointments. ([phone] ? Social Security Administration: May apply for disability if have a Stage IV cancer. 863-815-2478 484-883-4717 ? [employer], Disability and Transit Services: Assists with nutrition, care and transit needs. [place] Support Programs: @10RELATIVEDAYS @ > Cancer Support Group  2nd Tuesday of the month 1pm-2pm, Journey Room  > Creative Journey  3rd Tuesday of the month 1130am-1pm, Journey Room  > Look Good Feel Better  1st Wednesday of the month 10am-12 noon, Journey Room (Call [place] to register [phone])

## 2015-07-31 NOTE — Progress Notes (Signed)
Kaylee Bellow, MD [place] [address]  Invasive ductal carcinoma of breast, unspecified laterality [facility]) - Plan: CT Abdomen Pelvis W Contrast, CT Chest W Contrast, Oxycodone HCl 10 MG TABS, DISCONTINUED: Oxycodone HCl 10 MG TABS  Bone metastasis ([place]) - Plan: Oxycodone HCl 10 MG TABS, DISCONTINUED: Oxycodone HCl 10 MG TABS  CURRENT THERAPY: Afinitor (5 mg) + Exemestane. Xgeva monthly.  INTERVAL HISTORY: Kaylee Mitchell 60 y.o. female returns for followup of Stage IV breast cancer, ER/PR+, HER2 neg, with new hepatic metastasis (biopsy proven).    Invasive ductal carcinoma of breast ([place])   05/26/2009 Initial Diagnosis Needle core biopsy showed invasive mammary carcinoma with 1 + lymph node   06/03/2009 Surgery Right modified radical mastectomy with 8/8 + lymph nodes by Dr. Ronnell Mitchell   07/22/2009 Cancer Staging Bone scan + for multiple osseous lesions.  No disease elsewhere.   07/24/2009 - 03/23/2010 Chemotherapy Arimidex   03/23/2010 Progression    03/23/2010 - 12/05/2012 Chemotherapy Tamoxifen   11/28/2012 Progression Worsening of lumbar spine osseous disease   12/06/2012 - 09/25/2014 Chemotherapy Fulvestrant + Exemestane   01/24/2014 PET scan No evidence of hypermetabolic recurrent or metastatic disease. Widespread osseous metastasis, without significant marrow hypermetabolism.   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   10/20/2014 - 11/13/2014 Chemotherapy Afinitor + Exemestane   11/13/2014 Adverse Reaction Increasing fatigue   11/13/2014 Treatment Plan Change Hold Afinitor, will reduce dose to 7.5 mg daily.  Continue Exemestane and Xgeva.   11/24/2014 - 01/16/2015 Chemotherapy Afinitor 7.5 mg daily, Exemestane daily, and continuation of Xgeva.   01/16/2015 Adverse Reaction Cough, ?Afinitor-induced   01/16/2015 - 02/02/2015 Chemotherapy HOLD AFFINITOR secondary to cough, weight loss,  Continue EXEMESTANE and Xgeva   01/30/2015 PET scan Significant partial metabolic treatment response in liver metastases, nonenlarged R level 2 cervical LN, non specific, stable sclerotic osseous metastases throughout skeleton   02/02/2015 Adverse Reaction Weight loss, decreased appetite   02/02/2015 Treatment Plan Change Afinitor dose further reduced to 5 mg.   02/02/2015 -  Chemotherapy Afinitor 5 mg and Exemestane with continuation of Xgeva   06/03/2015 Imaging CT CAP- Hepatic metastatic disease appears to have improved slightly in the interval from 01/30/2015. Diffuse osseous metastatic disease.   She is doing well.  She denies any complaints.  She notes that her appetite is slightly improved.  She is up 2 lbs today compared to last month.  "I am forcing food because I know I need to eat."  She denies any nausea or vomiting associated with this forcing of food.  She denies any mouth sores, SOB, or cough.  Her son   Review of Systems  Constitutional: Positive for weight loss (Weight is up 2 lbs). Negative for fever and chills.  HENT: Negative.   Eyes: Negative.  Negative for blurred vision and double vision.  Respiratory: Negative.  Negative for cough, hemoptysis and shortness of breath.   Cardiovascular: Negative.  Negative for chest pain.  Gastrointestinal: Negative for nausea, vomiting, abdominal pain, diarrhea and constipation.  Genitourinary: Negative.  Negative for dysuria and frequency.  Musculoskeletal: Negative.  Negative for falls.  Skin: Negative.   Neurological: Negative.  Negative for dizziness, sensory change, speech change, focal weakness, seizures, loss of consciousness and headaches.  Endo/Heme/Allergies: Negative.   Psychiatric/Behavioral: Negative.     Past Medical History  Diagnosis Date  . Hypertension   . High  cholesterol   . Knee pain, bilateral 2013  . Cancer ([place])     rt breast is primary  . Bone metastasis ([place]) 09/13/2010  . Diabetes mellitus     type II    . Depression   . Depression 12/14/2011  . Invasive ductal carcinoma of breast ([place]) 09/13/2010  . Opioid contract exists with Kaylee Mitchell 10/24/2014    Contract found in Chart review in letter tab.  . DNR (do not resuscitate) 11/05/2014    Past Surgical History  Procedure Laterality Date  . Knee surgery      right-arthroscopy  . Mastectomy      right side  . Abdominal hysterectomy      Family History  Problem Relation Age of Onset  . Arthritis    . Cancer    . Diabetes    . Anesthesia problems Neg Hx   . Hypotension Neg Hx   . Malignant hyperthermia Neg Hx   . Pseudochol deficiency Neg Hx   . Diabetes Mother   . Diabetes Father   . Cancer Maternal Aunt     Social History   Social History  . Marital Status: Single    Spouse Name: N/A  . Number of Children: N/A  . Years of Education: 12   Occupational History  . disabled    Social History Main Topics  . Smoking status: Never Smoker   . Smokeless tobacco: Never Used  . Alcohol Use: No  . Drug Use: No  . Sexual Activity: Yes    Birth Control/ Protection: Surgical   Other Topics Concern  . None   Social History Narrative     PHYSICAL EXAMINATION  ECOG PERFORMANCE STATUS: 1 - Symptomatic but completely ambulatory  Filed Vitals:   07/31/15 1102  BP: 164/84  Pulse: 94  Temp: 98 F (36.7 C)  Resp: 16    GENERAL:alert, no distress, comfortable, cooperative, obese, smiling and unaccompanied. SKIN: skin color, texture, turgor are normal, no rashes or significant lesions HEAD: Normocephalic, No masses, lesions, tenderness or abnormalities EYES: normal, Conjunctiva are pink and non-injected EARS: External ears normal OROPHARYNX:lips, buccal mucosa, and tongue normal and mucous membranes are moist  NECK: supple, trachea midline LYMPH:  no palpable lymphadenopathy BREAST:not examined LUNGS: clear to auscultation and percussion HEART: regular rate & rhythm, no murmurs, no gallops, S1 normal  and S2 normal ABDOMEN:abdomen soft, non-tender, obese and normal bowel sounds BACK: Back symmetric, no curvature. EXTREMITIES:less then 2 second capillary refill, no joint deformities, effusion, or inflammation, no skin discoloration, no cyanosis  NEURO: alert & oriented x 3 with fluent speech, no focal motor/sensory deficits, gait normal   LABORATORY DATA: CBC    Component Value Date/Time   WBC 5.6 07/31/2015 0905   RBC 4.74 07/31/2015 0905   HGB 12.6 07/31/2015 0905   HCT 38.5 07/31/2015 0905   PLT 209 07/31/2015 0905   MCV 81.2 07/31/2015 0905   MCH 26.6 07/31/2015 0905   MCHC 32.7 07/31/2015 0905   RDW 14.1 07/31/2015 0905   LYMPHSABS 2.2 07/31/2015 0905   MONOABS 0.3 07/31/2015 0905   EOSABS 0.1 07/31/2015 0905   BASOSABS 0.0 07/31/2015 0905      Chemistry      Component Value Date/Time   NA 137 07/31/2015 0905   K 4.1 07/31/2015 0905   CL 106 07/31/2015 0905   CO2 25 07/31/2015 0905   BUN 16 07/31/2015 0905   CREATININE 1.16* 07/31/2015 0905      Component Value  Date/Time   CALCIUM 9.0 07/31/2015 0905   ALKPHOS 98 07/31/2015 0905   AST 31 07/31/2015 0905   ALT 26 07/31/2015 0905   BILITOT 0.5 07/31/2015 0905     Lab Results  Component Value Date   LABCA2 52.6* 07/03/2015      PENDING LABS:   RADIOGRAPHIC STUDIES:  No results found.   PATHOLOGY:    ASSESSMENT AND PLAN:  Invasive ductal carcinoma of breast Stage IV Breast cancer with bone involvement requiring Xgeva.  Previously on Faslodex and Aromasin until imaging demonstrated a new hepatic lesion resulting in biopsy proven progression of disease.  Now on Aromasin + Afinitor (5 mg beginning on 02/02/2015, reduced from 7.5 mg).  Response to therapy noted on PET imaging on 01/30/2015.  Oncology history is up to date.  Labs today: CBC diff, CMET, CA27.29, and CA15-3.  I personally reviewed and went over laboratory results with the patient.  The results are noted within this dictation.  CA 27.29 is  up slightly.  Will need to monitor moving forward.  Hyperglycemia is noted today.  Patient refuses 10 units of insulin the clinic and reports that she will take it at home due to cost.  Pain contract in place with [facility].  Oxycodone as refilled today.  DO NOT RESUSCITATE.  DNR form filled out and provided to the patient on 10/24/2014.  Xgeva monthly.  Last given on:  Oncology Flowsheet 07/03/2015  denosumab (XGEVA) [place] 120 mg   Delton See is due to today.    She is taking Megace and she notes that her appetite has increased.  Her weight is up 2 lbs since last month.  She is eating more food by forcing herself to eat.  Labs in 4 weeks: CBC diff, CMET, CA 27.29, CA15-3.  CT CAP with contrast in 4 weeks for restaging purposes.  Return in 4 weeks for follow-up, labs, and denosumab injection.  Sliding Scale: 151-200: 2 units 201-250: 4 units 251- 300: 6 units Greater than 300: 8 units and call MD.    ORDERS PLACED FOR THIS ENCOUNTER: Orders Placed This Encounter  Procedures  . CT Abdomen Pelvis W Contrast  . CT Chest W Contrast    MEDICATIONS PRESCRIBED THIS ENCOUNTER: Meds ordered this encounter  Medications  . DISCONTD: Oxycodone HCl 10 MG TABS    Sig: Take 1 tablet every 4 hours as needed for pain.    Dispense:  100 tablet    Refill:  0    Order Specific Question:  Supervising Provider    Answer:  Patrici Ranks [id number]  . Oxycodone HCl 10 MG TABS    Sig: Take 1 tablet every 4 hours as needed for pain.    Dispense:  100 tablet    Refill:  0    Order Specific Question:  Supervising Provider    Answer:  Patrici Ranks [id number]    THERAPY PLAN:  Continue Afinitor 5 mg, Exemestane, and Xgeva. Will be mindful of Afinitor side effects including, but not limited to, stomatitis and pneumonitis.   All questions were answered. The patient knows to call the clinic with any problems, questions or concerns. We can certainly see the patient much sooner if  necessary.  Patient and plan discussed with Dr. Ancil Linsey and she is in agreement with the aforementioned.   This note is electronically signed by: Doy Mince 07/31/2015 4:29 PM

## 2015-07-31 NOTE — Patient Instructions (Signed)
[place] at [facility] Discharge Instructions  RECOMMENDATIONS MADE BY THE CONSULTANT AND ANY TEST RESULTS WILL BE SENT TO YOUR REFERRING PHYSICIAN.  Labs today are stable.  Some are still pending. Sugar is high at 309.  Take 10 units of insulin when you get home. We will set you up for CT scans in 3 weeks. Labs in 4 weeks Xgeva injection in 4 weeks Return for follow-up in 4 weeks.  Thank you for choosing [place] at [facility] to provide your oncology and hematology care.  To afford each patient quality time with our provider, please arrive at least 15 minutes before your scheduled appointment time.   Beginning January 23rd 2017 lab work for the [facility] will be done in the  Main lab at [facility] on 1st floor. If you have a lab appointment with the [facility] please come in thru the  Main Entrance and check in at the main information desk  You need to re-schedule your appointment should you arrive 10 or more minutes late.  We strive to give you quality time with our providers, and arriving late affects you and other patients whose appointments are after yours.  Also, if you no show three or more times for appointments you may be dismissed from the clinic at the providers discretion.     Again, thank you for choosing [facility].  Our hope is that these requests will decrease the amount of time that you wait before being seen by our physicians.       _____________________________________________________________  Should you have questions after your visit to [facility], please contact our office at [phone] between the hours of 8:30 a.m. and 4:30 p.m.  Voicemails left after 4:30 p.m. will not be returned until the following business day.  For prescription refill requests, have your pharmacy contact our office.         Resources For Cancer Patients and their Caregivers ? American Cancer  Society: Can assist with transportation, wigs, general needs, runs Look Good Feel Better.        [phone] ? Cancer Care: Provides financial assistance, online support groups, medication/co-pay assistance.  1-800-813-HOPE 6518779786) ? [place] Assists [place] cancer patients and their families through emotional , educational and financial support.  ([phone] [phone] ? [place] DSS Where to apply for food stamps, Medicaid and utility assistance. 9194855791 ? RCATS: Transportation to medical appointments. 475-491-6427 ? Social Security Administration: May apply for disability if have a Stage IV cancer. 8654057799 [phone] ? [employer], Disability and Transit Services: Assists with nutrition, care and transit needs. [place] Support Programs: @10RELATIVEDAYS @ > Cancer Support Group  2nd Tuesday of the month 1pm-2pm, Journey Room  > Creative Journey  3rd Tuesday of the month 1130am-1pm, Journey Room  > Look Good Feel Better  1st Wednesday of the month 10am-12 noon, Journey Room (Call [place] to register [phone])

## 2015-07-31 NOTE — Progress Notes (Signed)
Kaylee Mitchell presents today for injection per MD orders. xgeva 120mg  administered SQ in left Abdomen. Administration without incident. Patient tolerated well.

## 2015-08-01 LAB — CANCER ANTIGEN 27.29: CA 27.29: 53.1 U/mL — ABNORMAL HIGH (ref 0.0–38.6)

## 2015-08-01 LAB — CANCER ANTIGEN 15-3: CA 15-3: 31.2 U/mL — ABNORMAL HIGH (ref 0.0–25.0)

## 2015-08-04 ENCOUNTER — Other Ambulatory Visit (HOSPITAL_COMMUNITY): Payer: Self-pay | Admitting: Oncology

## 2015-08-04 DIAGNOSIS — C50919 Malignant neoplasm of unspecified site of unspecified female breast: Secondary | ICD-10-CM

## 2015-08-05 DIAGNOSIS — G4700 Insomnia, unspecified: Secondary | ICD-10-CM | POA: Diagnosis not present

## 2015-08-05 DIAGNOSIS — C787 Secondary malignant neoplasm of liver and intrahepatic bile duct: Secondary | ICD-10-CM | POA: Diagnosis not present

## 2015-08-05 DIAGNOSIS — C50911 Malignant neoplasm of unspecified site of right female breast: Secondary | ICD-10-CM | POA: Diagnosis not present

## 2015-08-05 DIAGNOSIS — E1065 Type 1 diabetes mellitus with hyperglycemia: Secondary | ICD-10-CM | POA: Diagnosis not present

## 2015-08-20 DIAGNOSIS — E1142 Type 2 diabetes mellitus with diabetic polyneuropathy: Secondary | ICD-10-CM | POA: Diagnosis not present

## 2015-08-20 DIAGNOSIS — B351 Tinea unguium: Secondary | ICD-10-CM | POA: Diagnosis not present

## 2015-08-20 DIAGNOSIS — L84 Corns and callosities: Secondary | ICD-10-CM | POA: Diagnosis not present

## 2015-08-21 ENCOUNTER — Other Ambulatory Visit (HOSPITAL_COMMUNITY): Payer: Self-pay | Admitting: Oncology

## 2015-08-21 ENCOUNTER — Ambulatory Visit (HOSPITAL_COMMUNITY)
Admission: RE | Admit: 2015-08-21 | Discharge: 2015-08-21 | Disposition: A | Discharge status: Home / Self Care | Payer: Medicare Other | Source: Ambulatory Visit | Attending: Oncology | Admitting: Oncology

## 2015-08-21 ENCOUNTER — Other Ambulatory Visit (HOSPITAL_COMMUNITY): Payer: Self-pay | Admitting: Emergency Medicine

## 2015-08-21 DIAGNOSIS — C7951 Secondary malignant neoplasm of bone: Secondary | ICD-10-CM | POA: Insufficient documentation

## 2015-08-21 DIAGNOSIS — I2510 Atherosclerotic heart disease of native coronary artery without angina pectoris: Secondary | ICD-10-CM | POA: Insufficient documentation

## 2015-08-21 DIAGNOSIS — C50919 Malignant neoplasm of unspecified site of unspecified female breast: Secondary | ICD-10-CM | POA: Diagnosis not present

## 2015-08-21 DIAGNOSIS — K769 Liver disease, unspecified: Secondary | ICD-10-CM | POA: Diagnosis not present

## 2015-08-21 DIAGNOSIS — C7952 Secondary malignant neoplasm of bone marrow: Secondary | ICD-10-CM | POA: Diagnosis not present

## 2015-08-21 MED ORDER — IOPAMIDOL (ISOVUE-300) INJECTION 61%
100.0000 mL | Freq: Once | INTRAVENOUS | Status: AC | PRN
  Administered 2015-08-21: 100 mL via INTRAVENOUS

## 2015-08-28 ENCOUNTER — Other Ambulatory Visit (HOSPITAL_COMMUNITY): Payer: Medicare Other

## 2015-08-28 ENCOUNTER — Encounter (HOSPITAL_COMMUNITY): Payer: Self-pay | Admitting: Oncology

## 2015-08-28 ENCOUNTER — Encounter (HOSPITAL_COMMUNITY): Payer: Medicare Other

## 2015-08-28 ENCOUNTER — Encounter (HOSPITAL_COMMUNITY): Payer: Medicare Other | Attending: Oncology

## 2015-08-28 ENCOUNTER — Encounter (HOSPITAL_BASED_OUTPATIENT_CLINIC_OR_DEPARTMENT_OTHER): Payer: Medicare Other | Admitting: Oncology

## 2015-08-28 VITALS — HR 98 | Temp 99.5°F | Resp 18 | Wt 259.8 lb

## 2015-08-28 DIAGNOSIS — F40240 Claustrophobia: Secondary | ICD-10-CM

## 2015-08-28 DIAGNOSIS — C7951 Secondary malignant neoplasm of bone: Secondary | ICD-10-CM | POA: Diagnosis not present

## 2015-08-28 DIAGNOSIS — C50919 Malignant neoplasm of unspecified site of unspecified female breast: Secondary | ICD-10-CM

## 2015-08-28 DIAGNOSIS — C787 Secondary malignant neoplasm of liver and intrahepatic bile duct: Secondary | ICD-10-CM

## 2015-08-28 LAB — COMPREHENSIVE METABOLIC PANEL
ALK PHOS: 110 U/L (ref 38–126)
ALT: 40 U/L (ref 14–54)
ANION GAP: 9 (ref 5–15)
AST: 47 U/L — ABNORMAL HIGH (ref 15–41)
Albumin: 3.4 g/dL — ABNORMAL LOW (ref 3.5–5.0)
BILIRUBIN TOTAL: 0.5 mg/dL (ref 0.3–1.2)
BUN: 15 mg/dL (ref 6–20)
CALCIUM: 8.9 mg/dL (ref 8.9–10.3)
CO2: 27 mmol/L (ref 22–32)
Chloride: 102 mmol/L (ref 101–111)
Creatinine, Ser: 1.11 mg/dL — ABNORMAL HIGH (ref 0.44–1.00)
GFR calc Af Amer: >60 mL/min (ref >60)
GFR, EST NON AFRICAN AMERICAN: 52 mL/min — AB (ref >60)
Glucose, Bld: 210 mg/dL — ABNORMAL HIGH (ref 65–99)
POTASSIUM: 4.1 mmol/L (ref 3.5–5.1)
Sodium: 138 mmol/L (ref 135–145)
TOTAL PROTEIN: 7.1 g/dL (ref 6.5–8.1)

## 2015-08-28 LAB — CBC WITH DIFFERENTIAL/PLATELET
BASOS ABS: 0 10*3/uL (ref 0.0–0.1)
BASOS PCT: 1 %
EOS PCT: 3 %
Eosinophils Absolute: 0.2 10*3/uL (ref 0.0–0.7)
HCT: 39.8 % (ref 36.0–46.0)
Hemoglobin: 12.9 g/dL (ref 12.0–15.0)
LYMPHS PCT: 40 %
Lymphs Abs: 2.3 10*3/uL (ref 0.7–4.0)
MCH: 26.5 pg (ref 26.0–34.0)
MCHC: 32.4 g/dL (ref 30.0–36.0)
MCV: 81.7 fL (ref 78.0–100.0)
MONO ABS: 0.5 10*3/uL (ref 0.1–1.0)
Monocytes Relative: 8 %
NEUTROS ABS: 2.9 10*3/uL (ref 1.7–7.7)
Neutrophils Relative %: 48 %
PLATELETS: 191 10*3/uL (ref 150–400)
RBC: 4.87 MIL/uL (ref 3.87–5.11)
RDW: 14.4 % (ref 11.5–15.5)
WBC: 5.8 10*3/uL (ref 4.0–10.5)

## 2015-08-28 MED ORDER — DENOSUMAB 120 MG/1.7ML ~~LOC~~ SOLN
120.0000 mg | Freq: Once | SUBCUTANEOUS | Status: AC
  Administered 2015-08-28: 120 mg via SUBCUTANEOUS
  Filled 2015-08-28: qty 1.7

## 2015-08-28 MED ORDER — OXYCODONE HCL 10 MG PO TABS: ORAL_TABLET | ORAL | 0 refills | Status: DC

## 2015-08-28 MED ORDER — ALPRAZOLAM 0.5 MG PO TABS: ORAL_TABLET | ORAL | 0 refills | Status: DC

## 2015-08-28 NOTE — Progress Notes (Signed)
Kaylee Mitchell presents today for injection per MD orders. Xgeva administered SQ in left Abdomen. Administration without incident. Patient tolerated well.

## 2015-08-28 NOTE — Patient Instructions (Signed)
[facility] at [facility] Discharge Instructions  RECOMMENDATIONS MADE BY THE CONSULTANT AND ANY TEST RESULTS WILL BE SENT TO YOUR REFERRING PHYSICIAN.  Xgeva today.    Thank you for choosing  [facility] at [facility] to provide your oncology and hematology care.  To afford each patient quality time with our provider, please arrive at least 15 minutes before your scheduled appointment time.   Beginning January 23rd 2017 lab work for the Cancer Center will be done in the  Main lab at [facility] on 1st floor. If you have a lab appointment with the Cancer Center please come in thru the  Main Entrance and check in at the main information desk  You need to re-schedule your appointment should you arrive 10 or more minutes late.  We strive to give you quality time with our providers, and arriving late affects you and other patients whose appointments are after yours.  Also, if you no show three or more times for appointments you may be dismissed from the clinic at the providers discretion.     Again, thank you for choosing [facility].  Our hope is that these requests will decrease the amount of time that you wait before being seen by our physicians.       _____________________________________________________________  Should you have questions after your visit to  Cancer Center, please contact our office at [phone] between the hours of 8:30 a.m. and 4:30 p.m.  Voicemails left after 4:30 p.m. will not be returned until the following business day.  For prescription refill requests, have your pharmacy contact our office.         Resources For Cancer Patients and their Caregivers ? American Cancer Society: Can assist with transportation, wigs, general needs, runs Look Good Feel Better.        1-888-227-6333 ? Cancer Care: Provides financial assistance, online support groups, medication/co-pay assistance.  1-800-813-HOPE  (4673) ? [facility] Assists [place] cancer patients and their families through emotional , educational and financial support.  336-427-4357 ? [place] DSS Where to apply for food stamps, Medicaid and utility assistance. 336-342-1394 ? RCATS: Transportation to medical appointments. 336-347-2287 ? Social Security Administration: May apply for disability if have a Stage IV cancer. 336-342-7796 1-800-772-1213 ? [place] Aging, Disability and Transit Services: Assists with nutrition, care and transit needs. 336-349-2343  Cancer Center Support Programs: @10RELATIVEDAYS@ > Cancer Support Group  2nd Tuesday of the month 1pm-2pm, Journey Room  > Creative Journey  3rd Tuesday of the month 1130am-1pm, Journey Room  > Look Good Feel Better  1st Wednesday of the month 10am-12 noon, Journey Room (Call American Cancer Society to register [phone])    

## 2015-08-28 NOTE — Assessment & Plan Note (Addendum)
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  Previously on Faslodex and Aromasin until imaging demonstrated a new hepatic lesion resulting in biopsy proven progression of disease.  Now on Aromasin + Afinitor (5 mg beginning on 02/02/2015, reduced from 7.5 mg).  Response to therapy noted on PET imaging on 01/30/2015.  Oncology history is up to date.  Labs today: CBC diff, CMET, CA27.29, and CA15-3.  I personally reviewed and went over laboratory results with the patient.  The results are noted within this dictation.    Pain contract in place with [facility].  Oxycodone as refilled today.  DO NOT RESUSCITATE.  DNR form filled out and provided to the patient on 10/24/2014.  Xgeva monthly.  Last given on:  Oncology Flowsheet 07/31/2015  denosumab (XGEVA) [place] 120 mg   Delton See is due to today.    Labs in 4 weeks: CBC diff, CMET, CA 27.29, CA15-3.  I personally reviewed and went over radiographic studies with the patient.  The results are noted within this dictation.  CT CAP on 08/21/2015 demonstrates stability of disease EXCEPT for a possible new 10 mm hepatic lesion not previously seen.  As a result, an MRI of liver is ordered and scheduled for next week.  She is catastrophic and therefore, I have prescribed Xanax prior to her MRI.  She is advised to have a driver to and from her MRI.  Return in 4 weeks for follow-up, labs, and denosumab injection.  Sliding Scale: 151-200: 2 units 201-250: 4 units 251- 300: 6 units Greater than 300: 8 units and call MD.

## 2015-08-28 NOTE — Patient Instructions (Signed)
[place] at [facility] Discharge Instructions  RECOMMENDATIONS MADE BY THE CONSULTANT AND ANY TEST RESULTS WILL BE SENT TO YOUR REFERRING PHYSICIAN.  You were seen by Gershon Mussel today. Xanax prescription given. MRI scheduled Xgeva and follow up in 1 month Insulin 8 units at home  Please call the clinic with any related concerns.  Thank you for choosing [place] at [facility] to provide your oncology and hematology care.  To afford each patient quality time with our provider, please arrive at least 15 minutes before your scheduled appointment time.   Beginning January 23rd 2017 lab work for the [facility] will be done in the  Main lab at [facility] on 1st floor. If you have a lab appointment with the [facility] please come in thru the  Main Entrance and check in at the main information desk  You need to re-schedule your appointment should you arrive 10 or more minutes late.  We strive to give you quality time with our providers, and arriving late affects you and other patients whose appointments are after yours.  Also, if you no show three or more times for appointments you may be dismissed from the clinic at the providers discretion.     Again, thank you for choosing [facility].  Our hope is that these requests will decrease the amount of time that you wait before being seen by our physicians.       _____________________________________________________________  Should you have questions after your visit to [facility], please contact our office at [phone] between the hours of 8:30 a.m. and 4:30 p.m.  Voicemails left after 4:30 p.m. will not be returned until the following business day.  For prescription refill requests, have your pharmacy contact our office.         Resources For Cancer Patients and their Caregivers ? American Cancer Society: Can assist with transportation, wigs, general needs, runs  Look Good Feel Better.        971-595-9200 ? Cancer Care: Provides financial assistance, online support groups, medication/co-pay assistance.  1-800-813-HOPE (415) 038-1742) ? [place] Assists [place] cancer patients and their families through emotional , educational and financial support.  (504)300-0834 ? [place] DSS Where to apply for food stamps, Medicaid and utility assistance. (463)341-8074 ? RCATS: Transportation to medical appointments. [phone] ? Social Security Administration: May apply for disability if have a Stage IV cancer. 213-698-0446 219-705-5586 ? [employer], Disability and Transit Services: Assists with nutrition, care and transit needs. [place] Support Programs: @10RELATIVEDAYS @ > Cancer Support Group  2nd Tuesday of the month 1pm-2pm, Journey Room  > Creative Journey  3rd Tuesday of the month 1130am-1pm, Journey Room  > Look Good Feel Better  1st Wednesday of the month 10am-12 noon, Journey Room (Call [place] to register [phone])

## 2015-08-28 NOTE — Progress Notes (Signed)
[name], MD [place] [address]  Invasive ductal carcinoma of breast, unspecified laterality ([place]) - Plan: Oxycodone HCl 10 MG TABS  [place] - Plan: ALPRAZolam (XANAX) 0.5 MG tablet  Bone metastasis ([place]) - Plan: Oxycodone HCl 10 MG TABS  CURRENT THERAPY: Afinitor (5 mg) + Exemestane. Xgeva monthly.  INTERVAL HISTORY: Kaylee Mitchell 60 y.o. female returns for followup of Stage IV breast cancer, ER/PR+, HER2 neg, with new hepatic metastasis (biopsy proven).    Invasive ductal carcinoma of breast ([place])   05/26/2009 Initial Diagnosis    Needle core biopsy showed invasive mammary carcinoma with 1 + lymph node     06/03/2009 Surgery    Right modified radical mastectomy with 8/8 + lymph nodes by Dr. Ronnell Freshwater     07/22/2009 Cancer Staging    Bone scan + for multiple osseous lesions.  No disease elsewhere.     07/24/2009 - 03/23/2010 Chemotherapy    Arimidex     03/23/2010 Progression         03/23/2010 - 12/05/2012 Chemotherapy    Tamoxifen     11/28/2012 Progression    Worsening of lumbar spine osseous disease     12/06/2012 - 09/25/2014 Chemotherapy    Fulvestrant + Exemestane     01/24/2014 PET scan    No evidence of hypermetabolic recurrent or metastatic disease. Widespread osseous metastasis, without significant marrow hypermetabolism.     09/18/2014 Progression    PET- New hepatic metastasis within the LEFT hepatic lobe involving segments IVA, IVB, and III.     10/20/2014 - 11/13/2014 Chemotherapy    Afinitor + Exemestane     11/13/2014 Adverse Reaction    Increasing fatigue     11/13/2014 Treatment Plan Change    Hold Afinitor, will reduce dose to 7.5 mg daily.  Continue Exemestane and Xgeva.     11/24/2014 - 01/16/2015 Chemotherapy    Afinitor 7.5 mg daily, Exemestane daily, and continuation of Xgeva.     01/16/2015 Adverse Reaction    Cough, ?Afinitor-induced     01/16/2015 - 02/02/2015 Chemotherapy    HOLD  AFFINITOR secondary to cough, weight loss, Continue EXEMESTANE and Xgeva     01/30/2015 PET scan    Significant partial metabolic treatment response in liver metastases, nonenlarged R level 2 cervical LN, non specific, stable sclerotic osseous metastases throughout skeleton     02/02/2015 Adverse Reaction    Weight loss, decreased appetite     02/02/2015 Treatment Plan Change    Afinitor dose further reduced to 5 mg.     02/02/2015 -  Chemotherapy    Afinitor 5 mg and Exemestane with continuation of Xgeva     06/03/2015 Imaging    CT CAP- Hepatic metastatic disease appears to have improved slightly in the interval from 01/30/2015. Diffuse osseous metastatic disease.     08/21/2015 Imaging    CT CAP- Stable CT of the chest. No evidence for metastatic disease in the thorax. Overall, no substantial change in pre-existing liver lesions. There may be a new 10 mm lesion in the posterior right dome of liver, not seen previously.       She continues to tolerate treatment well.  She has gained another 1-2 lbs.  She is eating well.  Her sugars are better controlled with the help of Dr. Karie Kirks which may be the cause of her past weight loss.    She denies any complaints.  Review of Systems  Constitutional: Positive for weight loss (Weight is up 2 lbs). Negative for chills and fever.  HENT: Negative.   Eyes: Negative.  Negative for blurred vision and double vision.  Respiratory: Negative.  Negative for cough, hemoptysis and shortness of breath.   Cardiovascular: Negative.  Negative for chest pain.  Gastrointestinal: Negative for abdominal pain, constipation, diarrhea, nausea and vomiting.  Genitourinary: Negative.  Negative for dysuria and frequency.  Musculoskeletal: Negative.  Negative for falls.  Skin: Negative.   Neurological: Negative.  Negative for dizziness, sensory change, speech change, focal weakness, seizures, loss of consciousness and headaches.  Endo/Heme/Allergies: Negative.     Psychiatric/Behavioral: Negative.     Past Medical History:  Diagnosis Date  . Bone metastasis ([place]) 09/13/2010  . Cancer ([place])    rt breast is primary  . Depression   . Depression 12/14/2011  . Diabetes mellitus    type II  . DNR (do not resuscitate) 11/05/2014  . High cholesterol   . Hypertension   . Invasive ductal carcinoma of breast ([place]) 09/13/2010  . Knee pain, bilateral 2013  . Opioid contract exists with [facility] 10/24/2014   Contract found in Chart review in letter tab.    Past Surgical History:  Procedure Laterality Date  . ABDOMINAL HYSTERECTOMY    . KNEE SURGERY     right-arthroscopy  . MASTECTOMY     right side    Family History  Problem Relation Age of Onset  . Diabetes Mother   . Diabetes Father   . Arthritis    . Cancer    . Diabetes    . Cancer Maternal Aunt   . Anesthesia problems Neg Hx   . Hypotension Neg Hx   . Malignant hyperthermia Neg Hx   . Pseudochol deficiency Neg Hx     Social History   Social History  . Marital status: Single    Spouse name: N/A  . Number of children: N/A  . Years of education: 20   Occupational History  . disabled Unemployed   Social History Main Topics  . Smoking status: Never Smoker  . Smokeless tobacco: Never Used  . Alcohol use No  . Drug use: No  . Sexual activity: Yes    Birth control/ protection: Surgical   Other Topics Concern  . None   Social History Narrative  . None     PHYSICAL EXAMINATION  ECOG PERFORMANCE STATUS: 1 - Symptomatic but completely ambulatory  Vitals:   08/28/15 0900  Pulse: 98  Resp: 18  Temp: 99.5 F (37.5 C)    GENERAL:alert, no distress, comfortable, cooperative, obese, smiling and unaccompanied. SKIN: skin color, texture, turgor are normal, no rashes or significant lesions HEAD: Normocephalic, No masses, lesions, tenderness or abnormalities EYES: normal, Conjunctiva are pink and non-injected EARS: External ears normal OROPHARYNX:lips,  buccal mucosa, and tongue normal and mucous membranes are moist  NECK: supple, trachea midline LYMPH:  no palpable lymphadenopathy BREAST:not examined LUNGS: clear to auscultation and percussion HEART: regular rate & rhythm, no murmurs, no gallops, S1 normal and S2 normal ABDOMEN:abdomen soft, non-tender, obese and normal bowel sounds BACK: Back symmetric, no curvature. EXTREMITIES:less then 2 second capillary refill, no joint deformities, effusion, or inflammation, no skin discoloration, no cyanosis  NEURO: alert & oriented x 3 with fluent speech, no focal motor/sensory deficits, gait normal   LABORATORY DATA: CBC    Component Value Date/Time   WBC 5.8 08/28/2015 0827   RBC 4.87 08/28/2015 0827   HGB 12.9 08/28/2015  0827   HCT 39.8 08/28/2015 0827   PLT 191 08/28/2015 0827   MCV 81.7 08/28/2015 0827   MCH 26.5 08/28/2015 0827   MCHC 32.4 08/28/2015 0827   RDW 14.4 08/28/2015 0827   LYMPHSABS 2.3 08/28/2015 0827   MONOABS 0.5 08/28/2015 0827   EOSABS 0.2 08/28/2015 0827   BASOSABS 0.0 08/28/2015 0827      Chemistry      Component Value Date/Time   NA 138 08/28/2015 0827   K 4.1 08/28/2015 0827   CL 102 08/28/2015 0827   CO2 27 08/28/2015 0827   BUN 15 08/28/2015 0827   CREATININE 1.11 (H) 08/28/2015 0827      Component Value Date/Time   CALCIUM 8.9 08/28/2015 0827   ALKPHOS 110 08/28/2015 0827   AST 47 (H) 08/28/2015 0827   ALT 40 08/28/2015 0827   BILITOT 0.5 08/28/2015 0827     Lab Results  Component Value Date   LABCA2 53.1 (H) 07/31/2015      PENDING LABS:   RADIOGRAPHIC STUDIES:  Ct Chest W Contrast  Result Date: 08/21/2015 CLINICAL DATA:  Stage IV breast cancer metastatic to bone marrow an liver. EXAM: CT CHEST, ABDOMEN, AND PELVIS WITH CONTRAST TECHNIQUE: Multidetector CT imaging of the chest, abdomen and pelvis was performed following the standard protocol during bolus administration of intravenous contrast. CONTRAST:  116m ISOVUE-300 IOPAMIDOL  (ISOVUE-300) INJECTION 61% COMPARISON:  06/03/2015. FINDINGS: CT CHEST FINDINGS Cardiovascular: The heart size is normal. No pericardial effusion. Coronary artery calcification is noted. No pericardial effusion. Atherosclerotic calcification is noted in the wall of the throracic aorta. Mediastinum/Nodes: No mediastinal lymphadenopathy. Stable prominence left inferior thyroid lobe There is no hilar lymphadenopathy. The esophagus has normal imaging features. Surgical clips noted right axilla. There is no axillary lymphadenopathy. Lungs/Pleura: Fine parenchymal detail on the lungs obscured by breathing motion. No focal airspace consolidation. No pulmonary edema or pleural effusion. No suspicious pulmonary nodule or mass. Musculoskeletal: Sclerotic bony metastases in the thoracic spine, ribs, and sternum appear stable. CT ABDOMEN PELVIS FINDINGS Hepatobiliary: 2.1 cm lesion measured in the anterior right liver on the previous study is now 1.8 cm. 8 mm hypodensity towards the dome of the liver is stable. 10 mm rim enhancing lesion posterior right hepatic dome (image 39 series 2) not definitely seen on prior imaging. There is no evidence for gallstones, gallbladder wall thickening, or pericholecystic fluid. No intrahepatic or extrahepatic biliary dilation. Pancreas: No focal mass lesion. No dilatation of the main duct. No intraparenchymal cyst. No peripancreatic edema. Spleen: No splenomegaly. No focal mass lesion. Adrenals/Urinary Tract: No adrenal nodule or mass. Tiny exophytic 4 mm interpolar right renal lesion is unchanged. Kidneys otherwise unremarkable. No evidence for hydroureter. The urinary bladder appears normal for the degree of distention. Stomach/Bowel: Small hiatal hernia. Stomach otherwise unremarkable. Duodenum is normally positioned as is the ligament of Treitz. No small bowel wall thickening. No small bowel dilatation. The terminal ileum is normal. The appendix is normal. No gross colonic mass. No  colonic wall thickening. No substantial diverticular change. Vascular/Lymphatic: There is abdominal aortic atherosclerosis without aneurysm. There is no gastrohepatic or hepatoduodenal ligament lymphadenopathy. No intraperitoneal or retroperitoneal lymphadenopathy. No pelvic sidewall lymphadenopathy. Reproductive: Uterus surgically absent.  There is no adnexal mass. Other: No intraperitoneal free fluid. Musculoskeletal: Stable appearance of widespread sclerotic bony metastases. IMPRESSION: 1. Stable CT awaken of the chest. No evidence for metastatic disease in the thorax. 2. Overall, no substantial change in pre-existing liver lesions. There may be a new 10  mm lesion in the posterior right dome of liver, not seen previously. Liver MRI without with contrast may prove helpful to further evaluate, as clinically warranted. 3. Stable appearance of diffuse sclerotic bony metastases. 4. Coronary artery atherosclerosis. Electronically Signed   By: Misty Stanley M.D.   On: 08/21/2015 11:44   Ct Abdomen Pelvis W Contrast  Result Date: 08/21/2015 CLINICAL DATA:  Stage IV breast cancer metastatic to bone marrow an liver. EXAM: CT CHEST, ABDOMEN, AND PELVIS WITH CONTRAST TECHNIQUE: Multidetector CT imaging of the chest, abdomen and pelvis was performed following the standard protocol during bolus administration of intravenous contrast. CONTRAST:  175m ISOVUE-300 IOPAMIDOL (ISOVUE-300) INJECTION 61% COMPARISON:  06/03/2015. FINDINGS: CT CHEST FINDINGS Cardiovascular: The heart size is normal. No pericardial effusion. Coronary artery calcification is noted. No pericardial effusion. Atherosclerotic calcification is noted in the wall of the throracic aorta. Mediastinum/Nodes: No mediastinal lymphadenopathy. Stable prominence left inferior thyroid lobe There is no hilar lymphadenopathy. The esophagus has normal imaging features. Surgical clips noted right axilla. There is no axillary lymphadenopathy. Lungs/Pleura: Fine  parenchymal detail on the lungs obscured by breathing motion. No focal airspace consolidation. No pulmonary edema or pleural effusion. No suspicious pulmonary nodule or mass. Musculoskeletal: Sclerotic bony metastases in the thoracic spine, ribs, and sternum appear stable. CT ABDOMEN PELVIS FINDINGS Hepatobiliary: 2.1 cm lesion measured in the anterior right liver on the previous study is now 1.8 cm. 8 mm hypodensity towards the dome of the liver is stable. 10 mm rim enhancing lesion posterior right hepatic dome (image 39 series 2) not definitely seen on prior imaging. There is no evidence for gallstones, gallbladder wall thickening, or pericholecystic fluid. No intrahepatic or extrahepatic biliary dilation. Pancreas: No focal mass lesion. No dilatation of the main duct. No intraparenchymal cyst. No peripancreatic edema. Spleen: No splenomegaly. No focal mass lesion. Adrenals/Urinary Tract: No adrenal nodule or mass. Tiny exophytic 4 mm interpolar right renal lesion is unchanged. Kidneys otherwise unremarkable. No evidence for hydroureter. The urinary bladder appears normal for the degree of distention. Stomach/Bowel: Small hiatal hernia. Stomach otherwise unremarkable. Duodenum is normally positioned as is the ligament of Treitz. No small bowel wall thickening. No small bowel dilatation. The terminal ileum is normal. The appendix is normal. No gross colonic mass. No colonic wall thickening. No substantial diverticular change. Vascular/Lymphatic: There is abdominal aortic atherosclerosis without aneurysm. There is no gastrohepatic or hepatoduodenal ligament lymphadenopathy. No intraperitoneal or retroperitoneal lymphadenopathy. No pelvic sidewall lymphadenopathy. Reproductive: Uterus surgically absent.  There is no adnexal mass. Other: No intraperitoneal free fluid. Musculoskeletal: Stable appearance of widespread sclerotic bony metastases. IMPRESSION: 1. Stable CT awaken of the chest. No evidence for metastatic  disease in the thorax. 2. Overall, no substantial change in pre-existing liver lesions. There may be a new 10 mm lesion in the posterior right dome of liver, not seen previously. Liver MRI without with contrast may prove helpful to further evaluate, as clinically warranted. 3. Stable appearance of diffuse sclerotic bony metastases. 4. Coronary artery atherosclerosis. Electronically Signed   By: EMisty StanleyM.D.   On: 08/21/2015 11:44     PATHOLOGY:    ASSESSMENT AND PLAN:  Invasive ductal carcinoma of breast Stage IV Breast cancer with bone involvement requiring Xgeva.  Previously on Faslodex and Aromasin until imaging demonstrated a new hepatic lesion resulting in biopsy proven progression of disease.  Now on Aromasin + Afinitor (5 mg beginning on 02/02/2015, reduced from 7.5 mg).  Response to therapy noted on PET imaging on 01/30/2015.  Oncology history is up to date.  Labs today: CBC diff, CMET, CA27.29, and CA15-3.  I personally reviewed and went over laboratory results with the patient.  The results are noted within this dictation.    Pain contract in place with [facility].  Oxycodone as refilled today.  DO NOT RESUSCITATE.  DNR form filled out and provided to the patient on 10/24/2014.  Xgeva monthly.  Last given on:  Oncology Flowsheet 07/31/2015  denosumab (XGEVA) [place] 120 mg   Delton See is due to today.    Labs in 4 weeks: CBC diff, CMET, CA 27.29, CA15-3.  I personally reviewed and went over radiographic studies with the patient.  The results are noted within this dictation.  CT CAP on 08/21/2015 demonstrates stability of disease EXCEPT for a possible new 10 mm hepatic lesion not previously seen.  As a result, an MRI of liver is ordered and scheduled for next week.  She is catastrophic and therefore, I have prescribed Xanax prior to her MRI.  She is advised to have a driver to and from her MRI.  Return in 4 weeks for follow-up, labs, and denosumab injection.  Sliding  Scale: 151-200: 2 units 201-250: 4 units 251- 300: 6 units Greater than 300: 8 units and call MD.   ORDERS PLACED FOR THIS ENCOUNTER: No orders of the defined types were placed in this encounter.   MEDICATIONS PRESCRIBED THIS ENCOUNTER: Meds ordered this encounter  Medications  . NOVOLOG FLEXPEN 100 UNIT/ML FlexPen  . metFORMIN (GLUCOPHAGE-XR) 500 MG 24 hr tablet  . temazepam (RESTORIL) 15 MG capsule  . ALPRAZolam (XANAX) 0.5 MG tablet    Sig: Take 1 tablet 2 hours before MRI, if needed, you can repeat another dose in the waiting room.    Dispense:  3 tablet    Refill:  0    Order Specific Question:   Supervising Provider    Answer:   Patrici Ranks [id number]  . Oxycodone HCl 10 MG TABS    Sig: Take 1 tablet every 4 hours as needed for pain.    Dispense:  100 tablet    Refill:  0    Order Specific Question:   Supervising Provider    Answer:   Patrici Ranks [id number]    THERAPY PLAN:  Continue Afinitor 5 mg, Exemestane, and Xgeva. Will be mindful of Afinitor side effects including, but not limited to, stomatitis and pneumonitis.   All questions were answered. The patient knows to call the clinic with any problems, questions or concerns. We can certainly see the patient much sooner if necessary.  Patient and plan discussed with Dr. Ancil Linsey and she is in agreement with the aforementioned.   This note is electronically signed by: Doy Mince 08/28/2015 9:56 PM

## 2015-08-29 LAB — CANCER ANTIGEN 15-3: CAN 15 3: 37.9 U/mL — AB (ref 0.0–25.0)

## 2015-08-29 LAB — CANCER ANTIGEN 27.29: CA 27.29: 59.4 U/mL — AB (ref 0.0–38.6)

## 2015-09-01 ENCOUNTER — Ambulatory Visit (HOSPITAL_COMMUNITY): Payer: Medicare Other

## 2015-09-03 ENCOUNTER — Encounter (HOSPITAL_COMMUNITY): Payer: Self-pay | Admitting: Oncology

## 2015-09-03 ENCOUNTER — Other Ambulatory Visit (HOSPITAL_COMMUNITY): Payer: Self-pay | Admitting: Oncology

## 2015-09-03 DIAGNOSIS — C50919 Malignant neoplasm of unspecified site of unspecified female breast: Secondary | ICD-10-CM

## 2015-09-03 MED ORDER — EVEROLIMUS 5 MG PO TABS: 5.0000 mg | ORAL_TABLET | Freq: Every day | ORAL | 3 refills | Status: DC

## 2015-09-14 ENCOUNTER — Encounter (HOSPITAL_COMMUNITY): Payer: Self-pay

## 2015-09-14 ENCOUNTER — Ambulatory Visit (HOSPITAL_COMMUNITY)
Admission: RE | Admit: 2015-09-14 | Discharge: 2015-09-14 | Disposition: A | Discharge status: Home / Self Care | Payer: Medicare Other | Source: Ambulatory Visit | Attending: Oncology | Admitting: Oncology

## 2015-09-14 DIAGNOSIS — C50919 Malignant neoplasm of unspecified site of unspecified female breast: Secondary | ICD-10-CM

## 2015-09-15 ENCOUNTER — Other Ambulatory Visit (HOSPITAL_COMMUNITY): Payer: Self-pay | Admitting: Oncology

## 2015-09-15 DIAGNOSIS — F40240 Claustrophobia: Secondary | ICD-10-CM

## 2015-09-15 MED ORDER — ALPRAZOLAM 0.5 MG PO TABS: ORAL_TABLET | ORAL | 0 refills | Status: DC

## 2015-09-16 ENCOUNTER — Other Ambulatory Visit (HOSPITAL_COMMUNITY): Payer: Self-pay | Admitting: Oncology

## 2015-09-16 DIAGNOSIS — C50919 Malignant neoplasm of unspecified site of unspecified female breast: Secondary | ICD-10-CM

## 2015-09-22 ENCOUNTER — Other Ambulatory Visit: Payer: Medicare Other

## 2015-09-23 ENCOUNTER — Ambulatory Visit (HOSPITAL_COMMUNITY)
Admission: RE | Admit: 2015-09-23 | Discharge: 2015-09-23 | Disposition: A | Discharge status: Home / Self Care | Payer: Medicare Other | Source: Ambulatory Visit | Attending: Oncology | Admitting: Oncology

## 2015-09-23 ENCOUNTER — Other Ambulatory Visit (HOSPITAL_COMMUNITY): Payer: Self-pay | Admitting: Oncology

## 2015-09-23 ENCOUNTER — Other Ambulatory Visit (HOSPITAL_COMMUNITY): Payer: Self-pay

## 2015-09-23 ENCOUNTER — Telehealth (HOSPITAL_COMMUNITY): Payer: Self-pay | Admitting: *Deleted

## 2015-09-23 ENCOUNTER — Telehealth (HOSPITAL_COMMUNITY): Payer: Self-pay

## 2015-09-23 DIAGNOSIS — C50919 Malignant neoplasm of unspecified site of unspecified female breast: Secondary | ICD-10-CM | POA: Diagnosis not present

## 2015-09-23 DIAGNOSIS — C7951 Secondary malignant neoplasm of bone: Secondary | ICD-10-CM

## 2015-09-23 DIAGNOSIS — M79604 Pain in right leg: Secondary | ICD-10-CM

## 2015-09-23 DIAGNOSIS — M25551 Pain in right hip: Secondary | ICD-10-CM | POA: Insufficient documentation

## 2015-09-23 DIAGNOSIS — I739 Peripheral vascular disease, unspecified: Secondary | ICD-10-CM | POA: Insufficient documentation

## 2015-09-23 DIAGNOSIS — M79651 Pain in right thigh: Secondary | ICD-10-CM | POA: Diagnosis not present

## 2015-09-23 NOTE — Telephone Encounter (Signed)
Pt aware there are no new findings on x ray.

## 2015-09-23 NOTE — Telephone Encounter (Signed)
-----   Message from [facility], PA-C sent at 09/23/2015  3:27 PM EDT ----- No new findings.

## 2015-09-23 NOTE — Telephone Encounter (Signed)
Patient called stating she was having constant severe pain in her right side, hip and leg. Rates as 10/10.has had nausea but no vomiting, she has been using her prescription nausea and pain medications with little relief noted. She has not been able to rest in 3 nights. She can still move her leg and denies any stool or urinary incontinence. After reviewing, with PA, Right hip and femur x-rays ordered. Patient notified and states she will come have x-rays today.

## 2015-09-25 ENCOUNTER — Encounter (HOSPITAL_COMMUNITY): Payer: Medicare Other

## 2015-09-25 ENCOUNTER — Encounter (HOSPITAL_COMMUNITY): Payer: Medicare Other | Attending: Hematology & Oncology

## 2015-09-25 ENCOUNTER — Ambulatory Visit (HOSPITAL_COMMUNITY): Payer: Medicare Other | Admitting: Hematology & Oncology

## 2015-09-25 ENCOUNTER — Encounter (HOSPITAL_COMMUNITY): Payer: Self-pay

## 2015-09-25 VITALS — BP 151/80 | HR 87 | Temp 98.3°F | Resp 18

## 2015-09-25 DIAGNOSIS — C7951 Secondary malignant neoplasm of bone: Secondary | ICD-10-CM | POA: Diagnosis not present

## 2015-09-25 DIAGNOSIS — C50919 Malignant neoplasm of unspecified site of unspecified female breast: Secondary | ICD-10-CM | POA: Insufficient documentation

## 2015-09-25 LAB — CBC WITH DIFFERENTIAL/PLATELET
BASOS PCT: 0 %
Basophils Absolute: 0 10*3/uL (ref 0.0–0.1)
EOS PCT: 2 %
Eosinophils Absolute: 0.1 10*3/uL (ref 0.0–0.7)
HEMATOCRIT: 39.9 % (ref 36.0–46.0)
Hemoglobin: 12.9 g/dL (ref 12.0–15.0)
LYMPHS PCT: 36 %
Lymphs Abs: 2.5 10*3/uL (ref 0.7–4.0)
MCH: 27 pg (ref 26.0–34.0)
MCHC: 32.3 g/dL (ref 30.0–36.0)
MCV: 83.5 fL (ref 78.0–100.0)
MONO ABS: 0.5 10*3/uL (ref 0.1–1.0)
MONOS PCT: 8 %
NEUTROS ABS: 3.9 10*3/uL (ref 1.7–7.7)
Neutrophils Relative %: 54 %
Platelets: 178 10*3/uL (ref 150–400)
RBC: 4.78 MIL/uL (ref 3.87–5.11)
RDW: 13.9 % (ref 11.5–15.5)
WBC: 7 10*3/uL (ref 4.0–10.5)

## 2015-09-25 LAB — COMPREHENSIVE METABOLIC PANEL
ALT: 77 U/L — ABNORMAL HIGH (ref 14–54)
ANION GAP: 10 (ref 5–15)
AST: 72 U/L — ABNORMAL HIGH (ref 15–41)
Albumin: 3.2 g/dL — ABNORMAL LOW (ref 3.5–5.0)
Alkaline Phosphatase: 220 U/L — ABNORMAL HIGH (ref 38–126)
BILIRUBIN TOTAL: 0.6 mg/dL (ref 0.3–1.2)
BUN: 27 mg/dL — ABNORMAL HIGH (ref 6–20)
CO2: 27 mmol/L (ref 22–32)
Calcium: 9.6 mg/dL (ref 8.9–10.3)
Chloride: 99 mmol/L — ABNORMAL LOW (ref 101–111)
Creatinine, Ser: 1.26 mg/dL — ABNORMAL HIGH (ref 0.44–1.00)
GFR calc Af Amer: 52 mL/min — ABNORMAL LOW (ref >60)
GFR, EST NON AFRICAN AMERICAN: 45 mL/min — AB (ref >60)
Glucose, Bld: 253 mg/dL — ABNORMAL HIGH (ref 65–99)
POTASSIUM: 4.1 mmol/L (ref 3.5–5.1)
Sodium: 136 mmol/L (ref 135–145)
TOTAL PROTEIN: 7.3 g/dL (ref 6.5–8.1)

## 2015-09-25 MED ORDER — OXYCODONE HCL 10 MG PO TABS: ORAL_TABLET | ORAL | 0 refills | Status: DC

## 2015-09-25 MED ORDER — DENOSUMAB 120 MG/1.7ML ~~LOC~~ SOLN
120.0000 mg | Freq: Once | SUBCUTANEOUS | Status: AC
  Administered 2015-09-25: 120 mg via SUBCUTANEOUS
  Filled 2015-09-25: qty 1.7

## 2015-09-25 NOTE — Patient Instructions (Signed)
[place] at [facility] Discharge Instructions  RECOMMENDATIONS MADE BY THE CONSULTANT AND ANY TEST RESULTS WILL BE SENT TO YOUR REFERRING PHYSICIAN.  Lab work today. Xgeva injection today. Return as scheduled for lab work and injections. Return as scheduled for office visit.  Thank you for choosing [place] at [facility] to provide your oncology and hematology care.  To afford each patient quality time with our provider, please arrive at least 15 minutes before your scheduled appointment time.   Beginning January 23rd 2017 lab work for the [facility] will be done in the  Main lab at [facility] on 1st floor. If you have a lab appointment with the [facility] please come in thru the  Main Entrance and check in at the main information desk  You need to re-schedule your appointment should you arrive 10 or more minutes late.  We strive to give you quality time with our providers, and arriving late affects you and other patients whose appointments are after yours.  Also, if you no show three or more times for appointments you may be dismissed from the clinic at the providers discretion.     Again, thank you for choosing [facility].  Our hope is that these requests will decrease the amount of time that you wait before being seen by our physicians.       _____________________________________________________________  Should you have questions after your visit to [facility], please contact our office at [phone] between the hours of 8:30 a.m. and 4:30 p.m.  Voicemails left after 4:30 p.m. will not be returned until the following business day.  For prescription refill requests, have your pharmacy contact our office.         Resources For Cancer Patients and their Caregivers ? American Cancer Society: Can assist with transportation, wigs, general needs, runs Look Good Feel Better.         ([phone] ? Cancer Care: Provides financial assistance, online support groups, medication/co-pay assistance.  1-800-813-HOPE 605-803-6594) ? [place] Assists [place] cancer patients and their families through emotional , educational and financial support.  [phone] ? [place] DSS Where to apply for food stamps, Medicaid and utility assistance. 416-212-4978 ? RCATS: Transportation to medical appointments. ([phone] ? Social Security Administration: May apply for disability if have a Stage IV cancer. [phone] [phone] ? [employer], Disability and Transit Services: Assists with nutrition, care and transit needs. [place] Support Programs: @10RELATIVEDAYS @ > Cancer Support Group  2nd Tuesday of the month 1pm-2pm, Journey Room  > Creative Journey  3rd Tuesday of the month 1130am-1pm, Journey Room  > Look Good Feel Better  1st Wednesday of the month 10am-12 noon, Journey Room (Call [place] to register [phone])

## 2015-09-25 NOTE — Addendum Note (Signed)
Addended by: Joie Bimler on: 09/25/2015 10:53 AM   Modules accepted: Orders

## 2015-09-25 NOTE — Progress Notes (Signed)
Kaylee Mitchell presents today for injection per the provider's orders.  Xgeva administration without incident; see MAR for injection details.  Patient tolerated procedure well and without incident.  No questions or complaints noted at this time.

## 2015-09-26 LAB — CANCER ANTIGEN 27.29: CA 27.29: 84.5 U/mL — AB (ref 0.0–38.6)

## 2015-09-26 LAB — CANCER ANTIGEN 15-3: CAN 15 3: 51.7 U/mL — AB (ref 0.0–25.0)

## 2015-09-28 ENCOUNTER — Other Ambulatory Visit (HOSPITAL_COMMUNITY): Payer: Self-pay | Admitting: Oncology

## 2015-09-28 DIAGNOSIS — N390 Urinary tract infection, site not specified: Secondary | ICD-10-CM | POA: Diagnosis not present

## 2015-09-28 DIAGNOSIS — N10 Acute tubulo-interstitial nephritis: Secondary | ICD-10-CM | POA: Diagnosis not present

## 2015-09-28 DIAGNOSIS — J301 Allergic rhinitis due to pollen: Secondary | ICD-10-CM | POA: Diagnosis not present

## 2015-09-28 DIAGNOSIS — E1042 Type 1 diabetes mellitus with diabetic polyneuropathy: Secondary | ICD-10-CM | POA: Diagnosis not present

## 2015-09-28 DIAGNOSIS — E785 Hyperlipidemia, unspecified: Secondary | ICD-10-CM | POA: Diagnosis not present

## 2015-09-28 DIAGNOSIS — I10 Essential (primary) hypertension: Secondary | ICD-10-CM | POA: Diagnosis not present

## 2015-09-28 DIAGNOSIS — E1065 Type 1 diabetes mellitus with hyperglycemia: Secondary | ICD-10-CM | POA: Diagnosis not present

## 2015-09-30 ENCOUNTER — Ambulatory Visit
Admission: RE | Admit: 2015-09-30 | Discharge: 2015-09-30 | Disposition: A | Discharge status: Home / Self Care | Payer: Medicare Other | Source: Ambulatory Visit | Attending: Oncology | Admitting: Oncology

## 2015-09-30 DIAGNOSIS — C787 Secondary malignant neoplasm of liver and intrahepatic bile duct: Secondary | ICD-10-CM | POA: Diagnosis not present

## 2015-09-30 DIAGNOSIS — C50919 Malignant neoplasm of unspecified site of unspecified female breast: Secondary | ICD-10-CM

## 2015-09-30 MED ORDER — GADOBENATE DIMEGLUMINE 529 MG/ML IV SOLN
20.0000 mL | Freq: Once | INTRAVENOUS | Status: AC | PRN
  Administered 2015-09-30: 20 mL via INTRAVENOUS

## 2015-10-01 ENCOUNTER — Encounter (HOSPITAL_BASED_OUTPATIENT_CLINIC_OR_DEPARTMENT_OTHER): Payer: Medicare Other | Admitting: Oncology

## 2015-10-01 ENCOUNTER — Other Ambulatory Visit (HOSPITAL_COMMUNITY): Payer: Self-pay | Admitting: Pharmacist

## 2015-10-01 ENCOUNTER — Other Ambulatory Visit (HOSPITAL_COMMUNITY): Payer: Self-pay | Admitting: Hematology & Oncology

## 2015-10-01 ENCOUNTER — Encounter (HOSPITAL_COMMUNITY): Payer: Self-pay | Admitting: Oncology

## 2015-10-01 ENCOUNTER — Telehealth (HOSPITAL_COMMUNITY): Payer: Self-pay | Admitting: Emergency Medicine

## 2015-10-01 DIAGNOSIS — C50919 Malignant neoplasm of unspecified site of unspecified female breast: Secondary | ICD-10-CM | POA: Diagnosis not present

## 2015-10-01 DIAGNOSIS — C787 Secondary malignant neoplasm of liver and intrahepatic bile duct: Secondary | ICD-10-CM | POA: Diagnosis not present

## 2015-10-01 DIAGNOSIS — C7951 Secondary malignant neoplasm of bone: Secondary | ICD-10-CM | POA: Diagnosis not present

## 2015-10-01 NOTE — Assessment & Plan Note (Addendum)
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  Now with significant disease progression in liver.  Oncology history is updated.  Labs today: CBC diff, CMET, CA27.29, and CA15-3.  I personally reviewed and went over laboratory results with the patient.  The results are noted within this dictation.    MRI imaging was delayed due to patient not meeting weight requirements for MRI imaging locally.  As a result, she had to be rescheduled in [place].  I personally reviewed and went over radiographic studies with the patient.  The results are noted within this dictation.  MRI of liver demonstrates progression of disease in liver.  As a result of this progression, and mildly abnormal liver enzymes, she is a candidate for systemic chemotherapy, followed by maintenance Xeloda.  We discussed treatment options.  Together we discussed systemic chemotherapy upfront to improve disease burden due to quicker responses.  We discussed Paclitaxel chemotherapy in a day 1, 8, 15 every 28 day fashion x 4 cycles.  I discussed the risks, benefits, alternatives, and side effects of therapy including, but not limited to, flushing, edema, peripheral neuropathy, alopecia, rash, nausea, vomiting, diarrhea, mucositis, decreased blood counts, increased risk for infection, anaphylaxis, increased risk for infection, arthralgia/myalgia, and death.  She is agreeable to pursue this treatment option at this time.  She will discontinue Affinitor and exemestane.  She will be referred to chemotherapy teaching  She is agreeable to port placement.  She would like this placed locally.  I will call Dr. Davis/Dr. Arnoldo Morale to have port placed.  Pain contract in place with [facility].  Oxycodone as refilled today.  DO NOT RESUSCITATE.  DNR form filled out and provided to the patient on 10/24/2014.  Xgeva completed on 09/25/2015.  Oncology Flowsheet 09/25/2015  denosumab (XGEVA) [place] 120 mg    Labs day 1 of each cycle:  CBC diff, CMET, CA 27.29, CA15-3.  Sliding Scale: 151-200: 2 units 201-250: 4 units 251- 300: 6 units Greater than 300: 8 units and call MD.  Return on day 8 of cycle #1.

## 2015-10-01 NOTE — Telephone Encounter (Signed)
Called pt to tell her about Dr Marcello Moores appt for the port.  Its is 10-03-2015 at 10am.  Pt verbalized understanding.

## 2015-10-01 NOTE — Progress Notes (Signed)
START ON PATHWAY REGIMEN - Breast  BOS159: Paclitaxel 80 mg/m2 d1, d8, d15 q28 Days Until Progression or Unacceptable Toxicity   A cycle is every 28 days (3 weeks on and 1 week off):     Paclitaxel (Taxol(R)) 80 mg/m2 in 250 mL NS IV over 1 hour days 1, 8 and 15 (3 weeks on followed by 1 week off) Dose Mod: None  **Always confirm dose/schedule in your pharmacy ordering system**    Patient Characteristics: Metastatic Chemotherapy, HER2/neu Negative/Unknown/Equivocal, ER +, First Line AJCC Stage Grouping: IV Current Disease Status: Distant Metastases AJCC M Stage: X ER Status: Positive (+) AJCC N Stage: X AJCC T Stage: X HER2/neu: Negative (-) PR Status: Positive (+) Line of therapy: First Line Would you be surprised if this patient died  in the next year? I would NOT be surprised if this patient died in the next year  Intent of Therapy: Non-Curative / Palliative Intent, Discussed with Patient

## 2015-10-01 NOTE — Progress Notes (Signed)
      Stephen D Knowlton, MD [address] [place] [address]  Invasive ductal carcinoma of breast, unspecified laterality (HCC) - Plan: DNR (Do Not Resuscitate)  CURRENT THERAPY: Afinitor (5 mg) + Exemestane. Xgeva monthly.  INTERVAL HISTORY: Kaylee Mitchell 59 y.o. female returns for followup of Stage IV breast cancer, ER/PR+, HER2 neg, with new hepatic metastasis (biopsy proven), NOW WITH PROGRESSION OF DISEASE.    Invasive ductal carcinoma of breast (HCC)   05/26/2009 Initial Diagnosis    Needle core biopsy showed invasive mammary carcinoma with 1 + lymph node      06/03/2009 Surgery    Right modified radical mastectomy with 8/8 + lymph nodes by Dr. Brent Zeigler      07/22/2009 Cancer Staging    Bone scan + for multiple osseous lesions.  No disease elsewhere.      07/24/2009 - 03/23/2010 Chemotherapy    Arimidex      03/23/2010 Progression         03/23/2010 - 12/05/2012 Chemotherapy    Tamoxifen      11/28/2012 Progression    Worsening of lumbar spine osseous disease      12/06/2012 - 09/25/2014 Chemotherapy    Fulvestrant + Exemestane      01/24/2014 PET scan    No evidence of hypermetabolic recurrent or metastatic disease. Widespread osseous metastasis, without significant marrow hypermetabolism.      09/18/2014 Progression    PET- New hepatic metastasis within the LEFT hepatic lobe involving segments IVA, IVB, and III.      10/20/2014 - 11/13/2014 Chemotherapy    Afinitor + Exemestane      11/13/2014 Adverse Reaction    Increasing fatigue      11/13/2014 Treatment Plan Change    Hold Afinitor, will reduce dose to 7.5 mg daily.  Continue Exemestane and Xgeva.      11/24/2014 - 01/16/2015 Chemotherapy    Afinitor 7.5 mg daily, Exemestane daily, and continuation of Xgeva.      01/16/2015 Adverse Reaction    Cough, ?Afinitor-induced      01/16/2015 - 02/02/2015 Chemotherapy    HOLD AFFINITOR secondary to cough, weight loss, Continue EXEMESTANE  and Xgeva      01/30/2015 PET scan    Significant partial metabolic treatment response in liver metastases, nonenlarged R level 2 cervical LN, non specific, stable sclerotic osseous metastases throughout skeleton      02/02/2015 Adverse Reaction    Weight loss, decreased appetite      02/02/2015 Treatment Plan Change    Afinitor dose further reduced to 5 mg.      02/02/2015 -  Chemotherapy    Afinitor 5 mg and Exemestane with continuation of Xgeva      06/03/2015 Imaging    CT CAP- Hepatic metastatic disease appears to have improved slightly in the interval from 01/30/2015. Diffuse osseous metastatic disease.      08/21/2015 Imaging    CT CAP- Stable CT of the chest. No evidence for metastatic disease in the thorax. Overall, no substantial change in pre-existing liver lesions. There may be a new 10 mm lesion in the posterior right dome of liver, not seen previously.       09/30/2015 Imaging    MRI liver- 1. Innumerable (> than 30) confluent liver metastases throughout the liver, significantly better visualized at MRI compared to prior CT and PET-CT studies, which limits comparison. By best estimate, there has been progression of the liver metastases compared to the 06/03/2015   CT study. 2. Patchy lesions throughout the thoracolumbar spine, correlating with the known extensive sclerotic osseous metastatic disease as seen on multiple prior CT studies, not appreciably changed.      09/30/2015 Progression    MRI of liver demonstrates significant progression of disease and climbing CA 27.29 and CA 15-3.       She is here to review recent MRI scans. She denies any active complaints today. Weight continues to decline and she is down to 251.3 pounds.  She reports a recent "kidney infection" that was treated by her primary care physician. She notes that urinary symptoms have resolved.  Based upon imaging, she would benefit from change in therapy to systemic chemotherapy. She was  initially resistant to this option but after further discussion, she is agreeable.  Review of Systems  Constitutional: Positive for malaise/fatigue and weight loss. Negative for chills and fever.  HENT: Negative.   Eyes: Negative.  Negative for blurred vision and double vision.  Respiratory: Negative.  Negative for cough.   Cardiovascular: Negative.  Negative for chest pain.  Gastrointestinal: Positive for abdominal pain. Negative for constipation, diarrhea, nausea and vomiting.  Genitourinary: Negative.   Musculoskeletal: Negative.   Skin: Negative.   Neurological: Positive for weakness. Negative for headaches.  Endo/Heme/Allergies: Negative.   Psychiatric/Behavioral: Negative.     Past Medical History:  Diagnosis Date  . Bone metastasis (HCC) 09/13/2010  . Cancer (HCC)    rt breast is primary  . Depression   . Depression 12/14/2011  . Diabetes mellitus    type II  . DNR (do not resuscitate) 11/05/2014  . High cholesterol   . Hypertension   . Invasive ductal carcinoma of breast (HCC) 09/13/2010  . Knee pain, bilateral 2013  . Opioid contract exists with [facility] 10/24/2014   Contract found in Chart review in letter tab.    Past Surgical History:  Procedure Laterality Date  . ABDOMINAL HYSTERECTOMY    . KNEE SURGERY     right-arthroscopy  . MASTECTOMY     right side    Family History  Problem Relation Age of Onset  . Diabetes Mother   . Diabetes Father   . Arthritis    . Cancer    . Diabetes    . Cancer Maternal Aunt   . Anesthesia problems Neg Hx   . Hypotension Neg Hx   . Malignant hyperthermia Neg Hx   . Pseudochol deficiency Neg Hx     Social History   Social History  . Marital status: Single    Spouse name: N/A  . Number of children: N/A  . Years of education: 12   Occupational History  . disabled Unemployed   Social History Main Topics  . Smoking status: Never Smoker  . Smokeless tobacco: Never Used  . Alcohol use No  . Drug  use: No  . Sexual activity: Yes    Birth control/ protection: Surgical   Other Topics Concern  . None   Social History Narrative  . None     PHYSICAL EXAMINATION  ECOG PERFORMANCE STATUS: 1 - Symptomatic but completely ambulatory  Vitals:   10/01/15 0800  BP: (!) 146/97  Pulse: 95  Resp: 18  Temp: 98.1 F (36.7 C)    GENERAL:alert, no distress, comfortable, cooperative, obese, smiling and unaccompanied. SKIN: skin color, texture, turgor are normal, no rashes or significant lesions HEAD: Normocephalic, No masses, lesions, tenderness or abnormalities EYES: normal, Conjunctiva are pink and non-injected EARS: External ears normal   OROPHARYNX:lips, buccal mucosa, and tongue normal and mucous membranes are moist  NECK: supple, trachea midline LYMPH:  no palpable lymphadenopathy BREAST:not examined LUNGS: clear to auscultation and percussion HEART: regular rate & rhythm, no murmurs, no gallops, S1 normal and S2 normal ABDOMEN:abdomen soft, non-tender, obese and normal bowel sounds BACK: Back symmetric, no curvature. EXTREMITIES:less then 2 second capillary refill, no joint deformities, effusion, or inflammation, no skin discoloration, no cyanosis  NEURO: alert & oriented x 3 with fluent speech, no focal motor/sensory deficits, gait normal   LABORATORY DATA: CBC    Component Value Date/Time   WBC 7.0 09/25/2015 0755   RBC 4.78 09/25/2015 0755   HGB 12.9 09/25/2015 0755   HCT 39.9 09/25/2015 0755   PLT 178 09/25/2015 0755   MCV 83.5 09/25/2015 0755   MCH 27.0 09/25/2015 0755   MCHC 32.3 09/25/2015 0755   RDW 13.9 09/25/2015 0755   LYMPHSABS 2.5 09/25/2015 0755   MONOABS 0.5 09/25/2015 0755   EOSABS 0.1 09/25/2015 0755   BASOSABS 0.0 09/25/2015 0755      Chemistry      Component Value Date/Time   NA 136 09/25/2015 0755   K 4.1 09/25/2015 0755   CL 99 (L) 09/25/2015 0755   CO2 27 09/25/2015 0755   BUN 27 (H) 09/25/2015 0755   CREATININE 1.26 (H) 09/25/2015 0755       Component Value Date/Time   CALCIUM 9.6 09/25/2015 0755   ALKPHOS 220 (H) 09/25/2015 0755   AST 72 (H) 09/25/2015 0755   ALT 77 (H) 09/25/2015 0755   BILITOT 0.6 09/25/2015 0755     Lab Results  Component Value Date   LABCA2 84.5 (H) 09/25/2015      PENDING LABS:   RADIOGRAPHIC STUDIES:  Mr Abdomen W Wo Contrast  Result Date: 09/30/2015 CLINICAL DATA:  Stage IV right breast cancer status post right mastectomy, presents for further evaluation of possible new liver lesion on recent CT study. EXAM: MRI ABDOMEN WITHOUT AND WITH CONTRAST TECHNIQUE: Multiplanar multisequence MR imaging of the abdomen was performed both before and after the administration of intravenous contrast. CONTRAST:  20mL MULTIHANCE GADOBENATE DIMEGLUMINE 529 MG/ML IV SOLN COMPARISON:  08/21/2015 CT chest, abdomen and pelvis. FINDINGS: Significantly motion degraded study. Lower chest: Clear lung bases. Hepatobiliary: The liver is mildly enlarged by numerous (greater than 30) confluent T2 hyperintense liver masses scattered throughout the liver of various sizes, which demonstrate heterogeneous hypoenhancement consistent with metastatic disease, and are significantly better visualized at MRI compared to the prior CT studies. Representative liver masses as follows: - segment 8 right liver lobe 2.3 x 1.7 cm mass (series 6/ image 10), which correlates with the newly visualized lesion in this location described on the 08/21/2015 CT report - segment 4A left liver lobe 5.1 x 3.6 cm mass (series 6/ image 15), not well visualized on recent CT studies, although subjectively increased since 06/03/2015 CT, where this lesion measured approximately 3.7 x 2.4 cm - segment 3 left liver lobe 2.2 x 2.2 cm mass (series 6/image 22), not well visualized on recent CT studies, although subjectively increased since 06/03/2015 CT, where this lesion measured approximately 1.2 x 1.2 cm No convincing hepatic steatosis. Normal gallbladder with no  cholelithiasis. No biliary ductal dilatation. Common bile duct diameter 4 mm. No choledocholithiasis. Pancreas: No pancreatic mass or duct dilation.  No pancreas divisum. Spleen: Normal size. No mass. Adrenals/Urinary Tract: Normal adrenals. No hydronephrosis. Normal kidneys with no renal mass. Stomach/Bowel: Small hiatal hernia. Otherwise grossly normal stomach.   Visualized small and large bowel is normal caliber, with no bowel wall thickening. Vascular/Lymphatic: Atherosclerotic nonaneurysmal abdominal aorta. Patent portal, splenic, hepatic and renal veins. No pathologically enlarged lymph nodes in the abdomen. Other: No abdominal ascites or focal fluid collection. Partially visualized right mastectomy. Musculoskeletal: Patchy osseous lesions throughout the visualized thoracolumbar spine correlate with the extensive patchy sclerotic lesions seen throughout the axial skeleton on the recent CT study, not appreciably changed. IMPRESSION: 1. Innumerable (> than 30) confluent liver metastases throughout the liver, significantly better visualized at MRI compared to prior CT and PET-CT studies, which limits comparison. By best estimate, there has been progression of the liver metastases compared to the 06/03/2015 CT study. 2. Patchy lesions throughout the thoracolumbar spine, correlating with the known extensive sclerotic osseous metastatic disease as seen on multiple prior CT studies, not appreciably changed. 3. Additional findings include aortic atherosclerosis and small hiatal hernia. Electronically Signed   By: Ilona Sorrel M.D.   On: 09/30/2015 13:28   Dg Hip Unilat With Pelvis 2-3 Views Right  Result Date: 09/23/2015 CLINICAL DATA:  No known injury.  Breast cancer.  Pain. EXAM: DG HIP (WITH OR WITHOUT PELVIS) 2-3V RIGHT COMPARISON:  CT 06/03/2015.  PET-CT 01/30/2015. FINDINGS: Multiple densities are noted throughout pelvis and proximal femurs consistent with blastic metastatic disease. No acute bony abnormality  identified. Peripheral vascular calcification. IMPRESSION: 1. Multiple densities are noted throughout the pelvis and both femurs. Findings consistent with blastic metastatic disease. Similar findings noted on prior exam. No acute bony abnormalities identified. 2. Peripheral vascular disease. Electronically Signed   By: Marcello Moores  Register   On: 09/23/2015 12:33   Dg Femur 1v Right  Result Date: 09/23/2015 CLINICAL DATA:  Pain.  Breast cancer.  No known injury. EXAM: RIGHT FEMUR 1 VIEW COMPARISON:  PET-CT 01/30/2015 . FINDINGS: Sclerotic densities noted in the proximal right femur consistent with metastatic disease. Similar findings on prior study.No acute bony abnormality . Peripheral vascular calcification IMPRESSION: 1. Sclerotic densities in the proximal right femur consistent with known metastatic disease. Similar findings noted on prior exam. No acute bony abnormality. 2.  Peripheral vascular disease Electronically Signed   By: Marcello Moores  Register   On: 09/23/2015 12:34     PATHOLOGY:    ASSESSMENT AND PLAN:  Invasive ductal carcinoma of breast Stage IV Breast cancer with bone involvement requiring Xgeva.  Now with significant disease progression in liver.  Oncology history is updated.  Labs today: CBC diff, CMET, CA27.29, and CA15-3.  I personally reviewed and went over laboratory results with the patient.  The results are noted within this dictation.    MRI imaging was delayed due to patient not meeting weight requirements for MRI imaging locally.  As a result, she had to be rescheduled in [place].  I personally reviewed and went over radiographic studies with the patient.  The results are noted within this dictation.  MRI of liver demonstrates progression of disease in liver.  As a result of this progression, and mildly abnormal liver enzymes, she is a candidate for systemic chemotherapy, followed by maintenance Xeloda.  We discussed treatment options.  Together we discussed systemic chemotherapy  upfront to improve disease burden due to quicker responses.  We discussed Paclitaxel chemotherapy in a day 1, 8, 15 every 28 day fashion x 4 cycles.  I discussed the risks, benefits, alternatives, and side effects of therapy including, but not limited to, flushing, edema, peripheral neuropathy, alopecia, rash, nausea, vomiting, diarrhea, mucositis, decreased blood counts, increased risk for infection,  anaphylaxis, increased risk for infection, arthralgia/myalgia, and death.  She is agreeable to pursue this treatment option at this time.  She will discontinue Affinitor and exemestane.  She will be referred to chemotherapy teaching  She is agreeable to port placement.  She would like this placed locally.  I will call Dr. Davis/Dr. Arnoldo Morale to have port placed.  Pain contract in place with [facility].  Oxycodone as refilled today.  DO NOT RESUSCITATE.  DNR form filled out and provided to the patient on 10/24/2014.  Xgeva completed on 09/25/2015.  Oncology Flowsheet 09/25/2015  denosumab (XGEVA) [place] 120 mg    Labs day 1 of each cycle: CBC diff, CMET, CA 27.29, CA15-3.  Sliding Scale: 151-200: 2 units 201-250: 4 units 251- 300: 6 units Greater than 300: 8 units and call MD.  Return on day 8 of cycle #1.   ORDERS PLACED FOR THIS ENCOUNTER: Orders Placed This Encounter  Procedures  . DNR (Do Not Resuscitate)    MEDICATIONS PRESCRIBED THIS ENCOUNTER: Meds ordered this encounter  Medications  . promethazine (PHENERGAN) 25 MG tablet  . sulfamethoxazole-trimethoprim (BACTRIM DS,SEPTRA DS) 800-160 MG tablet    THERAPY PLAN:  Continue Xgeva. We will transition to systemic chemotherapy.  All questions were answered. The patient knows to call the clinic with any problems, questions or concerns. We can certainly see the patient much sooner if necessary.  Patient and plan discussed with Dr. Ancil Linsey and she is in agreement with the aforementioned.   This note is  electronically signed by: Doy Mince 10/01/2015 10:03 PM

## 2015-10-01 NOTE — Patient Instructions (Signed)
[place] at [facility] Discharge Instructions  RECOMMENDATIONS MADE BY THE CONSULTANT AND ANY TEST RESULTS WILL BE SENT TO YOUR REFERRING PHYSICIAN.  You were seen by Gershon Mussel today Referral to Dr. Rosana Hoes for port placement Chemo teaching  [place] on day one of treatment Return in 1 week to 10 days post cycle 1 Call the clinic with any related concerns  Thank you for choosing [place] at [facility] to provide your oncology and hematology care.  To afford each patient quality time with our provider, please arrive at least 15 minutes before your scheduled appointment time.   Beginning January 23rd 2017 lab work for the [facility] will be done in the  Main lab at [facility] on 1st floor. If you have a lab appointment with the [place] please come in thru the  Main Entrance and check in at the main information desk  You need to re-schedule your appointment should you arrive 10 or more minutes late.  We strive to give you quality time with our providers, and arriving late affects you and other patients whose appointments are after yours.  Also, if you no show three or more times for appointments you may be dismissed from the clinic at the providers discretion.     Again, thank you for choosing [facility].  Our hope is that these requests will decrease the amount of time that you wait before being seen by our physicians.       _____________________________________________________________  Should you have questions after your visit to [facility], please contact our office at [phone] between the hours of 8:30 a.m. and 4:30 p.m.  Voicemails left after 4:30 p.m. will not be returned until the following business day.  For prescription refill requests, have your pharmacy contact our office.         Resources For Cancer Patients and their Caregivers ? American Cancer Society: Can assist with  transportation, wigs, general needs, runs Look Good Feel Better.        (952)325-6288 ? Cancer Care: Provides financial assistance, online support groups, medication/co-pay assistance.  1-800-813-HOPE 607-410-2571) ? [place] Assists [place] cancer patients and their families through emotional , educational and financial support.  636-818-5184 ? [place] DSS Where to apply for food stamps, Medicaid and utility assistance. 445-371-6229 ? [facility]: Transportation to medical appointments. 857-018-9926 ? Social Security Administration: May apply for disability if have a Stage IV cancer. 431-132-6695 985 710 4817 ? [employer], Disability and Transit Services: Assists with nutrition, care and transit needs. [place] Support Programs: @10RELATIVEDAYS @ > Cancer Support Group  2nd Tuesday of the month 1pm-2pm, Journey Room  > Creative Journey  3rd Tuesday of the month 1130am-1pm, Journey Room  > Look Good Feel Better  1st Wednesday of the month 10am-12 noon, Journey Room (Call [place] to register [phone])

## 2015-10-02 DIAGNOSIS — N10 Acute tubulo-interstitial nephritis: Secondary | ICD-10-CM | POA: Diagnosis not present

## 2015-10-02 DIAGNOSIS — E1065 Type 1 diabetes mellitus with hyperglycemia: Secondary | ICD-10-CM | POA: Diagnosis not present

## 2015-10-02 DIAGNOSIS — C50911 Malignant neoplasm of unspecified site of right female breast: Secondary | ICD-10-CM | POA: Diagnosis not present

## 2015-10-02 MED ORDER — PROCHLORPERAZINE MALEATE 10 MG PO TABS
10.0000 mg | ORAL_TABLET | Freq: Four times a day (QID) | ORAL | 1 refills | Status: DC | PRN
Start: 2015-10-02 — End: 2015-12-01

## 2015-10-02 MED ORDER — LIDOCAINE-PRILOCAINE 2.5-2.5 % EX CREA: TOPICAL_CREAM | CUTANEOUS | 3 refills | Status: DC

## 2015-10-02 MED ORDER — ONDANSETRON HCL 8 MG PO TABS: 8.0000 mg | ORAL_TABLET | Freq: Two times a day (BID) | ORAL | 1 refills | Status: DC | PRN

## 2015-10-02 NOTE — Patient Instructions (Addendum)
[place]   CHEMOTHERAPY INSTRUCTIONS  Plan:  Taxol Day 1, day 8, day 15 every 28 days   Premeds: Benadryl: help prevent any reaction to chemotherapy.  Pepcid: premed before taxol, antihistamine.  Dexamethasone - steroid - given to reduce the risk of you having an allergic type reaction to the chemotherapy. Dex can cause you to feel energized, nervous/anxious/jittery, make you have trouble sleeping, and/or make you feel hot/flushed in the face/neck and/or look pink/red in the face/neck. These side effects will pass as the Dex wears off. (takes 20 minutes to infuse)   Taxol - the first time you receive this drug we will titrate it very slowly to ensure that you do not have or are not having an infusional reaction to the chemo. Side Effects: hair loss, lowers your white blood cells (fight infection), muscle aches, nausea/vomiting, irritation to the mouth (mouth sores, pain in your mouth) *neuropathy - numbness/tingling/burning in hands/fingers/feet/toes. We need to know as soon as this begins to happen so that we can monitor it and treat if necessary. The numbness generally begins in the fingertips of tips of toes and then begins to travel up the finger/toe/hand/foot. We never want you getting to where you can't pick up a pen, coin, zip a zipper, button a button, or have trouble walking. You must tell us immediately if you are experiencing peripheral neuropathy! (the first time you receive this, it will take approximately 3 hours, after that it will only take 1 hour)    SELF IMAGE NEEDS AND REFERRALS MADE: Information on look good feel better given   EDUCATIONAL MATERIALS GIVEN AND REVIEWED: Chemotherapy and you book given, Nutrition book given   SELF CARE ACTIVITIES WHILE ON CHEMOTHERAPY: Increase your fluid intake 48 hours prior to treatment and drink at least 2 quarts (64 oz of water/decaff beverages) per day after treatment. No alcohol intake. No aspirin or other medications  unless approved by your oncologist. Eat foods that are light and easy to digest. Eat foods at cold or room temperature (as long as you aren't on the drug Oxaliplatin). No fried, fatty, or spicy foods immediately before or after treatment. Have teeth cleaned professionally before starting treatment. Keep dentures and partial plates clean. Use soft toothbrush and do not use mouthwashes that contain alcohol. Biotene is a good mouthwash that is available at most pharmacies or may be ordered by calling [phone]. Use warm salt water gargles (1 teaspoon salt per 1 quart warm water) before and after meals and at bedtime. Or you may rinse with 2 tablespoons of three-percent hydrogen peroxide mixed in eight ounces of water. Always use sunscreen that has not expired and with SPF (Sun Protection Factor) of 50 or higher. Wear hats to protect your head from the sun. Remember to use sunscreen on your hands, ears, face, & feet. Use your nausea medication as directed to prevent nausea. Use your stool softener or laxative as directed to prevent constipation. Use your anti-diarrheal medication as directed to stop diarrhea.   Please wash your hands for at least 30 seconds using warm soapy water. Handwashing is the #1 way to prevent the spread of germs. Stay away from sick people or people who are getting over a cold. If you develop respiratory systems such as green/yellow mucus production or productive cough or persistent cough let us know and we will see if you need an antibiotic. It is a good idea to keep a pair of gloves on when going into grocery stores/[employer]  to decrease your risk of coming into contact with germs on the carts, etc. Carry alcohol hand gel with you at all times and use it frequently if out in public. All foods need to be cooked thoroughly. No raw foods. No medium or undercooked meats, eggs. If your food is cooked medium well, it does not need to be hot pink or saturated with bloody liquid at all.  Vegetables and fruits need to be washed/rinsed under the faucet with a dish detergent before being consumed. You can eat raw fruits and vegetables unless we tell you otherwise but it would be best if you cooked them or bought frozen. Do not eat off of salad bars or hot bars unless you really trust the cleanliness of the restaurant. If you need dental work, please let Dr. Whitney Muse know before you go for your appointment so that we can coordinate the best possible time for you in regards to your chemo regimen. You need to also let your dentist know that you are actively taking chemo. We may need to do labs prior to your dental appointment. We also want your bowels moving at least every other day. If this is not happening, we need to know so that we can get you on a bowel regimen to help you go. If you are going to have sex, a condom must be used to protect the person that isn't taking chemotherapy. Chemo can decrease your libido (sex drive).    MEDICATIONS:  Zofran/Ondansetron 8mg  tablet. Take 1 tablet every 8 hours as needed for nausea/vomiting. (#1 nausea med to take, this can constipate)  Compazine/Prochlorperazine 10mg  tablet. Take 1 tablet every 6 hours as needed for nausea/vomiting. (#2 nausea med to take, this can make you sleepy)   EMLA cream. Apply a quarter size amount to port site 1 hour prior to chemo. Do not rub in. Cover with plastic wrap.   Over-the-Counter Meds:  Miralax 1 capful in 8 oz of fluid daily. May increase to two times a day if needed. This is a stool softener. If this doesn't work proceed you can add:  Senokot S  - start with 1 tablet two times a day and increase to 4 tablets two times a day if needed. (total of 8 tablets in a 24 hour period). This is a stimulant laxative.   Call us if this does not help your bowels move.   Imodium 2mg  capsule. Take 2 capsules after the 1st loose stool and then 1 capsule every 2 hours until you go a total of 12 hours without having a  loose stool. Call [facility] if loose stools continue. If diarrhea occurs @ bedtime, take 2 capsules @ bedtime. Then take 2 capsules every 4 hours until morning. Call [facility].     (Please refer to/review other teaching materials that have been provided to you in this blue folder - What to Know During Chemo, What to know After Chemo, Dr. Donald Pore Advice, Constipation Sheet, Diarrhea Sheet, Nausea Sheet, Self Care Activities While on Chemo    SYMPTOMS TO REPORT AS SOON AS POSSIBLE AFTER TREATMENT:  FEVER GREATER THAN 100.5 F  CHILLS WITH OR WITHOUT FEVER  NAUSEA AND VOMITING THAT IS NOT CONTROLLED WITH YOUR NAUSEA MEDICATION  UNUSUAL SHORTNESS OF BREATH  UNUSUAL BRUISING OR BLEEDING  TENDERNESS IN MOUTH AND THROAT WITH OR WITHOUT PRESENCE OF ULCERS  URINARY PROBLEMS  BOWEL PROBLEMS  UNUSUAL RASH    Wear comfortable clothing and clothing appropriate for easy access to  any Portacath or PICC line. Let us know if there is anything that we can do to make your therapy better!      I have been informed and understand all of the instructions given to me and have received a copy. I have been instructed to call the clinic [phone])  or my family physician as soon as possible for continued medical care, if indicated. I do not have any more questions at this time but understand that I may call the [facility] at ([phone]) during office hours should I have questions or need assistance in obtaining follow-up care.        Paclitaxel injection What is this medicine? PACLITAXEL (PAK li TAX el) is a chemotherapy drug. It targets fast dividing cells, like cancer cells, and causes these cells to die. This medicine is used to treat ovarian cancer, breast cancer, and other cancers. This medicine may be used for other purposes; ask your health care provider or pharmacist if you have questions. What should I tell my health care provider before I take this medicine? They need to know if  you have any of these conditions: -blood disorders -irregular heartbeat -infection (especially a virus infection such as chickenpox, cold sores, or herpes) -liver disease -previous or ongoing radiation therapy -an unusual or allergic reaction to paclitaxel, alcohol, polyoxyethylated castor oil, other chemotherapy agents, other medicines, foods, dyes, or preservatives -pregnant or trying to get pregnant -breast-feeding How should I use this medicine? This drug is given as an infusion into a vein. It is administered in a hospital or clinic by a specially trained health care professional. Talk to your pediatrician regarding the use of this medicine in children. Special care may be needed. Overdosage: If you think you have taken too much of this medicine contact a poison control center or emergency room at once. NOTE: This medicine is only for you. Do not share this medicine with others. What if I miss a dose? It is important not to miss your dose. Call your doctor or health care professional if you are unable to keep an appointment. What may interact with this medicine? Do not take this medicine with any of the following medications: -disulfiram -metronidazole This medicine may also interact with the following medications: -cyclosporine -diazepam -ketoconazole -medicines to increase blood counts like filgrastim, pegfilgrastim, sargramostim -other chemotherapy drugs like cisplatin, doxorubicin, epirubicin, etoposide, teniposide, vincristine -quinidine -testosterone -vaccines -verapamil Talk to your doctor or health care professional before taking any of these medicines: -acetaminophen -aspirin -ibuprofen -ketoprofen -naproxen This list may not describe all possible interactions. Give your health care provider a list of all the medicines, herbs, non-prescription drugs, or dietary supplements you use. Also tell them if you smoke, drink alcohol, or use illegal drugs. Some items may  interact with your medicine. What should I watch for while using this medicine? Your condition will be monitored carefully while you are receiving this medicine. You will need important blood work done while you are taking this medicine. This drug may make you feel generally unwell. This is not uncommon, as chemotherapy can affect healthy cells as well as cancer cells. Report any side effects. Continue your course of treatment even though you feel ill unless your doctor tells you to stop. This medicine can cause serious allergic reactions. To reduce your risk you will need to take other medicine(s) before treatment with this medicine. In some cases, you may be given additional medicines to help with side effects. Follow all directions for their use.  Call your doctor or health care professional for advice if you get a fever, chills or sore throat, or other symptoms of a cold or flu. Do not treat yourself. This drug decreases your body's ability to fight infections. Try to avoid being around people who are sick. This medicine may increase your risk to bruise or bleed. Call your doctor or health care professional if you notice any unusual bleeding. Be careful brushing and flossing your teeth or using a toothpick because you may get an infection or bleed more easily. If you have any dental work done, tell your dentist you are receiving this medicine. Avoid taking products that contain aspirin, acetaminophen, ibuprofen, naproxen, or ketoprofen unless instructed by your doctor. These medicines may hide a fever. Do not become pregnant while taking this medicine. Women should inform their doctor if they wish to become pregnant or think they might be pregnant. There is a potential for serious side effects to an unborn child. Talk to your health care professional or pharmacist for more information. Do not breast-feed an infant while taking this medicine. Men are advised not to father a child while receiving this  medicine. This product may contain alcohol. Ask your pharmacist or healthcare provider if this medicine contains alcohol. Be sure to tell all healthcare providers you are taking this medicine. Certain medicines, like metronidazole and disulfiram, can cause an unpleasant reaction when taken with alcohol. The reaction includes flushing, headache, nausea, vomiting, sweating, and increased thirst. The reaction can last from 30 minutes to several hours. What side effects may I notice from receiving this medicine? Side effects that you should report to your doctor or health care professional as soon as possible: -allergic reactions like skin rash, itching or hives, swelling of the face, lips, or tongue -low blood counts - This drug may decrease the number of white blood cells, red blood cells and platelets. You may be at increased risk for infections and bleeding. -signs of infection - fever or chills, cough, sore throat, pain or difficulty passing urine -signs of decreased platelets or bleeding - bruising, pinpoint red spots on the skin, black, tarry stools, nosebleeds -signs of decreased red blood cells - unusually weak or tired, fainting spells, lightheadedness -breathing problems -chest pain -high or low blood pressure -mouth sores -nausea and vomiting -pain, swelling, redness or irritation at the injection site -pain, tingling, numbness in the hands or feet -slow or irregular heartbeat -swelling of the ankle, feet, hands Side effects that usually do not require medical attention (report to your doctor or health care professional if they continue or are bothersome): -bone pain -complete hair loss including hair on your head, underarms, pubic hair, eyebrows, and eyelashes -changes in the color of fingernails -diarrhea -loosening of the fingernails -loss of appetite -muscle or joint pain -red flush to skin -sweating This list may not describe all possible side effects. Call your doctor for  medical advice about side effects. You may report side effects to FDA at 1-800-FDA-1088. Where should I keep my medicine? This drug is given in a hospital or clinic and will not be stored at home. NOTE: This sheet is a summary. It may not cover all possible information. If you have questions about this medicine, talk to your doctor, pharmacist, or health care provider.    2016, Elsevier/Gold Standard. (2014-08-21 13:02:56)

## 2015-10-05 ENCOUNTER — Encounter (HOSPITAL_COMMUNITY): Payer: Self-pay | Admitting: Emergency Medicine

## 2015-10-05 NOTE — Progress Notes (Signed)
10-02-2015 Teaching pulled together on taxol, pre-meds sent to [facility] in [place], labs entered, chemo/doctor schedule made.

## 2015-10-06 NOTE — H&P (Signed)
  NTS SOAP Note  Vital Signs:  Vitals as of: [id number]: Systolic 123XX123: Diastolic 97: Heart Rate 92: Temp 96.85F (Temporal): Height 102ft 5in: Weight 251Lbs 0 Ounces: BMI 41.77   BMI : 41.77 kg/m2  Subjective: This 60 year old female presents for of need for portacath insertion.  Has metastatic breast cancer and is in need of central venous access.  Referred by Dr. Whitney Muse.  Review of Symptoms:  Constitutional:negative Head:negative Eyes:negative Nose/Mouth/Throat:negative Cardiovascular:negative Respiratory:negative Gastrointestinabdominal pain Genitourinary:negative joint pain Skin:negative Hematolgic/Lymphatic:negative Allergic/Immunologic:negative   Past Medical History:Reviewed  Past Medical History  Surgical History: right mastectomy 2011, knee surgery, TAH Medical Problems: metastatic breast cancer to bone, depression, NIDDM, high cholesterol, HTN Allergies: nkda Medications: losartan/HCTZ, metformin, insulin, omeprazole, oxycodone, kcl, megace, phenergan, effexor, xgeva, ventolin   Social History:Reviewed  Social History  Preferred Language: English Race:  Black or African American Ethnicity: Not Hispanic / Latino Age: 52 year Marital Status:  S Alcohol: no   Smoking Status: Never smoker reviewed on 10/06/2015 Functional Status reviewed on 10/06/2015 ------------------------------------------------ Bathing: Normal Cooking: Normal Dressing: Normal Driving: Normal Eating: Normal Managing Meds: Normal Oral Care: Normal Shopping: Normal Toileting: Normal Transferring: Normal Walking: Normal Cognitive Status reviewed on 10/06/2015 ------------------------------------------------ Attention: Normal Decision Making: Normal Language: Normal Memory: Normal Motor: Normal Perception: Normal Problem Solving: Normal Visual and Spatial: Normal   Family History:Reviewed  Family Health History Mother, Living; Diabetes mellitus,  unspecified type;  Father, Living; Diabetes mellitus, unspecified type;     Objective Information: General:Well appearing, well nourished in no distress. Skin:no rash or prominent lesions Head:Atraumatic; no masses; no abnormalities Neck:Supple without lymphadenopathy.  Heart:RRR, no murmur or gallop.  Normal S1, S2.  No S3, S4.  Lungs:CTA bilaterally, no wheezes, rhonchi, rales.  Breathing unlabored. Dr. Donald Pore note reviewed. Assessment:Metastatic breast cancer, need for central venous access  Diagnoses: 174.9  C50.919 Primary malignant neoplasm of female breast (Malignant neoplasm of unspecified site of unspecified female breast)  Procedures: [phone] - OFFICE OUTPATIENT NEW 30 MINUTES    Plan:  Scheduled for portacath insertion on 10/09/15.   Patient Education:Alternative treatments to surgery were discussed with patient (and family).Risks and benefits  of procedure including bleeding, infection, and pneumothorax were fully explained to the patient (and family) who gave informed consent. Patient/family questions were addressed.  Follow-up:Pending Surgery

## 2015-10-06 NOTE — Patient Instructions (Signed)
Kaylee Mitchell  10/06/2015     @PREFPERIOPPHARMACY @   Your procedure is scheduled on 10/09/2015.  Report to [facility] at 7:00 A.M.  Call this number if you have problems the morning of surgery:  [phone]   Remember:  Do not eat food or drink liquids after midnight.  Take these medicines the morning of surgery with A SIP OF WATER Xanax, Coreg, gabapentin, Ativan, Megace, Prilosec, Zofran, Compazine or Phenergan if  needed, Zoloft, Effexor  DO NOT TAKE DIABETIC MEDICATION MORNING OF PROCEDURE  TAKE ONLY 1/2 DOSE OF EVENING INSULIN NIGHT PRIOR TO PROCEDURE   Do not wear jewelry, make-up or nail polish.  Do not wear lotions, powders, or perfumes, or deoderant.  Do not shave 48 hours prior to surgery.  Men may shave face and neck.  Do not bring valuables to the hospital.  [facility] is not responsible for any belongings or valuables.  Contacts, dentures or bridgework may not be worn into surgery.  Leave your suitcase in the car.  After surgery it may be brought to your room.  For patients admitted to the hospital, discharge time will be determined by your treatment team.  Patients discharged the day of surgery will not be allowed to drive home.    Please read over the following fact sheets that you were given. Surgical Site Infection Prevention and Anesthesia Post-op Instructions     PATIENT INSTRUCTIONS POST-ANESTHESIA  IMMEDIATELY FOLLOWING SURGERY:  Do not drive or operate machinery for the first twenty four hours after surgery.  Do not make any important decisions for twenty four hours after surgery or while taking narcotic pain medications or sedatives.  If you develop intractable nausea and vomiting or a severe headache please notify your doctor immediately.  FOLLOW-UP:  Please make an appointment with your surgeon as instructed. You do not need to follow up with anesthesia unless specifically instructed to do so.  WOUND CARE INSTRUCTIONS (if applicable):  Keep a dry  clean dressing on the anesthesia/puncture wound site if there is drainage.  Once the wound has quit draining you may leave it open to air.  Generally you should leave the bandage intact for twenty four hours unless there is drainage.  If the epidural site drains for more than 36-48 hours please call the anesthesia department.  QUESTIONS?:  Please feel free to call your physician or the hospital operator if you have any questions, and they will be happy to assist you.      Implanted Port Insertion An implanted port is a central line that has a round shape and is placed under the skin. It is used as a long-term IV access for:   Medicines, such as chemotherapy.   Fluids.   Liquid nutrition, such as total parenteral nutrition (TPN).   Blood samples.  LET [facility] CARE PROVIDER KNOW ABOUT:  Allergies to food or medicine.   Medicines taken, including vitamins, herbs, eye drops, creams, and over-the-counter medicines.   Any allergies to heparin.  Use of steroids (by mouth or creams).   Previous problems with anesthetics or numbing medicines.   History of bleeding problems or blood clots.   Previous surgery.   Other health problems, including diabetes and kidney problems.   Possibility of pregnancy, if this applies. RISKS AND COMPLICATIONS Generally, this is a safe procedure. However, as with any procedure, problems can occur. Possible problems include:  Damage to the blood vessel, bruising, or bleeding at the puncture site.   Infection.  Blood clot in the vessel that the port is in.  Breakdown of the skin over your port.  Very rarely a person may develop a condition called a pneumothorax, a collection of air in the chest that may cause one of the lungs to collapse. The placement of these catheters with the appropriate imaging guidance significantly decreases the risk of a pneumothorax.  BEFORE THE PROCEDURE   Your health care provider may want you to have  blood tests. These tests can help tell how well your kidneys and liver are working. They can also show how well your blood clots.   If you take blood thinners (anticoagulant medicines), ask your health care provider when you should stop taking them.   Make arrangements for someone to drive you home. This is necessary if you have been sedated for your procedure.  PROCEDURE  Port insertion usually takes about 30-45 minutes.   An IV needle will be inserted in your arm. Medicine for pain and medicine to help relax you (sedative) will flow directly into your body through this needle.   You will lie on an exam table, and you will be connected to monitors to keep track of your heart rate, blood pressure, and breathing throughout the procedure.  An oxygen monitoring device may be attached to your finger. Oxygen will be given.   Everything will be kept as germ free (sterile) as possible during the procedure. The skin near the point of the incision will be cleansed with antiseptic, and the area will be draped with sterile towels. The skin and deeper tissues over the port area will be made numb with a local anesthetic.  Two small cuts (incisions) will be made in the skin to insert the port. One will be made in the neck to obtain access to the vein where the catheter will lie.   Because the port reservoir will be placed under the skin, a small skin incision will be made in the upper chest, and a small pocket for the port will be made under the skin. The catheter that will be connected to the port tunnels to a large central vein in the chest. A small, raised area will remain on your body at the site of the reservoir when the procedure is complete.  The port placement will be done under imaging guidance to ensure the proper placement.  The reservoir has a silicone covering that can be punctured with a special needle.   The port will be flushed with normal saline, and blood will be drawn to make  sure it is working properly.  There will be nothing remaining outside the skin when the procedure is finished.   Incisions will be held together by stitches, surgical glue, or a special tape. AFTER THE PROCEDURE  You will stay in a recovery area until the anesthesia has worn off. Your blood pressure and pulse will be checked.  A final chest X-ray will be taken to check the placement of the port and to ensure that there is no injury to your lung.   This information is not intended to replace advice given to you by your health care provider. Make sure you discuss any questions you have with your health care provider.   Document Released: 10/24/2012 Document Revised: 01/24/2014 Document Reviewed: 10/24/2012 Elsevier Interactive Patient Education [employer].

## 2015-10-07 ENCOUNTER — Encounter (HOSPITAL_COMMUNITY): Payer: Self-pay

## 2015-10-07 ENCOUNTER — Encounter (HOSPITAL_COMMUNITY): Payer: Self-pay | Admitting: Emergency Medicine

## 2015-10-07 ENCOUNTER — Encounter (HOSPITAL_COMMUNITY)
Admission: RE | Admit: 2015-10-07 | Discharge: 2015-10-07 | Disposition: A | Discharge status: Home / Self Care | Payer: Medicare Other | Source: Ambulatory Visit | Attending: General Surgery | Admitting: General Surgery

## 2015-10-07 ENCOUNTER — Other Ambulatory Visit: Payer: Self-pay

## 2015-10-07 ENCOUNTER — Encounter (HOSPITAL_COMMUNITY): Payer: Medicare Other

## 2015-10-07 DIAGNOSIS — C50919 Malignant neoplasm of unspecified site of unspecified female breast: Secondary | ICD-10-CM

## 2015-10-07 DIAGNOSIS — Z9011 Acquired absence of right breast and nipple: Secondary | ICD-10-CM | POA: Diagnosis not present

## 2015-10-07 DIAGNOSIS — C787 Secondary malignant neoplasm of liver and intrahepatic bile duct: Secondary | ICD-10-CM

## 2015-10-07 DIAGNOSIS — F329 Major depressive disorder, single episode, unspecified: Secondary | ICD-10-CM | POA: Diagnosis not present

## 2015-10-07 DIAGNOSIS — C7951 Secondary malignant neoplasm of bone: Secondary | ICD-10-CM | POA: Diagnosis not present

## 2015-10-07 DIAGNOSIS — I10 Essential (primary) hypertension: Secondary | ICD-10-CM | POA: Diagnosis not present

## 2015-10-07 DIAGNOSIS — Z79899 Other long term (current) drug therapy: Secondary | ICD-10-CM | POA: Diagnosis not present

## 2015-10-07 DIAGNOSIS — E119 Type 2 diabetes mellitus without complications: Secondary | ICD-10-CM | POA: Diagnosis not present

## 2015-10-07 DIAGNOSIS — Z6841 Body Mass Index (BMI) 40.0 and over, adult: Secondary | ICD-10-CM | POA: Diagnosis not present

## 2015-10-07 DIAGNOSIS — Z853 Personal history of malignant neoplasm of breast: Secondary | ICD-10-CM | POA: Diagnosis not present

## 2015-10-07 DIAGNOSIS — E7800 Pure hypercholesterolemia, unspecified: Secondary | ICD-10-CM | POA: Diagnosis not present

## 2015-10-07 DIAGNOSIS — Z794 Long term (current) use of insulin: Secondary | ICD-10-CM | POA: Diagnosis not present

## 2015-10-07 HISTORY — DX: Gastro-esophageal reflux disease without esophagitis: K21.9

## 2015-10-07 HISTORY — DX: Unspecified osteoarthritis, unspecified site: M19.90

## 2015-10-07 NOTE — Progress Notes (Signed)
Went with pt to the journey room to pick out wig.

## 2015-10-07 NOTE — Pre-Procedure Instructions (Signed)
Patient given information to sign up for my chart at home. 

## 2015-10-09 ENCOUNTER — Ambulatory Visit (HOSPITAL_COMMUNITY): Payer: Medicare Other

## 2015-10-09 ENCOUNTER — Ambulatory Visit (HOSPITAL_COMMUNITY): Payer: Medicare Other | Admitting: Anesthesiology

## 2015-10-09 ENCOUNTER — Encounter (HOSPITAL_COMMUNITY)
Admission: RE | Disposition: A | Discharge status: Home / Self Care | Payer: Self-pay | Source: Ambulatory Visit | Attending: General Surgery

## 2015-10-09 ENCOUNTER — Encounter (HOSPITAL_COMMUNITY): Payer: Self-pay | Admitting: *Deleted

## 2015-10-09 ENCOUNTER — Ambulatory Visit (HOSPITAL_COMMUNITY)
Admission: RE | Admit: 2015-10-09 | Discharge: 2015-10-09 | Disposition: A | Discharge status: Home / Self Care | Payer: Medicare Other | Source: Ambulatory Visit | Attending: General Surgery | Admitting: General Surgery

## 2015-10-09 DIAGNOSIS — C50919 Malignant neoplasm of unspecified site of unspecified female breast: Secondary | ICD-10-CM | POA: Diagnosis not present

## 2015-10-09 DIAGNOSIS — Z79899 Other long term (current) drug therapy: Secondary | ICD-10-CM | POA: Insufficient documentation

## 2015-10-09 DIAGNOSIS — Z9011 Acquired absence of right breast and nipple: Secondary | ICD-10-CM | POA: Diagnosis not present

## 2015-10-09 DIAGNOSIS — F329 Major depressive disorder, single episode, unspecified: Secondary | ICD-10-CM | POA: Diagnosis not present

## 2015-10-09 DIAGNOSIS — E7800 Pure hypercholesterolemia, unspecified: Secondary | ICD-10-CM | POA: Insufficient documentation

## 2015-10-09 DIAGNOSIS — C7951 Secondary malignant neoplasm of bone: Secondary | ICD-10-CM

## 2015-10-09 DIAGNOSIS — I10 Essential (primary) hypertension: Secondary | ICD-10-CM | POA: Diagnosis not present

## 2015-10-09 DIAGNOSIS — Z794 Long term (current) use of insulin: Secondary | ICD-10-CM | POA: Insufficient documentation

## 2015-10-09 DIAGNOSIS — Z853 Personal history of malignant neoplasm of breast: Secondary | ICD-10-CM | POA: Diagnosis not present

## 2015-10-09 DIAGNOSIS — E119 Type 2 diabetes mellitus without complications: Secondary | ICD-10-CM | POA: Insufficient documentation

## 2015-10-09 DIAGNOSIS — Z95828 Presence of other vascular implants and grafts: Secondary | ICD-10-CM

## 2015-10-09 DIAGNOSIS — Z452 Encounter for adjustment and management of vascular access device: Secondary | ICD-10-CM | POA: Diagnosis not present

## 2015-10-09 DIAGNOSIS — Z6841 Body Mass Index (BMI) 40.0 and over, adult: Secondary | ICD-10-CM | POA: Insufficient documentation

## 2015-10-09 HISTORY — PX: PORTACATH PLACEMENT: SHX2246

## 2015-10-09 LAB — GLUCOSE, CAPILLARY
GLUCOSE-CAPILLARY: 320 mg/dL — AB (ref 65–99)
GLUCOSE-CAPILLARY: 291 mg/dL — AB (ref 65–99)
GLUCOSE-CAPILLARY: 286 mg/dL — AB (ref 65–99)
GLUCOSE-CAPILLARY: 290 mg/dL — AB (ref 65–99)
Glucose-Capillary: 332 mg/dL — ABNORMAL HIGH (ref 65–99)

## 2015-10-09 SURGERY — INSERTION, TUNNELED CENTRAL VENOUS DEVICE, WITH PORT
Anesthesia: Monitor Anesthesia Care

## 2015-10-09 MED ORDER — INSULIN ASPART 100 UNIT/ML ~~LOC~~ SOLN
7.0000 [IU] | Freq: Once | SUBCUTANEOUS | Status: AC
  Administered 2015-10-09: 7 [IU] via SUBCUTANEOUS
  Filled 2015-10-09: qty 0.07

## 2015-10-09 MED ORDER — FENTANYL CITRATE (PF) 100 MCG/2ML IJ SOLN
INTRAMUSCULAR | Status: AC
  Filled 2015-10-09: qty 2

## 2015-10-09 MED ORDER — HEPARIN SOD (PORK) LOCK FLUSH 100 UNIT/ML IV SOLN
INTRAVENOUS | Status: AC
  Filled 2015-10-09: qty 5

## 2015-10-09 MED ORDER — PROPOFOL 10 MG/ML IV BOLUS
INTRAVENOUS | Status: AC
  Filled 2015-10-09: qty 20

## 2015-10-09 MED ORDER — PROPOFOL 500 MG/50ML IV EMUL
INTRAVENOUS | Status: DC | PRN
  Administered 2015-10-09: 75 ug/kg/min via INTRAVENOUS

## 2015-10-09 MED ORDER — CEFAZOLIN SODIUM-DEXTROSE 2-4 GM/100ML-% IV SOLN
2.0000 g | INTRAVENOUS | Status: AC
  Administered 2015-10-09: 2 g via INTRAVENOUS
  Filled 2015-10-09: qty 100

## 2015-10-09 MED ORDER — MIDAZOLAM HCL 2 MG/2ML IJ SOLN
INTRAMUSCULAR | Status: AC
  Filled 2015-10-09: qty 2

## 2015-10-09 MED ORDER — PROPOFOL 10 MG/ML IV BOLUS
INTRAVENOUS | Status: DC | PRN
  Administered 2015-10-09: 10 mg via INTRAVENOUS

## 2015-10-09 MED ORDER — INSULIN ASPART 100 UNIT/ML ~~LOC~~ SOLN
10.0000 [IU] | Freq: Once | SUBCUTANEOUS | Status: AC
Start: 2015-10-09 — End: 2015-10-09
  Administered 2015-10-09: 10 [IU] via SUBCUTANEOUS

## 2015-10-09 MED ORDER — LIDOCAINE HCL (PF) 1 % IJ SOLN
INTRAMUSCULAR | Status: DC | PRN
  Administered 2015-10-09: 8 mL

## 2015-10-09 MED ORDER — LIDOCAINE HCL (PF) 1 % IJ SOLN
INTRAMUSCULAR | Status: AC
Start: 2015-10-09 — End: 2015-10-09
  Filled 2015-10-09: qty 5

## 2015-10-09 MED ORDER — HYDROMORPHONE HCL 1 MG/ML IJ SOLN: 0.2500 mg | INTRAMUSCULAR | Status: DC | PRN

## 2015-10-09 MED ORDER — KETOROLAC TROMETHAMINE 30 MG/ML IJ SOLN
30.0000 mg | Freq: Once | INTRAMUSCULAR | Status: AC
  Administered 2015-10-09: 30 mg via INTRAVENOUS
  Filled 2015-10-09: qty 1

## 2015-10-09 MED ORDER — LACTATED RINGERS IV SOLN
INTRAVENOUS | Status: DC
  Administered 2015-10-09: 08:00:00 via INTRAVENOUS

## 2015-10-09 MED ORDER — HEPARIN SOD (PORK) LOCK FLUSH 100 UNIT/ML IV SOLN
INTRAVENOUS | Status: DC | PRN
  Administered 2015-10-09: 500 [IU] via INTRAVENOUS

## 2015-10-09 MED ORDER — FENTANYL CITRATE (PF) 100 MCG/2ML IJ SOLN
25.0000 ug | INTRAMUSCULAR | Status: AC | PRN
  Administered 2015-10-09 (×2): 25 ug via INTRAVENOUS

## 2015-10-09 MED ORDER — MIDAZOLAM HCL 2 MG/2ML IJ SOLN
1.0000 mg | INTRAMUSCULAR | Status: DC | PRN
  Administered 2015-10-09: 2 mg via INTRAVENOUS

## 2015-10-09 MED ORDER — SODIUM CHLORIDE 0.9 % IJ SOLN
INTRAMUSCULAR | Status: DC | PRN
  Administered 2015-10-09: 500 mL via INTRAVENOUS

## 2015-10-09 MED ORDER — LIDOCAINE HCL (PF) 1 % IJ SOLN
INTRAMUSCULAR | Status: AC
  Filled 2015-10-09: qty 30

## 2015-10-09 SURGICAL SUPPLY — 36 items
APPLIER CLIP 9.375 SM OPEN (CLIP)
BAG DECANTER FOR FLEXI CONT (MISCELLANEOUS) ×3 IMPLANT
BAG HAMPER (MISCELLANEOUS) ×3 IMPLANT
CATH HICKMAN DUAL 12.0 (CATHETERS) IMPLANT
CHLORAPREP W/TINT 10.5 ML (MISCELLANEOUS) ×3 IMPLANT
CLIP APPLIE 9.375 SM OPEN (CLIP) IMPLANT
CLOTH BEACON ORANGE TIMEOUT ST (SAFETY) ×3 IMPLANT
COVER LIGHT HANDLE STERIS (MISCELLANEOUS) ×6 IMPLANT
DECANTER SPIKE VIAL GLASS SM (MISCELLANEOUS) ×3 IMPLANT
DERMABOND ADVANCED (GAUZE/BANDAGES/DRESSINGS) ×2
DERMABOND ADVANCED .7 DNX12 (GAUZE/BANDAGES/DRESSINGS) ×1 IMPLANT
DRAPE C-ARM FOLDED MOBILE STRL (DRAPES) ×3 IMPLANT
ELECT REM PT RETURN 9FT ADLT (ELECTROSURGICAL) ×3
ELECTRODE REM PT RTRN 9FT ADLT (ELECTROSURGICAL) ×1 IMPLANT
GLOVE BIOGEL PI IND STRL 7.0 (GLOVE) ×1 IMPLANT
GLOVE BIOGEL PI INDICATOR 7.0 (GLOVE) ×2
GLOVE ECLIPSE 6.5 STRL STRAW (GLOVE) ×3 IMPLANT
GLOVE SURG SS PI 7.5 STRL IVOR (GLOVE) ×3 IMPLANT
GOWN STRL REUS W/TWL LRG LVL3 (GOWN DISPOSABLE) ×6 IMPLANT
IV NS 500ML (IV SOLUTION) ×2
IV NS 500ML BAXH (IV SOLUTION) ×1 IMPLANT
KIT PORT POWER 8FR ISP MRI (Port) ×3 IMPLANT
KIT ROOM TURNOVER APOR (KITS) ×3 IMPLANT
MANIFOLD NEPTUNE II (INSTRUMENTS) ×3 IMPLANT
NEEDLE HYPO 25X1 1.5 SAFETY (NEEDLE) ×3 IMPLANT
PACK MINOR (CUSTOM PROCEDURE TRAY) ×3 IMPLANT
PAD ARMBOARD 7.5X6 YLW CONV (MISCELLANEOUS) ×3 IMPLANT
SET BASIN LINEN APH (SET/KITS/TRAYS/PACK) ×3 IMPLANT
SET INTRODUCER 12FR PACEMAKER (SHEATH) IMPLANT
SHEATH COOK PEEL AWAY SET 8F (SHEATH) IMPLANT
SUT PROLENE 3 0 PS 2 (SUTURE) IMPLANT
SUT VIC AB 3-0 SH 27 (SUTURE) ×2
SUT VIC AB 3-0 SH 27X BRD (SUTURE) ×1 IMPLANT
SUT VIC AB 4-0 PS2 27 (SUTURE) ×3 IMPLANT
SYR 20CC LL (SYRINGE) ×3 IMPLANT
SYR CONTROL 10ML LL (SYRINGE) ×3 IMPLANT

## 2015-10-09 NOTE — Transfer of Care (Signed)
Immediate Anesthesia Transfer of Care Note  Patient: Kaylee Mitchell  Procedure(s) Performed: Procedure(s): INSERTION PORT-A-CATH LEFT SUBCLAVIAN  Patient Location: PACU  Anesthesia Type:MAC  Level of Consciousness: awake, alert  and oriented  Airway & Oxygen Therapy: Patient Spontanous Breathing  Post-op Assessment: Report given to RN  Post vital signs: Reviewed and stable  Last Vitals:  Vitals:   10/09/15 0810 10/09/15 0820  BP: 137/83   Pulse:    Resp: 16 20  Temp:      Last Pain:  Vitals:   10/09/15 0729  TempSrc: Oral      Patients Stated Pain Goal: 8 ([id number])  Complications: No apparent anesthesia complications

## 2015-10-09 NOTE — Anesthesia Postprocedure Evaluation (Signed)
Anesthesia Post Note  Patient: Kaylee Mitchell  Procedure(s) Performed: Procedure(s): INSERTION PORT-A-CATH LEFT SUBCLAVIAN  Patient location during evaluation: Short Stay Anesthesia Type: MAC Level of consciousness: awake and alert Pain management: pain level controlled Vital Signs Assessment: post-procedure vital signs reviewed and stable Respiratory status: spontaneous breathing Cardiovascular status: stable Anesthetic complications: no    Last Vitals:  Vitals:   10/09/15 0930 10/09/15 1011  BP: (!) 146/105 (!) 148/79  Pulse: 85 92  Resp: 11 16  Temp:  36.8 C    Last Pain:  Vitals:   10/09/15 1011  TempSrc: Oral  PainSc:                  Drucie Opitz

## 2015-10-09 NOTE — Progress Notes (Signed)
Diet ginger-ale given to drink. Tolerated well.

## 2015-10-09 NOTE — Op Note (Signed)
Patient:  Kaylee Mitchell  DOB:  1955/02/05  MRN:  [id number]   Preop Diagnosis:  Right breast carcinoma  Postop Diagnosis:  Same  Procedure:  Port-A-Cath insertion  Surgeon:  Aviva Signs, M.D.  Anes:  Mac  Indications:  Patient is a 60 year old black female with right breast carcinoma who is about to undergo chemotherapy. The risks and benefits of the Port-A-Cath insertion including bleeding, infection, and the possibility of a pneumothorax were fully explained to the patient, who gave informed consent.  Procedure note:  The patient was placed in the Trendelenburg position after the left upper chest was prepped and draped using the usual sterile technique with DuraPrep. Surgical site confirmation was performed.  1% Xylocaine was used for local anesthesia.  An incision was made inferior to the left clavicle. A subcutaneous pocket was then formed. A needle is advanced into the left subclavian vein using the Seldinger technique without difficulty. A guidewire was then inserted into the right atrium under fluoroscopic guidance. An introducer peel-away sheath were placed over the guidewire. The catheter was then inserted through the peel-away sheath the peel-away sheath was removed. The catheter was then attached to the port and the port placed in subcutaneous pocket. Adequate positioning was confirmed by fluoroscopy. Good backflow blood was noted in the port. A power port was inserted. The port was flushed with heparin flush. The subcutaneous layer was reapproximated using a 3-0 Vicryl interrupted suture. The skin was closed using a 4-0 Vicryl subcuticular suture. Dermabond was then applied.  All tape and needle counts were correct at the end of the procedure. Patient was awakened and transferred to PACU in stable condition. A chest x-ray will be performed at that time.  Complications:  None  EBL:  Minimal  Specimen:  None

## 2015-10-09 NOTE — Discharge Instructions (Signed)
Implanted Port Insertion, Care After °Refer to this sheet in the next few weeks. These instructions provide you with information on caring for yourself after your procedure. Your health care provider may also give you more specific instructions. Your treatment has been planned according to current medical practices, but problems sometimes occur. Call your health care provider if you have any problems or questions after your procedure. °WHAT TO EXPECT AFTER THE PROCEDURE °After your procedure, it is typical to have the following:  °· Discomfort at the port insertion site. Ice packs to the area will help. °· Bruising on the skin over the port. This will subside in 3-4 days. °HOME CARE INSTRUCTIONS °· After your port is placed, you will get a manufacturer's information card. The card has information about your port. Keep this card with you at all times.   °· Know what kind of port you have. There are many types of ports available.   °· Wear a medical alert bracelet in case of an emergency. This can help alert health care workers that you have a port.   °· The port can stay in for as long as your health care provider believes it is necessary.   °· A home health care nurse may give medicines and take care of the port.   °· You or a family member can get special training and directions for giving medicine and taking care of the port at home.   °SEEK MEDICAL CARE IF:  °· Your port does not flush or you are unable to get a blood return.   °· You have a fever or chills. °SEEK IMMEDIATE MEDICAL CARE IF: °· You have new fluid or pus coming from your incision.   °· You notice a bad smell coming from your incision site.   °· You have swelling, pain, or more redness at the incision or port site.   °· You have chest pain or shortness of breath. °  °This information is not intended to replace advice given to you by your health care provider. Make sure you discuss any questions you have with your health care provider. °  °Document  Released: 10/24/2012 Document Revised: 01/08/2013 Document Reviewed: 10/24/2012 °Elsevier Interactive Patient Education ©2016 Elsevier Inc[employer] °Implanted Port Home Guide °An implanted port is a type of central line that is placed under the skin. Central lines are used to provide IV access when treatment or nutrition needs to be given through a person's veins. Implanted ports are used for long-term IV access. An implanted port may be placed because:  °· You need IV medicine that would be irritating to the small veins in your hands or arms.   °· You need long-term IV medicines, such as antibiotics.   °· You need IV nutrition for a long period.   °· You need frequent blood draws for lab tests.   °· You need dialysis.   °Implanted ports are usually placed in the chest area, but they can also be placed in the upper arm, the abdomen, or the leg. An implanted port has two main parts:  °· Reservoir. The reservoir is round and will appear as a small, raised area under your skin. The reservoir is the part where a needle is inserted to give medicines or draw blood.   °· Catheter. The catheter is a thin, flexible tube that extends from the reservoir. The catheter is placed into a large vein. Medicine that is inserted into the reservoir goes into the catheter and then into the vein.   °HOW WILL I CARE FOR MY INCISION SITE? °Do not get the   incision site wet. Bathe or shower as directed by your health care provider.  °HOW IS MY PORT ACCESSED? °Special steps must be taken to access the port:  °· Before the port is accessed, a numbing cream can be placed on the skin. This helps numb the skin over the port site.   °· Your health care provider uses a sterile technique to access the port. °· Your health care provider must put on a mask and sterile gloves. °· The skin over your port is cleaned carefully with an antiseptic and allowed to dry. °· The port is gently pinched between sterile gloves, and a needle is inserted into the  port. °· Only "non-coring" port needles should be used to access the port. Once the port is accessed, a blood return should be checked. This helps ensure that the port is in the vein and is not clogged.   °· If your port needs to remain accessed for a constant infusion, a clear (transparent) bandage will be placed over the needle site. The bandage and needle will need to be changed every week, or as directed by your health care provider.   °· Keep the bandage covering the needle clean and dry. Do not get it wet. Follow your health care provider's instructions on how to take a shower or bath while the port is accessed.   °· If your port does not need to stay accessed, no bandage is needed over the port.   °WHAT IS FLUSHING? °Flushing helps keep the port from getting clogged. Follow your health care provider's instructions on how and when to flush the port. Ports are usually flushed with saline solution or a medicine called heparin. The need for flushing will depend on how the port is used.  °· If the port is used for intermittent medicines or blood draws, the port will need to be flushed:   °· After medicines have been given.   °· After blood has been drawn.   °· As part of routine maintenance.   °· If a constant infusion is running, the port may not need to be flushed.   °HOW LONG WILL MY PORT STAY IMPLANTED? °The port can stay in for as long as your health care provider thinks it is needed. When it is time for the port to come out, surgery will be done to remove it. The procedure is similar to the one performed when the port was put in.  °WHEN SHOULD I SEEK IMMEDIATE MEDICAL CARE? °When you have an implanted port, you should seek immediate medical care if:  °· You notice a bad smell coming from the incision site.   °· You have swelling, redness, or drainage at the incision site.   °· You have more swelling or pain at the port site or the surrounding area.   °· You have a fever that is not controlled with  medicine. °  °This information is not intended to replace advice given to you by your health care provider. Make sure you discuss any questions you have with your health care provider. °  °Document Released: 01/03/2005 Document Revised: 10/24/2012 Document Reviewed: 09/10/2012 °Elsevier Interactive Patient Education ©2016 Elsevier Inc. ° °PATIENT INSTRUCTIONS °POST-ANESTHESIA ° °IMMEDIATELY FOLLOWING SURGERY:  Do not drive or operate machinery for the first twenty four hours after surgery.  Do not make any important decisions for twenty four hours after surgery or while taking narcotic pain medications or sedatives.  If you develop intractable nausea and vomiting or a severe headache please notify your doctor immediately. ° °FOLLOW-UP:  Please make an   appointment with your surgeon as instructed. You do not need to follow up with anesthesia unless specifically instructed to do so. ° °WOUND CARE INSTRUCTIONS (if applicable):  Keep a dry clean dressing on the anesthesia/puncture wound site if there is drainage.  Once the wound has quit draining you may leave it open to air.  Generally you should leave the bandage intact for twenty four hours unless there is drainage.  If the epidural site drains for more than 36-48 hours please call the anesthesia department. ° °QUESTIONS?:  Please feel free to call your physician or the hospital operator if you have any questions, and they will be happy to assist you.    ° ° ° °

## 2015-10-09 NOTE — Progress Notes (Signed)
CXR done

## 2015-10-09 NOTE — Interval H&P Note (Signed)
History and Physical Interval Note:  10/09/2015 8:04 AM  Kaylee Mitchell  has presented today for surgery, with the diagnosis of right breast cancer  The various methods of treatment have been discussed with the patient and family. After consideration of risks, benefits and other options for treatment, the patient has consented to  Procedure(s): INSERTION PORT-A-CATH (Left) as a surgical intervention .  The patient's history has been reviewed, patient examined, no change in status, stable for surgery.  I have reviewed the patient's chart and labs.  Questions were answered to the patient's satisfaction.     Aviva Signs A

## 2015-10-09 NOTE — Progress Notes (Signed)
Finger stick blood sugar results of 291 given to Dr Patsey Berthold per Garen Lah CRNA. New order given per Dr Patsey Berthold for novolog insulin.

## 2015-10-09 NOTE — Anesthesia Preprocedure Evaluation (Signed)
Anesthesia Evaluation  Patient identified by MRN, date of birth, ID band Patient awake    Reviewed: Allergy & Precautions, H&P , NPO status , Patient's Chart, lab work & pertinent test results, reviewed documented beta blocker date and time   Airway Mallampati: I  TM Distance: >3 FB Neck ROM: Full    Dental  (+) Poor Dentition, Missing   Pulmonary    Pulmonary exam normal        Cardiovascular hypertension, Pt. on medications and Pt. on home beta blockers Normal cardiovascular exam Rhythm:Regular Rate:Normal     Neuro/Psych PSYCHIATRIC DISORDERS Depression    GI/Hepatic negative GI ROS, Neg liver ROS, GERD  ,  Endo/Other  diabetes, Poorly Controlled, Type 2, Insulin DependentMorbid obesity  Renal/GU Renal diseasenegative Renal ROS     Musculoskeletal   Abdominal (+) + obese,  Abdomen: soft.    Peds  Hematology   Anesthesia Other Findings   Reproductive/Obstetrics                             Anesthesia Physical Anesthesia Plan  ASA: III  Anesthesia Plan: MAC   Post-op Pain Management:    Induction: Intravenous  Airway Management Planned: Simple Face Mask  Additional Equipment:   Intra-op Plan:   Post-operative Plan:   Informed Consent: I have reviewed the patients History and Physical, chart, labs and discussed the procedure including the risks, benefits and alternatives for the proposed anesthesia with the patient or authorized representative who has indicated his/her understanding and acceptance.     Plan Discussed with: CRNA  Anesthesia Plan Comments:         Anesthesia Quick Evaluation

## 2015-10-13 ENCOUNTER — Encounter (HOSPITAL_BASED_OUTPATIENT_CLINIC_OR_DEPARTMENT_OTHER): Payer: Medicare Other

## 2015-10-13 ENCOUNTER — Encounter (HOSPITAL_COMMUNITY): Payer: Self-pay

## 2015-10-13 VITALS — BP 156/81 | HR 93 | Temp 98.3°F | Resp 18 | Wt 246.4 lb

## 2015-10-13 DIAGNOSIS — C50919 Malignant neoplasm of unspecified site of unspecified female breast: Secondary | ICD-10-CM | POA: Diagnosis not present

## 2015-10-13 DIAGNOSIS — C7951 Secondary malignant neoplasm of bone: Secondary | ICD-10-CM | POA: Diagnosis not present

## 2015-10-13 DIAGNOSIS — Z5111 Encounter for antineoplastic chemotherapy: Secondary | ICD-10-CM

## 2015-10-13 DIAGNOSIS — C787 Secondary malignant neoplasm of liver and intrahepatic bile duct: Secondary | ICD-10-CM

## 2015-10-13 DIAGNOSIS — E119 Type 2 diabetes mellitus without complications: Secondary | ICD-10-CM | POA: Diagnosis not present

## 2015-10-13 LAB — CBC WITH DIFFERENTIAL/PLATELET
BASOS ABS: 0 10*3/uL (ref 0.0–0.1)
BASOS PCT: 0 %
EOS PCT: 1 %
Eosinophils Absolute: 0.1 10*3/uL (ref 0.0–0.7)
HCT: 38 % (ref 36.0–46.0)
Hemoglobin: 12.1 g/dL (ref 12.0–15.0)
Lymphocytes Relative: 25 %
Lymphs Abs: 1.9 10*3/uL (ref 0.7–4.0)
MCH: 26.8 pg (ref 26.0–34.0)
MCHC: 31.8 g/dL (ref 30.0–36.0)
MCV: 84.1 fL (ref 78.0–100.0)
MONO ABS: 0.5 10*3/uL (ref 0.1–1.0)
MONOS PCT: 7 %
Neutro Abs: 5.1 10*3/uL (ref 1.7–7.7)
Neutrophils Relative %: 67 %
PLATELETS: 176 10*3/uL (ref 150–400)
RBC: 4.52 MIL/uL (ref 3.87–5.11)
RDW: 14.4 % (ref 11.5–15.5)
WBC: 7.5 10*3/uL (ref 4.0–10.5)

## 2015-10-13 LAB — COMPREHENSIVE METABOLIC PANEL
ALBUMIN: 3.2 g/dL — AB (ref 3.5–5.0)
ALT: 151 U/L — ABNORMAL HIGH (ref 14–54)
ANION GAP: 8 (ref 5–15)
AST: 148 U/L — AB (ref 15–41)
Alkaline Phosphatase: 325 U/L — ABNORMAL HIGH (ref 38–126)
BILIRUBIN TOTAL: 0.9 mg/dL (ref 0.3–1.2)
BUN: 23 mg/dL — AB (ref 6–20)
CALCIUM: 9.1 mg/dL (ref 8.9–10.3)
CO2: 27 mmol/L (ref 22–32)
Chloride: 100 mmol/L — ABNORMAL LOW (ref 101–111)
Creatinine, Ser: 1.35 mg/dL — ABNORMAL HIGH (ref 0.44–1.00)
GFR calc Af Amer: 48 mL/min — ABNORMAL LOW (ref >60)
GFR, EST NON AFRICAN AMERICAN: 41 mL/min — AB (ref >60)
GLUCOSE: 265 mg/dL — AB (ref 65–99)
POTASSIUM: 4.1 mmol/L (ref 3.5–5.1)
Sodium: 135 mmol/L (ref 135–145)
TOTAL PROTEIN: 7 g/dL (ref 6.5–8.1)

## 2015-10-13 MED ORDER — SODIUM CHLORIDE 0.9% FLUSH: 10.0000 mL | INTRAVENOUS | Status: DC | PRN

## 2015-10-13 MED ORDER — FAMOTIDINE IN NACL 20-0.9 MG/50ML-% IV SOLN
20.0000 mg | Freq: Once | INTRAVENOUS | Status: AC
  Administered 2015-10-13: 20 mg via INTRAVENOUS
  Filled 2015-10-13: qty 50

## 2015-10-13 MED ORDER — PACLITAXEL CHEMO INJECTION 300 MG/50ML
60.0000 mg/m2 | Freq: Once | INTRAVENOUS | Status: AC
  Administered 2015-10-13: 138 mg via INTRAVENOUS
  Filled 2015-10-13: qty 23

## 2015-10-13 MED ORDER — DIPHENHYDRAMINE HCL 50 MG/ML IJ SOLN
50.0000 mg | Freq: Once | INTRAMUSCULAR | Status: AC
  Administered 2015-10-13: 50 mg via INTRAVENOUS
  Filled 2015-10-13: qty 1

## 2015-10-13 MED ORDER — HEPARIN SOD (PORK) LOCK FLUSH 100 UNIT/ML IV SOLN
500.0000 [IU] | Freq: Once | INTRAVENOUS | Status: AC | PRN
  Administered 2015-10-13: 500 [IU]
  Filled 2015-10-13: qty 5

## 2015-10-13 MED ORDER — DEXAMETHASONE SODIUM PHOSPHATE 100 MG/10ML IJ SOLN
10.0000 mg | Freq: Once | INTRAMUSCULAR | Status: AC
  Administered 2015-10-13: 10 mg via INTRAVENOUS
  Filled 2015-10-13: qty 1

## 2015-10-13 MED ORDER — SODIUM CHLORIDE 0.9 % IV SOLN
Freq: Once | INTRAVENOUS | Status: AC
  Administered 2015-10-13: 10:00:00 via INTRAVENOUS

## 2015-10-13 MED ORDER — INSULIN ASPART 100 UNIT/ML ~~LOC~~ SOLN
6.0000 [IU] | Freq: Once | SUBCUTANEOUS | Status: AC
  Administered 2015-10-13: 6 [IU] via SUBCUTANEOUS
  Filled 2015-10-13: qty 0.06

## 2015-10-13 NOTE — Patient Instructions (Addendum)
[facility] Discharge Instructions for Patients Receiving Chemotherapy   Beginning January 23rd 2017 lab work for the [facility] will be done in the  Main lab at [facility] on 1st floor. If you have a lab appointment with the [facility] please come in thru the  Main Entrance and check in at the main information desk   [place].  Today you received the following chemotherapy agents:  Taxol  To help prevent nausea and vomiting, take your medications as prescribed at the first sign of nausea.  If you develop nausea and vomiting, or diarrhea that is not controlled by your medication, call the clinic.  The clinic phone number is [phone]. Office hours are Monday-Friday 8:30am-5:00pm.  BELOW ARE SYMPTOMS THAT SHOULD BE REPORTED IMMEDIATELY:  *FEVER GREATER THAN 101.0 F  *CHILLS WITH OR WITHOUT FEVER  NAUSEA AND VOMITING THAT IS NOT CONTROLLED WITH YOUR NAUSEA MEDICATION  *UNUSUAL SHORTNESS OF BREATH  *UNUSUAL BRUISING OR BLEEDING  TENDERNESS IN MOUTH AND THROAT WITH OR WITHOUT PRESENCE OF ULCERS  *URINARY PROBLEMS  *BOWEL PROBLEMS  UNUSUAL RASH Items with * indicate a potential emergency and should be followed up as soon as possible. If you have an emergency after office hours please contact your primary care physician or go to the nearest emergency department.  Please call the clinic during office hours if you have any questions or concerns.   You may also contact the Patient Navigator at [phone] should you have any questions or need assistance in obtaining follow up care.      Resources For Cancer Patients and their Caregivers ? American Cancer Society: Can assist with transportation, wigs, general needs, runs Look Good Feel Better.        [phone] ? Cancer Care: Provides financial assistance, online support groups, medication/co-pay assistance.  1-800-813-HOPE  484-053-2272) ? [facility] Assists [place] cancer patients and their families through emotional , educational and financial support.  (319)368-7001 ? [place] DSS Where to apply for food stamps, Medicaid and utility assistance. 831-002-6977 ? [facility]: Transportation to medical appointments. 216-495-0634 ? Social Security Administration: May apply for disability if have a Stage IV cancer. 8728722340 574-236-9450 ? [employer], Disability and Transit Services: Assists with nutrition, care and transit needs. [phone]

## 2015-10-13 NOTE — Progress Notes (Signed)
Dr. Whitney Muse aware of lab results.  Will dose reduce Taxol and tx today per MD. Orders rec'd for Novolog 6 units [place].   Pt reports she has not taken her BP meds or any of her daily medications today.  Instructed pt to be sure to take her home meds upon leaving the clinic - states she brought them with her and has taken them.  Tolerated infusion w/o adverse reaction.  VSS.  Alert, in no distress.  Discharged via wheelchair in c/o family.

## 2015-10-14 ENCOUNTER — Encounter (HOSPITAL_COMMUNITY): Payer: Self-pay | Admitting: General Surgery

## 2015-10-14 ENCOUNTER — Telehealth (HOSPITAL_COMMUNITY): Payer: Self-pay | Admitting: *Deleted

## 2015-10-14 LAB — CANCER ANTIGEN 15-3: CA 15-3: 61.9 U/mL — ABNORMAL HIGH (ref 0.0–25.0)

## 2015-10-14 LAB — CANCER ANTIGEN 27.29: CA 27.29: 98.5 U/mL — ABNORMAL HIGH (ref 0.0–38.6)

## 2015-10-14 NOTE — Telephone Encounter (Signed)
Attempted to contact pt for 24 hour f/u post first Taxol infusion on 10/13/15.  No answer at contact number provided and no answering machine or voicemail box to leave message.

## 2015-10-16 DIAGNOSIS — R1030 Lower abdominal pain, unspecified: Secondary | ICD-10-CM | POA: Diagnosis not present

## 2015-10-16 DIAGNOSIS — R109 Unspecified abdominal pain: Secondary | ICD-10-CM | POA: Diagnosis not present

## 2015-10-16 DIAGNOSIS — M791 Myalgia: Secondary | ICD-10-CM | POA: Diagnosis not present

## 2015-10-20 ENCOUNTER — Ambulatory Visit (HOSPITAL_COMMUNITY)
Admission: RE | Admit: 2015-10-20 | Discharge: 2015-10-20 | Disposition: A | Discharge status: Home / Self Care | Payer: Medicare Other | Source: Ambulatory Visit | Attending: Oncology | Admitting: Oncology

## 2015-10-20 ENCOUNTER — Encounter (HOSPITAL_COMMUNITY): Payer: Medicare Other | Attending: Hematology & Oncology

## 2015-10-20 ENCOUNTER — Encounter (HOSPITAL_BASED_OUTPATIENT_CLINIC_OR_DEPARTMENT_OTHER): Payer: Medicare Other | Admitting: Oncology

## 2015-10-20 ENCOUNTER — Encounter (HOSPITAL_COMMUNITY): Payer: Self-pay | Admitting: Oncology

## 2015-10-20 VITALS — BP 150/86 | HR 100 | Temp 98.8°F | Resp 18 | Wt 244.8 lb

## 2015-10-20 VITALS — BP 155/95 | HR 105 | Temp 97.8°F | Resp 18 | Ht 65.0 in

## 2015-10-20 DIAGNOSIS — M79604 Pain in right leg: Secondary | ICD-10-CM | POA: Insufficient documentation

## 2015-10-20 DIAGNOSIS — C787 Secondary malignant neoplasm of liver and intrahepatic bile duct: Secondary | ICD-10-CM

## 2015-10-20 DIAGNOSIS — M1711 Unilateral primary osteoarthritis, right knee: Secondary | ICD-10-CM | POA: Insufficient documentation

## 2015-10-20 DIAGNOSIS — Z5111 Encounter for antineoplastic chemotherapy: Secondary | ICD-10-CM | POA: Diagnosis present

## 2015-10-20 DIAGNOSIS — C50919 Malignant neoplasm of unspecified site of unspecified female breast: Secondary | ICD-10-CM | POA: Diagnosis not present

## 2015-10-20 DIAGNOSIS — M25461 Effusion, right knee: Secondary | ICD-10-CM | POA: Diagnosis not present

## 2015-10-20 DIAGNOSIS — C7951 Secondary malignant neoplasm of bone: Secondary | ICD-10-CM

## 2015-10-20 DIAGNOSIS — M11261 Other chondrocalcinosis, right knee: Secondary | ICD-10-CM | POA: Diagnosis not present

## 2015-10-20 DIAGNOSIS — M179 Osteoarthritis of knee, unspecified: Secondary | ICD-10-CM | POA: Diagnosis not present

## 2015-10-20 DIAGNOSIS — M79661 Pain in right lower leg: Secondary | ICD-10-CM | POA: Diagnosis not present

## 2015-10-20 LAB — COMPREHENSIVE METABOLIC PANEL
ALK PHOS: 406 U/L — AB (ref 38–126)
ALT: 147 U/L — ABNORMAL HIGH (ref 14–54)
ANION GAP: 6 (ref 5–15)
AST: 168 U/L — AB (ref 15–41)
Albumin: 3.3 g/dL — ABNORMAL LOW (ref 3.5–5.0)
BILIRUBIN TOTAL: 0.7 mg/dL (ref 0.3–1.2)
BUN: 20 mg/dL (ref 6–20)
CALCIUM: 8.6 mg/dL — AB (ref 8.9–10.3)
CO2: 28 mmol/L (ref 22–32)
Chloride: 103 mmol/L (ref 101–111)
Creatinine, Ser: 1.01 mg/dL — ABNORMAL HIGH (ref 0.44–1.00)
GFR calc Af Amer: >60 mL/min (ref >60)
GFR, EST NON AFRICAN AMERICAN: 59 mL/min — AB (ref >60)
Glucose, Bld: 163 mg/dL — ABNORMAL HIGH (ref 65–99)
POTASSIUM: 4.1 mmol/L (ref 3.5–5.1)
Sodium: 137 mmol/L (ref 135–145)
TOTAL PROTEIN: 7.1 g/dL (ref 6.5–8.1)

## 2015-10-20 LAB — CBC WITH DIFFERENTIAL/PLATELET
Basophils Absolute: 0 10*3/uL (ref 0.0–0.1)
Basophils Relative: 1 %
Eosinophils Absolute: 0.1 10*3/uL (ref 0.0–0.7)
Eosinophils Relative: 2 %
HEMATOCRIT: 38.7 % (ref 36.0–46.0)
HEMOGLOBIN: 12.4 g/dL (ref 12.0–15.0)
LYMPHS PCT: 37 %
Lymphs Abs: 2 10*3/uL (ref 0.7–4.0)
MCH: 26.7 pg (ref 26.0–34.0)
MCHC: 32 g/dL (ref 30.0–36.0)
MCV: 83.2 fL (ref 78.0–100.0)
MONO ABS: 0.2 10*3/uL (ref 0.1–1.0)
MONOS PCT: 3 %
NEUTROS ABS: 3.1 10*3/uL (ref 1.7–7.7)
Neutrophils Relative %: 57 %
Platelets: 212 10*3/uL (ref 150–400)
RBC: 4.65 MIL/uL (ref 3.87–5.11)
RDW: 14.3 % (ref 11.5–15.5)
WBC: 5.5 10*3/uL (ref 4.0–10.5)

## 2015-10-20 MED ORDER — DIPHENHYDRAMINE HCL 50 MG/ML IJ SOLN
50.0000 mg | Freq: Once | INTRAMUSCULAR | Status: AC
  Administered 2015-10-20: 50 mg via INTRAVENOUS
  Filled 2015-10-20: qty 1

## 2015-10-20 MED ORDER — OXYCODONE HCL 10 MG PO TABS: ORAL_TABLET | ORAL | 0 refills | Status: DC

## 2015-10-20 MED ORDER — DEXTROSE 5 % IV SOLN
60.0000 mg/m2 | Freq: Once | INTRAVENOUS | Status: AC
  Administered 2015-10-20: 138 mg via INTRAVENOUS
  Filled 2015-10-20: qty 23

## 2015-10-20 MED ORDER — SODIUM CHLORIDE 0.9% FLUSH
10.0000 mL | INTRAVENOUS | Status: DC | PRN
  Administered 2015-10-20: 10 mL
  Filled 2015-10-20: qty 10

## 2015-10-20 MED ORDER — SODIUM CHLORIDE 0.9 % IV SOLN
Freq: Once | INTRAVENOUS | Status: AC
  Administered 2015-10-20: 10:00:00 via INTRAVENOUS

## 2015-10-20 MED ORDER — HEPARIN SOD (PORK) LOCK FLUSH 100 UNIT/ML IV SOLN
500.0000 [IU] | Freq: Once | INTRAVENOUS | Status: AC | PRN
  Administered 2015-10-20: 500 [IU]
  Filled 2015-10-20: qty 5

## 2015-10-20 MED ORDER — PREDNISONE 20 MG PO TABS: 40.0000 mg | ORAL_TABLET | Freq: Every day | ORAL | 0 refills | Status: DC

## 2015-10-20 MED ORDER — FAMOTIDINE IN NACL 20-0.9 MG/50ML-% IV SOLN
20.0000 mg | Freq: Once | INTRAVENOUS | Status: AC
  Administered 2015-10-20: 20 mg via INTRAVENOUS
  Filled 2015-10-20: qty 50

## 2015-10-20 MED ORDER — SODIUM CHLORIDE 0.9 % IV SOLN
10.0000 mg | Freq: Once | INTRAVENOUS | Status: AC
  Administered 2015-10-20: 10 mg via INTRAVENOUS
  Filled 2015-10-20: qty 1

## 2015-10-20 NOTE — Patient Instructions (Signed)
[facility] Discharge Instructions for Patients Receiving Chemotherapy   Beginning January 23rd 2017 lab work for the Cancer Center will be done in the  Main lab at [facility] on 1st floor. If you have a lab appointment with the Cancer Center please come in thru the  Main Entrance and check in at the main information desk   Today you received the following chemotherapy agents Taxol. Follow-up as scheduled. Call clinic for any questions or concerns  To help prevent nausea and vomiting after your treatment, we encourage you to take your nausea medication   If you develop nausea and vomiting, or diarrhea that is not controlled by your medication, call the clinic.  The clinic phone number is [phone]. Office hours are Monday-Friday 8:30am-5:00pm.  BELOW ARE SYMPTOMS THAT SHOULD BE REPORTED IMMEDIATELY:  *FEVER GREATER THAN 101.0 F  *CHILLS WITH OR WITHOUT FEVER  NAUSEA AND VOMITING THAT IS NOT CONTROLLED WITH YOUR NAUSEA MEDICATION  *UNUSUAL SHORTNESS OF BREATH  *UNUSUAL BRUISING OR BLEEDING  TENDERNESS IN MOUTH AND THROAT WITH OR WITHOUT PRESENCE OF ULCERS  *URINARY PROBLEMS  *BOWEL PROBLEMS  UNUSUAL RASH Items with * indicate a potential emergency and should be followed up as soon as possible. If you have an emergency after office hours please contact your primary care physician or go to the nearest emergency department.  Please call the clinic during office hours if you have any questions or concerns.   You may also contact the Patient Navigator at [phone] should you have any questions or need assistance in obtaining follow up care.      Resources For Cancer Patients and their Caregivers ? American Cancer Society: Can assist with transportation, wigs, general needs, runs Look Good Feel Better.        1-888-227-6333 ? Cancer Care: Provides financial assistance, online support groups, medication/co-pay assistance.  1-800-813-HOPE  (4673) ? [facility] Assists [place] cancer patients and their families through emotional , educational and financial support.  336-427-4357 ? [place] DSS Where to apply for food stamps, Medicaid and utility assistance. 336-342-1394 ? [facility]: Transportation to medical appointments. 336-347-2287 ? Social Security Administration: May apply for disability if have a Stage IV cancer. 336-342-7796 1-800-772-1213 ? [place] [facility], Disability and Transit Services: Assists with nutrition, care and transit needs. [phone]         

## 2015-10-20 NOTE — Assessment & Plan Note (Addendum)
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  Now with significant disease progression in liver requiring a change in therapy to systemic Paclitaxel beginning on 10/13/2015.  Oncology history is updated.  Labs today: CBC diff, CMET.  I personally reviewed and went over laboratory results with the patient.  The results are noted within this dictation.    Right leg pain.  She localizes it to medial right knee and mid-tibula.  She notes that it is worse at night.  Not bothering her now sitting.  Interfering with ambulation.  Pain medication not helping.  No edema, erythema, pain with palpation.  DG right knee and DG right tib/fib today.  Based upon results, may need to consider corticosteroid management for possible inflammatory response to bone mets.  Xrays are completed and unimpressive.  Rx for Prednisone   Pain contract in place with [facility].  Oxycodone was refilled today.  DO NOT RESUSCITATE.  DNR form filled out and provided to the patient on 10/24/2014.  Xgeva completed on 09/25/2015.  Delton See is due today.  Oncology Flowsheet 09/25/2015  denosumab (XGEVA) [place] 120 mg    Labs day 1 of each cycle: CBC diff, CMET, CA 27.29, CA15-3.  Labs with each treatment: CBC diff, CMET.   Return in 3 weeks for follow-up.

## 2015-10-20 NOTE — Progress Notes (Signed)
[id number] Labs reviewed with Kirby Crigler PA and chemo approved for today                                                               [place] tolerated chemo tx well without complaints or incident. Pt refused Influenza vaccine today.VSS upon discharge. Pt discharged via wheelchair in satisfactory condition with family

## 2015-10-20 NOTE — Progress Notes (Signed)
Kaylee Bellow, MD [place] [address]  Invasive ductal carcinoma of breast, unspecified laterality ([place]) - Plan: Oxycodone HCl 10 MG TABS, DG Knee 1-2 Views Right, DG Tibia/Fibula Right  Bone metastasis ([place]) - Plan: Oxycodone HCl 10 MG TABS, DG Knee 1-2 Views Right, DG Tibia/Fibula Right  Infiltrating ductal carcinoma of breast, unspecified laterality ([facility])  Right leg pain - Plan: DG Knee 1-2 Views Right, DG Tibia/Fibula Right  CURRENT THERAPY: Paclitaxel days 1, 8, 15 every 28 days beginning on 10/13/2015.  INTERVAL HISTORY: Kaylee Mitchell 60 y.o. female returns for followup of Stage IV breast cancer, ER/PR+, HER2 neg, with new hepatic metastasis (biopsy proven), NOW WITH PROGRESSION OF DISEASE.     Invasive ductal carcinoma of breast ([place])   05/26/2009 Initial Diagnosis    Needle core biopsy showed invasive mammary carcinoma with 1 + lymph node      06/03/2009 Surgery    Right modified radical mastectomy with 8/8 + lymph nodes by Dr. Ronnell Freshwater      07/22/2009 Cancer Staging    Bone scan + for multiple osseous lesions.  No disease elsewhere.      07/24/2009 - 03/23/2010 Chemotherapy    Arimidex      03/23/2010 Progression         03/23/2010 - 12/05/2012 Chemotherapy    Tamoxifen      11/28/2012 Progression    Worsening of lumbar spine osseous disease      12/06/2012 - 09/25/2014 Chemotherapy    Fulvestrant + Exemestane      01/24/2014 PET scan    No evidence of hypermetabolic recurrent or metastatic disease. Widespread osseous metastasis, without significant marrow hypermetabolism.      09/18/2014 Progression    PET- New hepatic metastasis within the LEFT hepatic lobe involving segments IVA, IVB, and III.      10/20/2014 - 11/13/2014 Chemotherapy    Afinitor + Exemestane      11/13/2014 Adverse Reaction    Increasing fatigue      11/13/2014 Treatment Plan Change    Hold Afinitor, will reduce dose to 7.5 mg daily.  Continue  Exemestane and Xgeva.      11/24/2014 - 01/16/2015 Chemotherapy    Afinitor 7.5 mg daily, Exemestane daily, and continuation of Xgeva.      01/16/2015 Adverse Reaction    Cough, ?Afinitor-induced      01/16/2015 - 02/02/2015 Chemotherapy    HOLD AFFINITOR secondary to cough, weight loss, Continue EXEMESTANE and Xgeva      01/30/2015 PET scan    Significant partial metabolic treatment response in liver metastases, nonenlarged R level 2 cervical LN, non specific, stable sclerotic osseous metastases throughout skeleton      02/02/2015 Adverse Reaction    Weight loss, decreased appetite      02/02/2015 Treatment Plan Change    Afinitor dose further reduced to 5 mg.      02/02/2015 -  Chemotherapy    Afinitor 5 mg and Exemestane with continuation of Xgeva      06/03/2015 Imaging    CT CAP- Hepatic metastatic disease appears to have improved slightly in the interval from 01/30/2015. Diffuse osseous metastatic disease.      08/21/2015 Imaging    CT CAP- Stable CT of the chest. No evidence for metastatic disease in the thorax. Overall, no substantial change in pre-existing liver lesions. There may be a new 10 mm lesion in the posterior right dome of liver, not  seen previously.       09/30/2015 Imaging    MRI liver- 1. Innumerable (> than 30) confluent liver metastases throughout the liver, significantly better visualized at MRI compared to prior CT and PET-CT studies, which limits comparison. By best estimate, there has been progression of the liver metastases compared to the 06/03/2015 CT study. 2. Patchy lesions throughout the thoracolumbar spine, correlating with the known extensive sclerotic osseous metastatic disease as seen on multiple prior CT studies, not appreciably changed.      09/30/2015 Progression    MRI of liver demonstrates significant progression of disease and climbing CA 27.29 and CA 15-3.      10/09/2015 Procedure    Port placement by Dr. Arnoldo Morale       10/13/2015 -  Chemotherapy    The patient had PACLitaxel (TAXOL) 138 mg in dextrose 5 % 250 mL chemo infusion (Dose modification: 60 mg/m2 (original dose 80 mg/m2, Cycle 1, Reason: Change in LFTs), 60 mg/m2 (original dose 80 mg/m2, Cycle 1, Reason: Change in LFTs)  for chemotherapy treatment.         Kaylee Mitchell reports that her first treatment went well.  She denies any nausea or vomiting.  Weight has come down further.  She notes that her appetite is stable.  She notes that her glucose remains in the mid-200's or higher at home.  I suspect poor compliance with diabetic diet.  She notes right leg pain.  She reports it is located on the medial aspect of knee and anterior aspect of mid-shaft of tibula. She notes that it is worse at night.  She reports that pain is not present now, while sitting.  She notes that it is a "nerve pain" and feels like a "shock."  She notes that it keeps her up at night.  She denies any trauma or falls.  She notes that the pain is a 10/10 on the pain scale.  She reports that her pain medication is not effective for this pain.   Review of Systems  Constitutional: Positive for weight loss. Negative for chills and fever.  HENT: Negative.   Eyes: Negative.   Respiratory: Negative.  Negative for cough.   Cardiovascular: Negative.  Negative for chest pain.  Gastrointestinal: Negative.  Negative for constipation, diarrhea, nausea and vomiting.  Genitourinary: Negative.  Negative for dysuria.  Musculoskeletal: Positive for joint pain. Negative for falls.  Skin: Negative.   Neurological: Negative.  Negative for weakness.  Endo/Heme/Allergies: Negative.   Psychiatric/Behavioral: Negative.     Past Medical History:  Diagnosis Date  . Arthritis   . Bone metastasis ([place]) 09/13/2010  . Cancer ([place])    rt breast is primary  . Depression   . Depression 12/14/2011  . Diabetes mellitus    type II  . DNR (do not resuscitate) 11/05/2014  . GERD (gastroesophageal reflux  disease)   . High cholesterol   . Hypertension   . Invasive ductal carcinoma of breast ([place]) 09/13/2010  . Knee pain, bilateral 2013  . Opioid contract exists with [facility] 10/24/2014   Contract found in Chart review in letter tab.    Past Surgical History:  Procedure Laterality Date  . ABDOMINAL HYSTERECTOMY    . KNEE SURGERY Bilateral   . MASTECTOMY     right side  . PORTACATH PLACEMENT  10/09/2015   Procedure: INSERTION PORT-A-CATH LEFT SUBCLAVIAN;  Surgeon: Aviva Signs, MD;  Location: [facility];  Service: General;;    Family History  Problem Relation Age  of Onset  . Diabetes Mother   . Diabetes Father   . Arthritis    . Cancer    . Diabetes    . Cancer Maternal Aunt   . Anesthesia problems Neg Hx   . Hypotension Neg Hx   . Malignant hyperthermia Neg Hx   . Pseudochol deficiency Neg Hx     Social History   Social History  . Marital status: Single    Spouse name: N/A  . Number of children: N/A  . Years of education: 59   Occupational History  . disabled Unemployed   Social History Main Topics  . Smoking status: Never Smoker  . Smokeless tobacco: Never Used  . Alcohol use No  . Drug use: No  . Sexual activity: Yes    Birth control/ protection: Surgical   Other Topics Concern  . None   Social History Narrative  . None     PHYSICAL EXAMINATION  ECOG PERFORMANCE STATUS: 1 - Symptomatic but completely ambulatory  Vitals:   10/20/15 0833  BP: (!) 155/95  Pulse: (!) 105  Resp: 18  Temp: 97.8 F (36.6 C)    GENERAL:alert, well nourished, well developed, comfortable, cooperative, obese, smiling and accompanied by her sister, in chemo-recliner SKIN: skin color, texture, turgor are normal, no rashes or significant lesions HEAD: Normocephalic, No masses, lesions, tenderness or abnormalities EYES: normal, EOMI, Conjunctiva are pink and non-injected EARS: External ears normal OROPHARYNX:lips, buccal mucosa, and tongue normal and mucous  membranes are moist  NECK: supple, trachea midline LYMPH:  not examined BREAST:not examined LUNGS: clear to auscultation  HEART: regular rate & rhythm, no murmurs, no gallops, tachycardia, S1 normal and S2 normal ABDOMEN:abdomen soft, non-tender, obese, normal bowel sounds and no masses or organomegaly BACK: Back symmetric, no curvature. EXTREMITIES:less then 2 second capillary refill, no joint deformities, effusion, or inflammation, no edema, no skin discoloration, negative findings: ROM of all joints is normal and no erythema, heat, or tenderness to palpation.  Negative Homan's sign.  NEURO: alert & oriented x 3 with fluent speech, no focal motor/sensory deficits   LABORATORY DATA: CBC    Component Value Date/Time   WBC 5.5 10/20/2015 0836   RBC 4.65 10/20/2015 0836   HGB 12.4 10/20/2015 0836   HCT 38.7 10/20/2015 0836   PLT 212 10/20/2015 0836   MCV 83.2 10/20/2015 0836   MCH 26.7 10/20/2015 0836   MCHC 32.0 10/20/2015 0836   RDW 14.3 10/20/2015 0836   LYMPHSABS 2.0 10/20/2015 0836   MONOABS 0.2 10/20/2015 0836   EOSABS 0.1 10/20/2015 0836   BASOSABS 0.0 10/20/2015 0836      Chemistry      Component Value Date/Time   NA 135 10/13/2015 0939   K 4.1 10/13/2015 0939   CL 100 (L) 10/13/2015 0939   CO2 27 10/13/2015 0939   BUN 23 (H) 10/13/2015 0939   CREATININE 1.35 (H) 10/13/2015 0939      Component Value Date/Time   CALCIUM 9.1 10/13/2015 0939   ALKPHOS 325 (H) 10/13/2015 0939   AST 148 (H) 10/13/2015 0939   ALT 151 (H) 10/13/2015 0939   BILITOT 0.9 10/13/2015 0939        PENDING LABS:   RADIOGRAPHIC STUDIES:  Mr Abdomen W Wo Contrast  Result Date: 09/30/2015 CLINICAL DATA:  Stage IV right breast cancer status post right mastectomy, presents for further evaluation of possible new liver lesion on recent CT study. EXAM: MRI ABDOMEN WITHOUT AND WITH CONTRAST TECHNIQUE: Multiplanar multisequence MR imaging  of the abdomen was performed both before and after the  administration of intravenous contrast. CONTRAST:  32m MULTIHANCE GADOBENATE DIMEGLUMINE 529 MG/ML IV SOLN COMPARISON:  08/21/2015 CT chest, abdomen and pelvis. FINDINGS: Significantly motion degraded study. Lower chest: Clear lung bases. Hepatobiliary: The liver is mildly enlarged by numerous (greater than 30) confluent T2 hyperintense liver masses scattered throughout the liver of various sizes, which demonstrate heterogeneous hypoenhancement consistent with metastatic disease, and are significantly better visualized at MRI compared to the prior CT studies. Representative liver masses as follows: - segment 8 right liver lobe 2.3 x 1.7 cm mass (series 6/ image 10), which correlates with the newly visualized lesion in this location described on the 08/21/2015 CT report - segment 4A left liver lobe 5.1 x 3.6 cm mass (series 6/ image 15), not well visualized on recent CT studies, although subjectively increased since 06/03/2015 CT, where this lesion measured approximately 3.7 x 2.4 cm - segment 3 left liver lobe 2.2 x 2.2 cm mass (series 6/image 22), not well visualized on recent CT studies, although subjectively increased since 06/03/2015 CT, where this lesion measured approximately 1.2 x 1.2 cm No convincing hepatic steatosis. Normal gallbladder with no cholelithiasis. No biliary ductal dilatation. Common bile duct diameter 4 mm. No choledocholithiasis. Pancreas: No pancreatic mass or duct dilation.  No pancreas divisum. Spleen: Normal size. No mass. Adrenals/Urinary Tract: Normal adrenals. No hydronephrosis. Normal kidneys with no renal mass. Stomach/Bowel: Small hiatal hernia. Otherwise grossly normal stomach. Visualized small and large bowel is normal caliber, with no bowel wall thickening. Vascular/Lymphatic: Atherosclerotic nonaneurysmal abdominal aorta. Patent portal, splenic, hepatic and renal veins. No pathologically enlarged lymph nodes in the abdomen. Other: No abdominal ascites or focal fluid collection.  Partially visualized right mastectomy. Musculoskeletal: Patchy osseous lesions throughout the visualized thoracolumbar spine correlate with the extensive patchy sclerotic lesions seen throughout the axial skeleton on the recent CT study, not appreciably changed. IMPRESSION: 1. Innumerable (> than 30) confluent liver metastases throughout the liver, significantly better visualized at MRI compared to prior CT and PET-CT studies, which limits comparison. By best estimate, there has been progression of the liver metastases compared to the 06/03/2015 CT study. 2. Patchy lesions throughout the thoracolumbar spine, correlating with the known extensive sclerotic osseous metastatic disease as seen on multiple prior CT studies, not appreciably changed. 3. Additional findings include aortic atherosclerosis and small hiatal hernia. Electronically Signed   By: JIlona SorrelM.D.   On: 09/30/2015 13:28   Dg Chest Port 1 View  Result Date: 10/09/2015 CLINICAL DATA:  Power port. EXAM: PORTABLE CHEST 1 VIEW COMPARISON:  CT 08/21/2015.  Chest x-ray 01/22/2015. FINDINGS: Power port noted with tip projected superior vena cava. Cardiomegaly. No pulmonary venous congestion. No focal pulmonary infiltrate. No pleural effusion or pneumothorax. Surgical clips over right axilla. IMPRESSION: 1. Power port with tip projected over superior vena cava. 2. Cardiomegaly.  No pulmonary venous congestion Electronically Signed   By: TMarcello Moores Register   On: 10/09/2015 09:33   Dg C-arm 1-60 Min-no Report  Result Date: 10/09/2015 CLINICAL DATA: port a cath insertion C-ARM 1-60 MINUTES Fluoroscopy was utilized by the requesting physician.  No radiographic interpretation.   Dg Hip Unilat With Pelvis 2-3 Views Right  Result Date: 09/23/2015 CLINICAL DATA:  No known injury.  Breast cancer.  Pain. EXAM: DG HIP (WITH OR WITHOUT PELVIS) 2-3V RIGHT COMPARISON:  CT 06/03/2015.  PET-CT 01/30/2015. FINDINGS: Multiple densities are noted throughout pelvis  and proximal femurs consistent with blastic metastatic disease. No  acute bony abnormality identified. Peripheral vascular calcification. IMPRESSION: 1. Multiple densities are noted throughout the pelvis and both femurs. Findings consistent with blastic metastatic disease. Similar findings noted on prior exam. No acute bony abnormalities identified. 2. Peripheral vascular disease. Electronically Signed   By: Marcello Moores  Register   On: 09/23/2015 12:33   Dg Femur 1v Right  Result Date: 09/23/2015 CLINICAL DATA:  Pain.  Breast cancer.  No known injury. EXAM: RIGHT FEMUR 1 VIEW COMPARISON:  PET-CT 01/30/2015 . FINDINGS: Sclerotic densities noted in the proximal right femur consistent with metastatic disease. Similar findings on prior study.No acute bony abnormality . Peripheral vascular calcification IMPRESSION: 1. Sclerotic densities in the proximal right femur consistent with known metastatic disease. Similar findings noted on prior exam. No acute bony abnormality. 2.  Peripheral vascular disease Electronically Signed   By: Marcello Moores  Register   On: 09/23/2015 12:34     PATHOLOGY:    ASSESSMENT AND PLAN:  Invasive ductal carcinoma of breast Stage IV Breast cancer with bone involvement requiring Xgeva.  Now with significant disease progression in liver requiring a change in therapy to systemic Paclitaxel beginning on 10/13/2015.  Oncology history is updated.  Labs today: CBC diff, CMET.  I personally reviewed and went over laboratory results with the patient.  The results are noted within this dictation.    Right leg pain.  She localizes it to medial right knee and mid-tibula.  She notes that it is worse at night.  Not bothering her now sitting.  Interfering with ambulation.  Pain medication not helping.  No edema, erythema, pain with palpation.  DG right knee and DG right tib/fib today.  Based upon results, may need to consider corticosteroid management for possible inflammatory response to bone  mets.  Pain contract in [facility] with [facility].  Oxycodone was refilled today.  DO NOT RESUSCITATE.  DNR form filled out and provided to the patient on 10/24/2014.  Xgeva completed on 09/25/2015.  Delton See is due today.  Oncology Flowsheet 09/25/2015  denosumab (XGEVA) Aline 120 mg    Labs day 1 of each cycle: CBC diff, CMET, CA 27.29, CA15-3.  Labs with each treatment: CBC diff, CMET.   Return in 3 weeks for follow-up.   ORDERS PLACED FOR THIS ENCOUNTER: Orders Placed This Encounter  Procedures  . DG Knee 1-2 Views Right  . DG Tibia/Fibula Right    MEDICATIONS PRESCRIBED THIS ENCOUNTER: Meds ordered this encounter  Medications  . Oxycodone HCl 10 MG TABS    Sig: Take 1 tablet every 4 hours as needed for pain.    Dispense:  100 tablet    Refill:  0    Order Specific Question:   Supervising Provider    Answer:   Patrici Ranks [id number]    THERAPY PLAN:  Paclitaxel x 4 cycles with plans on transitioning to maintenance Xeloda afterwards.  All questions were answered. The patient knows to call the clinic with any problems, questions or concerns. We can certainly see the patient much sooner if necessary.  Patient and plan discussed with Dr. Ancil Linsey and she is in agreement with the aforementioned.   This note is electronically signed by: Doy Mince 10/20/2015 8:55 AM

## 2015-10-20 NOTE — Patient Instructions (Signed)
[place] at [facility] Discharge Instructions  RECOMMENDATIONS MADE BY THE CONSULTANT AND ANY TEST RESULTS WILL BE SENT TO YOUR REFERRING PHYSICIAN.  You were seen today by Kirby Crigler PA-C. He has ordered an xray of your right leg. He has given you a refill on your Oxycodone.  Follow up in 3 weeks.   Thank you for choosing [place] at [facility] to provide your oncology and hematology care.  To afford each patient quality time with our provider, please arrive at least 15 minutes before your scheduled appointment time.   Beginning January 23rd 2017 lab work for the [facility] will be done in the  Main lab at [facility] on 1st floor. If you have a lab appointment with the [facility] please come in thru the  Main Entrance and check in at the main information desk  You need to re-schedule your appointment should you arrive 10 or more minutes late.  We strive to give you quality time with our providers, and arriving late affects you and other patients whose appointments are after yours.  Also, if you no show three or more times for appointments you may be dismissed from the clinic at the providers discretion.     Again, thank you for choosing [facility].  Our hope is that these requests will decrease the amount of time that you wait before being seen by our physicians.       _____________________________________________________________  Should you have questions after your visit to [facility], please contact our office at [phone] between the hours of 8:30 a.m. and 4:30 p.m.  Voicemails left after 4:30 p.m. will not be returned until the following business day.  For prescription refill requests, have your pharmacy contact our office.         Resources For Cancer Patients and their Caregivers ? American Cancer Society: Can assist with transportation, wigs, general needs, runs Look Good Feel Better.         (214)257-6628 ? Cancer Care: Provides financial assistance, online support groups, medication/co-pay assistance.  1-800-813-HOPE (825)326-4647) ? [facility] Assists [place] cancer patients and their families through emotional , educational and financial support.  505-586-6895 ? [place] DSS Where to apply for food stamps, Medicaid and utility assistance. 4341254557 ? [facility]: Transportation to medical appointments. (561)057-8500 ? Social Security Administration: May apply for disability if have a Stage IV cancer. 616-262-4713 508-540-1905 ? [employer], Disability and Transit Services: Assists with nutrition, care and transit needs. [place] Support Programs: @10RELATIVEDAYS @ > Cancer Support Group  2nd Tuesday of the month 1pm-2pm, Journey Room  > Creative Journey  3rd Tuesday of the month 1130am-1pm, Journey Room  > Look Good Feel Better  1st Wednesday of the month 10am-12 noon, Journey Room (Call [facility] to register [phone])

## 2015-10-22 ENCOUNTER — Telehealth (HOSPITAL_COMMUNITY): Payer: Self-pay | Admitting: *Deleted

## 2015-10-22 NOTE — Telephone Encounter (Signed)
-----   Message from Baird [facility], PA-C sent at 10/20/2015  5:00 PM EDT ----- Neg.  I will call in a Prednisone pulse to see if that helps with her pain.  She is to take it in the AM with food.

## 2015-10-23 ENCOUNTER — Other Ambulatory Visit (HOSPITAL_COMMUNITY): Payer: Medicare Other

## 2015-10-23 ENCOUNTER — Encounter (HOSPITAL_COMMUNITY): Payer: Medicare Other

## 2015-10-27 ENCOUNTER — Encounter (HOSPITAL_BASED_OUTPATIENT_CLINIC_OR_DEPARTMENT_OTHER): Payer: Medicare Other | Admitting: Oncology

## 2015-10-27 ENCOUNTER — Encounter (HOSPITAL_BASED_OUTPATIENT_CLINIC_OR_DEPARTMENT_OTHER): Payer: Medicare Other

## 2015-10-27 ENCOUNTER — Encounter (HOSPITAL_COMMUNITY): Payer: Medicare Other

## 2015-10-27 ENCOUNTER — Encounter: Payer: Self-pay | Admitting: Dietician

## 2015-10-27 VITALS — BP 114/69 | HR 110 | Temp 98.1°F | Resp 20 | Wt 241.5 lb

## 2015-10-27 VITALS — BP 139/71 | HR 101 | Temp 98.9°F | Resp 18

## 2015-10-27 DIAGNOSIS — R110 Nausea: Secondary | ICD-10-CM | POA: Diagnosis not present

## 2015-10-27 DIAGNOSIS — F329 Major depressive disorder, single episode, unspecified: Secondary | ICD-10-CM

## 2015-10-27 DIAGNOSIS — C787 Secondary malignant neoplasm of liver and intrahepatic bile duct: Secondary | ICD-10-CM | POA: Diagnosis not present

## 2015-10-27 DIAGNOSIS — C7951 Secondary malignant neoplasm of bone: Secondary | ICD-10-CM

## 2015-10-27 DIAGNOSIS — Z5111 Encounter for antineoplastic chemotherapy: Secondary | ICD-10-CM | POA: Diagnosis present

## 2015-10-27 DIAGNOSIS — C50919 Malignant neoplasm of unspecified site of unspecified female breast: Secondary | ICD-10-CM

## 2015-10-27 DIAGNOSIS — F32A Depression, unspecified: Secondary | ICD-10-CM

## 2015-10-27 LAB — COMPREHENSIVE METABOLIC PANEL
ALBUMIN: 3.2 g/dL — AB (ref 3.5–5.0)
ALK PHOS: 366 U/L — AB (ref 38–126)
ALT: 142 U/L — AB (ref 14–54)
ANION GAP: 7 (ref 5–15)
AST: 137 U/L — ABNORMAL HIGH (ref 15–41)
BUN: 25 mg/dL — ABNORMAL HIGH (ref 6–20)
CALCIUM: 8.6 mg/dL — AB (ref 8.9–10.3)
CO2: 26 mmol/L (ref 22–32)
CREATININE: 1.45 mg/dL — AB (ref 0.44–1.00)
Chloride: 103 mmol/L (ref 101–111)
GFR calc Af Amer: 44 mL/min — ABNORMAL LOW (ref >60)
GFR calc non Af Amer: 38 mL/min — ABNORMAL LOW (ref >60)
GLUCOSE: 146 mg/dL — AB (ref 65–99)
Potassium: 4.2 mmol/L (ref 3.5–5.1)
SODIUM: 136 mmol/L (ref 135–145)
Total Bilirubin: 0.6 mg/dL (ref 0.3–1.2)
Total Protein: 6.7 g/dL (ref 6.5–8.1)

## 2015-10-27 LAB — CBC WITH DIFFERENTIAL/PLATELET
BASOS PCT: 1 %
Basophils Absolute: 0 10*3/uL (ref 0.0–0.1)
EOS ABS: 0.1 10*3/uL (ref 0.0–0.7)
Eosinophils Relative: 2 %
HCT: 37.6 % (ref 36.0–46.0)
HEMOGLOBIN: 12.3 g/dL (ref 12.0–15.0)
Lymphocytes Relative: 50 %
Lymphs Abs: 2.2 10*3/uL (ref 0.7–4.0)
MCH: 27.3 pg (ref 26.0–34.0)
MCHC: 32.7 g/dL (ref 30.0–36.0)
MCV: 83.4 fL (ref 78.0–100.0)
Monocytes Absolute: 0.3 10*3/uL (ref 0.1–1.0)
Monocytes Relative: 6 %
NEUTROS PCT: 41 %
Neutro Abs: 1.8 10*3/uL (ref 1.7–7.7)
Platelets: 242 10*3/uL (ref 150–400)
RBC: 4.51 MIL/uL (ref 3.87–5.11)
RDW: 14.7 % (ref 11.5–15.5)
WBC: 4.4 10*3/uL (ref 4.0–10.5)

## 2015-10-27 MED ORDER — ESCITALOPRAM OXALATE 10 MG PO TABS: 10.0000 mg | ORAL_TABLET | Freq: Every day | ORAL | 1 refills | Status: DC

## 2015-10-27 MED ORDER — FAMOTIDINE IN NACL 20-0.9 MG/50ML-% IV SOLN
20.0000 mg | Freq: Once | INTRAVENOUS | Status: AC
  Administered 2015-10-27: 20 mg via INTRAVENOUS
  Filled 2015-10-27: qty 50

## 2015-10-27 MED ORDER — HEPARIN SOD (PORK) LOCK FLUSH 100 UNIT/ML IV SOLN
500.0000 [IU] | Freq: Once | INTRAVENOUS | Status: AC | PRN
  Administered 2015-10-27: 500 [IU]
  Filled 2015-10-27: qty 5

## 2015-10-27 MED ORDER — HEPARIN SOD (PORK) LOCK FLUSH 100 UNIT/ML IV SOLN
INTRAVENOUS | Status: AC
  Filled 2015-10-27: qty 5

## 2015-10-27 MED ORDER — DENOSUMAB 120 MG/1.7ML ~~LOC~~ SOLN
120.0000 mg | Freq: Once | SUBCUTANEOUS | Status: AC
  Administered 2015-10-27: 120 mg via SUBCUTANEOUS
  Filled 2015-10-27: qty 1.7

## 2015-10-27 MED ORDER — SODIUM CHLORIDE 0.9% FLUSH: 10.0000 mL | INTRAVENOUS | Status: DC | PRN

## 2015-10-27 MED ORDER — PACLITAXEL CHEMO INJECTION 300 MG/50ML
60.0000 mg/m2 | Freq: Once | INTRAVENOUS | Status: AC
  Administered 2015-10-27: 138 mg via INTRAVENOUS
  Filled 2015-10-27: qty 23

## 2015-10-27 MED ORDER — SODIUM CHLORIDE 0.9 % IV SOLN
10.0000 mg | Freq: Once | INTRAVENOUS | Status: AC
  Administered 2015-10-27: 10 mg via INTRAVENOUS
  Filled 2015-10-27: qty 1

## 2015-10-27 MED ORDER — SODIUM CHLORIDE 0.9 % IV SOLN
Freq: Once | INTRAVENOUS | Status: AC
  Administered 2015-10-27: 09:00:00 via INTRAVENOUS

## 2015-10-27 MED ORDER — DRONABINOL 5 MG PO CAPS: 5.0000 mg | ORAL_CAPSULE | Freq: Two times a day (BID) | ORAL | 1 refills | Status: DC

## 2015-10-27 MED ORDER — DIPHENHYDRAMINE HCL 50 MG/ML IJ SOLN
50.0000 mg | Freq: Once | INTRAMUSCULAR | Status: AC
  Administered 2015-10-27: 50 mg via INTRAVENOUS
  Filled 2015-10-27: qty 1

## 2015-10-27 NOTE — Progress Notes (Signed)
See infusion encounter.  

## 2015-10-27 NOTE — Progress Notes (Signed)
Dr. Whitney Muse aware of LFT results - Taxol dose reduced and okay to tx today per MD.  Corrected calcium 9.24 - okay for Xgeva per Caroleen Hamman, PA.  Tolerated tx w/o adverse reaction.  Alert, in no distress.  VSS.  Discharged ambulatory in c/o family.

## 2015-10-27 NOTE — Progress Notes (Addendum)
Kaylee Bellow, MD [place] [address]  Infiltrating ductal carcinoma of breast, unspecified laterality ([place])  Nausea without vomiting - Plan: dronabinol (MARINOL) 5 MG capsule  Depression, unspecified depression type - Plan: escitalopram (LEXAPRO) 10 MG tablet  CURRENT THERAPY: Paclitaxel days 1, 8, 15 every 28 days beginning on 10/13/2015.  INTERVAL HISTORY: Kaylee Mitchell 60 y.o. female returns for followup of Stage IV breast cancer, ER/PR+, HER2 neg, with new hepatic metastasis (biopsy proven), NOW WITH PROGRESSION OF DISEASE.     Invasive ductal carcinoma of breast ([place])   05/26/2009 Initial Diagnosis    Needle core biopsy showed invasive mammary carcinoma with 1 + lymph node      06/03/2009 Surgery    Right modified radical mastectomy with 8/8 + lymph nodes by Dr. Ronnell Freshwater      07/22/2009 Cancer Staging    Bone scan + for multiple osseous lesions.  No disease elsewhere.      07/24/2009 - 03/23/2010 Chemotherapy    Arimidex      03/23/2010 Progression         03/23/2010 - 12/05/2012 Chemotherapy    Tamoxifen      11/28/2012 Progression    Worsening of lumbar spine osseous disease      12/06/2012 - 09/25/2014 Chemotherapy    Fulvestrant + Exemestane      01/24/2014 PET scan    No evidence of hypermetabolic recurrent or metastatic disease. Widespread osseous metastasis, without significant marrow hypermetabolism.      09/18/2014 Progression    PET- New hepatic metastasis within the LEFT hepatic lobe involving segments IVA, IVB, and III.      10/20/2014 - 11/13/2014 Chemotherapy    Afinitor + Exemestane      11/13/2014 Adverse Reaction    Increasing fatigue      11/13/2014 Treatment Plan Change    Hold Afinitor, will reduce dose to 7.5 mg daily.  Continue Exemestane and Xgeva.      11/24/2014 - 01/16/2015 Chemotherapy    Afinitor 7.5 mg daily, Exemestane daily, and continuation of Xgeva.      01/16/2015 Adverse Reaction   Cough, ?Afinitor-induced      01/16/2015 - 02/02/2015 Chemotherapy    HOLD AFFINITOR secondary to cough, weight loss, Continue EXEMESTANE and Xgeva      01/30/2015 PET scan    Significant partial metabolic treatment response in liver metastases, nonenlarged R level 2 cervical LN, non specific, stable sclerotic osseous metastases throughout skeleton      02/02/2015 Adverse Reaction    Weight loss, decreased appetite      02/02/2015 Treatment Plan Change    Afinitor dose further reduced to 5 mg.      02/02/2015 -  Chemotherapy    Afinitor 5 mg and Exemestane with continuation of Xgeva      06/03/2015 Imaging    CT CAP- Hepatic metastatic disease appears to have improved slightly in the interval from 01/30/2015. Diffuse osseous metastatic disease.      08/21/2015 Imaging    CT CAP- Stable CT of the chest. No evidence for metastatic disease in the thorax. Overall, no substantial change in pre-existing liver lesions. There may be a new 10 mm lesion in the posterior right dome of liver, not seen previously.       09/30/2015 Imaging    MRI liver- 1. Innumerable (> than 30) confluent liver metastases throughout the liver, significantly better visualized at MRI compared to prior CT and  PET-CT studies, which limits comparison. By best estimate, there has been progression of the liver metastases compared to the 06/03/2015 CT study. 2. Patchy lesions throughout the thoracolumbar spine, correlating with the known extensive sclerotic osseous metastatic disease as seen on multiple prior CT studies, not appreciably changed.      09/30/2015 Progression    MRI of liver demonstrates significant progression of disease and climbing CA 27.29 and CA 15-3.      10/09/2015 Procedure    Port placement by Dr. Arnoldo Morale      10/13/2015 -  Chemotherapy    The patient had PACLitaxel (TAXOL) 138 mg in dextrose 5 % 250 mL chemo infusion (Dose modification: 60 mg/m2 (original dose 80 mg/m2, Cycle 1, Reason:  Change in LFTs), 60 mg/m2 (original dose 80 mg/m2, Cycle 1, Reason: Change in LFTs)  for chemotherapy treatment.        She notes fatigue 3 days post chemotherapy, but she denies any nausea or vomiting associated with treatment.  Weight continues to decline.  She is seen today eating a banana.    She notes that she has been "experimenting" with her home medications.  She notes that some days, she does not take ANY of her medications and she feels great without any queasiness or nausea, but on days where she take her home medications, she notes nausea without vomiting.  She denies any new medications.  Review of Systems  Constitutional: Positive for weight loss. Negative for chills and fever.  HENT: Negative.   Eyes: Negative.   Respiratory: Negative.  Negative for cough.   Cardiovascular: Negative.  Negative for chest pain.  Gastrointestinal: Positive for nausea. Negative for constipation, diarrhea and vomiting.  Genitourinary: Negative.   Musculoskeletal: Negative.  Negative for joint pain.  Skin: Negative.   Neurological: Negative.   Endo/Heme/Allergies: Negative.   Psychiatric/Behavioral: Negative.     Past Medical History:  Diagnosis Date  . Arthritis   . Bone metastasis ([place]) 09/13/2010  . Cancer ([place])    rt breast is primary  . Depression   . Depression 12/14/2011  . Diabetes mellitus    type II  . DNR (do not resuscitate) 11/05/2014  . GERD (gastroesophageal reflux disease)   . High cholesterol   . Hypertension   . Invasive ductal carcinoma of breast ([place]) 09/13/2010  . Knee pain, bilateral 2013  . Opioid contract exists with [facility] 10/24/2014   Contract found in Chart review in letter tab.    Past Surgical History:  Procedure Laterality Date  . ABDOMINAL HYSTERECTOMY    . KNEE SURGERY Bilateral   . MASTECTOMY     right side  . PORTACATH PLACEMENT  10/09/2015   Procedure: INSERTION PORT-A-CATH LEFT SUBCLAVIAN;  Surgeon: Aviva Signs, MD;   Location: [facility];  Service: General;;    Family History  Problem Relation Age of Onset  . Diabetes Mother   . Diabetes Father   . Arthritis    . Cancer    . Diabetes    . Cancer Maternal Aunt   . Anesthesia problems Neg Hx   . Hypotension Neg Hx   . Malignant hyperthermia Neg Hx   . Pseudochol deficiency Neg Hx     Social History   Social History  . Marital status: Single    Spouse name: N/A  . Number of children: N/A  . Years of education: 2   Occupational History  . disabled Unemployed   Social History Main Topics  . Smoking status:  Never Smoker  . Smokeless tobacco: Never Used  . Alcohol use No  . Drug use: No  . Sexual activity: Yes    Birth control/ protection: Surgical   Other Topics Concern  . Not on file   Social History Narrative  . No narrative on file     PHYSICAL EXAMINATION  ECOG PERFORMANCE STATUS: 1 - Symptomatic but completely ambulatory  Vitals:   10/27/15 0834  BP: 114/69  Pulse: (!) 110  Resp: 20  Temp: 98.1 F (36.7 C)    GENERAL:alert, well nourished, well developed, comfortable, cooperative, obese, smiling and unaccompanied during exam. SKIN: skin color, texture, turgor are normal, no rashes or significant lesions HEAD: Normocephalic, No masses, lesions, tenderness or abnormalities EYES: normal, EOMI, Conjunctiva are pink and non-injected EARS: External ears normal OROPHARYNX:lips, buccal mucosa, and tongue normal and mucous membranes are moist  NECK: supple, trachea midline LYMPH:  not examined BREAST:not examined LUNGS: clear to auscultation without wheezes, rales, or rhonchi. HEART: regular rate & rhythm, no murmurs, no gallops, tachycardia, S1 normal and S2 normal ABDOMEN:abdomen soft, non-tender, obese, normal bowel sounds and no masses or organomegaly BACK: Back symmetric, no curvature. EXTREMITIES:less then 2 second capillary refill, no joint deformities, effusion, or inflammation, no edema, no skin  discoloration. NEURO: alert & oriented x 3 with fluent speech, no focal motor/sensory deficits   LABORATORY DATA: CBC    Component Value Date/Time   WBC 4.4 10/27/2015 0841   RBC 4.51 10/27/2015 0841   HGB 12.3 10/27/2015 0841   HCT 37.6 10/27/2015 0841   PLT 242 10/27/2015 0841   MCV 83.4 10/27/2015 0841   MCH 27.3 10/27/2015 0841   MCHC 32.7 10/27/2015 0841   RDW 14.7 10/27/2015 0841   LYMPHSABS 2.2 10/27/2015 0841   MONOABS 0.3 10/27/2015 0841   EOSABS 0.1 10/27/2015 0841   BASOSABS 0.0 10/27/2015 0841      Chemistry      Component Value Date/Time   NA 136 10/27/2015 0841   K 4.2 10/27/2015 0841   CL 103 10/27/2015 0841   CO2 26 10/27/2015 0841   BUN 25 (H) 10/27/2015 0841   CREATININE 1.45 (H) 10/27/2015 0841      Component Value Date/Time   CALCIUM 8.6 (L) 10/27/2015 0841   ALKPHOS 366 (H) 10/27/2015 0841   AST 137 (H) 10/27/2015 0841   ALT 142 (H) 10/27/2015 0841   BILITOT 0.6 10/27/2015 0841        PENDING LABS:   RADIOGRAPHIC STUDIES:  Dg Knee 1-2 Views Right  Result Date: 10/20/2015 CLINICAL DATA:  Hervey Ard shooting pain from knee down to left lower leg. Breast cancer. EXAM: RIGHT KNEE - 1-2 VIEW COMPARISON:  06/24/2012 FINDINGS: There is chondrocalcinosis. Moderate degenerative joint disease changes with joint space narrowing and spurring. This is most pronounced in the lateral and patellofemoral compartments. Small joint effusion. No acute bony abnormality. Specifically, no fracture, subluxation, or dislocation. Soft tissues are intact. IMPRESSION: Chondrocalcinosis with degenerative changes and small joint effusion. No acute findings. Electronically Signed   By: Rolm Baptise M.D.   On: 10/20/2015 14:04   Dg Tibia/fibula Right  Result Date: 10/20/2015 CLINICAL DATA:  Hervey Ard shooting pain from knee down to lower leg. EXAM: RIGHT TIBIA AND FIBULA - 2 VIEW COMPARISON:  06/24/2012 FINDINGS: Chondrocalcinosis and degenerative changes in the right knee. No  acute bony abnormality. Specifically, no fracture, subluxation, or dislocation. Soft tissues are intact. IMPRESSION: No acute bony abnormality. Electronically Signed   By: Rolm Baptise M.D.  On: 10/20/2015 15:31   Mr Abdomen W Wo Contrast  Result Date: 09/30/2015 CLINICAL DATA:  Stage IV right breast cancer status post right mastectomy, presents for further evaluation of possible new liver lesion on recent CT study. EXAM: MRI ABDOMEN WITHOUT AND WITH CONTRAST TECHNIQUE: Multiplanar multisequence MR imaging of the abdomen was performed both before and after the administration of intravenous contrast. CONTRAST:  48m MULTIHANCE GADOBENATE DIMEGLUMINE 529 MG/ML IV SOLN COMPARISON:  08/21/2015 CT chest, abdomen and pelvis. FINDINGS: Significantly motion degraded study. Lower chest: Clear lung bases. Hepatobiliary: The liver is mildly enlarged by numerous (greater than 30) confluent T2 hyperintense liver masses scattered throughout the liver of various sizes, which demonstrate heterogeneous hypoenhancement consistent with metastatic disease, and are significantly better visualized at MRI compared to the prior CT studies. Representative liver masses as follows: - segment 8 right liver lobe 2.3 x 1.7 cm mass (series 6/ image 10), which correlates with the newly visualized lesion in this location described on the 08/21/2015 CT report - segment 4A left liver lobe 5.1 x 3.6 cm mass (series 6/ image 15), not well visualized on recent CT studies, although subjectively increased since 06/03/2015 CT, where this lesion measured approximately 3.7 x 2.4 cm - segment 3 left liver lobe 2.2 x 2.2 cm mass (series 6/image 22), not well visualized on recent CT studies, although subjectively increased since 06/03/2015 CT, where this lesion measured approximately 1.2 x 1.2 cm No convincing hepatic steatosis. Normal gallbladder with no cholelithiasis. No biliary ductal dilatation. Common bile duct diameter 4 mm. No choledocholithiasis.  Pancreas: No pancreatic mass or duct dilation.  No pancreas divisum. Spleen: Normal size. No mass. Adrenals/Urinary Tract: Normal adrenals. No hydronephrosis. Normal kidneys with no renal mass. Stomach/Bowel: Small hiatal hernia. Otherwise grossly normal stomach. Visualized small and large bowel is normal caliber, with no bowel wall thickening. Vascular/Lymphatic: Atherosclerotic nonaneurysmal abdominal aorta. Patent portal, splenic, hepatic and renal veins. No pathologically enlarged lymph nodes in the abdomen. Other: No abdominal ascites or focal fluid collection. Partially visualized right mastectomy. Musculoskeletal: Patchy osseous lesions throughout the visualized thoracolumbar spine correlate with the extensive patchy sclerotic lesions seen throughout the axial skeleton on the recent CT study, not appreciably changed. IMPRESSION: 1. Innumerable (> than 30) confluent liver metastases throughout the liver, significantly better visualized at MRI compared to prior CT and PET-CT studies, which limits comparison. By best estimate, there has been progression of the liver metastases compared to the 06/03/2015 CT study. 2. Patchy lesions throughout the thoracolumbar spine, correlating with the known extensive sclerotic osseous metastatic disease as seen on multiple prior CT studies, not appreciably changed. 3. Additional findings include aortic atherosclerosis and small hiatal hernia. Electronically Signed   By: JIlona SorrelM.D.   On: 09/30/2015 13:28   Dg Chest Port 1 View  Result Date: 10/09/2015 CLINICAL DATA:  Power port. EXAM: PORTABLE CHEST 1 VIEW COMPARISON:  CT 08/21/2015.  Chest x-ray 01/22/2015. FINDINGS: Power port noted with tip projected superior vena cava. Cardiomegaly. No pulmonary venous congestion. No focal pulmonary infiltrate. No pleural effusion or pneumothorax. Surgical clips over right axilla. IMPRESSION: 1. Power port with tip projected over superior vena cava. 2. Cardiomegaly.  No pulmonary  venous congestion Electronically Signed   By: TMarcello Moores Register   On: 10/09/2015 09:33   Dg C-arm 1-60 Min-no Report  Result Date: 10/09/2015 CLINICAL DATA: port a cath insertion C-ARM 1-60 MINUTES Fluoroscopy was utilized by the requesting physician.  No radiographic interpretation.     PATHOLOGY:  ASSESSMENT AND PLAN:  Invasive ductal carcinoma of breast Stage IV Breast cancer with bone involvement requiring Xgeva.  Now with significant disease progression in liver requiring a change in therapy to systemic Paclitaxel beginning on 10/13/2015.  Oncology history is updated.  Labs today: CBC diff, CMET.  I personally reviewed and went over laboratory results with the patient.  The results are noted within this dictation.    Right leg pain resolved with steroid pulse prescribed ~ 2 weeks ago.  Imaging was performed and unimpressive.  She has been "experimenting" with her home medications.  She notes nausea following her medications at home, but 2 days ago, she did not take any of her home medications and "felt great" without any nausea.  She restarted her medications yesterday and noted nausea.  She denies any new medications.  I will ask her to follow-up with Dr. Karie Kirks regarding this issue.  I have asked her to take an anti-emetic 30-60 minutes prior to medication consumption to see if that helps.  She notes fatigue ~ 3 days after chemotherapy, but otherwise does well.  Ongoing weight loss is noted.  I will ask Burtis Junes, RD to follow-up with the patient.  Pain contract in place with [facility]  Oxycodone was refilled today.  DO NOT RESUSCITATE.  DNR form filled out and provided to the patient on 10/24/2014.  Delton See is due today.  Oncology Flowsheet 09/25/2015  denosumab (XGEVA) [place] 120 mg    Labs day 1 of each cycle: CBC diff, CMET, CA 27.29, CA15-3.  Labs with each treatment: CBC diff, CMET.   Return in 2 weeks for follow-up as scheduled.  Addendum: Burtis Junes saw the patient today.  She reported to him that she is nauseated and therefore this interferes with her appetite.  He suggested Marinol as Megace was not effective.  Additionally, I am concerned for depression as a contributing factor.  As a result, I have printed Rxs for Marinol and Lexapro.  I have called her pharmacy and they filled Effexor last in August (30 day supply).   ORDERS PLACED FOR THIS ENCOUNTER: No orders of the defined types were placed in this encounter.   MEDICATIONS PRESCRIBED THIS ENCOUNTER: Meds ordered this encounter  Medications  . dronabinol (MARINOL) 5 MG capsule    Sig: Take 1 capsule (5 mg total) by mouth 2 (two) times daily before a meal.    Dispense:  60 capsule    Refill:  1    Order Specific Question:   Supervising Provider    Answer:   Patrici Ranks [id number]  . escitalopram (LEXAPRO) 10 MG tablet    Sig: Take 1 tablet (10 mg total) by mouth daily.    Dispense:  30 tablet    Refill:  1    Order Specific Question:   Supervising Provider    Answer:   Patrici Ranks [id number]    THERAPY PLAN:  Paclitaxel x 4 cycles with plans on transitioning to maintenance Xeloda afterwards.  All questions were answered. The patient knows to call the clinic with any problems, questions or concerns. We can certainly see the patient much sooner if necessary.  Patient and plan discussed with Dr. Ancil Linsey and she is in agreement with the aforementioned.   This note is electronically signed by: Robynn Pane, PA-C 10/27/2015 10:45 AM

## 2015-10-27 NOTE — Patient Instructions (Signed)
[place] at [facility] Discharge Instructions  RECOMMENDATIONS MADE BY THE CONSULTANT AND ANY TEST RESULTS WILL BE SENT TO YOUR REFERRING PHYSICIAN.  You were seen today by Kirby Crigler PA-C. Take your nausea medication ( Zofran) 30 minutes to an hour before your other meds. Follow up with Dr. Karie Kirks regarding nausea post-medication consumption.   Thank you for choosing [place] at [facility] to provide your oncology and hematology care.  To afford each patient quality time with our provider, please arrive at least 15 minutes before your scheduled appointment time.   Beginning January 23rd 2017 lab work for the [facility] will be done in the  Main lab at [facility] on 1st floor. If you have a lab appointment with the [facility] please come in thru the  Main Entrance and check in at the main information desk  You need to re-schedule your appointment should you arrive 10 or more minutes late.  We strive to give you quality time with our providers, and arriving late affects you and other patients whose appointments are after yours.  Also, if you no show three or more times for appointments you may be dismissed from the clinic at the providers discretion.     Again, thank you for choosing [facility].  Our hope is that these requests will decrease the amount of time that you wait before being seen by our physicians.       _____________________________________________________________  Should you have questions after your visit to [facility], please contact our office at [phone] between the hours of 8:30 a.m. and 4:30 p.m.  Voicemails left after 4:30 p.m. will not be returned until the following business day.  For prescription refill requests, have your pharmacy contact our office.         Resources For Cancer Patients and their Caregivers ? American Cancer Society: Can assist with transportation,  wigs, general needs, runs Look Good Feel Better.        (513) 118-1766 ? Cancer Care: Provides financial assistance, online support groups, medication/co-pay assistance.  1-800-813-HOPE 434-858-2924) ? [facility] Assists [place] cancer patients and their families through emotional , educational and financial support.  725-081-8011 ? [place] DSS Where to apply for food stamps, Medicaid and utility assistance. 708-329-1296 ? [facility]: Transportation to medical appointments. 9722280581 ? Social Security Administration: May apply for disability if have a Stage IV cancer. 856-008-2275 223-222-3674 ? [employer], Disability and Transit Services: Assists with nutrition, care and transit needs. [place] Support Programs: @10RELATIVEDAYS @ > Cancer Support Group  2nd Tuesday of the month 1pm-2pm, Journey Room  > Creative Journey  3rd Tuesday of the month 1130am-1pm, Journey Room  > Look Good Feel Better  1st Wednesday of the month 10am-12 noon, Journey Room (Call [place] to register [phone])

## 2015-10-27 NOTE — Patient Instructions (Signed)
[facility] Discharge Instructions for Patients Receiving Chemotherapy   Beginning January 23rd 2017 lab work for the [facility] will be done in the  Main lab at [facility] on 1st floor. If you have a lab appointment with the [facility] please come in thru the  Main Entrance and check in at the main information desk   Today you received the following chemotherapy agents:  Taxol  You also received Xgeva injection today.  To help prevent nausea and vomiting after your treatment, we encourage you to take your nausea medication as prescribed.  If you develop nausea and vomiting, or diarrhea that is not controlled by your medication, call the clinic.  The clinic phone number is [phone]. Office hours are Monday-Friday 8:30am-5:00pm.  BELOW ARE SYMPTOMS THAT SHOULD BE REPORTED IMMEDIATELY:  *FEVER GREATER THAN 101.0 F  *CHILLS WITH OR WITHOUT FEVER  NAUSEA AND VOMITING THAT IS NOT CONTROLLED WITH YOUR NAUSEA MEDICATION  *UNUSUAL SHORTNESS OF BREATH  *UNUSUAL BRUISING OR BLEEDING  TENDERNESS IN MOUTH AND THROAT WITH OR WITHOUT PRESENCE OF ULCERS  *URINARY PROBLEMS  *BOWEL PROBLEMS  UNUSUAL RASH Items with * indicate a potential emergency and should be followed up as soon as possible. If you have an emergency after office hours please contact your primary care physician or go to the nearest emergency department.  Please call the clinic during office hours if you have any questions or concerns.   You may also contact the Patient Navigator at [phone] should you have any questions or need assistance in obtaining follow up care.      Resources For Cancer Patients and their Caregivers ? American Cancer Society: Can assist with transportation, wigs, general needs, runs Look Good Feel Better.        (317)207-9954 ? Cancer Care: Provides financial assistance, online support groups, medication/co-pay assistance.  1-800-813-HOPE 781-048-6254) ? [facility] Assists [place] cancer patients and their families through emotional , educational and financial support.  951-379-8223 ? [place] DSS Where to apply for food stamps, Medicaid and utility assistance. 918-095-0134 ? RCATS: Transportation to medical appointments. 320-236-1269 ? Social Security Administration: May apply for disability if have a Stage IV cancer. (445)839-7246 534-326-6816 ? [employer], Disability and Transit Services: Assists with nutrition, care and transit needs. 937-227-8370

## 2015-10-27 NOTE — Assessment & Plan Note (Addendum)
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  Now with significant disease progression in liver requiring a change in therapy to systemic Paclitaxel beginning on 10/13/2015.  Oncology history is updated.  Labs today: CBC diff, CMET.  I personally reviewed and went over laboratory results with the patient.  The results are noted within this dictation.    Right leg pain resolved with steroid pulse prescribed ~ 2 weeks ago.  Imaging was performed and unimpressive.  She has been "experimenting" with her home medications.  She notes nausea following her medications at home, but 2 days ago, she did not take any of her home medications and "felt great" without any nausea.  She restarted her medications yesterday and noted nausea.  She denies any new medications.  I will ask her to follow-up with Dr. Karie Kirks regarding this issue.  I have asked her to take an anti-emetic 30-60 minutes prior to medication consumption to see if that helps.  She notes fatigue ~ 3 days after chemotherapy, but otherwise does well.  Ongoing weight loss is noted.  I will ask Burtis Junes, RD to follow-up with the patient.  Pain contract in place with [facility].  Oxycodone was refilled today.  DO NOT RESUSCITATE.  DNR form filled out and provided to the patient on 10/24/2014.  Delton See is due today.  Oncology Flowsheet 09/25/2015  denosumab (XGEVA) [place] 120 mg    Labs day 1 of each cycle: CBC diff, CMET, CA 27.29, CA15-3.  Labs with each treatment: CBC diff, CMET.   Return in 2 weeks for follow-up as scheduled.  Addendum: Burtis Junes saw the patient today.  She reported to him that she is nauseated and therefore this interferes with her appetite.  He suggested Marinol as Megace was not effective.  Additionally, I am concerned for depression as a contributing factor.  As a result, I have printed Rxs for Marinol and Lexapro.  I have called her pharmacy and they filled Effexor last in August (30  day supply).

## 2015-10-27 NOTE — Progress Notes (Signed)
RD following up with patient secondary to new weight loss and poor appetite noted by PA  Contacted Pt by Visiting during her infusion   Wt Readings from Last 10 Encounters:  10/27/15 241 lb 8 oz (109.5 kg)  10/20/15 244 lb 12.8 oz (111 kg)  10/13/15 246 lb 6.4 oz (111.8 kg)  10/07/15 251 lb 4.8 oz (114 kg)  10/01/15 251 lb 4.8 oz (114 kg)  08/28/15 259 lb 12.8 oz (117.8 kg)  07/31/15 258 lb (117 kg)  07/03/15 256 lb (116.1 kg)  06/05/15 263 lb 14.4 oz (119.7 kg)  05/08/15 266 lb (120.7 kg)   Patient weight has Decreased by 10 lbs in the last 3 weeks  Patient reports oral intake as poor and is suffering from a very poor appetite, inconsistently controlled nausea, and taste changes. She has been tried on Megace and states it didn't help her.   Pt is only eating 1 meal a day. The first time she eats is 5 pm. She drinks almost exclusively water and eats mostly fruit. Her Dinner yesterday was 3 chicken wings and spinach. She eats a lot of spinach.   We first discussed supplements. She does not like Ensure or Boost at all. RD goes over the benefits a supplement could have on her weight and energy. She is strongly encouraged to go try different supplement. Many names of different supplements are given.   Her nausea is another inhibitor of PO intake. She says occasionly when she takes her meds in the morning she feels very nauseous, other times not. She says her anti-nausea medication control is inconsistent. Will f/u with PA regarding this.  Her taste changes have been an ongoing obstacle. She says these start midway through her meal. She is told that the best way to counter this to eat small meals frequently throughout the day. She should eat until the taste changes present.   She essentially fasts 23 hrs a day with her 1 meal and snacks of fruit and water. RD asked her to eat just 1 bite of food in the morning. We can work up from there  RD asked her to decrease in her intake of fruits,  vegetables and water in exchange for much more calorically dense foods and beverages. Her caregiver says "I thought spinach was good for you". RD explains that while fruits, vegetables and water for the general population, they are not the best choices for weight maintenance. RD asked her to eat more protein sources and soda, juice, tea. She says that, starting yesterday, she has begun to drink 1 glass of whole milk daily. She is encouraged to continue to do this.   RD reccommended protein powder and taught how to effectively use it.  Very slight amounts of Unflavored whey protein powder should be added to her liquids: soups, cereal, drinks, hot cereals. It can even be sprinkled on solids items. The idea is to add small enough amount that she does not notice it is there and thereby not altering her foods. If she tastes the grittiness, this is too much. Explained this may only add 20-30 kcals and a few grams of protein, but this will add up over time.   Finally, RD discussed the importance of nutrition in her treatment in general. Talked about how her body needs nutrients to rebuilds it's cells that are being killed by the chemotherapy and it further stresses her body when it has to shift to a catabolic state. She may need to start viewing  food as medicine.   Pt was very lethargic and seemed to nod off at times. RD wrote the recommendations/changes that were discussed on a piece of paper  Findings and recommendations were discussed with MD & PA. Marinol may be appropriate to help with nausea and appetite, though insurance may not cover.   RD will continue to follow pt  Burtis Junes RD, LDN, [employer] [facility] Pager: [phone] 10/27/2015 10:30 AM

## 2015-10-27 NOTE — Addendum Note (Signed)
Addended by: Baird [facility] on: 10/27/2015 10:46 AM   Modules accepted: Orders

## 2015-11-10 ENCOUNTER — Encounter (HOSPITAL_BASED_OUTPATIENT_CLINIC_OR_DEPARTMENT_OTHER): Payer: Medicare Other

## 2015-11-10 ENCOUNTER — Ambulatory Visit (HOSPITAL_COMMUNITY): Payer: Medicare Other | Admitting: Oncology

## 2015-11-10 VITALS — BP 149/85 | HR 94 | Temp 97.9°F | Resp 18 | Wt 250.0 lb

## 2015-11-10 DIAGNOSIS — C787 Secondary malignant neoplasm of liver and intrahepatic bile duct: Secondary | ICD-10-CM | POA: Diagnosis not present

## 2015-11-10 DIAGNOSIS — C50919 Malignant neoplasm of unspecified site of unspecified female breast: Secondary | ICD-10-CM | POA: Diagnosis not present

## 2015-11-10 DIAGNOSIS — C7951 Secondary malignant neoplasm of bone: Secondary | ICD-10-CM

## 2015-11-10 DIAGNOSIS — Z5111 Encounter for antineoplastic chemotherapy: Secondary | ICD-10-CM

## 2015-11-10 LAB — CBC WITH DIFFERENTIAL/PLATELET
BASOS PCT: 1 %
Basophils Absolute: 0 10*3/uL (ref 0.0–0.1)
Eosinophils Absolute: 0.1 10*3/uL (ref 0.0–0.7)
Eosinophils Relative: 1 %
HEMATOCRIT: 35.6 % — AB (ref 36.0–46.0)
HEMOGLOBIN: 11.4 g/dL — AB (ref 12.0–15.0)
LYMPHS ABS: 2.3 10*3/uL (ref 0.7–4.0)
Lymphocytes Relative: 36 %
MCH: 27.3 pg (ref 26.0–34.0)
MCHC: 32 g/dL (ref 30.0–36.0)
MCV: 85.2 fL (ref 78.0–100.0)
MONO ABS: 0.9 10*3/uL (ref 0.1–1.0)
MONOS PCT: 14 %
NEUTROS ABS: 3 10*3/uL (ref 1.7–7.7)
NEUTROS PCT: 48 %
Platelets: 204 10*3/uL (ref 150–400)
RBC: 4.18 MIL/uL (ref 3.87–5.11)
RDW: 16.3 % — AB (ref 11.5–15.5)
WBC: 6.3 10*3/uL (ref 4.0–10.5)

## 2015-11-10 LAB — COMPREHENSIVE METABOLIC PANEL
ALBUMIN: 3 g/dL — AB (ref 3.5–5.0)
ALK PHOS: 367 U/L — AB (ref 38–126)
ALT: 56 U/L — ABNORMAL HIGH (ref 14–54)
ANION GAP: 5 (ref 5–15)
AST: 73 U/L — ABNORMAL HIGH (ref 15–41)
BILIRUBIN TOTAL: 0.7 mg/dL (ref 0.3–1.2)
BUN: 19 mg/dL (ref 6–20)
CALCIUM: 9.8 mg/dL (ref 8.9–10.3)
CO2: 28 mmol/L (ref 22–32)
Chloride: 102 mmol/L (ref 101–111)
Creatinine, Ser: 1.3 mg/dL — ABNORMAL HIGH (ref 0.44–1.00)
GFR, EST AFRICAN AMERICAN: 50 mL/min — AB (ref >60)
GFR, EST NON AFRICAN AMERICAN: 43 mL/min — AB (ref >60)
GLUCOSE: 130 mg/dL — AB (ref 65–99)
POTASSIUM: 4.6 mmol/L (ref 3.5–5.1)
Sodium: 135 mmol/L (ref 135–145)
TOTAL PROTEIN: 6.5 g/dL (ref 6.5–8.1)

## 2015-11-10 MED ORDER — DEXAMETHASONE SODIUM PHOSPHATE 10 MG/ML IJ SOLN
10.0000 mg | Freq: Once | INTRAMUSCULAR | Status: AC
  Administered 2015-11-10: 10 mg via INTRAVENOUS
  Filled 2015-11-10: qty 1

## 2015-11-10 MED ORDER — DEXTROSE 5 % IV SOLN
80.0000 mg/m2 | Freq: Once | INTRAVENOUS | Status: AC
  Administered 2015-11-10: 186 mg via INTRAVENOUS
  Filled 2015-11-10: qty 31

## 2015-11-10 MED ORDER — SODIUM CHLORIDE 0.9 % IV SOLN
Freq: Once | INTRAVENOUS | Status: AC
  Administered 2015-11-10: 10:00:00 via INTRAVENOUS

## 2015-11-10 MED ORDER — DIPHENHYDRAMINE HCL 50 MG/ML IJ SOLN
50.0000 mg | Freq: Once | INTRAMUSCULAR | Status: AC
  Administered 2015-11-10: 50 mg via INTRAVENOUS
  Filled 2015-11-10: qty 1

## 2015-11-10 MED ORDER — HEPARIN SOD (PORK) LOCK FLUSH 100 UNIT/ML IV SOLN
500.0000 [IU] | Freq: Once | INTRAVENOUS | Status: AC | PRN
  Administered 2015-11-10: 500 [IU]
  Filled 2015-11-10: qty 5

## 2015-11-10 MED ORDER — DEXAMETHASONE SODIUM PHOSPHATE 100 MG/10ML IJ SOLN: 10.0000 mg | Freq: Once | INTRAMUSCULAR | Status: DC

## 2015-11-10 MED ORDER — FAMOTIDINE IN NACL 20-0.9 MG/50ML-% IV SOLN
20.0000 mg | Freq: Once | INTRAVENOUS | Status: AC
  Administered 2015-11-10: 20 mg via INTRAVENOUS
  Filled 2015-11-10: qty 50

## 2015-11-10 MED ORDER — SODIUM CHLORIDE 0.9% FLUSH
10.0000 mL | INTRAVENOUS | Status: DC | PRN
  Administered 2015-11-10: 10 mL
  Filled 2015-11-10: qty 10

## 2015-11-10 NOTE — Progress Notes (Signed)
Tolerated chemo well. Stable on discharge home with family via wheelchair. 

## 2015-11-10 NOTE — Patient Instructions (Signed)
[facility] Discharge Instructions for Patients Receiving Chemotherapy   Beginning January 23rd 2017 lab work for the Cancer Center will be done in the  Main lab at [facility] on 1st floor. If you have a lab appointment with the Cancer Center please come in thru the  Main Entrance and check in at the main information desk   Today you received the following chemotherapy agents Taxol. Follow-up as scheduled. Call clinic for any questions or concerns  To help prevent nausea and vomiting after your treatment, we encourage you to take your nausea medication   If you develop nausea and vomiting, or diarrhea that is not controlled by your medication, call the clinic.  The clinic phone number is [phone]. Office hours are Monday-Friday 8:30am-5:00pm.  BELOW ARE SYMPTOMS THAT SHOULD BE REPORTED IMMEDIATELY:  *FEVER GREATER THAN 101.0 F  *CHILLS WITH OR WITHOUT FEVER  NAUSEA AND VOMITING THAT IS NOT CONTROLLED WITH YOUR NAUSEA MEDICATION  *UNUSUAL SHORTNESS OF BREATH  *UNUSUAL BRUISING OR BLEEDING  TENDERNESS IN MOUTH AND THROAT WITH OR WITHOUT PRESENCE OF ULCERS  *URINARY PROBLEMS  *BOWEL PROBLEMS  UNUSUAL RASH Items with * indicate a potential emergency and should be followed up as soon as possible. If you have an emergency after office hours please contact your primary care physician or go to the nearest emergency department.  Please call the clinic during office hours if you have any questions or concerns.   You may also contact the Patient Navigator at [phone] should you have any questions or need assistance in obtaining follow up care.      Resources For Cancer Patients and their Caregivers ? [employer]: Can assist with transportation, wigs, general needs, runs Look Good Feel Better.        1-888-227-6333 ? Cancer Care: Provides financial assistance, online support groups, medication/co-pay assistance.  1-800-813-HOPE  (4673) ? [facility] Assists [place] cancer patients and their families through emotional , educational and financial support.  336-427-4357 ? [place] DSS Where to apply for food stamps, Medicaid and utility assistance. 336-342-1394 ? [facility]: Transportation to medical appointments. 336-347-2287 ? Social Security Administration: May apply for disability if have a Stage IV cancer. 336-342-7796 1-800-772-1213 ? [place] [facility], [facility] Transit [facility]: Assists with nutrition, care and transit needs. 336-349-2343         

## 2015-11-10 NOTE — Progress Notes (Signed)
Kaylee Mitchell tolerated chemo tx well without complaints or incident. Labs reviewed prior to administering chemo. Abdominal pain was 8 on admission to clinic but decreased to 4 upon discharge after medication taken at home was effective. VSS upon discharge. Pt discharged via wheelchair in satisfactory condition

## 2015-11-11 LAB — CANCER ANTIGEN 15-3: CA 15-3: 74.3 U/mL — ABNORMAL HIGH (ref 0.0–25.0)

## 2015-11-11 LAB — CANCER ANTIGEN 27.29: CA 27.29: 108 U/mL — ABNORMAL HIGH (ref 0.0–38.6)

## 2015-11-14 ENCOUNTER — Emergency Department (HOSPITAL_COMMUNITY): Payer: Medicare Other

## 2015-11-14 ENCOUNTER — Emergency Department (HOSPITAL_COMMUNITY)
Admission: EM | Admit: 2015-11-14 | Discharge: 2015-11-14 | Disposition: A | Discharge status: Home / Self Care | Payer: Medicare Other | Attending: Emergency Medicine | Admitting: Emergency Medicine

## 2015-11-14 ENCOUNTER — Encounter (HOSPITAL_COMMUNITY): Payer: Self-pay | Admitting: Emergency Medicine

## 2015-11-14 DIAGNOSIS — R600 Localized edema: Secondary | ICD-10-CM | POA: Diagnosis not present

## 2015-11-14 DIAGNOSIS — N189 Chronic kidney disease, unspecified: Secondary | ICD-10-CM | POA: Insufficient documentation

## 2015-11-14 DIAGNOSIS — Z794 Long term (current) use of insulin: Secondary | ICD-10-CM | POA: Insufficient documentation

## 2015-11-14 DIAGNOSIS — M79661 Pain in right lower leg: Secondary | ICD-10-CM

## 2015-11-14 DIAGNOSIS — R531 Weakness: Secondary | ICD-10-CM | POA: Diagnosis not present

## 2015-11-14 DIAGNOSIS — I129 Hypertensive chronic kidney disease with stage 1 through stage 4 chronic kidney disease, or unspecified chronic kidney disease: Secondary | ICD-10-CM | POA: Insufficient documentation

## 2015-11-14 DIAGNOSIS — Z7982 Long term (current) use of aspirin: Secondary | ICD-10-CM | POA: Diagnosis not present

## 2015-11-14 DIAGNOSIS — R1084 Generalized abdominal pain: Secondary | ICD-10-CM | POA: Diagnosis not present

## 2015-11-14 DIAGNOSIS — Z79899 Other long term (current) drug therapy: Secondary | ICD-10-CM | POA: Insufficient documentation

## 2015-11-14 DIAGNOSIS — E1122 Type 2 diabetes mellitus with diabetic chronic kidney disease: Secondary | ICD-10-CM | POA: Insufficient documentation

## 2015-11-14 DIAGNOSIS — R109 Unspecified abdominal pain: Secondary | ICD-10-CM

## 2015-11-14 LAB — COMPREHENSIVE METABOLIC PANEL
ALK PHOS: 336 U/L — AB (ref 38–126)
ALT: 64 U/L — ABNORMAL HIGH (ref 14–54)
ANION GAP: 7 (ref 5–15)
AST: 85 U/L — ABNORMAL HIGH (ref 15–41)
Albumin: 3.1 g/dL — ABNORMAL LOW (ref 3.5–5.0)
BILIRUBIN TOTAL: 1.2 mg/dL (ref 0.3–1.2)
BUN: 19 mg/dL (ref 6–20)
CALCIUM: 9.2 mg/dL (ref 8.9–10.3)
CO2: 29 mmol/L (ref 22–32)
Chloride: 101 mmol/L (ref 101–111)
Creatinine, Ser: 1.17 mg/dL — ABNORMAL HIGH (ref 0.44–1.00)
GFR calc Af Amer: 57 mL/min — ABNORMAL LOW (ref >60)
GFR, EST NON AFRICAN AMERICAN: 49 mL/min — AB (ref >60)
Glucose, Bld: 107 mg/dL — ABNORMAL HIGH (ref 65–99)
POTASSIUM: 3.8 mmol/L (ref 3.5–5.1)
Sodium: 137 mmol/L (ref 135–145)
TOTAL PROTEIN: 7 g/dL (ref 6.5–8.1)

## 2015-11-14 LAB — CBC
HEMATOCRIT: 38 % (ref 36.0–46.0)
HEMOGLOBIN: 12.4 g/dL (ref 12.0–15.0)
MCH: 27.7 pg (ref 26.0–34.0)
MCHC: 32.6 g/dL (ref 30.0–36.0)
MCV: 85 fL (ref 78.0–100.0)
Platelets: 192 10*3/uL (ref 150–400)
RBC: 4.47 MIL/uL (ref 3.87–5.11)
RDW: 15.9 % — AB (ref 11.5–15.5)
WBC: 5.3 10*3/uL (ref 4.0–10.5)

## 2015-11-14 LAB — LIPASE, BLOOD: Lipase: 15 U/L (ref 11–51)

## 2015-11-14 LAB — URINALYSIS, ROUTINE W REFLEX MICROSCOPIC
BILIRUBIN URINE: NEGATIVE
Glucose, UA: NEGATIVE mg/dL
KETONES UR: NEGATIVE mg/dL
NITRITE: NEGATIVE
PH: 5.5 (ref 5.0–8.0)
Protein, ur: 100 mg/dL — AB
SPECIFIC GRAVITY, URINE: 1.025 (ref 1.005–1.030)

## 2015-11-14 LAB — LACTIC ACID, PLASMA: LACTIC ACID, VENOUS: 1.3 mmol/L (ref 0.5–1.9)

## 2015-11-14 LAB — URINE MICROSCOPIC-ADD ON

## 2015-11-14 LAB — I-STAT CG4 LACTIC ACID, ED: LACTIC ACID, VENOUS: 0.9 mmol/L (ref 0.5–1.9)

## 2015-11-14 MED ORDER — DICYCLOMINE HCL 10 MG/ML IM SOLN
20.0000 mg | Freq: Once | INTRAMUSCULAR | Status: AC
  Administered 2015-11-14: 20 mg via INTRAMUSCULAR
  Filled 2015-11-14: qty 2

## 2015-11-14 MED ORDER — SODIUM CHLORIDE 0.9 % IV SOLN
INTRAVENOUS | Status: DC
  Administered 2015-11-14: 500 mL via INTRAVENOUS

## 2015-11-14 MED ORDER — IOPAMIDOL (ISOVUE-300) INJECTION 61%
INTRAVENOUS | Status: AC
  Filled 2015-11-14: qty 30

## 2015-11-14 MED ORDER — ONDANSETRON HCL 4 MG/2ML IJ SOLN
4.0000 mg | INTRAMUSCULAR | Status: DC | PRN
  Administered 2015-11-14: 4 mg via INTRAVENOUS
  Filled 2015-11-14: qty 2

## 2015-11-14 MED ORDER — SODIUM CHLORIDE 0.9 % IV BOLUS (SEPSIS)
500.0000 mL | Freq: Once | INTRAVENOUS | Status: AC
  Administered 2015-11-14: 500 mL via INTRAVENOUS

## 2015-11-14 MED ORDER — HEPARIN SOD (PORK) LOCK FLUSH 100 UNIT/ML IV SOLN
INTRAVENOUS | Status: AC
  Filled 2015-11-14: qty 5

## 2015-11-14 NOTE — ED Provider Notes (Signed)
[facility] DEPT Provider Note   CSN: [id number] Arrival date & time: 11/14/15  0720     History   Chief Complaint Chief Complaint  Patient presents with  . Abdominal Pain    HPI Kaylee Mitchell is a 60 y.o. female.  HPI  Pt was seen at 0740.  Per pt, c/o gradual onset and persistence of constant generalized abd "pain" for the past 3 days. Describes the abd pain as "sharp."  Pt also c/o generalized weakness/fatigue since her last chemo on 11/10/15, as well as right lateral lower leg "burning" pain. Pt endorses hx of "knee problems" and "needs a referral to an Orthopedic doctor." Denies injury, no focal motor weakness, no tingling/numbness in extremities, no LBP. Denies N/V/diarrhea, no fevers, no back pain, no rash, no CP/SOB, no black or blood in stools, no dysuria/hematuria.      Past Medical History:  Diagnosis Date  . Arthritis   . Bone metastasis ([place]) 09/13/2010  . Cancer ([place])    rt breast is primary  . Depression   . Depression 12/14/2011  . Diabetes mellitus    type II  . DNR (do not resuscitate) 11/05/2014  . GERD (gastroesophageal reflux disease)   . High cholesterol   . Hypertension   . 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 Knee pain, bilateral 2013  . Opioid contract exists with [facility] 10/24/2014   Contract found in Chart review in letter tab.    Patient Active Problem List   Diagnosis Date Noted  . Liver metastases ([place]) 10/01/2015  . Low blood potassium 07/15/2015  . DNR (do not resuscitate) 11/05/2014  . Opioid contract exists with [facility] 10/24/2014  . Lesion of liver   . URI, acute 11/06/2012  . Lumbar back pain 08/07/2012  . Tinea versicolor 02/11/2012  . 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 CKD (chronic kidney disease) 01/12/2012  . Hyperlipidemia 01/12/2012  . Essential hypertension, benign 01/12/2012  . Morbid obesity ([place]) 01/12/2012  . Depression 12/14/2011   . Difficulty in walking(719.7) 03/25/2011  . S/P arthroscopy of left knee 03/21/2011  . Medial meniscus, posterior horn derangement 03/09/2011  . 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 Bone metastasis ([place]) 09/13/2010    Past Surgical History:  Procedure Laterality Date  . ABDOMINAL HYSTERECTOMY    . KNEE SURGERY Bilateral   . MASTECTOMY     right side  . PORTACATH PLACEMENT  10/09/2015   Procedure: INSERTION PORT-A-CATH LEFT SUBCLAVIAN;  Surgeon: Aviva Signs, MD;  Location: [facility];  Service: General;;    OB History    Gravida Para Term Preterm AB Living   0 0 0 0 0 0   SAB TAB Ectopic Multiple Live Births   0 0 0 0 0       Home Medications    Prior to Admission medications   Medication Sig Start Date End Date Taking? Authorizing Provider  aspirin EC 81 MG tablet Take 81 mg by mouth daily.      Historical Provider, MD  atorvastatin (LIPITOR) 20 MG tablet TAKE ONE TABLET BY MOUTH ONCE DAILY 03/27/14   Alycia Rossetti, MD  calcium-vitamin D (OSCAL WITH D) 500-200 MG-UNIT per tablet Take 1 tablet by mouth 2 (two) times daily.     Historical Provider, MD  carvedilol (COREG) 12.5 MG tablet TAKE ONE TABLET BY MOUTH TWICE DAILY WITH MEALS 08/19/13   Alycia Rossetti, MD  denosumab (  XGEVA) 120 MG/1.7ML SOLN injection Inject 120 mg into the skin every 30 (thirty) days.    Historical Provider, MD  dronabinol (MARINOL) 5 MG capsule Take 1 capsule (5 mg total) by mouth 2 (two) times daily before a meal. 10/27/15   Baird Cancer, PA-C  escitalopram (LEXAPRO) 10 MG tablet Take 1 tablet (10 mg total) by mouth daily. 10/27/15   Baird Cancer, PA-C  Insulin Glargine (LANTUS) 100 UNIT/ML Solostar Pen Inject 70 Units into the skin at bedtime.  11/06/12   Alycia Rossetti, MD  LORazepam (ATIVAN PO) Take 1 tablet by mouth daily as needed (anxiety).     Historical Provider, MD  losartan-hydrochlorothiazide (HYZAAR) 50-12.5 MG per tablet Take 1 tablet by mouth daily. 01/14/14    Historical Provider, MD  metFORMIN (GLUCOPHAGE) 500 MG tablet Take 500 mg by mouth 2 (two) times daily with a meal.    Historical Provider, MD  NOVOLOG FLEXPEN 100 UNIT/ML FlexPen Inject into the skin. Per sliding scale. 07/27/15   Historical Provider, MD  omeprazole (PRILOSEC) 40 MG capsule TAKE 1 CAPSULE (40 MG TOTAL) BY MOUTH DAILY. 03/31/15   Baird Cancer, PA-C  ondansetron (ZOFRAN) 8 MG tablet Take 1 tablet (8 mg total) by mouth 2 (two) times daily as needed (Nausea or vomiting). 10/02/15   Patrici Ranks, MD  Oxycodone HCl 10 MG TABS Take 1 tablet every 4 hours as needed for pain. 10/20/15   Baird Cancer, PA-C  potassium chloride SA (K-DUR,KLOR-CON) 20 MEQ tablet Take 1 tablet (20 mEq total) by mouth daily. 07/15/15   Baird Cancer, PA-C  prochlorperazine (COMPAZINE) 10 MG tablet Take 1 tablet (10 mg total) by mouth every 6 (six) hours as needed (Nausea or vomiting). 10/02/15   Patrici Ranks, MD  temazepam (RESTORIL) 15 MG capsule Take 15 mg by mouth at bedtime as needed.  07/26/15   Historical Provider, MD    Family History Family History  Problem Relation Age of Onset  . Diabetes Mother   . Diabetes Father   . Arthritis    . Cancer    . Diabetes    . Cancer Maternal Aunt   . Anesthesia problems Neg Hx   . Hypotension Neg Hx   . Malignant hyperthermia Neg Hx   . Pseudochol deficiency Neg Hx     Social History Social History  Substance Use Topics  . Smoking status: Never Smoker  . Smokeless tobacco: Never Used  . Alcohol use No     Allergies   Review of patient's allergies indicates no known allergies.   Review of Systems Review of Systems ROS: Statement: All systems negative except as marked or noted in the HPI; Constitutional: Negative for fever and chills. ; ; Eyes: Negative for eye pain, redness and discharge. ; ; ENMT: Negative for ear pain, hoarseness, nasal congestion, sinus pressure and sore throat. ; ; Cardiovascular: Negative for chest pain,  palpitations, diaphoresis, dyspnea and peripheral edema. ; ; Respiratory: Negative for cough, wheezing and stridor. ; ; Gastrointestinal: +abd pain. Negative for nausea, vomiting, diarrhea, blood in stool, hematemesis, jaundice and rectal bleeding. . ; ; Genitourinary: Negative for dysuria, flank pain and hematuria. ; ; Musculoskeletal: +right knee/lower leg pain. Negative for back pain and neck pain. Negative for swelling and trauma.; ; Skin: Negative for pruritus, rash, abrasions, blisters, bruising and skin lesion.; ; Neuro: Negative for headache, lightheadedness and neck stiffness. Negative for weakness, altered level of consciousness, altered mental status, extremity weakness, paresthesias,  involuntary movement, seizure and syncope.      Physical Exam Updated Vital Signs BP 145/73 (BP Location: Left Arm)   Pulse 109   Temp 97.9 F (36.6 C) (Oral)   Resp 18   Ht 5\' 5"  (1.651 m)   Wt 250 lb (113.4 kg)   SpO2 98%   BMI 41.60 kg/m     08:07 Orthostatic Vital Signs KS  Orthostatic Lying   BP- Lying: 129/73  Pulse- Lying: 101      Orthostatic Sitting  BP- Sitting: 119/79  Pulse- Sitting: 108      Orthostatic Standing at 0 minutes  BP- Standing at 0 minutes: 116/83  Pulse- Standing at 0 minutes: 126    Physical Exam 0745: Physical examination:  Nursing notes reviewed; Vital signs and O2 SAT reviewed;  Constitutional: Well developed, Well nourished, Well hydrated, In no acute distress; Head:  Normocephalic, atraumatic; Eyes: EOMI, PERRL, No scleral icterus; ENMT: Mouth and pharynx normal, Mucous membranes moist; Neck: Supple, Full range of motion, No lymphadenopathy; Cardiovascular: Regular rate and rhythm, No gallop; Respiratory: Breath sounds clear & equal bilaterally, No wheezes.  Speaking full sentences with ease, Normal respiratory effort/excursion; Chest: Nontender, Movement normal; Abdomen: Soft, +mild generalized tenderness to palp. No rebound or guarding. Nondistended,  Normal bowel sounds; Genitourinary: No CVA tenderness; Extremities: Pulses normal,  +FROM right knee, including able to lift extended RLE off stretcher, and extend right lower leg against resistance.  No ligamentous laxity.  No patellar or quad tendon step-offs.  NMS intact right foot, strong pedal pp, muscle compartments soft. +plantarflexion of right foot w/calf squeeze.  No palpable gap right Achilles's tendon.  No proximal fibular head tenderness.  No edema, erythema, warmth, ecchymosis or deformity.  No specific area of point tenderness. No calf tenderness, rash, edema or asymmetry.; Neuro: AA&Ox3, Major CN grossly intact.  Speech clear. No gross focal motor or sensory deficits in extremities.; Skin: Color normal, Warm, Dry.    ED Treatments / Results  Labs (all labs ordered are listed, but only abnormal results are displayed)   EKG  EKG Interpretation None       Radiology   Procedures Procedures (including critical care time)  Medications Ordered in ED Medications  ondansetron (ZOFRAN) injection 4 mg (not administered)  dicyclomine (BENTYL) injection 20 mg (not administered)     Initial Impression / Assessment and Plan / ED Course  I have reviewed the triage vital signs and the nursing notes.  Pertinent labs & imaging results that were available during my care of the patient were reviewed by me and considered in my medical decision making (see chart for details).  MDM Reviewed: previous chart, nursing note and vitals Reviewed previous: labs, x-ray and CT scan Interpretation: labs, x-ray, CT scan and ultrasound   Results for orders placed or performed during the hospital encounter of 11/14/15  Lipase, blood  Result Value Ref Range   Lipase 15 11 - 51 U/L  Comprehensive metabolic panel  Result Value Ref Range   Sodium 137 135 - 145 mmol/L   Potassium 3.8 3.5 - 5.1 mmol/L   Chloride 101 101 - 111 mmol/L   CO2 29 22 - 32 mmol/L   Glucose, Bld 107 (H) 65 - 99 mg/dL     BUN 19 6 - 20 mg/dL   Creatinine, Ser 1.17 (H) 0.44 - 1.00 mg/dL   Calcium 9.2 8.9 - 10.3 mg/dL   Total Protein 7.0 6.5 - 8.1 g/dL   Albumin 3.1 (L) 3.5 -  5.0 g/dL   AST 85 (H) 15 - 41 U/L   ALT 64 (H) 14 - 54 U/L   Alkaline Phosphatase 336 (H) 38 - 126 U/L   Total Bilirubin 1.2 0.3 - 1.2 mg/dL   GFR calc non Af Amer 49 (L) >60 mL/min   GFR calc Af Amer 57 (L) >60 mL/min   Anion gap 7 5 - 15  CBC  Result Value Ref Range   WBC 5.3 4.0 - 10.5 K/uL   RBC 4.47 3.87 - 5.11 MIL/uL   Hemoglobin 12.4 12.0 - 15.0 g/dL   HCT 38.0 36.0 - 46.0 %   MCV 85.0 78.0 - 100.0 fL   MCH 27.7 26.0 - 34.0 pg   MCHC 32.6 30.0 - 36.0 g/dL   RDW 15.9 (H) 11.5 - 15.5 %   Platelets 192 150 - 400 K/uL  Urinalysis, Routine w reflex microscopic  Result Value Ref Range   Color, Urine YELLOW YELLOW   APPearance HAZY (A) CLEAR   Specific Gravity, Urine 1.025 1.005 - 1.030   pH 5.5 5.0 - 8.0   Glucose, UA NEGATIVE NEGATIVE mg/dL   Hgb urine dipstick TRACE (A) NEGATIVE   Bilirubin Urine NEGATIVE NEGATIVE   Ketones, ur NEGATIVE NEGATIVE mg/dL   Protein, ur 100 (A) NEGATIVE mg/dL   Nitrite NEGATIVE NEGATIVE   Leukocytes, UA TRACE (A) NEGATIVE  Urine microscopic-add on  Result Value Ref Range   Squamous Epithelial / LPF TOO NUMEROUS TO COUNT (A) NONE SEEN   WBC, UA 6-30 0 - 5 WBC/hpf   RBC / HPF 0-5 0 - 5 RBC/hpf   Bacteria, UA MANY (A) NONE SEEN  Lactic acid, plasma  Result Value Ref Range   Lactic Acid, Venous 1.3 0.5 - 1.9 mmol/L   Ct Abdomen Pelvis Wo Contrast Result Date: 11/14/2015 CLINICAL DATA:  Generalized abdominal pain. EXAM: CT ABDOMEN AND PELVIS WITHOUT CONTRAST TECHNIQUE: Multidetector CT imaging of the abdomen and pelvis was performed following the standard protocol without IV contrast. COMPARISON:  09/30/2015 MR abdomen, CT abdomen 08/21/2015 FINDINGS: Lower chest: No acute abnormality. Hepatobiliary: Innumerable ill-defined hypodense masses throughout the liver most concerning for  metastatic disease. Small cholelithiasis. Gallbladder is otherwise normal. No pericholecystic fluid or inflammatory changes. No intrahepatic or extrahepatic biliary ductal dilatation. Pancreas: Unremarkable. No pancreatic ductal dilatation or surrounding inflammatory changes. Spleen: Normal in size without focal abnormality. Adrenals/Urinary Tract: Adrenal glands are unremarkable. Kidneys are normal, without renal calculi, focal lesion, or hydronephrosis. Mild relative bladder wall thickening which may be secondary to underdistention versus cystitis. Stomach/Bowel: Small hiatal hernia. Stomach is within normal limits. Appendix appears normal. No evidence of bowel wall thickening, distention, or inflammatory changes. Vascular/Lymphatic: Normal caliber abdominal aorta with mild atherosclerosis. No lymphadenopathy. Reproductive: Status post hysterectomy. No adnexal masses. Other: No abdominal wall hernia.  No abdominal or pelvic free fluid. Musculoskeletal: Diffuse scattered sclerotic osseous lesions throughout the axial and visualize appendicular skeleton consistent with known osseous metastatic disease without significant interval change compared with 08/21/2015. No acute osseous abnormality. Mild osteoarthritis of bilateral sacroiliac joints. IMPRESSION: 1. Diffuse sclerotic osseous metastatic disease unchanged compared with 08/21/2015. 2. Innumerable ill-defined hypodense masses throughout the liver consistent with metastatic disease. 3. Tiny cholelithiasis. Electronically Signed   By: Kathreen Devoid   On: 11/14/2015 09:37    US Venous Img Lower Unilateral Right Result Date: 11/14/2015 CLINICAL DATA:  Right lower extremity pain and edema. History of metastatic breast carcinoma. EXAM: RIGHT LOWER EXTREMITY VENOUS DOPPLER ULTRASOUND TECHNIQUE: Gray-scale sonography with  graded compression, as well as color Doppler and duplex ultrasound were performed to evaluate the lower extremity deep venous systems from the  level of the common femoral vein and including the common femoral, femoral, profunda femoral, popliteal and calf veins including the posterior tibial, peroneal and gastrocnemius veins when visible. The superficial great saphenous vein was also interrogated. Spectral Doppler was utilized to evaluate flow at rest and with distal augmentation maneuvers in the common femoral, femoral and popliteal veins. COMPARISON:  None. FINDINGS: Contralateral Common Femoral Vein: Respiratory phasicity is normal and symmetric with the symptomatic side. No evidence of thrombus. Normal compressibility. Common Femoral Vein: No evidence of thrombus. Normal compressibility, respiratory phasicity and response to augmentation. Saphenofemoral Junction: No evidence of thrombus. Normal compressibility and flow on color Doppler imaging. Profunda Femoral Vein: No evidence of thrombus. Normal compressibility and flow on color Doppler imaging. Femoral Vein: No evidence of thrombus. Normal compressibility, respiratory phasicity and response to augmentation. Popliteal Vein: No evidence of thrombus. Normal compressibility, respiratory phasicity and response to augmentation. Calf Veins: No evidence of thrombus. Normal compressibility and flow on color Doppler imaging. Superficial Great Saphenous Vein: No evidence of thrombus. Normal compressibility and flow on color Doppler imaging. Venous Reflux:  None. Other Findings: No evidence of superficial thrombophlebitis or abnormal fluid collection. IMPRESSION: No evidence of right lower extremity deep venous thrombosis. Electronically Signed   By: Aletta Edouard M.D.   On: 11/14/2015 10:01   Dg Knee Complete 4 Views Right Result Date: 11/14/2015 CLINICAL DATA:  Right lower leg pain worsening over the last 2 days. EXAM: RIGHT KNEE - COMPLETE 4+ VIEW COMPARISON:  None. FINDINGS: No acute fracture or dislocation. No lytic or sclerotic osseous lesion. Generalized osteopenia. 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 Mild lateral femorotibial compartment joint space narrowing marginal osteophytosis. No significant medial femorotibial compartment joint space narrowing. Mild patellofemoral compartment joint space narrowing. No significant joint effusion. IMPRESSION: 1.  No acute osseous injury of the right knee. Electronically Signed   By: Kathreen Devoid   On: 11/14/2015 08:44    Results for CALLEE, DRUSCHEL (MRN [id number]) as of 11/14/2015 10:11  Ref. Range 10/13/2015 09:39 10/20/2015 08:36 10/27/2015 08:41 11/10/2015 09:28 11/14/2015 07:34  BUN Latest Ref Range: 6 - 20 mg/dL 23 (H) 20 25 (H) 19 19  Creatinine Latest Ref Range: 0.44 - 1.00 mg/dL 1.35 (H) 1.01 (H) 1.45 (H) 1.30 (H) 1.17 (H)    1040:  Udip appears contaminated, UC pending; pt denies dysuria.  Pt has tol PO well while in the ED without N/V.  No stooling while in the ED.  Abd benign, VSS. Pt not orthostatic on VS. Feels better and wants to go home now. Dx and testing d/w pt and family.  Questions answered.  Verb understanding, agreeable to d/c home with outpt f/u.      Final Clinical Impressions(s) / ED Diagnoses   Final diagnoses:  None    New Prescriptions New Prescriptions   No medications on file     Francine Graven, DO 11/16/15 2048

## 2015-11-14 NOTE — ED Triage Notes (Signed)
PT c/o generalized sharp abdominal pains x3 days with no n/v/d or urinary symptoms. Pt also c/o generalized weakness since chemo on 11/10/15. PT states normal BM this am.

## 2015-11-14 NOTE — Discharge Instructions (Signed)
Take your usual prescriptions as previously directed.  Increase your fluid intake (ie:  Gatoraide) for the next few days, as discussed.  Eat a bland diet and advance to your regular diet slowly as you can tolerate it.  Apply moist heat or ice to the area(s) of discomfort, for 15 minutes at a time, several times per day for the next few days.  Do not fall asleep on a heating or ice pack.  Call your regular medical doctor and your Orthopedist on Monday to schedule a follow up appointment this week.  Return to the Emergency Department immediately if worsening.

## 2015-11-16 ENCOUNTER — Other Ambulatory Visit (HOSPITAL_COMMUNITY): Payer: Self-pay | Admitting: Oncology

## 2015-11-16 DIAGNOSIS — E876 Hypokalemia: Secondary | ICD-10-CM

## 2015-11-16 LAB — URINE CULTURE

## 2015-11-17 ENCOUNTER — Other Ambulatory Visit (HOSPITAL_COMMUNITY): Payer: Self-pay | Admitting: Oncology

## 2015-11-17 ENCOUNTER — Encounter (HOSPITAL_BASED_OUTPATIENT_CLINIC_OR_DEPARTMENT_OTHER): Payer: Medicare Other

## 2015-11-17 VITALS — BP 139/80 | HR 103 | Temp 98.2°F | Resp 18 | Wt 239.8 lb

## 2015-11-17 DIAGNOSIS — C7951 Secondary malignant neoplasm of bone: Secondary | ICD-10-CM

## 2015-11-17 DIAGNOSIS — C787 Secondary malignant neoplasm of liver and intrahepatic bile duct: Secondary | ICD-10-CM | POA: Diagnosis not present

## 2015-11-17 DIAGNOSIS — Z5111 Encounter for antineoplastic chemotherapy: Secondary | ICD-10-CM | POA: Diagnosis present

## 2015-11-17 DIAGNOSIS — C50919 Malignant neoplasm of unspecified site of unspecified female breast: Secondary | ICD-10-CM

## 2015-11-17 LAB — COMPREHENSIVE METABOLIC PANEL
ALK PHOS: 339 U/L — AB (ref 38–126)
ALT: 59 U/L — AB (ref 14–54)
AST: 73 U/L — AB (ref 15–41)
Albumin: 3 g/dL — ABNORMAL LOW (ref 3.5–5.0)
Anion gap: 7 (ref 5–15)
BILIRUBIN TOTAL: 0.5 mg/dL (ref 0.3–1.2)
BUN: 23 mg/dL — AB (ref 6–20)
CALCIUM: 8.4 mg/dL — AB (ref 8.9–10.3)
CHLORIDE: 100 mmol/L — AB (ref 101–111)
CO2: 28 mmol/L (ref 22–32)
CREATININE: 1.36 mg/dL — AB (ref 0.44–1.00)
GFR calc Af Amer: 48 mL/min — ABNORMAL LOW (ref >60)
GFR, EST NON AFRICAN AMERICAN: 41 mL/min — AB (ref >60)
Glucose, Bld: 207 mg/dL — ABNORMAL HIGH (ref 65–99)
Potassium: 4.4 mmol/L (ref 3.5–5.1)
Sodium: 135 mmol/L (ref 135–145)
Total Protein: 6.8 g/dL (ref 6.5–8.1)

## 2015-11-17 LAB — CBC WITH DIFFERENTIAL/PLATELET
BASOS ABS: 0 10*3/uL (ref 0.0–0.1)
Basophils Relative: 1 %
Eosinophils Absolute: 0.1 10*3/uL (ref 0.0–0.7)
Eosinophils Relative: 1 %
HEMATOCRIT: 36 % (ref 36.0–46.0)
HEMOGLOBIN: 11.4 g/dL — AB (ref 12.0–15.0)
LYMPHS ABS: 2.1 10*3/uL (ref 0.7–4.0)
LYMPHS PCT: 36 %
MCH: 27.3 pg (ref 26.0–34.0)
MCHC: 31.7 g/dL (ref 30.0–36.0)
MCV: 86.1 fL (ref 78.0–100.0)
Monocytes Absolute: 0.3 10*3/uL (ref 0.1–1.0)
Monocytes Relative: 6 %
NEUTROS ABS: 3.3 10*3/uL (ref 1.7–7.7)
NEUTROS PCT: 56 %
PLATELETS: 245 10*3/uL (ref 150–400)
RBC: 4.18 MIL/uL (ref 3.87–5.11)
RDW: 16 % — ABNORMAL HIGH (ref 11.5–15.5)
WBC: 5.9 10*3/uL (ref 4.0–10.5)

## 2015-11-17 MED ORDER — FAMOTIDINE IN NACL 20-0.9 MG/50ML-% IV SOLN
20.0000 mg | Freq: Once | INTRAVENOUS | Status: AC
  Administered 2015-11-17: 20 mg via INTRAVENOUS
  Filled 2015-11-17: qty 50

## 2015-11-17 MED ORDER — SODIUM CHLORIDE 0.9% FLUSH
10.0000 mL | INTRAVENOUS | Status: DC | PRN
  Administered 2015-11-17: 10 mL
  Filled 2015-11-17: qty 10

## 2015-11-17 MED ORDER — OXYCODONE HCL 10 MG PO TABS: ORAL_TABLET | ORAL | 0 refills | Status: DC

## 2015-11-17 MED ORDER — DIPHENHYDRAMINE HCL 50 MG/ML IJ SOLN
50.0000 mg | Freq: Once | INTRAMUSCULAR | Status: AC
  Administered 2015-11-17: 50 mg via INTRAVENOUS
  Filled 2015-11-17: qty 1

## 2015-11-17 MED ORDER — DEXAMETHASONE SODIUM PHOSPHATE 10 MG/ML IJ SOLN
10.0000 mg | Freq: Once | INTRAMUSCULAR | Status: DC
  Filled 2015-11-17: qty 1

## 2015-11-17 MED ORDER — PACLITAXEL CHEMO INJECTION 300 MG/50ML
80.0000 mg/m2 | Freq: Once | INTRAVENOUS | Status: AC
  Administered 2015-11-17: 186 mg via INTRAVENOUS
  Filled 2015-11-17: qty 31

## 2015-11-17 MED ORDER — HEPARIN SOD (PORK) LOCK FLUSH 100 UNIT/ML IV SOLN
500.0000 [IU] | Freq: Once | INTRAVENOUS | Status: AC | PRN
  Administered 2015-11-17: 500 [IU]
  Filled 2015-11-17: qty 5

## 2015-11-17 MED ORDER — DEXAMETHASONE SODIUM PHOSPHATE 10 MG/ML IJ SOLN
10.0000 mg | Freq: Once | INTRAMUSCULAR | Status: AC
  Administered 2015-11-17: 10 mg via INTRAVENOUS

## 2015-11-17 MED ORDER — SODIUM CHLORIDE 0.9 % IV SOLN
Freq: Once | INTRAVENOUS | Status: AC
  Administered 2015-11-17: 12:00:00 via INTRAVENOUS

## 2015-11-17 NOTE — Patient Instructions (Signed)
[facility] Discharge Instructions for Patients Receiving Chemotherapy   Beginning January 23rd 2017 lab work for the Cancer Center will be done in the  Main lab at [facility] on 1st floor. If you have a lab appointment with the Cancer Center please come in thru the  Main Entrance and check in at the main information desk   Today you received the following chemotherapy agents Taxol. Follow-up as scheduled. Call clinic for any questions or concerns  To help prevent nausea and vomiting after your treatment, we encourage you to take your nausea medication   If you develop nausea and vomiting, or diarrhea that is not controlled by your medication, call the clinic.  The clinic phone number is [phone]. Office hours are Monday-Friday 8:30am-5:00pm.  BELOW ARE SYMPTOMS THAT SHOULD BE REPORTED IMMEDIATELY:  *FEVER GREATER THAN 101.0 F  *CHILLS WITH OR WITHOUT FEVER  NAUSEA AND VOMITING THAT IS NOT CONTROLLED WITH YOUR NAUSEA MEDICATION  *UNUSUAL SHORTNESS OF BREATH  *UNUSUAL BRUISING OR BLEEDING  TENDERNESS IN MOUTH AND THROAT WITH OR WITHOUT PRESENCE OF ULCERS  *URINARY PROBLEMS  *BOWEL PROBLEMS  UNUSUAL RASH Items with * indicate a potential emergency and should be followed up as soon as possible. If you have an emergency after office hours please contact your primary care physician or go to the nearest emergency department.  Please call the clinic during office hours if you have any questions or concerns.   You may also contact the Patient Navigator at [phone] should you have any questions or need assistance in obtaining follow up care.      Resources For Cancer Patients and their Caregivers ? American Cancer Society: Can assist with transportation, wigs, general needs, runs Look Good Feel Better.        1-888-227-6333 ? Cancer Care: Provides financial assistance, online support groups, medication/co-pay assistance.  1-800-813-HOPE  (4673) ? [facility] Assists [place] cancer patients and their families through emotional , educational and financial support.  336-427-4357 ? [place] DSS Where to apply for food stamps, Medicaid and utility assistance. 336-342-1394 ? RCATS: Transportation to medical appointments. 336-347-2287 ? Social Security Administration: May apply for disability if have a Stage IV cancer. 336-342-7796 1-800-772-1213 ? [place] Aging, Disability and Transit Services: Assists with nutrition, care and transit needs. 336-349-2343         

## 2015-11-17 NOTE — Progress Notes (Signed)
Kaylee Mitchell tolerated chemo tx well without complaints. Labs reviewed prior to administering chemotherapy. VSS upon discharge. Pt discharged self ambulatory in satisfactory condition with family

## 2015-11-20 ENCOUNTER — Telehealth (HOSPITAL_COMMUNITY): Payer: Self-pay

## 2015-11-20 ENCOUNTER — Other Ambulatory Visit (HOSPITAL_COMMUNITY): Payer: Medicare Other

## 2015-11-20 ENCOUNTER — Encounter (HOSPITAL_COMMUNITY): Payer: Medicare Other

## 2015-11-20 DIAGNOSIS — T451X5A Adverse effect of antineoplastic and immunosuppressive drugs, initial encounter: Principal | ICD-10-CM

## 2015-11-20 DIAGNOSIS — G620 Drug-induced polyneuropathy: Secondary | ICD-10-CM

## 2015-11-20 MED ORDER — DULOXETINE HCL 30 MG PO CPEP: ORAL_CAPSULE | ORAL | 0 refills | Status: DC

## 2015-11-20 NOTE — Telephone Encounter (Signed)
Patient called because she was having numbness and tingling in hands and also pain, tingling and burning in legs. Notified PA who ordered Cymbalta. Explained to patient how to take it and she verbalized understanding. Prescription sent to patients pharmacy.

## 2015-11-24 ENCOUNTER — Ambulatory Visit (HOSPITAL_COMMUNITY): Payer: Medicare Other

## 2015-11-24 ENCOUNTER — Encounter (HOSPITAL_BASED_OUTPATIENT_CLINIC_OR_DEPARTMENT_OTHER): Payer: Medicare Other

## 2015-11-24 ENCOUNTER — Encounter (HOSPITAL_COMMUNITY): Payer: Medicare Other | Attending: Hematology & Oncology | Admitting: Hematology & Oncology

## 2015-11-24 ENCOUNTER — Encounter (HOSPITAL_COMMUNITY): Payer: Self-pay | Admitting: Hematology & Oncology

## 2015-11-24 ENCOUNTER — Ambulatory Visit (HOSPITAL_COMMUNITY): Payer: Medicare Other | Admitting: Oncology

## 2015-11-24 VITALS — BP 120/69 | HR 120 | Temp 97.8°F | Resp 18 | Wt 242.0 lb

## 2015-11-24 DIAGNOSIS — Z66 Do not resuscitate: Secondary | ICD-10-CM

## 2015-11-24 DIAGNOSIS — Z7189 Other specified counseling: Secondary | ICD-10-CM | POA: Insufficient documentation

## 2015-11-24 DIAGNOSIS — C50919 Malignant neoplasm of unspecified site of unspecified female breast: Secondary | ICD-10-CM | POA: Insufficient documentation

## 2015-11-24 DIAGNOSIS — C787 Secondary malignant neoplasm of liver and intrahepatic bile duct: Secondary | ICD-10-CM

## 2015-11-24 DIAGNOSIS — C7951 Secondary malignant neoplasm of bone: Secondary | ICD-10-CM | POA: Insufficient documentation

## 2015-11-24 DIAGNOSIS — F4321 Adjustment disorder with depressed mood: Secondary | ICD-10-CM

## 2015-11-24 LAB — CBC WITH DIFFERENTIAL/PLATELET
BASOS ABS: 0 10*3/uL (ref 0.0–0.1)
BASOS PCT: 1 %
EOS PCT: 1 %
Eosinophils Absolute: 0 10*3/uL (ref 0.0–0.7)
HCT: 35.2 % — ABNORMAL LOW (ref 36.0–46.0)
Hemoglobin: 11.5 g/dL — ABNORMAL LOW (ref 12.0–15.0)
LYMPHS PCT: 48 %
Lymphs Abs: 1.5 10*3/uL (ref 0.7–4.0)
MCH: 27.7 pg (ref 26.0–34.0)
MCHC: 32.7 g/dL (ref 30.0–36.0)
MCV: 84.8 fL (ref 78.0–100.0)
MONO ABS: 0.2 10*3/uL (ref 0.1–1.0)
Monocytes Relative: 8 %
NEUTROS ABS: 1.3 10*3/uL — AB (ref 1.7–7.7)
Neutrophils Relative %: 42 %
PLATELETS: 269 10*3/uL (ref 150–400)
RBC: 4.15 MIL/uL (ref 3.87–5.11)
RDW: 15.8 % — AB (ref 11.5–15.5)
WBC: 3 10*3/uL — AB (ref 4.0–10.5)

## 2015-11-24 LAB — COMPREHENSIVE METABOLIC PANEL
ALBUMIN: 3 g/dL — AB (ref 3.5–5.0)
ALT: 57 U/L — AB (ref 14–54)
AST: 55 U/L — AB (ref 15–41)
Alkaline Phosphatase: 326 U/L — ABNORMAL HIGH (ref 38–126)
Anion gap: 6 (ref 5–15)
BUN: 25 mg/dL — AB (ref 6–20)
CHLORIDE: 101 mmol/L (ref 101–111)
CO2: 27 mmol/L (ref 22–32)
CREATININE: 1.36 mg/dL — AB (ref 0.44–1.00)
Calcium: 8.4 mg/dL — ABNORMAL LOW (ref 8.9–10.3)
GFR calc Af Amer: 48 mL/min — ABNORMAL LOW (ref >60)
GFR, EST NON AFRICAN AMERICAN: 41 mL/min — AB (ref >60)
GLUCOSE: 329 mg/dL — AB (ref 65–99)
POTASSIUM: 4.4 mmol/L (ref 3.5–5.1)
Sodium: 134 mmol/L — ABNORMAL LOW (ref 135–145)
Total Bilirubin: 0.6 mg/dL (ref 0.3–1.2)
Total Protein: 6.8 g/dL (ref 6.5–8.1)

## 2015-11-24 MED ORDER — HEPARIN SOD (PORK) LOCK FLUSH 100 UNIT/ML IV SOLN
INTRAVENOUS | Status: AC
  Filled 2015-11-24: qty 5

## 2015-11-24 MED ORDER — HEPARIN SOD (PORK) LOCK FLUSH 100 UNIT/ML IV SOLN
500.0000 [IU] | Freq: Once | INTRAVENOUS | Status: AC
  Administered 2015-11-24: 500 [IU] via INTRAVENOUS

## 2015-11-24 MED ORDER — SODIUM CHLORIDE 0.9% FLUSH
20.0000 mL | INTRAVENOUS | Status: DC | PRN
  Administered 2015-11-24: 20 mL via INTRAVENOUS
  Filled 2015-11-24: qty 20

## 2015-11-24 NOTE — Patient Instructions (Addendum)
[place] at [facility] Discharge Instructions  RECOMMENDATIONS MADE BY THE CONSULTANT AND ANY TEST RESULTS WILL BE SENT TO YOUR REFERRING PHYSICIAN.  Labs drawn from port today with port flushed per protocol. Chemo tx held per Dr. Whitney Muse. Follow-up as scheduled. Call clinic for any questions or concerns  Thank you for choosing [place] at [facility] to provide your oncology and hematology care.  To afford each patient quality time with our provider, please arrive at least 15 minutes before your scheduled appointment time.   Beginning January 23rd 2017 lab work for the [facility] will be done in the  Main lab at [facility] on 1st floor. If you have a lab appointment with the [facility] please come in thru the  Main Entrance and check in at the main information desk  You need to re-schedule your appointment should you arrive 10 or more minutes late.  We strive to give you quality time with our providers, and arriving late affects you and other patients whose appointments are after yours.  Also, if you no show three or more times for appointments you may be dismissed from the clinic at the providers discretion.     Again, thank you for choosing [facility].  Our hope is that these requests will decrease the amount of time that you wait before being seen by our physicians.       _____________________________________________________________  Should you have questions after your visit to [facility], please contact our office at [phone] between the hours of 8:30 a.m. and 4:30 p.m.  Voicemails left after 4:30 p.m. will not be returned until the following business day.  For prescription refill requests, have your pharmacy contact our office.         Resources For Cancer Patients and their Caregivers ? American Cancer Society: Can assist with transportation, wigs, general needs, runs Look Good Feel Better.         458-860-0698 ? Cancer Care: Provides financial assistance, online support groups, medication/co-pay assistance.  1-800-813-HOPE 570-771-0599) ? [facility] Assists [place] cancer patients and their families through emotional , educational and financial support.  (256)576-5880 ? [place] DSS Where to apply for food stamps, Medicaid and utility assistance. 2101842840 ? RCATS: Transportation to medical appointments. (510)346-0070 ? Social Security Administration: May apply for disability if have a Stage IV cancer. 416-098-3946 985 742 8977 ? [employer], Disability and Transit Services: Assists with nutrition, care and transit needs. [facility] Support Programs: @10RELATIVEDAYS @ > Cancer Support Group  2nd Tuesday of the month 1pm-2pm, Journey Room  > Creative Journey  3rd Tuesday of the month 1130am-1pm, Journey Room  > Look Good Feel Better  1st Wednesday of the month 10am-12 noon, Journey Room (Call [facility] to register [phone])

## 2015-11-24 NOTE — Progress Notes (Signed)
Kaylee Mitchell's chemo tx and [facility] See being held this week per Dr. Whitney Muse after MD appt Pt not feeling well with complaints of numbness/tingling in hands and extremely fatigued.Labs drawn thru port then port flushed per protocol and de-accessed.Pt discharged via wheelchair in satisfactory condition with family memeber

## 2015-11-24 NOTE — Patient Instructions (Addendum)
[place] at [facility] Discharge Instructions  RECOMMENDATIONS MADE BY THE CONSULTANT AND ANY TEST RESULTS WILL BE SENT TO YOUR REFERRING PHYSICIAN.  Exam and discussion with Dr. Whitney Muse today. Hold chemotherapy today. Return in one week for treatment. Increase your Marinol to 10 mg TID. STOP TAKING YOUR LEXAPRO. Take Cymbalta as prescribed. Call the clinic with any questions or concerns.    Thank you for choosing [place] at [facility] to provide your oncology and hematology care.  To afford each patient quality time with our provider, please arrive at least 15 minutes before your scheduled appointment time.   Beginning January 23rd 2017 lab work for the [facility] will be done in the  Main lab at [facility] on 1st floor. If you have a lab appointment with the [facility] please come in thru the  Main Entrance and check in at the main information desk  You need to re-schedule your appointment should you arrive 10 or more minutes late.  We strive to give you quality time with our providers, and arriving late affects you and other patients whose appointments are after yours.  Also, if you no show three or more times for appointments you may be dismissed from the clinic at the providers discretion.     Again, thank you for choosing [facility].  Our hope is that these requests will decrease the amount of time that you wait before being seen by our physicians.       _____________________________________________________________  Should you have questions after your visit to [facility], please contact our office at [phone] between the hours of 8:30 a.m. and 4:30 p.m.  Voicemails left after 4:30 p.m. will not be returned until the following business day.  For prescription refill requests, have your pharmacy contact our office.         Resources For Cancer Patients and their Caregivers ? American  Cancer Society: Can assist with transportation, wigs, general needs, runs Look Good Feel Better.        (629) 460-0952 ? Cancer Care: Provides financial assistance, online support groups, medication/co-pay assistance.  1-800-813-HOPE 215-128-7274) ? [place] Assists [place] cancer patients and their families through emotional , educational and financial support.  315-715-0898 ? [place] DSS Where to apply for food stamps, Medicaid and utility assistance. 513-750-1873 ? [facility]: Transportation to medical appointments. (409)754-6793 ? Social Security Administration: May apply for disability if have a Stage IV cancer. 224 740 9511 267 455 4036 ? [employer], Disability and Transit Services: Assists with nutrition, care and transit needs. [place] Support Programs: @10RELATIVEDAYS @ > Cancer Support Group  2nd Tuesday of the month 1pm-2pm, Journey Room  > Creative Journey  3rd Tuesday of the month 1130am-1pm, Journey Room  > Look Good Feel Better  1st Wednesday of the month 10am-12 noon, Journey Room (Call [place] to register [phone])

## 2015-11-24 NOTE — Progress Notes (Signed)
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 7 cm primary tumor, extensive LVI. With focal extenion to the anterior surface margin, 8 of 8 lymph nodes were involved with metastatic disease.    ER positive 100%, PR positive 99%, HER2 neu negative, Ki-67 marker 14%.   Mastectomy followed by radiation therapy to T4 through T7 due to severe involvement at this area.  Arimidex initially from 07/2009 through 03/23/2010 and was progressing by bone scan criteria. We switched her to tamoxifen.   Because of worsening bone pain, MRI of lumbar spine completed on 11/29/2012 demonstrated progressive diffuse lumbar spine metastatic disease and therefore therapy was changed to Fulvestrant and Exemestane on 12/11/2012, tolerating well except for vasomotor instability    Invasive ductal carcinoma of breast (Kaylee Mitchell)   05/26/2009 Initial Diagnosis    Needle core biopsy showed invasive mammary carcinoma with 1 + lymph node      06/03/2009 Surgery    Right modified radical mastectomy with 8/8 + lymph nodes by Dr. Ronnell Mitchell      07/22/2009 Cancer Staging    Bone scan + for multiple osseous lesions.  No disease elsewhere.      07/24/2009 - 03/23/2010 Chemotherapy    Arimidex      03/23/2010 Progression         03/23/2010 - 12/05/2012 Chemotherapy    Tamoxifen      11/28/2012 Progression    Worsening of lumbar spine osseous disease      12/06/2012 - 09/25/2014 Chemotherapy    Fulvestrant + Exemestane      01/24/2014 PET scan    No evidence of hypermetabolic recurrent or metastatic disease. Widespread osseous metastasis, without significant marrow hypermetabolism.      09/18/2014 Progression    PET- New hepatic metastasis within the LEFT hepatic lobe involving segments IVA, IVB, and III.      10/20/2014 - 11/13/2014 Chemotherapy    Afinitor + Exemestane      11/13/2014  Adverse Reaction    Increasing fatigue      11/13/2014 Treatment Plan Change    Hold Afinitor, will reduce dose to 7.5 mg daily.  Continue Exemestane and Xgeva.      11/24/2014 - 01/16/2015 Chemotherapy    Afinitor 7.5 mg daily, Exemestane daily, and continuation of Xgeva.      01/16/2015 Adverse Reaction    Cough, ?Afinitor-induced      01/16/2015 - 02/02/2015 Chemotherapy    HOLD AFFINITOR secondary to cough, weight loss, Continue EXEMESTANE and Xgeva      01/30/2015 PET scan    Significant partial metabolic treatment response in liver metastases, nonenlarged R level 2 cervical LN, non specific, stable sclerotic osseous metastases throughout skeleton      02/02/2015 Adverse Reaction    Weight loss, decreased appetite      02/02/2015 Treatment Plan Change    Afinitor dose further reduced to 5 mg.      02/02/2015 -  Chemotherapy    Afinitor 5 mg and Exemestane with continuation of Xgeva      06/03/2015 Imaging    CT CAP- Hepatic metastatic disease appears to have improved slightly in the interval from 01/30/2015. Diffuse osseous metastatic disease.      08/21/2015 Imaging    CT CAP- Stable CT of the chest. No evidence for metastatic disease in the thorax. Overall, no substantial change in pre-existing liver lesions.  There may be a new 10 mm lesion in the posterior right dome of liver, not seen previously.       09/30/2015 Imaging    MRI liver- 1. Innumerable (> than 30) confluent liver metastases throughout the liver, significantly better visualized at MRI compared to prior CT and PET-CT studies, which limits comparison. By best estimate, there has been progression of the liver metastases compared to the 06/03/2015 CT study. 2. Patchy lesions throughout the thoracolumbar spine, correlating with the known extensive sclerotic osseous metastatic disease as seen on multiple prior CT studies, not appreciably changed.      09/30/2015 Progression    MRI of liver demonstrates  significant progression of disease and climbing CA 27.29 and CA 15-3.      10/09/2015 Procedure    Port placement by Dr. Arnoldo Mitchell      10/13/2015 -  Chemotherapy    The patient had PACLitaxel (TAXOL) 138 mg in dextrose 5 % 250 mL chemo infusion (Dose modification: 60 mg/m2 (original dose 80 mg/m2, Cycle 1, Reason: Change in LFTs), 60 mg/m2 (original dose 80 mg/m2, Cycle 1, Reason: Change in LFTs)  for chemotherapy treatment.        CURRENT THERAPY: TAXOL  INTERVAL HISTORY:  Kaylee Mitchell 59 y.o. female returns for follow-up of her breast cancer, stage IV disease.   Patient is accompanied by sister and is here for ongoing weekly paclitaxel.   Patient was in the ED on 10/28 for general weakness, fatigue, and abdominal pain.  She states she does not feel well. Patient reports abdominal pain, nausea, vomiting,  fatigue, and difficulty sleeping .Kaylee Mitchell says she cannot do much throughout the day. Denies depression, but is easily agitated. Sister, however, insists Kaylee Mitchell is depressed. Kaylee Mitchell does not think about her disease or dying. Sister says they do not discuss the future, but they know what to expect. They lost a sister with leukemia. Kaylee Mitchell  Sister will be making medical decisions for her if she is ever unable to make her on decisions. However, patient, says she already signed do not resuscitate.  Patient's sister says usually fatigue and sickness does not begin until third day after receiving chemotherapy.    MEDICAL HISTORY: Past Medical History:  Diagnosis Date  . Arthritis   . Bone metastasis ([place]) 09/13/2010  . Cancer ([place])    rt breast is primary  . Depression   . Depression 12/14/2011  . Diabetes mellitus    type II  . Mitchell (do not resuscitate) 11/05/2014  . GERD (gastroesophageal reflux disease)   . High cholesterol   . Hypertension   . Invasive ductal carcinoma of breast ([place]) 09/13/2010  . Knee pain, bilateral 2013  . Opioid contract exists with [facility] 10/24/2014   Contract found in Chart review in letter tab.    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     has No Known Allergies.  Ms. Twaddell had no medications administered during this visit.  SURGICAL HISTORY: Past Surgical History:  Procedure Laterality Date  . ABDOMINAL HYSTERECTOMY    .  KNEE SURGERY Bilateral   . MASTECTOMY     right side  . PORTACATH PLACEMENT  10/09/2015   Procedure: INSERTION PORT-A-CATH LEFT SUBCLAVIAN;  Surgeon: Aviva Signs, MD;  Location: [facility];  Service: General;;    SOCIAL HISTORY: Social History   Social History  . Marital status: Single    Spouse name: N/A  . Number of children: N/A  . Years of education: 74   Occupational History  . disabled Unemployed   Social History Main Topics  . Smoking status: Never Smoker  . Smokeless tobacco: Never Used  . Alcohol use No  . Drug use: No  . Sexual activity: Yes    Birth control/ protection: Surgical   Other Topics Concern  . Not on file   Social History Narrative  . No narrative on file    FAMILY HISTORY: Family History  Problem Relation Age of Onset  . Diabetes Mother   . Diabetes Father   . Arthritis    . Cancer    . Diabetes    . Cancer Maternal Aunt   . Anesthesia problems Neg Hx   . Hypotension Neg Hx   . Malignant hyperthermia Neg Hx   . Pseudochol deficiency Neg Hx     Review of Systems  Constitutional:   Positive for malaise/fatigue, weight loss, and loss of appetite. Negative for fever, chills and diaphoresis.      Loss of 20 lbs since September. HENT: Negative for congestion, ear discharge, ear pain, hearing loss, nosebleeds, sore throat and tinnitus.   Eyes: Negative.   Respiratory: Negative.  Negative for stridor.   Cardiovascular: Negative.   Gastrointestinal: Positive for abdominal pain. Genitourinary: Negative.   Musculoskeletal: Negative. Skin: Negative.   Neurological: Negative for dizziness, tingling, tremors, sensory change, speech change, focal weakness, seizures, headaches, loss of consciousness and weakness.  Endo/Heme/Allergies: Negative.   Psychiatric/Behavioral: Negative.   14 point review of systems was performed and is negative except as detailed under history of present illness and above  PHYSICAL EXAMINATION  ECOG PERFORMANCE STATUS: 2 - Symptomatic, <50% confined to bed  Vitals:   11/24/15 0840  BP: 120/69  Pulse: (!) 120  Resp: 18  Temp: 97.8 F (36.6 C)   Physical Exam  Constitutional: She is oriented to person, place, and time and well-developed, and in no distress. In wheelchair Obese.  HENT:  Head: Normocephalic and atraumatic.  Nose: Nose normal.  Mouth/Throat: Oropharynx is clear and moist. No oropharyngeal exudate.  Eyes: Conjunctivae and EOM are normal. Pupils are equal, round, and reactive to light. Right eye exhibits no discharge. Left eye exhibits no discharge. No scleral icterus.  Neck: Normal range of motion. Neck supple. No tracheal deviation present. No thyromegaly present.  Cardiovascular: Normal rate, regular rhythm and normal heart sounds.  Exam reveals no gallop and no friction rub.   No murmur heard. Pulmonary/Chest: Effort normal and breath sounds normal. She has no wheezes. She has no rales.  Abdominal: Soft. Bowel sounds are normal. She exhibits no distension and no mass. There is no tenderness. There is no rebound and no guarding.    Musculoskeletal: Normal range of motion. She exhibits no edema.  Lymphadenopathy:    She has no cervical adenopathy.  Neurological: She is alert and oriented to person, place, and time.No cranial nerve deficit.  Coordination normal.  Skin: Skin is warm and dry. No rash noted.  Psychiatric: Mood, memory, affect and judgment normal.  Nursing note and vitals reviewed.   LABORATORY DATA: I  have reviewed the results below as listed.    CBC    Component Value Date/Time   WBC 3.0 (L) 11/24/2015 0843   RBC 4.15 11/24/2015 0843   HGB 11.5 (L) 11/24/2015 0843   HCT 35.2 (L) 11/24/2015 0843   PLT 269 11/24/2015 0843   MCV 84.8 11/24/2015 0843   MCH 27.7 11/24/2015 0843   MCHC 32.7 11/24/2015 0843   RDW 15.8 (H) 11/24/2015 0843   LYMPHSABS 1.5 11/24/2015 0843   MONOABS 0.2 11/24/2015 0843   EOSABS 0.0 11/24/2015 0843   BASOSABS 0.0 11/24/2015 0843   CMP     Component Value Date/Time   NA 134 (L) 11/24/2015 0843   K 4.4 11/24/2015 0843   CL 101 11/24/2015 0843   CO2 27 11/24/2015 0843   GLUCOSE 329 (H) 11/24/2015 0843   BUN 25 (H) 11/24/2015 0843   CREATININE 1.36 (H) 11/24/2015 0843   CALCIUM 8.4 (L) 11/24/2015 0843   PROT 6.8 11/24/2015 0843   ALBUMIN 3.0 (L) 11/24/2015 0843   AST 55 (H) 11/24/2015 0843   ALT 57 (H) 11/24/2015 0843   ALKPHOS 326 (H) 11/24/2015 0843   BILITOT 0.6 11/24/2015 0843   GFRNONAA 41 (L) 11/24/2015 0843   GFRAA 48 (L) 11/24/2015 0843    RADIOLOGY  Ct Abdomen Pelvis Wo Contrast  Result Date: 11/14/2015 CLINICAL DATA:  Generalized abdominal pain. EXAM: CT ABDOMEN AND PELVIS WITHOUT CONTRAST TECHNIQUE: Multidetector CT imaging of the abdomen and pelvis was performed following the standard protocol without IV contrast. COMPARISON:  09/30/2015 MR abdomen, CT abdomen 08/21/2015 FINDINGS: Lower chest: No acute abnormality. Hepatobiliary: Innumerable ill-defined hypodense masses throughout the liver most concerning for metastatic disease. Small  cholelithiasis. Gallbladder is otherwise normal. No pericholecystic fluid or inflammatory changes. No intrahepatic or extrahepatic biliary ductal dilatation. Pancreas: Unremarkable. No pancreatic ductal dilatation or surrounding inflammatory changes. Spleen: Normal in size without focal abnormality. Adrenals/Urinary Tract: Adrenal glands are unremarkable. Kidneys are normal, without renal calculi, focal lesion, or hydronephrosis. Mild relative bladder wall thickening which may be secondary to underdistention versus cystitis. Stomach/Bowel: Small hiatal hernia. Stomach is within normal limits. Appendix appears normal. No evidence of bowel wall thickening, distention, or inflammatory changes. Vascular/Lymphatic: Normal caliber abdominal aorta with mild atherosclerosis. No lymphadenopathy. Reproductive: Status post hysterectomy. No adnexal masses. Other: No abdominal wall hernia.  No abdominal or pelvic free fluid. Musculoskeletal: Diffuse scattered sclerotic osseous lesions throughout the axial and visualize appendicular skeleton consistent with known osseous metastatic disease without significant interval change compared with 08/21/2015. No acute osseous abnormality. Mild osteoarthritis of bilateral sacroiliac joints. IMPRESSION: 1. Diffuse sclerotic osseous metastatic disease unchanged compared with 08/21/2015. 2. Innumerable ill-defined hypodense masses throughout the liver consistent with metastatic disease. 3. Tiny cholelithiasis. Electronically Signed   By: Kathreen Devoid   On: 11/14/2015 09:37   US Venous Img Lower Unilateral Right  Result Date: 11/14/2015 CLINICAL DATA:  Right lower extremity pain and edema. History of metastatic breast carcinoma. EXAM: RIGHT LOWER EXTREMITY VENOUS DOPPLER ULTRASOUND TECHNIQUE: Gray-scale sonography with graded compression, as well as color Doppler and duplex ultrasound were performed to evaluate the lower extremity deep venous systems from the level of the common femoral  vein and including the common femoral, femoral, profunda femoral, popliteal and calf veins including the posterior tibial, peroneal and gastrocnemius veins when visible. The superficial great saphenous vein was also interrogated. Spectral Doppler was utilized to evaluate flow at rest and with distal augmentation maneuvers in the common femoral, femoral and popliteal veins. COMPARISON:  None. FINDINGS: Contralateral Common Femoral Vein: Respiratory phasicity is normal and symmetric with the symptomatic side. No evidence of thrombus. Normal compressibility. Common Femoral Vein: No evidence of thrombus. Normal compressibility, respiratory phasicity and response to augmentation. Saphenofemoral Junction: No evidence of thrombus. Normal compressibility and flow on color Doppler imaging. Profunda Femoral Vein: No evidence of thrombus. Normal compressibility and flow on color Doppler imaging. Femoral Vein: No evidence of thrombus. Normal compressibility, respiratory phasicity and response to augmentation. Popliteal Vein: No evidence of thrombus. Normal compressibility, respiratory phasicity and response to augmentation. Calf Veins: No evidence of thrombus. Normal compressibility and flow on color Doppler imaging. Superficial Great Saphenous Vein: No evidence of thrombus. Normal compressibility and flow on color Doppler imaging. Venous Reflux:  None. Other Findings: No evidence of superficial thrombophlebitis or abnormal fluid collection. IMPRESSION: No evidence of right lower extremity deep venous thrombosis. Electronically Signed   By: Aletta Edouard M.D.   On: 11/14/2015 10:01   Dg Knee Complete 4 Views Right  Result Date: 11/14/2015 CLINICAL DATA:  Right lower leg pain worsening over the last 2 days. EXAM: RIGHT KNEE - COMPLETE 4+ VIEW COMPARISON:  None. FINDINGS: No acute fracture or dislocation. No lytic or sclerotic osseous lesion. Generalized osteopenia. Chondrocalcinosis of the medial and lateral  patellofemoral compartment as can be seen with CPPD. Mild lateral femorotibial compartment joint space narrowing marginal osteophytosis. No significant medial femorotibial compartment joint space narrowing. Mild patellofemoral compartment joint space narrowing. No significant joint effusion. IMPRESSION: 1.  No acute osseous injury of the right knee. Electronically Signed   By: Kathreen Devoid   On: 11/14/2015 08:44    PATHOLOGY: PORT OF SURGICAL PATHOLOGY ADDITIONAL INFORMATION: FLUORESCENCE IN-SITU HYBRIDIZATION Results: HER2 - NEGATIVE RATIO OF HER2/CEP17 SIGNALS 1.18 AVERAGE HER2 COPY NUMBER PER CELL 1.95 Reference Range: NEGATIVE HER2/CEP17 Ratio <2.0 and average HER2 copy number <4.0 EQUIVOCAL HER2/CEP17 Ratio <2.0 and average HER2 copy number >=4.0 and <6.0 POSITIVE HER2/CEP17 Ratio >=2.0 or <2.0 and average HER2 copy number >=6.0 Enid Cutter MD Pathologist, Electronic Signature ( Signed 10/07/2014) PROGNOSTIC INDICATORS Results: IMMUNOHISTOCHEMICAL AND MORPHOMETRIC ANALYSIS PERFORMED MANUALLY Estrogen Receptor: 100%, POSITIVE, STRONG STAINING INTENSITY Progesterone Receptor: 90%, POSITIVE, STRONG STAINING INTENSITY REFERENCE RANGE ESTROGEN RECEPTOR NEGATIVE 0% POSITIVE =>1% REFERENCE RANGE PROGESTERONE RECEPTOR NEGATIVE 0% POSITIVE =>1% All controls stained appropriately 1 of 3 FINAL for JIMMY, STIPES ([id number]) ADDITIONAL INFORMATION:(continued) Mali RUND DO Pathologist, Electronic Signature   ASSESSMENT and THERAPY PLAN:  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 Will sort this out. Ideally would like on 60 mg cymbalta daily. I have also increased her megace to 10  mg tid to try to improve her nausea and appetite.   She has been on zoloft and celexa in the past.  At her next visit will discuss potential referral to psychiatrist/counseling. She would benefit from support groups but I am not sure if she is willing, can also consider touching base with our [place] or social work.  Invasive ductal carcinoma of breast ([place]) Stage IV Breast cancer with bone involvement requiring Xgeva.  Now with significant disease progression in liver requiring a change in therapy to systemic weekly Paclitaxel beginning on 10/13/2015. Liver functions are slowly improving. She however has a marginal  PS. I feel there is definitely a component of depression.  Will delay therapy this week given her complaints of severe fatigue and reassess next week.    Labs today: CBC diff, CMET.  I personally reviewed and went over laboratory results with the patient.  The results are noted within this dictation.    She was in the ED about a week ago for generalized complaints.  We did discuss goals of care today.   Patient is not always compliant with medications.   She notes fatigue ~ 3 days after chemotherapy.  Ongoing weight loss is noted.  She has been seen by nutrition services. She simply refuses to eat because food does not taste good.     Goals of care, counseling/discussion DO NOT RESUSCITATE.  Mitchell form filled out and provided to the patient on 10/24/2014.  Not interested in stopping current therapy. However if PS does not improve and continues to decline may need to consider other options. If liver functions continue to improve. XELODA may be a reasonable choice for her.   Bone metastasis ([place]) Continue Xgeva, calcium, vitamin D  Mitchell (do not resuscitate) Mitchell established on 10/24/2014. Discussed goals of care with patient and her sister today. They are not unrealistic. Giah however wants to try to continue therapy.   Follow up with patient on 11/21.   All  questions were answered. The patient knows to call the clinic with any problems, questions or concerns. We can certainly see the patient much sooner if necessary.    This document serves as a record of services personally performed by Ancil Linsey, MD. It was created on her behalf by Elmyra Ricks, a trained medical scribe. The creation of this record is based on the scribe's personal observations and the provider's statements to them. This document has been checked and approved by the attending provider.  I have reviewed the above documentation for accuracy and completeness, and I agree with the above.  This note was electronically signed.  [place] Fam. Whitney Muse, MD

## 2015-11-24 NOTE — Assessment & Plan Note (Addendum)
Both lexapro and cymbalta are on med list. Will sort this out. Ideally would like on 60 mg cymbalta daily. I have also increased her megace to 10 mg tid to try to improve her nausea and appetite.   She has been on zoloft and celexa in the past.  At her next visit will discuss potential referral to psychiatrist/counseling. She would benefit from support groups but I am not sure if she is willing, can also consider touching base with our [place] or social work.

## 2015-11-24 NOTE — Assessment & Plan Note (Addendum)
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  Now with significant disease progression in liver requiring a change in therapy to systemic weekly Paclitaxel beginning on 10/13/2015. Liver functions are slowly improving. She however has a marginal PS. I feel there is definitely a component of depression.  Will delay therapy this week given her complaints of severe fatigue and reassess next week.    Labs today: CBC diff, CMET.  I personally reviewed and went over laboratory results with the patient.  The results are noted within this dictation.    She was in the ED about a week ago for generalized complaints.  We did discuss goals of care today.   Patient is not always compliant with medications.   She notes fatigue ~ 3 days after chemotherapy.  Ongoing weight loss is noted.  She has been seen by nutrition services. She simply refuses to eat because food does not taste good.

## 2015-11-24 NOTE — Assessment & Plan Note (Signed)
DO NOT RESUSCITATE.  DNR form filled out and provided to the patient on 10/24/2014.  Not interested in stopping current therapy. However if PS does not improve and continues to decline may need to consider other options. If liver functions continue to improve. XELODA may be a reasonable choice for her.

## 2015-11-25 ENCOUNTER — Telehealth (HOSPITAL_COMMUNITY): Payer: Self-pay

## 2015-11-25 NOTE — Telephone Encounter (Signed)
Patient called confused about her mediactions. Explained to patient she was supposed to continue the Cymbalta, stop the lexapro and increase the megace. Made patient read the bottles to me to be sure she had the correct medications. Encouraged her to call back if she had any further questions. She verbalized understanding.

## 2015-11-25 NOTE — Assessment & Plan Note (Signed)
DNR established on 10/24/2014. Discussed goals of care with patient and her sister today. They are not unrealistic. Kaylee Mitchell however wants to try to continue therapy.

## 2015-11-25 NOTE — Assessment & Plan Note (Signed)
Continue Xgeva, calcium, vitamin D

## 2015-12-01 ENCOUNTER — Encounter (HOSPITAL_COMMUNITY): Payer: Self-pay | Admitting: Emergency Medicine

## 2015-12-01 ENCOUNTER — Encounter (HOSPITAL_COMMUNITY): Payer: Self-pay | Admitting: Oncology

## 2015-12-01 ENCOUNTER — Encounter (HOSPITAL_BASED_OUTPATIENT_CLINIC_OR_DEPARTMENT_OTHER): Payer: Medicare Other

## 2015-12-01 ENCOUNTER — Encounter (HOSPITAL_BASED_OUTPATIENT_CLINIC_OR_DEPARTMENT_OTHER): Payer: Medicare Other | Admitting: Oncology

## 2015-12-01 ENCOUNTER — Other Ambulatory Visit (HOSPITAL_COMMUNITY): Payer: Self-pay | Admitting: Oncology

## 2015-12-01 VITALS — BP 148/83 | HR 110 | Temp 98.1°F | Resp 18 | Wt 240.0 lb

## 2015-12-01 DIAGNOSIS — C7951 Secondary malignant neoplasm of bone: Secondary | ICD-10-CM

## 2015-12-01 DIAGNOSIS — T451X5A Adverse effect of antineoplastic and immunosuppressive drugs, initial encounter: Principal | ICD-10-CM

## 2015-12-01 DIAGNOSIS — C50919 Malignant neoplasm of unspecified site of unspecified female breast: Secondary | ICD-10-CM

## 2015-12-01 DIAGNOSIS — G620 Drug-induced polyneuropathy: Secondary | ICD-10-CM

## 2015-12-01 DIAGNOSIS — C787 Secondary malignant neoplasm of liver and intrahepatic bile duct: Secondary | ICD-10-CM

## 2015-12-01 LAB — CBC WITH DIFFERENTIAL/PLATELET
BASOS ABS: 0.1 10*3/uL (ref 0.0–0.1)
Basophils Relative: 1 %
EOS PCT: 1 %
Eosinophils Absolute: 0 10*3/uL (ref 0.0–0.7)
HEMATOCRIT: 35.1 % — AB (ref 36.0–46.0)
Hemoglobin: 11.4 g/dL — ABNORMAL LOW (ref 12.0–15.0)
LYMPHS ABS: 1.9 10*3/uL (ref 0.7–4.0)
LYMPHS PCT: 33 %
MCH: 27.7 pg (ref 26.0–34.0)
MCHC: 32.5 g/dL (ref 30.0–36.0)
MCV: 85.4 fL (ref 78.0–100.0)
Monocytes Absolute: 0.9 10*3/uL (ref 0.1–1.0)
Monocytes Relative: 16 %
NEUTROS ABS: 2.8 10*3/uL (ref 1.7–7.7)
Neutrophils Relative %: 49 %
PLATELETS: 240 10*3/uL (ref 150–400)
RBC: 4.11 MIL/uL (ref 3.87–5.11)
RDW: 16.8 % — ABNORMAL HIGH (ref 11.5–15.5)
WBC: 5.7 10*3/uL (ref 4.0–10.5)

## 2015-12-01 LAB — COMPREHENSIVE METABOLIC PANEL
ALK PHOS: 405 U/L — AB (ref 38–126)
ALT: 50 U/L (ref 14–54)
AST: 63 U/L — AB (ref 15–41)
Albumin: 2.9 g/dL — ABNORMAL LOW (ref 3.5–5.0)
Anion gap: 8 (ref 5–15)
BILIRUBIN TOTAL: 0.5 mg/dL (ref 0.3–1.2)
BUN: 17 mg/dL (ref 6–20)
CALCIUM: 9.3 mg/dL (ref 8.9–10.3)
CHLORIDE: 103 mmol/L (ref 101–111)
CO2: 26 mmol/L (ref 22–32)
CREATININE: 1.1 mg/dL — AB (ref 0.44–1.00)
GFR, EST NON AFRICAN AMERICAN: 53 mL/min — AB (ref >60)
Glucose, Bld: 152 mg/dL — ABNORMAL HIGH (ref 65–99)
Potassium: 4.6 mmol/L (ref 3.5–5.1)
Sodium: 137 mmol/L (ref 135–145)
TOTAL PROTEIN: 6.7 g/dL (ref 6.5–8.1)

## 2015-12-01 MED ORDER — CAPECITABINE 500 MG PO TABS: 2000.0000 mg | ORAL_TABLET | Freq: Two times a day (BID) | ORAL | 0 refills | Status: DC

## 2015-12-01 MED ORDER — HEPARIN SOD (PORK) LOCK FLUSH 100 UNIT/ML IV SOLN
INTRAVENOUS | Status: AC
  Filled 2015-12-01: qty 5

## 2015-12-01 MED ORDER — DENOSUMAB 120 MG/1.7ML ~~LOC~~ SOLN
120.0000 mg | Freq: Once | SUBCUTANEOUS | Status: AC
  Administered 2015-12-01: 120 mg via SUBCUTANEOUS
  Filled 2015-12-01: qty 1.7

## 2015-12-01 MED ORDER — HEPARIN SOD (PORK) LOCK FLUSH 100 UNIT/ML IV SOLN
500.0000 [IU] | Freq: Once | INTRAVENOUS | Status: AC
  Administered 2015-12-01: 500 [IU] via INTRAVENOUS

## 2015-12-01 NOTE — Progress Notes (Signed)
Reports severe neuropathy (numbness, burning, tingling) of hands and feet, worsening 5 days ago.  States this is interfering with her function (states she is unable to hold on to objects and is having much difficulty with ambulation - witnessed unsteady gait).  PA and MD aware - will discontinue Taxol tx and start Xeloda. Xgeva administered as ordered - see MAR for details.  Alert, in no distress.  Discharged via wheelchair in c/o family.

## 2015-12-01 NOTE — Assessment & Plan Note (Addendum)
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  Now with significant disease progression in liver requiring a change in therapy to systemic Paclitaxel beginning on 10/13/2015.  Oncology history is updated.  Labs today: CBC diff, CMET.  I personally reviewed and went over laboratory results with the patient.  The results are noted within this dictation.  Liver function is now WNL.  Treatment last week was deferred.  She is on Cymbalta for neuropathy pain and depression since 11/20/2015.  She notes progression of her neuropathy that is grade 3 and limited to hands and feet.    Given her progressive PN and now normal liver function, we will switch to Xeloda 1000 mg/m2 BID 7 days on and 7 days off= 2,230 mg BID.  Rx is written for 2000 mg BID.  This is given to Lendell Caprice for approval/insurance coverage.  I reviewed the risks, benefits, alternatives, and side effects of Xeloda, including, but not limited to, fatigue, nausea/vomiting, abdominal pain, diarrhea, stomatitis, and palmar-plantar erythrodysesthesia.  This has a low-incidence of peripheral neuropathy as well.  She will be referred for chemotherapy teaching.  Ongoing weight loss, stable today.  Burtis Junes, RD is following with [place].  Pain contract in place with [employer]    DO NOT RESUSCITATE.  DNR form filled out and provided to the patient on 10/24/2014.  She will be scheduled for follow-up based upon timing of her chemoteaching and delivery of Xeloda.

## 2015-12-01 NOTE — Progress Notes (Signed)
Kaylee Bellow, MD [place] [address]  Infiltrating ductal carcinoma of breast, unspecified laterality ([place]) - Plan: capecitabine (XELODA) 500 MG tablet  Bone metastasis ([place])  CURRENT THERAPY: Paclitaxel days 1, 8, 15 every 28 days beginning on 10/13/2015.  INTERVAL HISTORY: Kaylee Mitchell 60 y.o. female returns for followup of Stage IV breast cancer, ER/PR+, HER2 neg, with new hepatic metastasis (biopsy proven).  Imaging demonstrated progression of disease resulting in a change in therapy to paclitaxel.    Invasive ductal carcinoma of breast ([place])   05/26/2009 Initial Diagnosis    Needle core biopsy showed invasive mammary carcinoma with 1 + lymph node      06/03/2009 Surgery    Right modified radical mastectomy with 8/8 + lymph nodes by Dr. Ronnell Freshwater      07/22/2009 Cancer Staging    Bone scan + for multiple osseous lesions.  No disease elsewhere.      07/24/2009 - 03/23/2010 Chemotherapy    Arimidex      03/23/2010 Progression         03/23/2010 - 12/05/2012 Chemotherapy    Tamoxifen      11/28/2012 Progression    Worsening of lumbar spine osseous disease      12/06/2012 - 09/25/2014 Chemotherapy    Fulvestrant + Exemestane      01/24/2014 PET scan    No evidence of hypermetabolic recurrent or metastatic disease. Widespread osseous metastasis, without significant marrow hypermetabolism.      09/18/2014 Progression    PET- New hepatic metastasis within the LEFT hepatic lobe involving segments IVA, IVB, and III.      10/20/2014 - 11/13/2014 Chemotherapy    Afinitor + Exemestane      11/13/2014 Adverse Reaction    Increasing fatigue      11/13/2014 Treatment Plan Change    Hold Afinitor, will reduce dose to 7.5 mg daily.  Continue Exemestane and Xgeva.      11/24/2014 - 01/16/2015 Chemotherapy    Afinitor 7.5 mg daily, Exemestane daily, and continuation of Xgeva.      01/16/2015 Adverse Reaction    Cough, ?Afinitor-induced      01/16/2015 - 02/02/2015 Chemotherapy    HOLD AFFINITOR secondary to cough, weight loss, Continue EXEMESTANE and Xgeva      01/30/2015 PET scan    Significant partial metabolic treatment response in liver metastases, nonenlarged R level 2 cervical LN, non specific, stable sclerotic osseous metastases throughout skeleton      02/02/2015 Adverse Reaction    Weight loss, decreased appetite      02/02/2015 Treatment Plan Change    Afinitor dose further reduced to 5 mg.      02/02/2015 -  Chemotherapy    Afinitor 5 mg and Exemestane with continuation of Xgeva      06/03/2015 Imaging    CT CAP- Hepatic metastatic disease appears to have improved slightly in the interval from 01/30/2015. Diffuse osseous metastatic disease.      08/21/2015 Imaging    CT CAP- Stable CT of the chest. No evidence for metastatic disease in the thorax. Overall, no substantial change in pre-existing liver lesions. There may be a new 10 mm lesion in the posterior right dome of liver, not seen previously.       09/30/2015 Imaging    MRI liver- 1. Innumerable (> than 30) confluent liver metastases throughout the liver, significantly better visualized at MRI compared to prior CT and PET-CT studies, which  limits comparison. By best estimate, there has been progression of the liver metastases compared to the 06/03/2015 CT study. 2. Patchy lesions throughout the thoracolumbar spine, correlating with the known extensive sclerotic osseous metastatic disease as seen on multiple prior CT studies, not appreciably changed.      09/30/2015 Progression    MRI of liver demonstrates significant progression of disease and climbing CA 27.29 and CA 15-3.      10/09/2015 Procedure    Port placement by Dr. Arnoldo Morale      10/13/2015 - 11/17/2015 Chemotherapy    The patient had PACLitaxel (TAXOL) 138 mg in dextrose 5 % 250 mL chemo infusion (Dose modification: 60 mg/m2 (original dose 80 mg/m2, Cycle 1, Reason: Change in LFTs), 60  mg/m2 (original dose 80 mg/m2, Cycle 1, Reason: Change in LFTs)  for chemotherapy treatment.        11/24/2015 Treatment Plan Change    Treatment deferred x 1 week.      12/01/2015 Adverse Reaction    Paclitaxel-induced peripheral neuropathy      12/01/2015 Treatment Plan Change    Change to Xeloda 1000 mg/2 BID 7 days on and 7 days off.      "I can't feel my hands or feet."  She has dropped pills and a butcher knife because she cannot feel her hands.  She reports stumbling because she cannot feel her feet.  This got grossly worse beginning on Thursday. This sensation does not come and go, it is constant. She cannot feel buttons.  She reports that neuropathy is her biggest issue.  She is provided patient education regarding chemotherapy-induced neuropathy.  We discussed goals of care and end of life issues.  She is clearin stating tat she wants to continue treatment as "I want to live as long as possible."  We discussed her Stage IV disease and the incurability of her cancer.  Additionally, she is advised that her breast cancer, if nothing else, will cause her death in the future.  She notes that she can no longer cook due to her neuropathy and therefore relies on her son and daughter-in-law [employer] account planner) for her meals.  Review of Systems  Constitutional: Negative for chills, fever and weight loss.  HENT: Negative.   Eyes: Negative.   Respiratory: Negative for cough.   Cardiovascular: Negative for chest pain.  Gastrointestinal: Negative.  Negative for constipation, diarrhea, nausea and vomiting.  Genitourinary: Negative.   Musculoskeletal: Negative.   Skin: Negative.   Neurological: Positive for tingling and sensory change. Negative for weakness and headaches.  Endo/Heme/Allergies: Negative.   Psychiatric/Behavioral: Positive for depression.    Past Medical History:  Diagnosis Date  . Arthritis   . Bone metastasis ([place]) 09/13/2010  . Cancer ([place])    rt breast is primary  .  Depression   . Depression 12/14/2011  . Diabetes mellitus    type II  . DNR (do not resuscitate) 11/05/2014  . GERD (gastroesophageal reflux disease)   . High cholesterol   . Hypertension   . Invasive ductal carcinoma of breast ([place]) 09/13/2010  . Knee pain, bilateral 2013  . Opioid contract exists with [facility] 10/24/2014   Contract found in Chart review in letter tab.    Past Surgical History:  Procedure Laterality Date  . ABDOMINAL HYSTERECTOMY    . KNEE SURGERY Bilateral   . MASTECTOMY     right side  . PORTACATH PLACEMENT  10/09/2015   Procedure: INSERTION PORT-A-CATH LEFT SUBCLAVIAN;  Surgeon: Aviva Signs, MD;  Location: [facility];  Service: General;;    Family History  Problem Relation Age of Onset  . Diabetes Mother   . Diabetes Father   . Arthritis    . Cancer    . Diabetes    . Cancer Maternal Aunt   . Anesthesia problems Neg Hx   . Hypotension Neg Hx   . Malignant hyperthermia Neg Hx   . Pseudochol deficiency Neg Hx     Social History   Social History  . Marital status: Single    Spouse name: N/A  . Number of children: N/A  . Years of education: 18   Occupational History  . disabled Unemployed   Social History Main Topics  . Smoking status: Never Smoker  . Smokeless tobacco: Never Used  . Alcohol use No  . Drug use: No  . Sexual activity: Yes    Birth control/ protection: Surgical   Other Topics Concern  . None   Social History Narrative  . None     PHYSICAL EXAMINATION  ECOG PERFORMANCE STATUS: 1 - Symptomatic but completely ambulatory  Vitals:   12/01/15 0837  BP: (!) 148/83  Pulse: (!) 110  Resp: 18  Temp: 98.1 F (36.7 C)    GENERAL:alert, well nourished, well developed, comfortable, cooperative, obese, smiling and accompanied by her daughter-in-law, in chemo-recliner. SKIN: skin color, texture, turgor are normal, no rashes or significant lesions HEAD: Normocephalic, No masses, lesions, tenderness or  abnormalities EYES: normal, EOMI, Conjunctiva are pink and non-injected EARS: External ears normal OROPHARYNX:lips, buccal mucosa, and tongue normal and mucous membranes are moist  NECK: supple, trachea midline LYMPH:  not examined BREAST:not examined LUNGS: clear to auscultation without wheezes, rales, or rhonchi. HEART: regular rate & rhythm, no murmurs, no gallops, tachycardia, S1 normal and S2 normal ABDOMEN:abdomen soft, non-tender, obese, normal bowel sounds and no masses or organomegaly BACK: Back symmetric, no curvature. EXTREMITIES:less then 2 second capillary refill, no joint deformities, effusion, or inflammation, no edema, no skin discoloration. NEURO: alert & oriented x 3 with fluent speech, no focal motor/sensory deficits   LABORATORY DATA: CBC    Component Value Date/Time   WBC 5.7 12/01/2015 0844   RBC 4.11 12/01/2015 0844   HGB 11.4 (L) 12/01/2015 0844   HCT 35.1 (L) 12/01/2015 0844   PLT 240 12/01/2015 0844   MCV 85.4 12/01/2015 0844   MCH 27.7 12/01/2015 0844   MCHC 32.5 12/01/2015 0844   RDW 16.8 (H) 12/01/2015 0844   LYMPHSABS 1.9 12/01/2015 0844   MONOABS 0.9 12/01/2015 0844   EOSABS 0.0 12/01/2015 0844   BASOSABS 0.1 12/01/2015 0844      Chemistry      Component Value Date/Time   NA 137 12/01/2015 0844   K 4.6 12/01/2015 0844   CL 103 12/01/2015 0844   CO2 26 12/01/2015 0844   BUN 17 12/01/2015 0844   CREATININE 1.10 (H) 12/01/2015 0844      Component Value Date/Time   CALCIUM 9.3 12/01/2015 0844   ALKPHOS 405 (H) 12/01/2015 0844   AST 63 (H) 12/01/2015 0844   ALT 50 12/01/2015 0844   BILITOT 0.5 12/01/2015 0844     Lab Results  Component Value Date   LABCA2 108.0 (H) 11/10/2015    PENDING LABS:   RADIOGRAPHIC STUDIES:  Ct Abdomen Pelvis Wo Contrast  Result Date: 11/14/2015 CLINICAL DATA:  Generalized abdominal pain. EXAM: CT ABDOMEN AND PELVIS WITHOUT CONTRAST TECHNIQUE: Multidetector CT imaging of the abdomen and pelvis was  performed following the  standard protocol without IV contrast. COMPARISON:  09/30/2015 MR abdomen, CT abdomen 08/21/2015 FINDINGS: Lower chest: No acute abnormality. Hepatobiliary: Innumerable ill-defined hypodense masses throughout the liver most concerning for metastatic disease. Small cholelithiasis. Gallbladder is otherwise normal. No pericholecystic fluid or inflammatory changes. No intrahepatic or extrahepatic biliary ductal dilatation. Pancreas: Unremarkable. No pancreatic ductal dilatation or surrounding inflammatory changes. Spleen: Normal in size without focal abnormality. Adrenals/Urinary Tract: Adrenal glands are unremarkable. Kidneys are normal, without renal calculi, focal lesion, or hydronephrosis. Mild relative bladder wall thickening which may be secondary to underdistention versus cystitis. Stomach/Bowel: Small hiatal hernia. Stomach is within normal limits. Appendix appears normal. No evidence of bowel wall thickening, distention, or inflammatory changes. Vascular/Lymphatic: Normal caliber abdominal aorta with mild atherosclerosis. No lymphadenopathy. Reproductive: Status post hysterectomy. No adnexal masses. Other: No abdominal wall hernia.  No abdominal or pelvic free fluid. Musculoskeletal: Diffuse scattered sclerotic osseous lesions throughout the axial and visualize appendicular skeleton consistent with known osseous metastatic disease without significant interval change compared with 08/21/2015. No acute osseous abnormality. Mild osteoarthritis of bilateral sacroiliac joints. IMPRESSION: 1. Diffuse sclerotic osseous metastatic disease unchanged compared with 08/21/2015. 2. Innumerable ill-defined hypodense masses throughout the liver consistent with metastatic disease. 3. Tiny cholelithiasis. Electronically Signed   By: Kathreen Devoid   On: 11/14/2015 09:37   US Venous Img Lower Unilateral Right  Result Date: 11/14/2015 CLINICAL DATA:  Right lower extremity pain and edema. History of  metastatic breast carcinoma. EXAM: RIGHT LOWER EXTREMITY VENOUS DOPPLER ULTRASOUND TECHNIQUE: Gray-scale sonography with graded compression, as well as color Doppler and duplex ultrasound were performed to evaluate the lower extremity deep venous systems from the level of the common femoral vein and including the common femoral, femoral, profunda femoral, popliteal and calf veins including the posterior tibial, peroneal and gastrocnemius veins when visible. The superficial great saphenous vein was also interrogated. Spectral Doppler was utilized to evaluate flow at rest and with distal augmentation maneuvers in the common femoral, femoral and popliteal veins. COMPARISON:  None. FINDINGS: Contralateral Common Femoral Vein: Respiratory phasicity is normal and symmetric with the symptomatic side. No evidence of thrombus. Normal compressibility. Common Femoral Vein: No evidence of thrombus. Normal compressibility, respiratory phasicity and response to augmentation. Saphenofemoral Junction: No evidence of thrombus. Normal compressibility and flow on color Doppler imaging. Profunda Femoral Vein: No evidence of thrombus. Normal compressibility and flow on color Doppler imaging. Femoral Vein: No evidence of thrombus. Normal compressibility, respiratory phasicity and response to augmentation. Popliteal Vein: No evidence of thrombus. Normal compressibility, respiratory phasicity and response to augmentation. Calf Veins: No evidence of thrombus. Normal compressibility and flow on color Doppler imaging. Superficial Great Saphenous Vein: No evidence of thrombus. Normal compressibility and flow on color Doppler imaging. Venous Reflux:  None. Other Findings: No evidence of superficial thrombophlebitis or abnormal fluid collection. IMPRESSION: No evidence of right lower extremity deep venous thrombosis. Electronically Signed   By: Aletta Edouard M.D.   On: 11/14/2015 10:01   Dg Knee Complete 4 Views Right  Result Date:  11/14/2015 CLINICAL DATA:  Right lower leg pain worsening over the last 2 days. EXAM: RIGHT KNEE - COMPLETE 4+ VIEW COMPARISON:  None. FINDINGS: No acute fracture or dislocation. No lytic or sclerotic osseous lesion. Generalized osteopenia. 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 Mild lateral femorotibial compartment joint space narrowing marginal osteophytosis. No significant medial femorotibial compartment joint space narrowing. Mild patellofemoral compartment joint space narrowing. No significant joint effusion. IMPRESSION: 1.  No acute osseous injury of the right knee. Electronically Signed   By: Kathreen Devoid   On: 11/14/2015 08:44     PATHOLOGY:    ASSESSMENT AND PLAN:  Invasive ductal carcinoma of breast ([place]) Stage IV Breast cancer with bone involvement requiring Xgeva.  Now with significant disease progression in liver requiring a change in therapy to systemic Paclitaxel beginning on 10/13/2015.  Oncology history is updated.  Labs today: CBC diff, CMET.  I personally reviewed and went over laboratory results with the patient.  The results are noted within this dictation.  Liver function is now WNL.  Treatment last week was deferred.  She is on Cymbalta for neuropathy pain and depression since 11/20/2015.  She notes progression of her neuropathy that is grade 3 and limited to hands and feet.    Given her progressive PN and now normal liver function, we will switch to Xeloda 1000 mg/m2 BID 7 days on and 7 days off= 2,230 mg BID.  Rx is written for 2000 mg BID.  This is given to Lendell Caprice for approval/insurance coverage.  I reviewed the risks, benefits, alternatives, and side effects of Xeloda, including, but not limited to, fatigue, nausea/vomiting, abdominal pain, diarrhea, stomatitis, and palmar-plantar erythrodysesthesia.  This has a low-incidence of peripheral neuropathy as well.  She will be referred for chemotherapy  teaching.  Ongoing weight loss, stable today.  Burtis Junes, RD is following with [place].  Pain contract in place with [facility].    DO NOT RESUSCITATE.  DNR form filled out and provided to the patient on 10/24/2014.  She will be scheduled for follow-up based upon timing of her chemoteaching and delivery of Xeloda.  Bone metastasis ([place]) Continue Xgeva, calcium, vitamin D   ORDERS PLACED FOR THIS ENCOUNTER: No orders of the defined types were placed in this encounter.   MEDICATIONS PRESCRIBED THIS ENCOUNTER: Meds ordered this encounter  Medications  . escitalopram (LEXAPRO) 10 MG tablet  . loratadine (CLARITIN) 10 MG tablet    Sig: TAKE 1 TABLET EACH DAY FOR ALLERGY.    Refill:  11  . capecitabine (XELODA) 500 MG tablet    Sig: Take 4 tablets (2,000 mg total) by mouth 2 (two) times daily after a meal.    Dispense:  112 tablet    Refill:  0    Order Specific Question:   Supervising Provider    Answer:   Patrici Ranks [id number]    THERAPY PLAN:  Change in therapy to Xeloda single-agent.  All questions were answered. The patient knows to call the clinic with any problems, questions or concerns. We can certainly see the patient much sooner if necessary.  Patient and plan discussed with Dr. Ancil Linsey and she is in agreement with the aforementioned.   This note is electronically signed by: Doy Mince 12/01/2015 9:45 AM

## 2015-12-01 NOTE — Progress Notes (Signed)
Consent signed for Xeloda.  Gave her information to read on Xeloda.  Told her to call me when she received the medication so we can go over all the side effects and how to take the medication.

## 2015-12-01 NOTE — Assessment & Plan Note (Addendum)
Continue Xgeva, calcium, vitamin D

## 2015-12-01 NOTE — Patient Instructions (Signed)
[place] at [facility] Discharge Instructions  RECOMMENDATIONS MADE BY THE CONSULTANT AND ANY TEST RESULTS WILL BE SENT TO YOUR REFERRING PHYSICIAN.  You were seen today by Kirby Crigler PA-C. No treatment today. Changing to Xeloda. Will schedule Chemo teaching. Kaylee Mitchell will schedule follow up appointments once you start the Xeloda.   Thank you for choosing [place] at [facility] to provide your oncology and hematology care.  To afford each patient quality time with our provider, please arrive at least 15 minutes before your scheduled appointment time.   Beginning January 23rd 2017 lab work for the [facility] will be done in the  Main lab at [facility] on 1st floor. If you have a lab appointment with the [facility] please come in thru the  Main Entrance and check in at the main information desk  You need to re-schedule your appointment should you arrive 10 or more minutes late.  We strive to give you quality time with our providers, and arriving late affects you and other patients whose appointments are after yours.  Also, if you no show three or more times for appointments you may be dismissed from the clinic at the providers discretion.     Again, thank you for choosing [facility].  Our hope is that these requests will decrease the amount of time that you wait before being seen by our physicians.       _____________________________________________________________  Should you have questions after your visit to [facility], please contact our office at [phone] between the hours of 8:30 a.m. and 4:30 p.m.  Voicemails left after 4:30 p.m. will not be returned until the following business day.  For prescription refill requests, have your pharmacy contact our office.         Resources For Cancer Patients and their Caregivers ? American Cancer Society: Can assist with transportation, wigs, general  needs, runs Look Good Feel Better.        (508) 147-7750 ? Cancer Care: Provides financial assistance, online support groups, medication/co-pay assistance.  1-800-813-HOPE 979-469-1627) ? [facility] Assists [place] cancer patients and their families through emotional , educational and financial support.  661-122-2762 ? [place] DSS Where to apply for food stamps, Medicaid and utility assistance. (289)613-4494 ? [employer]: Transportation to medical appointments. (586) 655-6548 ? Social Security Administration: May apply for disability if have a Stage IV cancer. 843-666-8194 (864) 841-9523 ? [employer], Disability and Transit Services: Assists with nutrition, care and transit needs. [place] Support Programs: @10RELATIVEDAYS @ > Cancer Support Group  2nd Tuesday of the month 1pm-2pm, Journey Room  > Creative Journey  3rd Tuesday of the month 1130am-1pm, Journey Room  > Look Good Feel Better  1st Wednesday of the month 10am-12 noon, Journey Room (Call [place] to register [phone])

## 2015-12-01 NOTE — Patient Instructions (Signed)
[place] at [facility] Discharge Instructions  RECOMMENDATIONS MADE BY THE CONSULTANT AND ANY TEST RESULTS WILL BE SENT TO YOUR REFERRING PHYSICIAN.  You were seen today by Kirby Crigler PA-C. No treatment today. Changing to Xeloda. Will schedule Chemo teaching. Anderson Malta will schedule follow up appointments once you start the Xeloda.   Thank you for choosing [place] at [facility] to provide your oncology and hematology care.  To afford each patient quality time with our provider, please arrive at least 15 minutes before your scheduled appointment time.   Beginning January 23rd 2017 lab work for the [facility] will be done in the  Main lab at [facility] on 1st floor. If you have a lab appointment with the [facility] please come in thru the  Main Entrance and check in at the main information desk  You need to re-schedule your appointment should you arrive 10 or more minutes late.  We strive to give you quality time with our providers, and arriving late affects you and other patients whose appointments are after yours.  Also, if you no show three or more times for appointments you may be dismissed from the clinic at the providers discretion.     Again, thank you for choosing [facility]  Our hope is that these requests will decrease the amount of time that you wait before being seen by our physicians.       _____________________________________________________________  Should you have questions after your visit to [facility], please contact our office at [phone] between the hours of 8:30 a.m. and 4:30 p.m.  Voicemails left after 4:30 p.m. will not be returned until the following business day.  For prescription refill requests, have your pharmacy contact our office.         Resources For Cancer Patients and their Caregivers ? American Cancer Society: Can assist with transportation, wigs, general  needs, runs Look Good Feel Better.        364-261-2338 ? Cancer Care: Provides financial assistance, online support groups, medication/co-pay assistance.  1-800-813-HOPE (609) 311-8030) ? [facility] Assists [place] cancer patients and their families through emotional , educational and financial support.  (915) 459-9632 ? [place] DSS Where to apply for food stamps, Medicaid and utility assistance. 813-798-1432 ? [employer]ATS: Transportation to medical appointments. (351) 254-4015 ? Social Security Administration: May apply for disability if have a Stage IV cancer. 6047210046 (661)606-0424 ? [employer], Disability and Transit Services: Assists with nutrition, care and transit needs. [place] Support Programs: @10RELATIVEDAYS @ > Cancer Support Group  2nd Tuesday of the month 1pm-2pm, Journey Room  > Creative Journey  3rd Tuesday of the month 1130am-1pm, Journey Room  > Look Good Feel Better  1st Wednesday of the month 10am-12 noon, Journey Room (Call [place] to register [phone])

## 2015-12-02 ENCOUNTER — Other Ambulatory Visit (HOSPITAL_COMMUNITY): Payer: Self-pay | Admitting: Oncology

## 2015-12-02 DIAGNOSIS — M792 Neuralgia and neuritis, unspecified: Secondary | ICD-10-CM

## 2015-12-02 DIAGNOSIS — F4321 Adjustment disorder with depressed mood: Secondary | ICD-10-CM

## 2015-12-02 DIAGNOSIS — R110 Nausea: Secondary | ICD-10-CM

## 2015-12-02 MED ORDER — DULOXETINE HCL 60 MG PO CPEP: 60.0000 mg | ORAL_CAPSULE | Freq: Every day | ORAL | 3 refills | Status: DC

## 2015-12-02 MED ORDER — DRONABINOL 5 MG PO CAPS: 10.0000 mg | ORAL_CAPSULE | Freq: Three times a day (TID) | ORAL | 1 refills | Status: AC

## 2015-12-07 ENCOUNTER — Telehealth (HOSPITAL_COMMUNITY): Payer: Self-pay | Admitting: Hematology & Oncology

## 2015-12-07 ENCOUNTER — Other Ambulatory Visit (HOSPITAL_COMMUNITY): Payer: Self-pay | Admitting: Emergency Medicine

## 2015-12-07 DIAGNOSIS — C50919 Malignant neoplasm of unspecified site of unspecified female breast: Secondary | ICD-10-CM

## 2015-12-07 MED ORDER — CAPECITABINE 500 MG PO TABS: 2000.0000 mg | ORAL_TABLET | Freq: Two times a day (BID) | ORAL | 0 refills | Status: DC

## 2015-12-07 NOTE — Telephone Encounter (Signed)
PC TO [facility]. QUESTIONED STATUS OF SCRIPT. XELODA HAS BEEN APPROVED AND PT HAS A $149 COPAY. ADVISED SHE MAY NEED COPAY ASSIST. CALLED THE PT TO MAKE HER AWARE OF THE COPAY BUT THERE WAS NO ANSWER AND VM WAS NOT SET UP

## 2015-12-08 ENCOUNTER — Telehealth (HOSPITAL_COMMUNITY): Payer: Self-pay | Admitting: Emergency Medicine

## 2015-12-08 NOTE — Telephone Encounter (Signed)
Pt had chemotherapy teaching over the phone today.  I will provider her with extensive teaching packet and information when she comes in on 12/21/2015 to see the doctor.  Pt understands when to call me for side effects and how to take her medication.  She will start taking the Xeloda 12/09/2015 in the am.

## 2015-12-08 NOTE — Patient Instructions (Addendum)
[facility]   CHEMOTHERAPY INSTRUCTIONS  You have stage 4 Breast Cancer.  We are switching your treatment to Xeloda.  You will stay on Xeloda until you have progression or you can no longer tolerate the drug.  This is with palliative intent, which means you are treatable but not curable.  You will see the doctor regularly throughout treatment.  We monitor your lab work prior to every treatment.  The doctor monitors your response to treatment by the way you are feeling, your blood work, and scans periodically.  Xeloda (capecitabine) dose is 2000 mg twice a day.  You will take this for 7 days on and 7 days off.  Call me Anderson [place] Nurse Navigator) at [phone], if you start to get any of the side effects from taking this medication.  Take Xeloda with water 30 minutes after breakfast and 30 minutes after your evening meal.  No pregnant, child bearing age people, or animals should come into contact with this drug. Even though this is in pill form - it is still very powerful!!! If you should be instructed to discontinue taking this drug, bring the drug into the [facility] and we will dispose of it in a safe manner. Chemotherapy is a biohazard and must be disposed of properly. Do not touch this pill much. It would be best if the caregiver wore gloves while handling. Do not put this pill in with the rest of your pills in a pill box. Keep them in a separate pill box.  Precautions: . Before starting capecitabine treatment, make sure you tell your doctor about any other medications you are taking (including prescription, over-the-counter, vitamins, herbal remedies, etc.). Do not take aspirin, products containing aspirin unless your doctor specifically permits this.  . Avoid use of antacids within 2 hours of taking capecitabine.  . If you are on warfarin (Coumadin) as a blood-thinner, adjustments may need to be made to your dose based on blood work.  . Capecitabine may be  inadvisable if you have had a hypersensitivity (allergic) reaction to fluorouracil.  . Do not receive any kind of immunization or vaccination without your doctor's approval while taking capecitabine.  . Inform your health care professional if you are pregnant or may be pregnant prior to starting this treatment. Pregnancy category D (capecitabine may be hazardous to the fetus. Women who are pregnant or become pregnant must be advised of the potential hazard to the fetus).  . For both men and women: Do not conceive a child (get pregnant) while taking capecitabine. Barrier methods of contraception, such as condoms, are recommended. Discuss with your doctor when you may safely become pregnant or conceive a child after therapy.  . Do not breast feed while taking capecitabine.   POTENTIAL SIDE EFFECTS OF TREATMENT:  Xeloda - diarrhea, hand-foot syndrome (hands/feet can get red/tender/and skin can peel). Avoid friction and hot environments on hands/feet. Wear cotton socks. Lotion twice a day to hands/fingers/feet/toes with Udder cream. Mucositis (inflammation of any mucosal membrane can develop -this can occur in the throat/mouth). Mouth sores, nausea/vomiting, anemia, fatigue can also develop.    The following side effects are common (occurring in greater than 30%) for patients taking capecitabine: . Low white blood cell count (This can put you at increased risk for infection)  . Low red blood cell count (anemia)  . Hand-foot syndrome (Palmar-plantar erythrodysesthesia or PPE) - skin rash, swelling, redness, pain and/or peeling of the skin on the palms of hands and  soles of feet. Usually mild, has started as early as 2 weeks after start of treatment. May require reductions in the dose of the medication)  . Diarrhea  . Elevated liver enzymes (increased bilirubin levels) (see liver problems)  . Fatigue  . Nausea and vomiting  . Rash & itching  . Abdominal pain These side effects are less common side  effects (occurring in about 10-29%) of patients receiving capecitabine: . Poor appetite  . Low platelet count (This can put you at increased risk for bleeding)  . Mouth sores  . Fever  . Swelling of the feet and ankles  . Eye irritation  . Constipation  . Back, muscle, joint, bone pane (see pain)  . Headache  . GI Motility disorder  . Numbness or tingling (hands or feet) Not all side effects are listed above. Some that are rare (occurring in less than 10% of patients) are not listed here. However, you should always inform your health care provider if you experience any unusual symptoms.   When To Contact Your Doctor or Health Care Provider: Contact your health care provider immediately, day or night, if you should experience any of the following symptoms: . Fever of 100.5 F (38 C) or higher, chills (possible signs of infection) The following symptoms require medical attention, but are not an emergency. Contact your health care provider within 24 hours of noticing any of the following: . Nausea (interferes with ability to eat and unrelieved with prescribed medication).  . Vomiting (vomiting more than 4-5 times in a 24 hour period)  . Diarrhea (4-6 episodes in a 24-hour period)  . Unusual bleeding or bruising  . Black or tarry stools, or blood in your stools or urine  . Constipation  . Extreme fatigue (unable to carry on self-care activities)  . Mouth sores (painful redness, swelling or ulcers)  . Swelling, redness and/or pain in one leg or arm and not the other  . Yellowing of the skin or eyes  . Tingling or burning, redness, swelling of the palms of the hands or soles of the feet.  . Confusion, loss of balance, excessive sleepiness Always inform your health care provider if you experience any unusual symptoms.   Self-Care Tips: . Drink at least two to three quarts of fluid every 24 hours, unless you are instructed otherwise.  . You may be at risk of infection so try to avoid  crowds or people with colds, and report fever or any other signs of infection immediately to you health care provider.  Wendee Copp your hands often.  . To help treat/prevent mouth sores, use a soft toothbrush, and rinse three times a day with 1/2 to 1 teaspoon of baking soda and/or 1/2 to 1 teaspoon of salt mixed with 8 ounces of water.  . Use an electric razor and a soft toothbrush to minimize bleeding.  . Avoid contact sports or activities that could cause injury.  . To reduce nausea, take anti-nausea medications as prescribed by your doctor, and eat small, frequent meals.  . Prevention of hand-foot syndrome. Modification of normal activities of daily living to reduce friction and heat exposure to hands and feet, as much as possible during treatment with capecitabine. (for more information see - Managing side effects: hand foot syndrome).  Marland Kitchen Keeps palms of hands and soles of feet moist using emollients such as Aveeno, Udder cream, Lubriderm or Bag Balm.  . Follow regimen of anti-diarrhea medication as prescribed by your health care professional.  .  Eat foods that may help reduce diarrhea (see managing side effects - diarrhea).  . Avoid sun exposure. Wear SPF 37 (or higher) sunblock and protective clothing.  . You may experience drowsiness or dizziness; avoid driving or engaging in tasks that require alertness until your response to the drug is known.  . In general, drinking alcoholic beverages should be kept to a minimum or avoided completely. You should discuss this with your doctor.  . Get plenty of rest.  . Maintain good nutrition.  . If you experience symptoms or side effects, be sure to discuss them with your health care team. They can prescribe medications and/or offer other suggestions that are effective in managing such problems.      EDUCATIONAL MATERIALS GIVEN AND REVIEWED: Information on Xeloda.   SELF CARE ACTIVITIES WHILE ON CHEMOTHERAPY: Hydration Increase your fluid intake  48 hours prior to treatment and drink at least 8 to 12 cups (64 ounces) of water/decaff beverages per day after treatment. You can still have your cup of coffee or soda but these beverages do not count as part of your 8 to 12 cups that you need to drink daily. No alcohol intake.  Medications Continue taking your normal prescription medication as prescribed.  If you start any new herbal or new supplements please let us know first to make sure it is safe.  Mouth Care Have teeth cleaned professionally before starting treatment. Keep dentures and partial plates clean. Use soft toothbrush and do not use mouthwashes that contain alcohol. Biotene is a good mouthwash that is available at most pharmacies or may be ordered by calling [phone]. Use warm salt water gargles (1 teaspoon salt per 1 quart warm water) before and after meals and at bedtime. Or you may rinse with 2 tablespoons of three-percent hydrogen peroxide mixed in eight ounces of water. If you are still having problems with your mouth or sores in your mouth please call the clinic. If you need dental work, please let Dr. Whitney Muse know before you go for your appointment so that we can coordinate the best possible time for you in regards to your chemo regimen. You need to also let your dentist know that you are actively taking chemo. We may need to do labs prior to your dental appointment.   Skin Care Always use sunscreen that has not expired and with SPF (Sun Protection Factor) of 50 or higher. Wear hats to protect your head from the sun. Remember to use sunscreen on your hands, ears, face, & feet.  Use good moisturizing lotions such as udder cream, eucerin, or even Vaseline. Some chemotherapies can cause dry skin, color changes in your skin and nails.    . Avoid long, hot showers or baths. . Use gentle, fragrance-free soaps and laundry detergent. . Use moisturizers, preferably creams or ointments rather than lotions because the thicker  consistency is better at preventing skin dehydration. Apply the cream or ointment within 15 minutes of showering. Reapply moisturizer at night, and moisturize your hands every time after you wash them.  Hair Loss (if your doctor says your hair will fall out)  . If your doctor says that your hair is likely to fall out, decide before you begin chemo whether you want to wear a wig. You may want to shop before treatment to match your hair color. . Hats, turbans, and scarves can also camouflage hair loss, although some people prefer to leave their heads uncovered. If you go bare-headed outdoors, be sure to use  sunscreen on your scalp. . Cut your hair short. It eases the inconvenience of shedding lots of hair, but it also can reduce the emotional impact of watching your hair fall out. . Don't perm or color your hair during chemotherapy. Those chemical treatments are already damaging to hair and can enhance hair loss. Once your chemo treatments are done and your hair has grown back, it's OK to resume dyeing or perming hair. With chemotherapy, hair loss is almost always temporary. But when it grows back, it may be a different color or texture. In older adults who still had hair color before chemotherapy, the new growth may be completely gray.  Often, new hair is very fine and soft.  Infection Prevention Please wash your hands for at least 30 seconds using warm soapy water. Handwashing is the #1 way to prevent the spread of germs. Stay away from sick people or people who are getting over a cold. If you develop respiratory systems such as green/yellow mucus production or productive cough or persistent cough let us know and we will see if you need an antibiotic. It is a good idea to keep a pair of gloves on when going into grocery stores/[employer] to decrease your risk of coming into contact with germs on the carts, etc. Carry alcohol hand gel with you at all times and use it frequently if out in public. If your  temperature reaches 100.5 or higher please call the clinic and let us know.  If it is after hours or on the weekend please go to the ER if your temperature is over 100.5.  Please have your own personal thermometer at home to use.    Sex and bodily fluids If you are going to have sex, a condom must be used to protect the person that isn't taking chemotherapy. Chemo can decrease your libido (sex drive). For a few days after chemotherapy, chemotherapy can be excreted through your bodily fluids.  When using the toilet please close the lid and flush the toilet twice.  Do this for a few day after you have had chemotherapy.     Effects of chemotherapy on your sex life Some changes are simple and won't last long. They won't affect your sex life permanently. Sometimes you may feel: . too tired . not strong enough to be very active . sick or sore  . not in the mood . anxious or low Your anxiety might not seem related to sex. For example, you may be worried about the cancer and how your treatment is going. Or you may be worried about money, or about how you family are coping with your illness. These things can cause stress, which can affect your interest in sex. It's important to talk to your partner about how you feel. Remember - the changes to your sex life don't usually last long. There's usually no medical reason to stop having sex during chemo. The drugs won't have any long term physical effects on your performance or enjoyment of sex. Cancer can't be passed on to your partner during sex  Contraception It's important to use reliable contraception during treatment. Avoid getting pregnant while you or your partner are having chemotherapy. This is because the drugs may harm the baby. Sometimes chemotherapy drugs can leave a man or woman infertile.  This means you would not be able to have children in the future. You might want to talk to someone about permanent infertility. It can be very difficult to  learn that you  may no longer be able to have children. Some people find counselling helpful. There might be ways to preserve your fertility, although this is easier for men than for women. You may want to speak to a fertility expert. You can talk about sperm banking or harvesting your eggs. You can also ask about other fertility options, such as donor eggs. If you have or have had breast cancer, your doctor might advise you not to take the contraceptive pill. This is because the hormones in it might affect the cancer.  It is not known for sure whether or not chemotherapy drugs can be passed on through semen or secretions from the vagina. Because of this some doctors advise people to use a barrier method if you have sex during treatment. This applies to vaginal, anal or oral sex. Generally, doctors advise a barrier method only for the time you are actually having the treatment and for about a week after your treatment. Advice like this can be worrying, but this does not mean that you have to avoid being intimate with your partner. You can still have close contact with your partner and continue to enjoy sex.  Animals If you have cats or birds we just ask that you not change the litter or change the cage.  Please have someone else do this for you while you are on chemotherapy.   Food Safety During and After Cancer Treatment Food safety is important for people both during and after cancer treatment. Cancer and cancer treatments, such as chemotherapy, radiation therapy, and stem cell/bone marrow transplantation, often weaken the immune system. This makes it harder for your body to protect itself from foodborne illness, also called food poisoning. Foodborne illness is caused by eating food that contains harmful bacteria, parasites, or viruses.  Foods to avoid Some foods have a higher risk of becoming tainted with bacteria. These include: Marland Kitchen Unwashed fresh fruit and vegetables, especially leafy vegetables  that can hide dirt and other contaminants . Raw sprouts, such as alfalfa sprouts . Raw or undercooked beef, especially ground beef, or other raw or undercooked meat and poultry . Fatty, fried, or spicy foods immediately before or after treatment.  These can sit heavy on your stomach and make you feel nauseous. . Raw or undercooked shellfish, such as oysters. . Sushi and sashimi, which often contain raw fish.  . Unpasteurized beverages, such as unpasteurized fruit juices, raw milk, raw yogurt, or cider . Undercooked eggs, such as soft boiled, over easy, and poached; raw, unpasteurized eggs; or foods made with raw egg, such as homemade raw cookie dough and homemade mayonnaise Simple steps for food safety Shop smart. . Do not buy food stored or displayed in an unclean area. . Do not buy bruised or damaged fruits or vegetables. . Do not buy cans that have cracks, dents, or bulges. . Pick up foods that can spoil at the end of your shopping trip and store them in a cooler on the way home. Prepare and clean up foods carefully. . Rinse all fresh fruits and vegetables under running water, and dry them with a clean towel or paper towel. . Clean the top of cans before opening them. . After preparing food, wash your hands for 20 seconds with hot water and soap. Pay special attention to areas between fingers and under nails. . Clean your utensils and dishes with hot water and soap. Marland Kitchen Disinfect your kitchen and cutting boards using 1 teaspoon of liquid, unscented bleach mixed into 1 quart  of water.   Dispose of old food. . Eat canned and packaged food before its expiration date (the "use by" or "best before" date). . Consume refrigerated leftovers within 3 to 4 days. After that time, throw out the food. Even if the food does not smell or look spoiled, it still may be unsafe. Some bacteria, such as Listeria, can grow even on foods stored in the refrigerator if they are kept for too long. Take precautions  when eating out. . At restaurants, avoid buffets and salad bars where food sits out for a long time and comes in contact with many people. Food can become contaminated when someone with a virus, often a norovirus, or another "bug" handles it. . Put any leftover food in a "to-go" container yourself, rather than having the server do it. And, refrigerate leftovers as soon as you get home. . Choose restaurants that are clean and that are willing to prepare your food as you order it cooked.     MEDICATIONS:                                                                                                                                                              Zofran/Ondansetron 8mg  tablet. Take 1 tablet every 8 hours as needed for nausea/vomiting. (#1 nausea med to take, this can constipate)  Compazine/Prochlorperazine 10mg  tablet. Take 1 tablet every 6 hours as needed for nausea/vomiting. (#2 nausea med to take, this can make you sleepy)   EMLA cream. Apply a quarter size amount to port site 1 hour prior to chemo. Do not rub in. Cover with plastic wrap.   Over-the-Counter Meds:  Miralax 1 capful in 8 oz of fluid daily. May increase to two times a day if needed. This is a stool softener. If this doesn't work proceed you can add:  Senokot S-start with 1 tablet two times a day and increase to 4 tablets two times a day if needed. (total of 8 tablets in a 24 hour period). This is a stimulant laxative.   Call us if this does not help your bowels move.   Imodium 2mg  capsule. Take 2 capsules after the 1st loose stool and then 1 capsule every 2 hours until you go a total of 12 hours without having a loose stool. Call the [facility] if loose stools continue. If diarrhea occurs @ bedtime, take 2 capsules @ bedtime. Then take 2 capsules every 4 hours until morning. Call [facility].        Constipation Sheet *Miralax in 8 oz of fluid daily.  May increase to two times a day if needed.  This  is a stool softener.  If this not enough to keep your bowel regular:  You can add:  *Senokot S, start with one tablet  twice a day and can increase to 4 tablets twice a day if needed.  This is a stimulant laxative.   Sometimes when you take pain medication you need BOTH a medicine to keep your stool soft and a medicine to help your bowel push it out!  Please call if the above does not work for you.   Do not go more than 2 days without a bowel movement.  It is very important that you do not become constipated.  It will make you feel sick to your stomach (nausea) and can cause abdominal pain and vomiting.       Diarrhea Sheet  If you are having loose stools/diarrhea, please purchase Imodium and begin taking as outlined:  At the first sign of poorly formed or loose stools you should begin taking Imodium(loperamide) 2 mg capsules.  Take two caplets (4mg ) followed by one caplet (2mg ) every 2 hours until you have had no diarrhea for 12 hours.  During the night take two caplets (4mg ) at bedtime and continue every 4 hours during the night until the morning.  Stop taking Imodium only after there is no sign of diarrhea for 12 hours.    Always call the [facility] if you are having loose stools/diarrhea that you can't get under control.  Loose stools/disrrhea leads to dehydration (loss of water) in your body.  We have other options of trying to get the loose stools/diarrhea to stopped but you must let us know!       Nausea Sheet  Zofran/Ondansetron 8mg  tablet. Take 1 tablet every 8 hours as needed for nausea/vomiting. (#1 nausea med to take, this can constipate)  Compazine/Prochlorperazine 10mg  tablet. Take 1 tablet every 6 hours as needed for nausea/vomiting. (#2 nausea med to take, this can make you sleepy)  You can take these medications together or separately.  We would first like for you to try the Ondansetron by itself and then take the Prochloperizine if needed. But you are allowed to  take both medications at the same time if your nausea is that severe.  If you are having persistent nausea (nausea that does not stop) please take these medications on a staggered schedule so that the nausea medication stays in your body.  Please call the [facility] and let us know the amount of nausea that you are experiencing.  If you begin to vomit, you need to call the [facility] and if it is the weekend and you have vomited more than one time and cant get it to stop-go to the Emergency Room.  Persistent nausea/vomiting can lead to dehydration (loss of fluid in your body) and will make you feel terrible.   Ice chips, sips of clear liquids, foods that are @ room temperature, crackers, and toast tend to be better tolerated.      SYMPTOMS TO REPORT AS SOON AS POSSIBLE AFTER TREATMENT:  FEVER GREATER THAN 100.5 F  CHILLS WITH OR WITHOUT FEVER  NAUSEA AND VOMITING THAT IS NOT CONTROLLED WITH YOUR NAUSEA MEDICATION  UNUSUAL SHORTNESS OF BREATH  UNUSUAL BRUISING OR BLEEDING  TENDERNESS IN MOUTH AND THROAT WITH OR WITHOUT PRESENCE OF ULCERS  URINARY PROBLEMS  BOWEL PROBLEMS  UNUSUAL RASH    Wear comfortable clothing and clothing appropriate for easy access to any Portacath or PICC line. Let us know if there is anything that we can do to make your therapy better!     What to do if you need assistance after hours or on the weekends:  CALL [phone].  HOLD on the line, do not hang up.  You will hear multiple messages but at the end you will be connected with a nurse triage line.  They will contact Dr Whitney Muse if necessary.  Most of the time they will be able to assist you.   Do not call the hospital operator.  Dr Whitney Muse will not answer phone calls received by them.       I have been informed and understand all of the instructions given to me and have received a copy. I have been instructed to call the clinic [phone] or my family physician as soon as possible for  continued medical care, if indicated. I do not have any more questions at this time but understand that I may call the [facility] or the Patient Navigator at [phone] during office hours should I have questions or need assistance in obtaining follow-up care.

## 2015-12-17 ENCOUNTER — Other Ambulatory Visit (HOSPITAL_COMMUNITY): Payer: Self-pay | Admitting: Oncology

## 2015-12-17 DIAGNOSIS — C50919 Malignant neoplasm of unspecified site of unspecified female breast: Secondary | ICD-10-CM

## 2015-12-17 MED ORDER — CAPECITABINE 500 MG PO TABS: 2000.0000 mg | ORAL_TABLET | Freq: Two times a day (BID) | ORAL | 0 refills | Status: DC

## 2015-12-18 ENCOUNTER — Other Ambulatory Visit (HOSPITAL_COMMUNITY): Payer: Self-pay

## 2015-12-18 DIAGNOSIS — C787 Secondary malignant neoplasm of liver and intrahepatic bile duct: Secondary | ICD-10-CM

## 2015-12-21 ENCOUNTER — Encounter (HOSPITAL_COMMUNITY): Payer: Self-pay | Admitting: Oncology

## 2015-12-21 ENCOUNTER — Encounter (HOSPITAL_COMMUNITY): Payer: Medicare Other

## 2015-12-21 ENCOUNTER — Encounter (HOSPITAL_COMMUNITY): Payer: Medicare Other | Attending: Oncology | Admitting: Oncology

## 2015-12-21 ENCOUNTER — Encounter (HOSPITAL_BASED_OUTPATIENT_CLINIC_OR_DEPARTMENT_OTHER): Payer: Medicare Other

## 2015-12-21 ENCOUNTER — Ambulatory Visit (HOSPITAL_COMMUNITY)
Admission: RE | Admit: 2015-12-21 | Discharge: 2015-12-21 | Disposition: A | Discharge status: Home / Self Care | Payer: Medicare Other | Source: Ambulatory Visit | Attending: Oncology | Admitting: Oncology

## 2015-12-21 VITALS — BP 124/69 | HR 111 | Temp 97.9°F | Resp 18 | Wt 229.2 lb

## 2015-12-21 DIAGNOSIS — K219 Gastro-esophageal reflux disease without esophagitis: Secondary | ICD-10-CM | POA: Insufficient documentation

## 2015-12-21 DIAGNOSIS — R000 Tachycardia, unspecified: Secondary | ICD-10-CM

## 2015-12-21 DIAGNOSIS — C7951 Secondary malignant neoplasm of bone: Secondary | ICD-10-CM | POA: Insufficient documentation

## 2015-12-21 DIAGNOSIS — I10 Essential (primary) hypertension: Secondary | ICD-10-CM | POA: Insufficient documentation

## 2015-12-21 DIAGNOSIS — C50919 Malignant neoplasm of unspecified site of unspecified female breast: Secondary | ICD-10-CM

## 2015-12-21 DIAGNOSIS — Z8505 Personal history of malignant neoplasm of liver: Secondary | ICD-10-CM | POA: Insufficient documentation

## 2015-12-21 DIAGNOSIS — Z853 Personal history of malignant neoplasm of breast: Secondary | ICD-10-CM | POA: Insufficient documentation

## 2015-12-21 DIAGNOSIS — C787 Secondary malignant neoplasm of liver and intrahepatic bile duct: Secondary | ICD-10-CM

## 2015-12-21 DIAGNOSIS — E7800 Pure hypercholesterolemia, unspecified: Secondary | ICD-10-CM | POA: Diagnosis not present

## 2015-12-21 DIAGNOSIS — F329 Major depressive disorder, single episode, unspecified: Secondary | ICD-10-CM | POA: Diagnosis not present

## 2015-12-21 DIAGNOSIS — R42 Dizziness and giddiness: Secondary | ICD-10-CM | POA: Diagnosis not present

## 2015-12-21 DIAGNOSIS — I999 Unspecified disorder of circulatory system: Secondary | ICD-10-CM | POA: Diagnosis not present

## 2015-12-21 LAB — CBC WITH DIFFERENTIAL/PLATELET
BASOS ABS: 0 10*3/uL (ref 0.0–0.1)
BASOS PCT: 0 %
Eosinophils Absolute: 0.2 10*3/uL (ref 0.0–0.7)
Eosinophils Relative: 2 %
HEMATOCRIT: 37.3 % (ref 36.0–46.0)
HEMOGLOBIN: 11.8 g/dL — AB (ref 12.0–15.0)
Lymphocytes Relative: 29 %
Lymphs Abs: 2.3 10*3/uL (ref 0.7–4.0)
MCH: 27.7 pg (ref 26.0–34.0)
MCHC: 31.6 g/dL (ref 30.0–36.0)
MCV: 87.6 fL (ref 78.0–100.0)
Monocytes Absolute: 0.7 10*3/uL (ref 0.1–1.0)
Monocytes Relative: 9 %
NEUTROS ABS: 4.8 10*3/uL (ref 1.7–7.7)
NEUTROS PCT: 60 %
Platelets: 266 10*3/uL (ref 150–400)
RBC: 4.26 MIL/uL (ref 3.87–5.11)
RDW: 18.2 % — ABNORMAL HIGH (ref 11.5–15.5)
WBC: 8 10*3/uL (ref 4.0–10.5)

## 2015-12-21 LAB — COMPREHENSIVE METABOLIC PANEL
ALK PHOS: 273 U/L — AB (ref 38–126)
ALT: 37 U/L (ref 14–54)
ANION GAP: 8 (ref 5–15)
AST: 53 U/L — ABNORMAL HIGH (ref 15–41)
Albumin: 3.2 g/dL — ABNORMAL LOW (ref 3.5–5.0)
BILIRUBIN TOTAL: 0.5 mg/dL (ref 0.3–1.2)
BUN: 19 mg/dL (ref 6–20)
CALCIUM: 9 mg/dL (ref 8.9–10.3)
CO2: 26 mmol/L (ref 22–32)
Chloride: 101 mmol/L (ref 101–111)
Creatinine, Ser: 1.21 mg/dL — ABNORMAL HIGH (ref 0.44–1.00)
GFR calc non Af Amer: 47 mL/min — ABNORMAL LOW (ref >60)
GFR, EST AFRICAN AMERICAN: 54 mL/min — AB (ref >60)
Glucose, Bld: 183 mg/dL — ABNORMAL HIGH (ref 65–99)
POTASSIUM: 4.3 mmol/L (ref 3.5–5.1)
SODIUM: 135 mmol/L (ref 135–145)
TOTAL PROTEIN: 7.6 g/dL (ref 6.5–8.1)

## 2015-12-21 MED ORDER — HEPARIN SOD (PORK) LOCK FLUSH 100 UNIT/ML IV SOLN
INTRAVENOUS | Status: AC
  Filled 2015-12-21: qty 5

## 2015-12-21 MED ORDER — GADOBENATE DIMEGLUMINE 529 MG/ML IV SOLN
20.0000 mL | Freq: Once | INTRAVENOUS | Status: AC | PRN
  Administered 2015-12-21: 20 mL via INTRAVENOUS

## 2015-12-21 MED ORDER — SODIUM CHLORIDE 0.9 % IV SOLN
INTRAVENOUS | Status: DC
  Administered 2015-12-21: 10:00:00 via INTRAVENOUS

## 2015-12-21 MED ORDER — OXYCODONE HCL 10 MG PO TABS: ORAL_TABLET | ORAL | 0 refills | Status: DC

## 2015-12-21 NOTE — Patient Instructions (Addendum)
[place] at [facility] Discharge Instructions  RECOMMENDATIONS MADE BY THE CONSULTANT AND ANY TEST RESULTS WILL BE SENT TO YOUR REFERRING PHYSICIAN.  You saw Kirby Crigler, PA-C, today. We are going to do a MRI of the brain today after we give you some IV fluids for hydration.   Return next week for Xgeva.   Please see Amy as you leave for appointments.    Thank you for choosing [place] at [facility] to provide your oncology and hematology care.  To afford each patient quality time with our provider, please arrive at least 15 minutes before your scheduled appointment time.   Beginning January 23rd 2017 lab work for the [facility] will be done in the  Main lab at [facility] on 1st floor. If you have a lab appointment with the [facility] please come in thru the  Main Entrance and check in at the main information desk  You need to re-schedule your appointment should you arrive 10 or more minutes late.  We strive to give you quality time with our providers, and arriving late affects you and other patients whose appointments are after yours.  Also, if you no show three or more times for appointments you may be dismissed from the clinic at the providers discretion.     Again, thank you for choosing [facility].  Our hope is that these requests will decrease the amount of time that you wait before being seen by our physicians.       _____________________________________________________________  Should you have questions after your visit to [facility], please contact our office at [phone] between the hours of 8:30 a.m. and 4:30 p.m.  Voicemails left after 4:30 p.m. will not be returned until the following business day.  For prescription refill requests, have your pharmacy contact our office.         Resources For Cancer Patients and their Caregivers ? American Cancer Society: Can assist with  transportation, wigs, general needs, runs Look Good Feel Better.        905-349-4667 ? Cancer Care: Provides financial assistance, online support groups, medication/co-pay assistance.  1-800-813-HOPE 405-169-9275) ? [place] Assists [place] cancer patients and their families through emotional , educational and financial support.  548 186 9404 ? [place] DSS Where to apply for food stamps, Medicaid and utility assistance. 316-807-5368 ? [facility]: Transportation to medical appointments. 724-017-9177 ? Social Security Administration: May apply for disability if have a Stage IV cancer. 262-767-3237 561-254-2464 ? [employer], Disability and Transit Services: Assists with nutrition, care and transit needs. [place] Support Programs: @10RELATIVEDAYS @ > Cancer Support Group  2nd Tuesday of the month 1pm-2pm, Journey Room  > Creative Journey  3rd Tuesday of the month 1130am-1pm, Journey Room  > Look Good Feel Better  1st Wednesday of the month 10am-12 noon, Journey Room (Call [place] to register [phone])

## 2015-12-21 NOTE — Assessment & Plan Note (Signed)
Continue Xgeva, calcium, vitamin D  Next Xgeva injection is due in 1 week

## 2015-12-21 NOTE — Progress Notes (Signed)
Chemotherapy teaching went back over with pt.  Pt is taking her Xeloda as prescribed 7 days on and 7 days off.  She is tolerating Xeloda well with no side effects to report.  Extensive teaching packet given.    Home health referral sent for medication management and vital sign check.  Linda notified.

## 2015-12-21 NOTE — Progress Notes (Signed)
[name], MD [place] [address]  Infiltrating ductal carcinoma of breast, unspecified laterality ([place]) - Plan: Cancer antigen 27.29, Cancer antigen 15-3, Cancer antigen 27.29, Cancer antigen 15-3, MR Brain W Wo Contrast, Oxycodone HCl 10 MG TABS  Bone metastasis (HCC) - Plan: Cancer antigen 27.29, Cancer antigen 15-3, Cancer antigen 27.29, Cancer antigen 15-3, MR Brain W Wo Contrast, Oxycodone HCl 10 MG TABS  Tachycardia - Plan: Cancer antigen 27.29, Cancer antigen 15-3, Cancer antigen 27.29, Cancer antigen 15-3, MR Brain W Wo Contrast, 0.9 %  sodium chloride infusion, Oxycodone HCl 10 MG TABS  Invasive ductal carcinoma of breast, unspecified laterality (HCC) - Plan: Cancer antigen 27.29, Cancer antigen 15-3, Cancer antigen 27.29, Cancer antigen 15-3, MR Brain W Wo Contrast, Oxycodone HCl 10 MG TABS  CURRENT THERAPY: Xeloda 2000 mg PO BID 7 days on and 7 days off.  INTERVAL HISTORY: Kaylee Mitchell 60 y.o. female returns for followup of Stage IV breast cancer, ER/PR+, HER2 neg, with new hepatic metastasis (biopsy proven).      Invasive ductal carcinoma of breast ([place])   05/26/2009 Initial Diagnosis    Needle core biopsy showed invasive mammary carcinoma with 1 + lymph node      06/03/2009 Surgery    Right modified radical mastectomy with 8/8 + lymph nodes by Dr. Ronnell Freshwater      07/22/2009 Cancer Staging    Bone scan + for multiple osseous lesions.  No disease elsewhere.      07/24/2009 - 03/23/2010 Chemotherapy    Arimidex      03/23/2010 Progression         03/23/2010 - 12/05/2012 Chemotherapy    Tamoxifen      11/28/2012 Progression    Worsening of lumbar spine osseous disease      12/06/2012 - 09/25/2014 Chemotherapy    Fulvestrant + Exemestane      01/24/2014 PET scan    No evidence of hypermetabolic recurrent or metastatic disease. Widespread osseous metastasis, without significant marrow hypermetabolism.      09/18/2014  Progression    PET- New hepatic metastasis within the LEFT hepatic lobe involving segments IVA, IVB, and III.      10/20/2014 - 11/13/2014 Chemotherapy    Afinitor + Exemestane      11/13/2014 Adverse Reaction    Increasing fatigue      11/13/2014 Treatment Plan Change    Hold Afinitor, will reduce dose to 7.5 mg daily.  Continue Exemestane and Xgeva.      11/24/2014 - 01/16/2015 Chemotherapy    Afinitor 7.5 mg daily, Exemestane daily, and continuation of Xgeva.      01/16/2015 Adverse Reaction    Cough, ?Afinitor-induced      01/16/2015 - 02/02/2015 Chemotherapy    HOLD AFFINITOR secondary to cough, weight loss, Continue EXEMESTANE and Xgeva      01/30/2015 PET scan    Significant partial metabolic treatment response in liver metastases, nonenlarged R level 2 cervical LN, non specific, stable sclerotic osseous metastases throughout skeleton      02/02/2015 Adverse Reaction    Weight loss, decreased appetite      02/02/2015 Treatment Plan Change    Afinitor dose further reduced to 5 mg.      02/02/2015 -  Chemotherapy    Afinitor 5 mg and Exemestane with continuation of Xgeva      06/03/2015 Imaging    CT CAP- Hepatic metastatic disease appears to have improved slightly in  the interval from 01/30/2015. Diffuse osseous metastatic disease.      08/21/2015 Imaging    CT CAP- Stable CT of the chest. No evidence for metastatic disease in the thorax. Overall, no substantial change in pre-existing liver lesions. There may be a new 10 mm lesion in the posterior right dome of liver, not seen previously.       09/30/2015 Imaging    MRI liver- 1. Innumerable (> than 30) confluent liver metastases throughout the liver, significantly better visualized at MRI compared to prior CT and PET-CT studies, which limits comparison. By best estimate, there has been progression of the liver metastases compared to the 06/03/2015 CT study. 2. Patchy lesions throughout the thoracolumbar spine,  correlating with the known extensive sclerotic osseous metastatic disease as seen on multiple prior CT studies, not appreciably changed.      09/30/2015 Progression    MRI of liver demonstrates significant progression of disease and climbing CA 27.29 and CA 15-3.      10/09/2015 Procedure    Port placement by Dr. Arnoldo Morale      10/13/2015 - 11/17/2015 Chemotherapy    The patient had PACLitaxel (TAXOL) 138 mg in dextrose 5 % 250 mL chemo infusion (Dose modification: 60 mg/m2 (original dose 80 mg/m2, Cycle 1, Reason: Change in LFTs), 60 mg/m2 (original dose 80 mg/m2, Cycle 1, Reason: Change in LFTs)  for chemotherapy treatment.        11/24/2015 Treatment Plan Change    Treatment deferred x 1 week.      12/01/2015 Adverse Reaction    Paclitaxel-induced peripheral neuropathy      12/09/2015 Treatment Plan Change    Change to Xeloda 1000 mg/2 BID 7 days on and 7 days off.      12/21/2015 Imaging    MRI brain- 1. Motion degraded study without acute finding or evidence of intracranial metastatic disease. 2. Known osseous metastases. 3. 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      12/21/2015 Treatment Plan Change    Xeloda put on hold from 12/21/15- 12/24/2015       She feels poor. "I'm not giving up.  I am tired of not doing what I used to do." She reports dizziness and the feeling of impending LOC.  She has not fallen. She denies any LOC or trauma. "I feel like I am walking crisscrossed." She reports that she is unable to go to the grocery store.  That is how bad she feels. Her son is helping at home. Her neuropathy persists.  Feet are better, hands are worse.  She notes that she cannot write with her hands or manipulate feeding utensils due to her neuropathy.  Weight is down another 11 lbs.  Currently on her week off from Xeloda and due to restart tomorrow.  Review of Systems  Constitutional: Positive for malaise/fatigue and weight loss. Negative for  chills and fever.  HENT: Negative.   Eyes: Negative.  Negative for blurred vision and double vision.  Respiratory: Negative for cough.   Cardiovascular: Negative for chest pain.  Gastrointestinal: Negative.  Negative for constipation, diarrhea, nausea and vomiting.  Genitourinary: Negative.  Negative for dysuria.  Musculoskeletal: Negative.  Negative for falls.  Skin: Negative.   Neurological: Positive for dizziness, tingling, sensory change and weakness. Negative for seizures, loss of consciousness and headaches.  Endo/Heme/Allergies: Negative.   Psychiatric/Behavioral: Positive for depression.    Past Medical History:  Diagnosis Date  . Arthritis   . Bone metastasis ([place]) 09/13/2010  .  Cancer ([place])    rt breast is primary  . Depression   . Depression 12/14/2011  . Diabetes mellitus    type II  . DNR (do not resuscitate) 11/05/2014  . GERD (gastroesophageal reflux disease)   . High cholesterol   . Hypertension   . Invasive ductal carcinoma of breast ([place]) 09/13/2010  . Knee pain, bilateral 2013  . Opioid contract exists with [facility] 10/24/2014   Contract found in Chart review in letter tab.    Past Surgical History:  Procedure Laterality Date  . ABDOMINAL HYSTERECTOMY    . KNEE SURGERY Bilateral   . MASTECTOMY     right side  . PORTACATH PLACEMENT  10/09/2015   Procedure: INSERTION PORT-A-CATH LEFT SUBCLAVIAN;  Surgeon: Aviva Signs, MD;  Location: [facility];  Service: General;;    Family History  Problem Relation Age of Onset  . Diabetes Mother   . Diabetes Father   . Arthritis    . Cancer    . Diabetes    . Cancer Maternal Aunt   . Anesthesia problems Neg Hx   . Hypotension Neg Hx   . Malignant hyperthermia Neg Hx   . Pseudochol deficiency Neg Hx     Social History   Social History  . Marital status: Single    Spouse name: N/A  . Number of children: N/A  . Years of education: 91   Occupational History  . disabled Unemployed   Social  History Main Topics  . Smoking status: Never Smoker  . Smokeless tobacco: Never Used  . Alcohol use No  . Drug use: No  . Sexual activity: Yes    Birth control/ protection: Surgical   Other Topics Concern  . None   Social History Narrative  . None     PHYSICAL EXAMINATION  ECOG PERFORMANCE STATUS: 1 - Symptomatic but completely ambulatory  Vitals:   12/21/15 0851  BP: 124/69  Pulse: (!) 111  Resp: 18  Temp: 97.9 F (36.6 C)    GENERAL:alert, well nourished, well developed, comfortable, cooperative, obese, smiling and accompanied by her friend, she is in a wheelchair, looks poor. SKIN: skin color, texture, turgor are normal, no rashes or significant lesions HEAD: Normocephalic, No masses, lesions, tenderness or abnormalities EYES: normal, EOMI, Conjunctiva are pink and non-injected EARS: External ears normal OROPHARYNX:lips, buccal mucosa, and tongue normal and mucous membranes are moist  NECK: supple, trachea midline LYMPH:  not examined BREAST:not examined LUNGS: clear to auscultation without wheezes, rales, or rhonchi. HEART: regular rate & rhythm, no murmurs, no gallops, tachycardia (HR 120 apically), S1 normal and S2 normal ABDOMEN:abdomen soft, non-tender, obese, normal bowel sounds and no masses or organomegaly BACK: Back symmetric, no curvature. EXTREMITIES:less then 2 second capillary refill, no joint deformities, effusion, or inflammation, no edema, no skin discoloration. NEURO: alert & oriented x 3 with fluent speech, no focal motor/sensory deficits   LABORATORY DATA: CBC    Component Value Date/Time   WBC 8.0 12/21/2015 0938   RBC 4.26 12/21/2015 0938   HGB 11.8 (L) 12/21/2015 0938   HCT 37.3 12/21/2015 0938   PLT 266 12/21/2015 0938   MCV 87.6 12/21/2015 0938   MCH 27.7 12/21/2015 0938   MCHC 31.6 12/21/2015 0938   RDW 18.2 (H) 12/21/2015 0938   LYMPHSABS 2.3 12/21/2015 0938   MONOABS 0.7 12/21/2015 0938   EOSABS 0.2 12/21/2015 0938   BASOSABS  0.0 12/21/2015 0938      Chemistry  Component Value Date/Time   NA 135 12/21/2015 0938   K 4.3 12/21/2015 0938   CL 101 12/21/2015 0938   CO2 26 12/21/2015 0938   BUN 19 12/21/2015 0938   CREATININE 1.21 (H) 12/21/2015 0938      Component Value Date/Time   CALCIUM 9.0 12/21/2015 0938   ALKPHOS 273 (H) 12/21/2015 0938   AST 53 (H) 12/21/2015 0938   ALT 37 12/21/2015 0938   BILITOT 0.5 12/21/2015 7341     Lab Results  Component Value Date   LABCA2 108.0 (H) 11/10/2015    PENDING LABS:   RADIOGRAPHIC STUDIES:  Mr Jeri Cos PF Contrast  Result Date: 12/21/2015 CLINICAL DATA:  Dizziness and feeling of loss of consciousness. Stage IV breast cancer. Evaluate metastatic disease. EXAM: MRI HEAD WITHOUT AND WITH CONTRAST TECHNIQUE: Multiplanar, multiecho pulse sequences of the brain and surrounding structures were obtained without and with intravenous contrast. CONTRAST:  45m MULTIHANCE GADOBENATE DIMEGLUMINE 529 MG/ML IV SOLN COMPARISON:  PET-CT 01/30/2015 FINDINGS: Brain: No enhancement or edema to suggest metastatic disease. Left frontal developmental venous anomaly. Patchy FLAIR and T2 hyperintensity in the pons and to a lesser degree cerebral white matter attributed to chronic microvascular disease in this patient with vascular risk factors. Vascular: Normal flow voids. Left frontal developmental venous anomaly. Skull and upper cervical spine: Heterogeneous marrow from known osseous metastatic disease. Osseous metastases discussed on 01/30/2015 PET-CT. Sinuses/Orbits: Small proteinaceous Thornwaldt cyst. IMPRESSION: 1. Motion degraded study without acute finding or evidence of intracranial metastatic disease. 2. Known osseous metastases. 3. 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 Electronically Signed   By: JMonte FantasiaM.D.   On: 12/21/2015 13:28     PATHOLOGY:    ASSESSMENT AND PLAN:  [place] of breast [place] Stage IV Breast cancer with  bone involvement requiring Xgeva.  Now on Xeloda 7 days on and 7 days off.  Oncology history is updated.  Labs today: CBC diff, CMET, CA 27.29 and CA 15-3.  I personally reviewed and went over laboratory results with the patient.  The results are noted within this dictation.   She is on Cymbalta for neuropathy pain and depression since 11/20/2015.  She reports PN symptoms in feet are improved, but hands are not.  This is grade 3 at this time.  Due to her dizziness, feeling of LOC without known LOC or falls, I will get her set-up for MRI of brain w and wo contrast to evaluate for metastatic disease.  I personally reviewed and went over radiographic studies with the patient.  The results are noted within this dictation.  MRI is negative for any acute findings and metastatic disease.  I will order Home Health for medication management and vital checks.  I will give IV fluids today as well at 250 cc/hr.  She reports plently of H2O intake at home.  She is mildly tachycardic.  She denies any chest pain or shortness of breath.  [facility]  I have refilled her pain medication.  DO NOT RESUSCITATE.  DNR form filled out and provided to the patient on 10/24/2014.  She will return later this week as a work-in.  She will HOLD her Xeloda until otherwise advised.  Bone metastasis ([place] Continue Xgeva, calcium, vitamin D  Next Xgeva injection is due in 1 week   ORDERS PLACED FOR THIS ENCOUNTER: Orders Placed This Encounter  Procedures  . MR Brain W Wo Contrast  . Cancer antigen  27.29  . Cancer antigen 15-3  . Cancer antigen 27.29  . Cancer antigen 15-3    MEDICATIONS PRESCRIBED THIS ENCOUNTER: Meds ordered this encounter  Medications  . Oxycodone HCl 10 MG TABS    Sig: Take 1 tablet every 4 hours as needed for pain.    Dispense:  100 tablet    Refill:  0    THERAPY PLAN:  HOLD XELODA until otherwise directed.  Short-interval follow-up  appt.  All questions were answered. The patient knows to call the clinic with any problems, questions or concerns. We can certainly see the patient much sooner if necessary.  Patient and plan discussed with Dr. Ancil Linsey and she is in agreement with the aforementioned.   This note is electronically signed by: Doy Mince 12/21/2015 5:47 PM

## 2015-12-21 NOTE — Assessment & Plan Note (Addendum)
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  Now on Xeloda 7 days on and 7 days off.  Oncology history is updated.  Labs today: CBC diff, CMET, CA 27.29 and CA 15-3.  I personally reviewed and went over laboratory results with the patient.  The results are noted within this dictation.   She is on Cymbalta for neuropathy pain and depression since 11/20/2015.  She reports PN symptoms in feet are improved, but hands are not.  This is grade 3 at this time.  Due to her dizziness, feeling of LOC without known LOC or falls, I will get her set-up for MRI of brain w and wo contrast to evaluate for metastatic disease.  I personally reviewed and went over radiographic studies with the patient.  The results are noted within this dictation.  MRI is negative for any acute findings and metastatic disease.  I will order Home Health for medication management and vital checks.  I will give IV fluids today as well at 250 cc/hr.  She reports plently of H2O intake at home.  She is mildly tachycardic.  She denies any chest pain or shortness of breath.  Pain contract in place with [facility].  I have refilled her pain medication.  DO NOT RESUSCITATE.  DNR form filled out and provided to the patient on 10/24/2014.  She will return later this week as a work-in.  She will HOLD her Xeloda until otherwise advised.

## 2015-12-22 LAB — CANCER ANTIGEN 27.29: CA 27.29: 88 U/mL — ABNORMAL HIGH (ref 0.0–38.6)

## 2015-12-22 LAB — CANCER ANTIGEN 15-3: CA 15-3: 62.6 U/mL — ABNORMAL HIGH (ref 0.0–25.0)

## 2015-12-24 ENCOUNTER — Inpatient Hospital Stay (HOSPITAL_COMMUNITY): Payer: Medicare Other

## 2015-12-24 ENCOUNTER — Ambulatory Visit (HOSPITAL_COMMUNITY): Payer: Medicare Other | Admitting: Oncology

## 2015-12-24 ENCOUNTER — Encounter: Payer: Self-pay | Admitting: Dietician

## 2015-12-24 ENCOUNTER — Encounter (HOSPITAL_BASED_OUTPATIENT_CLINIC_OR_DEPARTMENT_OTHER): Payer: Medicare Other | Admitting: Hematology & Oncology

## 2015-12-24 ENCOUNTER — Encounter (HOSPITAL_COMMUNITY): Payer: Self-pay | Admitting: Hematology & Oncology

## 2015-12-24 ENCOUNTER — Other Ambulatory Visit (HOSPITAL_COMMUNITY): Payer: Medicare Other

## 2015-12-24 VITALS — BP 125/96 | HR 123 | Temp 98.4°F | Resp 16 | Wt 228.0 lb

## 2015-12-24 DIAGNOSIS — C787 Secondary malignant neoplasm of liver and intrahepatic bile duct: Secondary | ICD-10-CM | POA: Diagnosis not present

## 2015-12-24 DIAGNOSIS — Z452 Encounter for adjustment and management of vascular access device: Secondary | ICD-10-CM | POA: Diagnosis not present

## 2015-12-24 DIAGNOSIS — E119 Type 2 diabetes mellitus without complications: Secondary | ICD-10-CM | POA: Diagnosis not present

## 2015-12-24 DIAGNOSIS — C50919 Malignant neoplasm of unspecified site of unspecified female breast: Secondary | ICD-10-CM

## 2015-12-24 DIAGNOSIS — K219 Gastro-esophageal reflux disease without esophagitis: Secondary | ICD-10-CM | POA: Diagnosis not present

## 2015-12-24 DIAGNOSIS — Z7189 Other specified counseling: Secondary | ICD-10-CM

## 2015-12-24 DIAGNOSIS — C50911 Malignant neoplasm of unspecified site of right female breast: Secondary | ICD-10-CM | POA: Diagnosis not present

## 2015-12-24 DIAGNOSIS — E1142 Type 2 diabetes mellitus with diabetic polyneuropathy: Secondary | ICD-10-CM | POA: Diagnosis not present

## 2015-12-24 DIAGNOSIS — I10 Essential (primary) hypertension: Secondary | ICD-10-CM | POA: Diagnosis not present

## 2015-12-24 DIAGNOSIS — T451X5D Adverse effect of antineoplastic and immunosuppressive drugs, subsequent encounter: Secondary | ICD-10-CM | POA: Diagnosis not present

## 2015-12-24 DIAGNOSIS — C7951 Secondary malignant neoplasm of bone: Secondary | ICD-10-CM | POA: Diagnosis not present

## 2015-12-24 DIAGNOSIS — M899 Disorder of bone, unspecified: Secondary | ICD-10-CM | POA: Diagnosis not present

## 2015-12-24 DIAGNOSIS — R110 Nausea: Secondary | ICD-10-CM

## 2015-12-24 DIAGNOSIS — G620 Drug-induced polyneuropathy: Secondary | ICD-10-CM | POA: Diagnosis not present

## 2015-12-24 DIAGNOSIS — Z79891 Long term (current) use of opiate analgesic: Secondary | ICD-10-CM | POA: Diagnosis not present

## 2015-12-24 DIAGNOSIS — Z9181 History of falling: Secondary | ICD-10-CM | POA: Diagnosis not present

## 2015-12-24 DIAGNOSIS — M1991 Primary osteoarthritis, unspecified site: Secondary | ICD-10-CM | POA: Diagnosis not present

## 2015-12-24 DIAGNOSIS — F329 Major depressive disorder, single episode, unspecified: Secondary | ICD-10-CM | POA: Diagnosis not present

## 2015-12-24 NOTE — Progress Notes (Signed)
RD following up with Stage 4 Breast Cancer pt. Patient with continued wt loss/poor appetite.   Contacted Pt by Visiting during office visit  Pt has not made any dietary changes and is not taking the medications meant to deal with her symptoms that are causing poor PO intake. Does not seem willing to change eating/dietary habits.   Wt Readings from Last 10 Encounters:  12/24/15 228 lb (103.4 kg)  12/21/15 229 lb 3.2 oz (104 kg)  12/01/15 240 lb (108.9 kg)  11/24/15 242 lb (109.8 kg)  11/17/15 239 lb 12.8 oz (108.8 kg)  11/14/15 250 lb (113.4 kg)  11/10/15 250 lb (113.4 kg)  10/27/15 241 lb 8 oz (109.5 kg)  10/20/15 244 lb 12.8 oz (111 kg)  10/13/15 246 lb 6.4 oz (111.8 kg)   Patient's weight has fallen by 15 lbs (6.3%) in only 1 month.   She says she feels better. Yesterday was "the best Ive felt in a long time".   She does not have any appetite and still has very severe taste changes.   Her first meal is at 12-1 pm. This is typically fast food. She doesn't eat again until dinner which consists of just a piece of fruit. She drinks mostly water, sometimes milk.   Unfortunately, even with continued education, pt's poor dietary habits are unchanged:  -She is still eating one meal a day which is 12:00-1pm. She states this is because of her lack of appetite and her morning nausea. She has been prescribed marinol to help with both of these, but she says she forgot about it and hasnt picked it up from the pharmacy. She does not know if she is taking Zofran or not. She notes very poor understanding of the medications she is taking and what they are for.   -She is still drinking mostly 0 kcal beverage and fibrous/low kcal foods such as fruits. She had been educated to exchange her water and fruit for calorie containing beverages and fruit for high kcal/pro food such as PB, EGGs, Cheese. She says she only eats fruit because these are the only foods that taste good to her.   -Still has not found  a nutritional supplement. She does not like Ensure/Boost and is not willing to drink these. RD had urged her to find one she liked and had listed names of different supplements.   -She has not been using Protein Powder as had been recommended.- didn't ask as to why she isnt doing this.   MD is going over meds with patient today.   Though pt feels good, her eating habits are very poor. Unfortunately, there is little RD can do if she is not willing to attempt recommendations.   Burtis Junes RD, LDN, [place] [facility] Pager: [phone] 12/24/2015 2:00 PM

## 2015-12-24 NOTE — Patient Instructions (Addendum)
[place] at [facility] Discharge Instructions  RECOMMENDATIONS MADE BY THE CONSULTANT AND ANY TEST RESULTS WILL BE SENT TO YOUR REFERRING PHYSICIAN.  You saw Dr.Penland today. BRING MEDICATION BOTTLES TO NEXT VISIT!! Return to clinic after Christmas for labs and follow up. See Amy at checkout for appointments.   Thank you for choosing [place] at [facility] to provide your oncology and hematology care.  To afford each patient quality time with our provider, please arrive at least 15 minutes before your scheduled appointment time.   Beginning January 23rd 2017 lab work for the [facility] will be done in the  Main lab at [facility] on 1st floor. If you have a lab appointment with the [facility] please come in thru the  Main Entrance and check in at the main information desk  You need to re-schedule your appointment should you arrive 10 or more minutes late.  We strive to give you quality time with our providers, and arriving late affects you and other patients whose appointments are after yours.  Also, if you no show three or more times for appointments you may be dismissed from the clinic at the providers discretion.     Again, thank you for choosing [facility].  Our hope is that these requests will decrease the amount of time that you wait before being seen by our physicians.       _____________________________________________________________  Should you have questions after your visit to [facility], please contact our office at [phone] between the hours of 8:30 a.m. and 4:30 p.m.  Voicemails left after 4:30 p.m. will not be returned until the following business day.  For prescription refill requests, have your pharmacy contact our office.         Resources For Cancer Patients and their Caregivers ? American Cancer Society: Can assist with transportation, wigs, general needs, runs Look Good  Feel Better.        (989)124-8909 ? Cancer Care: Provides financial assistance, online support groups, medication/co-pay assistance.  1-800-813-HOPE 878-206-4451) ? [place] Assists [place] cancer patients and their families through emotional , educational and financial support.  573-573-4513 ? [place] DSS Where to apply for food stamps, Medicaid and utility assistance. 910-366-3428 ? RCATS: Transportation to medical appointments. (443)532-4531 ? Social Security Administration: May apply for disability if have a Stage IV cancer. 5145982836 (585)044-9577 ? [employer], Disability and Transit Services: Assists with nutrition, care and transit needs. [place] Support Programs: @10RELATIVEDAYS @ > Cancer Support Group  2nd Tuesday of the month 1pm-2pm, Journey Room  > Creative Journey  3rd Tuesday of the month 1130am-1pm, Journey Room  > Look Good Feel Better  1st Wednesday of the month 10am-12 noon, Journey Room (Call [place] to register [phone])

## 2015-12-24 NOTE — Progress Notes (Signed)
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 7 cm primary tumor, extensive LVI. With focal extenion to the anterior surface margin, 8 of 8 lymph nodes were involved with metastatic disease.    ER positive 100%, PR positive 99%, HER2 neu negative, Ki-67 marker 14%.   Mastectomy followed by radiation therapy to T4 through T7 due to severe involvement at this area.  Arimidex initially from 07/2009 through 03/23/2010 and was progressing by bone scan criteria. We switched her to tamoxifen.   Because of worsening bone pain, MRI of lumbar spine completed on 11/29/2012 demonstrated progressive diffuse lumbar spine metastatic disease and therefore therapy was changed to Fulvestrant and Exemestane on 12/11/2012, tolerating well except for vasomotor instability    Invasive ductal carcinoma of breast ([place])   05/26/2009 Initial Diagnosis    Needle core biopsy showed invasive mammary carcinoma with 1 + lymph node      06/03/2009 Surgery    Right modified radical mastectomy with 8/8 + lymph nodes by Dr. Ronnell Freshwater      07/22/2009 Cancer Staging    Bone scan + for multiple osseous lesions.  No disease elsewhere.      07/24/2009 - 03/23/2010 Chemotherapy    Arimidex      03/23/2010 Progression         03/23/2010 - 12/05/2012 Chemotherapy    Tamoxifen      11/28/2012 Progression    Worsening of lumbar spine osseous disease      12/06/2012 - 09/25/2014 Chemotherapy    Fulvestrant + Exemestane      01/24/2014 PET scan    No evidence of hypermetabolic recurrent or metastatic disease. Widespread osseous metastasis, without significant marrow hypermetabolism.      09/18/2014 Progression    PET- New hepatic metastasis within the LEFT hepatic lobe involving segments IVA, IVB, and III.      10/20/2014 - 11/13/2014 Chemotherapy    Afinitor + Exemestane      11/13/2014  Adverse Reaction    Increasing fatigue      11/13/2014 Treatment Plan Change    Hold Afinitor, will reduce dose to 7.5 mg daily.  Continue Exemestane and Xgeva.      11/24/2014 - 01/16/2015 Chemotherapy    Afinitor 7.5 mg daily, Exemestane daily, and continuation of Xgeva.      01/16/2015 Adverse Reaction    Cough, ?Afinitor-induced      01/16/2015 - 02/02/2015 Chemotherapy    HOLD AFFINITOR secondary to cough, weight loss, Continue EXEMESTANE and Xgeva      01/30/2015 PET scan    Significant partial metabolic treatment response in liver metastases, nonenlarged R level 2 cervical LN, non specific, stable sclerotic osseous metastases throughout skeleton      02/02/2015 Adverse Reaction    Weight loss, decreased appetite      02/02/2015 Treatment Plan Change    Afinitor dose further reduced to 5 mg.      02/02/2015 -  Chemotherapy    Afinitor 5 mg and Exemestane with continuation of Xgeva      06/03/2015 Imaging    CT CAP- Hepatic metastatic disease appears to have improved slightly in the interval from 01/30/2015. Diffuse osseous metastatic disease.      08/21/2015 Imaging    CT CAP- Stable CT of the chest. No evidence for metastatic disease in the thorax. Overall, no substantial change in pre-existing liver lesions.  There may be a new 10 mm lesion in the posterior right dome of liver, not seen previously.       09/30/2015 Imaging    MRI liver- 1. Innumerable (> than 30) confluent liver metastases throughout the liver, significantly better visualized at MRI compared to prior CT and PET-CT studies, which limits comparison. By best estimate, there has been progression of the liver metastases compared to the 06/03/2015 CT study. 2. Patchy lesions throughout the thoracolumbar spine, correlating with the known extensive sclerotic osseous metastatic disease as seen on multiple prior CT studies, not appreciably changed.      09/30/2015 Progression    MRI of liver demonstrates  significant progression of disease and climbing CA 27.29 and CA 15-3.      10/09/2015 Procedure    Port placement by Dr. Arnoldo Morale      10/13/2015 - 11/17/2015 Chemotherapy    The patient had PACLitaxel (TAXOL) 138 mg in dextrose 5 % 250 mL chemo infusion (Dose modification: 60 mg/m2 (original dose 80 mg/m2, Cycle 1, Reason: Change in LFTs), 60 mg/m2 (original dose 80 mg/m2, Cycle 1, Reason: Change in LFTs)  for chemotherapy treatment.        11/24/2015 Treatment Plan Change    Treatment deferred x 1 week.      12/01/2015 Adverse Reaction    Paclitaxel-induced peripheral neuropathy      12/09/2015 Treatment Plan Change    Change to Xeloda 1000 mg/2 BID 7 days on and 7 days off.      12/21/2015 Imaging    MRI brain- 1. Motion degraded study without acute finding or evidence of intracranial metastatic disease. 2. Known osseous metastases. 3. 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      12/21/2015 Treatment Plan Change    Xeloda put on hold from 12/21/15- 12/24/2015      CURRENT THERAPY: Xeloda  INTERVAL HISTORY:  Kaylee Mitchell 60 y.o. female returns for follow-up of her breast cancer, stage IV disease.   Patient is accompanied by her friend.  She has not taken Xeloda since she was told to take some time off treatment. However, patient continues not to eat. Taste changes have turned her away from food. She never feels well. She has no energy. She notes that she stays nauseated wether or not she is taking Xeloda or not taking it.   Patient says she has so many medications and does not know what she is taking or why. She forgot she was prescribed Marinol and has not been taking it. Kaylee Mitchell also mentioned confusing Xeloda with Cymbalta. She may have been taking a high dose of Cymbalta. She forgets to bring her medications to her appointments to review with [place].   She needs a refill for Zofran  She denies headaches or blurry vision. She denies any difficulties with  her memory   MEDICAL HISTORY: Past Medical History:  Diagnosis Date  . Arthritis   . Bone metastasis ([place]) 09/13/2010  . Cancer ([place])    rt breast is primary  . Depression   . Depression 12/14/2011  . Diabetes mellitus    type II  . DNR (do not resuscitate) 11/05/2014  . GERD (gastroesophageal reflux disease)   . High cholesterol   . Hypertension   . Invasive ductal carcinoma of breast ([place]) 09/13/2010  . Knee pain, bilateral 2013  . Opioid contract exists with [facility] 10/24/2014   Contract found in Chart review in letter tab.    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     has No Known Allergies.  Kaylee Mitchell had no medications administered during this visit.  SURGICAL HISTORY: Past Surgical History:  Procedure Laterality Date  . ABDOMINAL HYSTERECTOMY    . KNEE SURGERY Bilateral   . MASTECTOMY     right side  . PORTACATH PLACEMENT  10/09/2015   Procedure: INSERTION PORT-A-CATH LEFT SUBCLAVIAN;  Surgeon: Aviva Signs, MD;  Location: [facility];  Service: General;;    SOCIAL HISTORY: Social History   Social History  . Marital status: Single    Spouse name: N/A  . Number of children: N/A  . Years of education: 11   Occupational History  . disabled Unemployed   Social History Main Topics  . Smoking status: Never Smoker  . Smokeless tobacco: Never Used  . Alcohol use No  . Drug use: No  . Sexual  activity: Yes    Birth control/ protection: Surgical   Other Topics Concern  . Not on file   Social History Narrative  . No narrative on file    FAMILY HISTORY: Family History  Problem Relation Age of Onset  . Diabetes Mother   . Diabetes Father   . Arthritis    . Cancer    . Diabetes    . Cancer Maternal Aunt   . Anesthesia problems Neg Hx   . Hypotension Neg Hx   . Malignant hyperthermia Neg Hx   . Pseudochol deficiency Neg Hx     Review of Systems  Constitutional:  Positive for malaise/fatigue, weight loss, and taste changes secondary to chemotherapy. Negative for fever, chills and diaphoresis.      Loss of 20 lbs since September. HENT: Negative for congestion, ear discharge, ear pain, hearing loss, nosebleeds, sore throat and tinnitus.   Eyes: Negative.   Respiratory: Negative.  Negative for stridor.   Cardiovascular: Negative.   Gastrointestinal: Positive for nausea. Genitourinary: Negative.   Musculoskeletal: Negative. Skin: Negative.   Neurological: Negative for dizziness, tingling, tremors, sensory change, speech change, focal weakness, seizures, headaches, loss of consciousness and weakness.  Endo/Heme/Allergies: Negative.   Psychiatric/Behavioral: Negative.   14 point review of systems was performed and is negative except as detailed under history of present illness and above  PHYSICAL EXAMINATION  ECOG PERFORMANCE STATUS: 2 - Symptomatic, <50% confined to bed  Vitals:   12/24/15 1337  BP: (!) 125/96  Pulse: (!) 123  Resp: 16  Temp: 98.4 F (36.9 C)   Physical Exam  Constitutional: She is oriented to person, place, and time and well-developed, and in no distress. In wheelchair Obese.  HENT:  Head: Normocephalic and atraumatic.  Nose: Nose normal.  Mouth/Throat: Oropharynx is clear and moist. No oropharyngeal exudate.  Eyes: Conjunctivae and EOM are normal. Pupils are equal, round, and reactive to light. Right eye exhibits no discharge. Left eye  exhibits no discharge. No scleral icterus.  Neck: Normal range of motion. Neck supple. No tracheal deviation present. No thyromegaly present.  Cardiovascular: Normal rate, regular rhythm and normal heart sounds.  Exam reveals no gallop and  no friction rub.   No murmur heard. Pulmonary/Chest: Effort normal and breath sounds normal. She has no wheezes. She has no rales.  Abdominal: Soft. Bowel sounds are normal. She exhibits no distension and no mass. There is no tenderness. There is no rebound and no guarding.  Musculoskeletal: Normal range of motion. She exhibits no edema.  Lymphadenopathy:    She has no cervical adenopathy.  Neurological: She is alert and oriented to person, place, and time.No cranial nerve deficit.  Coordination normal.  Skin: Skin is warm and dry. No rash noted.  Psychiatric: Mood, memory, affect and judgment normal.  Nursing note and vitals reviewed.   LABORATORY DATA: I have reviewed the results below as listed.    CBC    Component Value Date/Time   WBC 8.0 12/21/2015 0938   RBC 4.26 12/21/2015 0938   HGB 11.8 (L) 12/21/2015 0938   HCT 37.3 12/21/2015 0938   PLT 266 12/21/2015 0938   MCV 87.6 12/21/2015 0938   MCH 27.7 12/21/2015 0938   MCHC 31.6 12/21/2015 0938   RDW 18.2 (H) 12/21/2015 0938   LYMPHSABS 2.3 12/21/2015 0938   MONOABS 0.7 12/21/2015 0938   EOSABS 0.2 12/21/2015 0938   BASOSABS 0.0 12/21/2015 0938   CMP     Component Value Date/Time   NA 135 12/21/2015 0938   K 4.3 12/21/2015 0938   CL 101 12/21/2015 0938   CO2 26 12/21/2015 0938   GLUCOSE 183 (H) 12/21/2015 0938   BUN 19 12/21/2015 0938   CREATININE 1.21 (H) 12/21/2015 0938   CALCIUM 9.0 12/21/2015 0938   PROT 7.6 12/21/2015 0938   ALBUMIN 3.2 (L) 12/21/2015 0938   AST 53 (H) 12/21/2015 0938   ALT 37 12/21/2015 0938   ALKPHOS 273 (H) 12/21/2015 0938   BILITOT 0.5 12/21/2015 0938   GFRNONAA 47 (L) 12/21/2015 0938   GFRAA 54 (L) 12/21/2015 0938    RADIOLOGY  Mr Brain W Wo  Contrast  Result Date: 12/21/2015 CLINICAL DATA:  Dizziness and feeling of loss of consciousness. Stage IV breast cancer. Evaluate metastatic disease. EXAM: MRI HEAD WITHOUT AND WITH CONTRAST TECHNIQUE: Multiplanar, multiecho pulse sequences of the brain and surrounding structures were obtained without and with intravenous contrast. CONTRAST:  36m MULTIHANCE GADOBENATE DIMEGLUMINE 529 MG/ML IV SOLN COMPARISON:  PET-CT 01/30/2015 FINDINGS: Brain: No enhancement or edema to suggest metastatic disease. Left frontal developmental venous anomaly. Patchy FLAIR and T2 hyperintensity in the pons and to a lesser degree cerebral white matter attributed to chronic microvascular disease in this patient with vascular risk factors. Vascular: Normal flow voids. Left frontal developmental venous anomaly. Skull and upper cervical spine: Heterogeneous marrow from known osseous metastatic disease. Osseous metastases discussed on 01/30/2015 PET-CT. Sinuses/Orbits: Small proteinaceous Thornwaldt cyst. IMPRESSION: 1. Motion degraded study without acute finding or evidence of intracranial metastatic disease. 2. Known osseous metastases. 3. 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 Electronically Signed   By: JMonte FantasiaM.D.   On: 12/21/2015 13:28    PATHOLOGY: PORT OF SURGICAL PATHOLOGY ADDITIONAL INFORMATION: FLUORESCENCE IN-SITU HYBRIDIZATION Results: HER2 - NEGATIVE RATIO OF HER2/CEP17 SIGNALS 1.18 AVERAGE HER2 COPY NUMBER PER CELL 1.95 Reference Range: NEGATIVE HER2/CEP17 Ratio <2.0 and average HER2 copy number <4.0 EQUIVOCAL HER2/CEP17 Ratio <2.0 and average HER2 copy number >=4.0 and <6.0 POSITIVE HER2/CEP17 Ratio >=2.0 or <2.0 and average HER2 copy number >=6.0 JEnid CutterMD Pathologist, Electronic Signature ( Signed 10/07/2014) PROGNOSTIC INDICATORS Results: IMMUNOHISTOCHEMICAL AND MORPHOMETRIC ANALYSIS PERFORMED MANUALLY Estrogen Receptor: 100%, POSITIVE, STRONG  STAINING  INTENSITY Progesterone Receptor: 90%, POSITIVE, STRONG STAINING INTENSITY REFERENCE RANGE ESTROGEN RECEPTOR NEGATIVE 0% POSITIVE =>1% REFERENCE RANGE PROGESTERONE RECEPTOR NEGATIVE 0% POSITIVE =>1% All controls stained appropriately 1 of 3 FINAL for Kaylee Mitchell, Kaylee Mitchell ([id number]) ADDITIONAL INFORMATION:(continued) Mali RUND DO Pathologist, Electronic Signature   ASSESSMENT and THERAPY PLAN:  Stage IV ER+ carcinoma of the breast with bone metastases Chronic Leg pain Low calcium on XGEVA Questionable drug diversion Liver metastases ER+ PR+ HER 2 neu - Afinitor, exemestane Mild pneumonitis on afinitor Anorexia on afinitor TAXOL Declining PS Situational depression  Discussed goals of care with the patient.Her performance status concerns me. Although her liver function has improved, she is not tolerating therapy well. We discussed taking time off from therapy during the holidays and reconvening after Christmas to discuss restarting therapy. We will also consider reducing her dose at that time.   I recommend she bring her medications with her in follow up visit so we can go through them together and avoid future confusion. I also recommended she get a pill box to organize daily pills. Kaylee Mitchell may have been making herself sick by taking too much Cymbalta.  I encouraged her to continue to moisturize her hands and feet   I have refilled Zofran.  Kaylee Mitchell will RTC in two weeks.  Orders Placed This Encounter  Procedures  . CBC with Differential    Standing Status:   Future    Number of Occurrences:   1    Standing Expiration Date:   1December 13, 2018  . Comprehensive metabolic panel    Standing Status:   Future    Number of Occurrences:   1    Standing Expiration Date:   1December 13, 2018   Meds ordered this encounter  Medications  . DISCONTD: ondansetron (ZOFRAN) 8 MG tablet    Sig: Take 1 tablet (8 mg total) by mouth every 8 (eight) hours as needed for nausea or vomiting.    Dispense:  30  tablet    Refill:  1     All questions were answered. The patient knows to call the clinic with any problems, questions or concerns. We can certainly see the patient much sooner if necessary.    This document serves as a record of services personally performed by Ancil Linsey, MD. It was created on her behalf by Elmyra Ricks, a trained medical scribe. The creation of this record is based on the scribe's personal observations and the provider's statements to them. This document has been checked and approved by the attending provider.  I have reviewed the above documentation for accuracy and completeness, and I agree with the above.  This note was electronically signed.  Kelby Fam. Whitney Muse, MD

## 2015-12-25 MED ORDER — ONDANSETRON HCL 8 MG PO TABS: 8.0000 mg | ORAL_TABLET | Freq: Three times a day (TID) | ORAL | 1 refills | Status: DC | PRN

## 2015-12-27 ENCOUNTER — Other Ambulatory Visit (HOSPITAL_COMMUNITY): Payer: Self-pay | Admitting: Oncology

## 2015-12-28 ENCOUNTER — Encounter (HOSPITAL_BASED_OUTPATIENT_CLINIC_OR_DEPARTMENT_OTHER): Payer: Medicare Other

## 2015-12-28 ENCOUNTER — Other Ambulatory Visit (HOSPITAL_COMMUNITY): Payer: Medicare Other

## 2015-12-28 ENCOUNTER — Encounter (HOSPITAL_COMMUNITY): Payer: Self-pay

## 2015-12-28 VITALS — BP 150/85 | HR 62 | Temp 97.8°F | Resp 16

## 2015-12-28 DIAGNOSIS — C50919 Malignant neoplasm of unspecified site of unspecified female breast: Secondary | ICD-10-CM | POA: Diagnosis not present

## 2015-12-28 DIAGNOSIS — C7951 Secondary malignant neoplasm of bone: Secondary | ICD-10-CM | POA: Diagnosis present

## 2015-12-28 MED ORDER — DENOSUMAB 120 MG/1.7ML ~~LOC~~ SOLN
120.0000 mg | Freq: Once | SUBCUTANEOUS | Status: AC
Start: 2015-12-28 — End: 2015-12-28
  Administered 2015-12-28: 120 mg via SUBCUTANEOUS
  Filled 2015-12-28: qty 1.7

## 2015-12-28 NOTE — Patient Instructions (Signed)
[place] at [facility] Discharge Instructions  RECOMMENDATIONS MADE BY THE CONSULTANT AND ANY TEST RESULTS WILL BE SENT TO YOUR REFERRING PHYSICIAN.  You were given an Xgeva injection today.  Return as scheduled.   Thank you for choosing [place] at [facility] to provide your oncology and hematology care.  To afford each patient quality time with our provider, please arrive at least 15 minutes before your scheduled appointment time.   Beginning January 23rd 2017 lab work for the [facility] will be done in the  Main lab at [facility] on 1st floor. If you have a lab appointment with the [facility] please come in thru the  Main Entrance and check in at the main information desk  You need to re-schedule your appointment should you arrive 10 or more minutes late.  We strive to give you quality time with our providers, and arriving late affects you and other patients whose appointments are after yours.  Also, if you no show three or more times for appointments you may be dismissed from the clinic at the providers discretion.     Again, thank you for choosing [facility].  Our hope is that these requests will decrease the amount of time that you wait before being seen by our physicians.       _____________________________________________________________  Should you have questions after your visit to [facility], please contact our office at [phone] between the hours of 8:30 a.m. and 4:30 p.m.  Voicemails left after 4:30 p.m. will not be returned until the following business day.  For prescription refill requests, have your pharmacy contact our office.         Resources For Cancer Patients and their Caregivers ? American Cancer Society: Can assist with transportation, wigs, general needs, runs Look Good Feel Better.        8382104986 ? Cancer Care: Provides financial assistance, online support  groups, medication/co-pay assistance.  1-800-813-HOPE (252)200-8269) ? [place] Assists [place] cancer patients and their families through emotional , educational and financial support.  (636)236-4398 ? [place] DSS Where to apply for food stamps, Medicaid and utility assistance. 209-562-2214 ? RCATS: Transportation to medical appointments. 712-256-9480 ? Social Security Administration: May apply for disability if have a Stage IV cancer. 432-162-4167 4691234573 ? [employer], Disability and Transit Services: Assists with nutrition, care and transit needs. [place] Support Programs: @10RELATIVEDAYS @ > Cancer Support Group  2nd Tuesday of the month 1pm-2pm, Journey Room  > Creative Journey  3rd Tuesday of the month 1130am-1pm, Journey Room  > Look Good Feel Better  1st Wednesday of the month 10am-12 noon, Journey Room (Call [place] to register [phone])

## 2015-12-28 NOTE — Progress Notes (Signed)
Pt given Xgeva injection in lower left abdomen. Pt tolerated well. Pt stable and discharged home with family.

## 2015-12-31 DIAGNOSIS — E1142 Type 2 diabetes mellitus with diabetic polyneuropathy: Secondary | ICD-10-CM | POA: Diagnosis not present

## 2015-12-31 DIAGNOSIS — G620 Drug-induced polyneuropathy: Secondary | ICD-10-CM | POA: Diagnosis not present

## 2015-12-31 DIAGNOSIS — C787 Secondary malignant neoplasm of liver and intrahepatic bile duct: Secondary | ICD-10-CM | POA: Diagnosis not present

## 2015-12-31 DIAGNOSIS — I10 Essential (primary) hypertension: Secondary | ICD-10-CM | POA: Diagnosis not present

## 2015-12-31 DIAGNOSIS — C50911 Malignant neoplasm of unspecified site of right female breast: Secondary | ICD-10-CM | POA: Diagnosis not present

## 2015-12-31 DIAGNOSIS — C7951 Secondary malignant neoplasm of bone: Secondary | ICD-10-CM | POA: Diagnosis not present

## 2016-01-02 DIAGNOSIS — E1142 Type 2 diabetes mellitus with diabetic polyneuropathy: Secondary | ICD-10-CM | POA: Diagnosis not present

## 2016-01-02 DIAGNOSIS — G620 Drug-induced polyneuropathy: Secondary | ICD-10-CM | POA: Diagnosis not present

## 2016-01-02 DIAGNOSIS — I10 Essential (primary) hypertension: Secondary | ICD-10-CM | POA: Diagnosis not present

## 2016-01-02 DIAGNOSIS — C50911 Malignant neoplasm of unspecified site of right female breast: Secondary | ICD-10-CM | POA: Diagnosis not present

## 2016-01-02 DIAGNOSIS — C787 Secondary malignant neoplasm of liver and intrahepatic bile duct: Secondary | ICD-10-CM | POA: Diagnosis not present

## 2016-01-02 DIAGNOSIS — C7951 Secondary malignant neoplasm of bone: Secondary | ICD-10-CM | POA: Diagnosis not present

## 2016-01-04 DIAGNOSIS — E1042 Type 1 diabetes mellitus with diabetic polyneuropathy: Secondary | ICD-10-CM | POA: Diagnosis not present

## 2016-01-04 DIAGNOSIS — E1065 Type 1 diabetes mellitus with hyperglycemia: Secondary | ICD-10-CM | POA: Diagnosis not present

## 2016-01-04 DIAGNOSIS — I10 Essential (primary) hypertension: Secondary | ICD-10-CM | POA: Diagnosis not present

## 2016-01-04 DIAGNOSIS — E6609 Other obesity due to excess calories: Secondary | ICD-10-CM | POA: Diagnosis not present

## 2016-01-05 DIAGNOSIS — C50911 Malignant neoplasm of unspecified site of right female breast: Secondary | ICD-10-CM | POA: Diagnosis not present

## 2016-01-05 DIAGNOSIS — E1142 Type 2 diabetes mellitus with diabetic polyneuropathy: Secondary | ICD-10-CM | POA: Diagnosis not present

## 2016-01-05 DIAGNOSIS — C787 Secondary malignant neoplasm of liver and intrahepatic bile duct: Secondary | ICD-10-CM | POA: Diagnosis not present

## 2016-01-05 DIAGNOSIS — G620 Drug-induced polyneuropathy: Secondary | ICD-10-CM | POA: Diagnosis not present

## 2016-01-05 DIAGNOSIS — I10 Essential (primary) hypertension: Secondary | ICD-10-CM | POA: Diagnosis not present

## 2016-01-05 DIAGNOSIS — C7951 Secondary malignant neoplasm of bone: Secondary | ICD-10-CM | POA: Diagnosis not present

## 2016-01-06 ENCOUNTER — Telehealth (HOSPITAL_COMMUNITY): Payer: Self-pay | Admitting: *Deleted

## 2016-01-06 NOTE — Telephone Encounter (Signed)
Called to check on patient regarding her fall. Patient stated that she fell on Monday the 18th. Patient states she is ok, just sore. Informed patient to call us if she needs anything else.

## 2016-01-07 DIAGNOSIS — G620 Drug-induced polyneuropathy: Secondary | ICD-10-CM | POA: Diagnosis not present

## 2016-01-07 DIAGNOSIS — C50911 Malignant neoplasm of unspecified site of right female breast: Secondary | ICD-10-CM | POA: Diagnosis not present

## 2016-01-07 DIAGNOSIS — E1142 Type 2 diabetes mellitus with diabetic polyneuropathy: Secondary | ICD-10-CM | POA: Diagnosis not present

## 2016-01-07 DIAGNOSIS — I10 Essential (primary) hypertension: Secondary | ICD-10-CM | POA: Diagnosis not present

## 2016-01-07 DIAGNOSIS — C787 Secondary malignant neoplasm of liver and intrahepatic bile duct: Secondary | ICD-10-CM | POA: Diagnosis not present

## 2016-01-07 DIAGNOSIS — C7951 Secondary malignant neoplasm of bone: Secondary | ICD-10-CM | POA: Diagnosis not present

## 2016-01-13 ENCOUNTER — Encounter (HOSPITAL_COMMUNITY): Payer: Medicare Other

## 2016-01-13 ENCOUNTER — Encounter (HOSPITAL_BASED_OUTPATIENT_CLINIC_OR_DEPARTMENT_OTHER): Payer: Medicare Other | Admitting: Oncology

## 2016-01-13 ENCOUNTER — Encounter (HOSPITAL_COMMUNITY): Payer: Self-pay | Admitting: Oncology

## 2016-01-13 VITALS — BP 131/72 | HR 96 | Temp 98.5°F | Resp 16 | Ht 65.0 in | Wt 231.0 lb

## 2016-01-13 DIAGNOSIS — C50919 Malignant neoplasm of unspecified site of unspecified female breast: Secondary | ICD-10-CM | POA: Diagnosis not present

## 2016-01-13 DIAGNOSIS — E785 Hyperlipidemia, unspecified: Secondary | ICD-10-CM | POA: Diagnosis not present

## 2016-01-13 DIAGNOSIS — Z853 Personal history of malignant neoplasm of breast: Secondary | ICD-10-CM | POA: Diagnosis not present

## 2016-01-13 DIAGNOSIS — C787 Secondary malignant neoplasm of liver and intrahepatic bile duct: Secondary | ICD-10-CM

## 2016-01-13 DIAGNOSIS — C779 Secondary and unspecified malignant neoplasm of lymph node, unspecified: Secondary | ICD-10-CM | POA: Diagnosis not present

## 2016-01-13 DIAGNOSIS — R059 Cough, unspecified: Secondary | ICD-10-CM

## 2016-01-13 DIAGNOSIS — R000 Tachycardia, unspecified: Secondary | ICD-10-CM

## 2016-01-13 DIAGNOSIS — J00 Acute nasopharyngitis [common cold]: Secondary | ICD-10-CM | POA: Diagnosis not present

## 2016-01-13 DIAGNOSIS — E6609 Other obesity due to excess calories: Secondary | ICD-10-CM | POA: Diagnosis not present

## 2016-01-13 DIAGNOSIS — I10 Essential (primary) hypertension: Secondary | ICD-10-CM | POA: Diagnosis not present

## 2016-01-13 DIAGNOSIS — R05 Cough: Secondary | ICD-10-CM | POA: Diagnosis not present

## 2016-01-13 DIAGNOSIS — C7951 Secondary malignant neoplasm of bone: Secondary | ICD-10-CM

## 2016-01-13 DIAGNOSIS — E1065 Type 1 diabetes mellitus with hyperglycemia: Secondary | ICD-10-CM | POA: Diagnosis not present

## 2016-01-13 DIAGNOSIS — Z7189 Other specified counseling: Secondary | ICD-10-CM

## 2016-01-13 LAB — COMPREHENSIVE METABOLIC PANEL
ALBUMIN: 3.2 g/dL — AB (ref 3.5–5.0)
ALK PHOS: 277 U/L — AB (ref 38–126)
ALT: 74 U/L — ABNORMAL HIGH (ref 14–54)
ANION GAP: 7 (ref 5–15)
AST: 86 U/L — ABNORMAL HIGH (ref 15–41)
BILIRUBIN TOTAL: 0.7 mg/dL (ref 0.3–1.2)
BUN: 16 mg/dL (ref 6–20)
CALCIUM: 9.4 mg/dL (ref 8.9–10.3)
CO2: 24 mmol/L (ref 22–32)
CREATININE: 1.1 mg/dL — AB (ref 0.44–1.00)
Chloride: 103 mmol/L (ref 101–111)
GFR calc non Af Amer: 53 mL/min — ABNORMAL LOW (ref >60)
GLUCOSE: 365 mg/dL — AB (ref 65–99)
Potassium: 4.9 mmol/L (ref 3.5–5.1)
Sodium: 134 mmol/L — ABNORMAL LOW (ref 135–145)
TOTAL PROTEIN: 7.5 g/dL (ref 6.5–8.1)

## 2016-01-13 LAB — CBC WITH DIFFERENTIAL/PLATELET
Basophils Absolute: 0 10*3/uL (ref 0.0–0.1)
Basophils Relative: 1 %
Eosinophils Absolute: 0.1 10*3/uL (ref 0.0–0.7)
Eosinophils Relative: 1 %
HEMATOCRIT: 38.2 % (ref 36.0–46.0)
HEMOGLOBIN: 12.3 g/dL (ref 12.0–15.0)
LYMPHS ABS: 1.8 10*3/uL (ref 0.7–4.0)
Lymphocytes Relative: 28 %
MCH: 28.9 pg (ref 26.0–34.0)
MCHC: 32.2 g/dL (ref 30.0–36.0)
MCV: 89.9 fL (ref 78.0–100.0)
MONOS PCT: 6 %
Monocytes Absolute: 0.4 10*3/uL (ref 0.1–1.0)
NEUTROS ABS: 4.1 10*3/uL (ref 1.7–7.7)
NEUTROS PCT: 64 %
Platelets: 190 10*3/uL (ref 150–400)
RBC: 4.25 MIL/uL (ref 3.87–5.11)
RDW: 17.4 % — ABNORMAL HIGH (ref 11.5–15.5)
WBC: 6.4 10*3/uL (ref 4.0–10.5)

## 2016-01-13 MED ORDER — AZITHROMYCIN 250 MG PO TABS: ORAL_TABLET | ORAL | 0 refills | Status: DC

## 2016-01-13 MED ORDER — OXYCODONE HCL 10 MG PO TABS: ORAL_TABLET | ORAL | 0 refills | Status: DC

## 2016-01-13 MED ORDER — BENZONATATE 200 MG PO CAPS: 200.0000 mg | ORAL_CAPSULE | Freq: Three times a day (TID) | ORAL | 0 refills | Status: DC | PRN

## 2016-01-13 NOTE — Progress Notes (Signed)
[name], MD [place] [address]  Invasive ductal carcinoma of breast, unspecified laterality ([place]) - Plan: Oxycodone HCl 10 MG TABS, CBC with Differential, Comprehensive metabolic panel  Bone metastasis ([place]) - Plan: Oxycodone HCl 10 MG TABS  Infiltrating ductal carcinoma of breast, unspecified laterality ([place]) - Plan: Oxycodone HCl 10 MG TABS  Tachycardia - Plan: Oxycodone HCl 10 MG TABS  Goals of care, counseling/discussion  Acute nasopharyngitis - Plan: azithromycin (ZITHROMAX) 250 MG tablet  Cough - Plan: benzonatate (TESSALON) 200 MG capsule  CURRENT THERAPY: Xeloda on HOLD  INTERVAL HISTORY: Kaylee Mitchell 60 y.o. female returns for followup of Stage IV breast cancer, ER/PR+, HER2 neg, with new hepatic metastasis (biopsy proven).      Invasive ductal carcinoma of breast ([place])   05/26/2009 Initial Diagnosis    Needle core biopsy showed invasive mammary carcinoma with 1 + lymph node      06/03/2009 Surgery    Right modified radical mastectomy with 8/8 + lymph nodes by Dr. Ronnell Freshwater      07/22/2009 Cancer Staging    Bone scan + for multiple osseous lesions.  No disease elsewhere.      07/24/2009 - 03/23/2010 Chemotherapy    Arimidex      03/23/2010 Progression         03/23/2010 - 12/05/2012 Chemotherapy    Tamoxifen      11/28/2012 Progression    Worsening of lumbar spine osseous disease      12/06/2012 - 09/25/2014 Chemotherapy    Fulvestrant + Exemestane      01/24/2014 PET scan    No evidence of hypermetabolic recurrent or metastatic disease. Widespread osseous metastasis, without significant marrow hypermetabolism.      09/18/2014 Progression    PET- New hepatic metastasis within the LEFT hepatic lobe involving segments IVA, IVB, and III.      10/20/2014 - 11/13/2014 Chemotherapy    Afinitor + Exemestane      11/13/2014 Adverse Reaction    Increasing fatigue      11/13/2014 Treatment Plan Change    Hold  Afinitor, will reduce dose to 7.5 mg daily.  Continue Exemestane and Xgeva.      11/24/2014 - 01/16/2015 Chemotherapy    Afinitor 7.5 mg daily, Exemestane daily, and continuation of Xgeva.      01/16/2015 Adverse Reaction    Cough, ?Afinitor-induced      01/16/2015 - 02/02/2015 Chemotherapy    HOLD AFFINITOR secondary to cough, weight loss, Continue EXEMESTANE and Xgeva      01/30/2015 PET scan    Significant partial metabolic treatment response in liver metastases, nonenlarged R level 2 cervical LN, non specific, stable sclerotic osseous metastases throughout skeleton      02/02/2015 Adverse Reaction    Weight loss, decreased appetite      02/02/2015 Treatment Plan Change    Afinitor dose further reduced to 5 mg.      02/02/2015 -  Chemotherapy    Afinitor 5 mg and Exemestane with continuation of Xgeva      06/03/2015 Imaging    CT CAP- Hepatic metastatic disease appears to have improved slightly in the interval from 01/30/2015. Diffuse osseous metastatic disease.      08/21/2015 Imaging    CT CAP- Stable CT of the chest. No evidence for metastatic disease in the thorax. Overall, no substantial change in pre-existing liver lesions. There may be a new 10 mm lesion in the posterior right  dome of liver, not seen previously.       09/30/2015 Imaging    MRI liver- 1. Innumerable (> than 30) confluent liver metastases throughout the liver, significantly better visualized at MRI compared to prior CT and PET-CT studies, which limits comparison. By best estimate, there has been progression of the liver metastases compared to the 06/03/2015 CT study. 2. Patchy lesions throughout the thoracolumbar spine, correlating with the known extensive sclerotic osseous metastatic disease as seen on multiple prior CT studies, not appreciably changed.      09/30/2015 Progression    MRI of liver demonstrates significant progression of disease and climbing CA 27.29 and CA 15-3.      10/09/2015  Procedure    Port placement by Dr. Arnoldo Morale      10/13/2015 - 11/17/2015 Chemotherapy    The patient had PACLitaxel (TAXOL) 138 mg in dextrose 5 % 250 mL chemo infusion (Dose modification: 60 mg/m2 (original dose 80 mg/m2, Cycle 1, Reason: Change in LFTs), 60 mg/m2 (original dose 80 mg/m2, Cycle 1, Reason: Change in LFTs)  for chemotherapy treatment.        11/24/2015 Treatment Plan Change    Treatment deferred x 1 week.      12/01/2015 Adverse Reaction    Paclitaxel-induced peripheral neuropathy      12/09/2015 Treatment Plan Change    Change to Xeloda 1000 mg/2 BID 7 days on and 7 days off.      12/21/2015 Imaging    MRI brain- 1. Motion degraded study without acute finding or evidence of intracranial metastatic disease. 2. Known osseous metastases. 3. 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      12/21/2015 Treatment Plan Change    Xeloda put on hold from 12/21/15- 01/25/2016       "I'm trying to come down with a cold, Tom."  She notes about a 48 hour history of sore throat with cough.  She denies a productive cough.  She denies any fevers.  She does not have nasal discharge.  She notes that the cough keeps her up at night.  She admits to not feeling better since stopping Xeloda on 12/21/2015.  Her performance status has not improved.  She reports seeing Dr. Karie Kirks recently (about 1 week ago) and he cut back on her BP medications for her near LOC.  She reports that this has not helped either.  Weight is stable.  Appetite is poor.  I have advised the patient that our goal, from a cancer standpoint, is to improve QOL.  She is reminded of the terminality of her cancer and that it is not curable.  Her QOL and performance status has been declining over the past 6 months and therefore, we must re-evaluate our goals and her goals.  We will plan on doing this when she returns for follow-up if not better.  Review of Systems  Constitutional: Positive for  malaise/fatigue. Negative for chills, fever and weight loss.  HENT: Negative.   Eyes: Negative.  Negative for double vision.  Respiratory: Negative for cough.   Cardiovascular: Negative.  Negative for chest pain.  Gastrointestinal: Negative.  Negative for constipation, diarrhea, nausea and vomiting.  Genitourinary: Negative.  Negative for dysuria.  Musculoskeletal: Positive for falls and joint pain (right knee pain).  Skin: Negative.   Neurological: Positive for dizziness and weakness. Negative for loss of consciousness.  Endo/Heme/Allergies: Negative.   Psychiatric/Behavioral: Negative.     Past Medical History:  Diagnosis Date  . Arthritis   .  Bone metastasis ([place]) 09/13/2010  . Cancer ([place])    rt breast is primary  . Depression   . Depression 12/14/2011  . Diabetes mellitus    type II  . DNR (do not resuscitate) 11/05/2014  . GERD (gastroesophageal reflux disease)   . High cholesterol   . Hypertension   . Invasive ductal carcinoma of breast ([place]) 09/13/2010  . Knee pain, bilateral 2013  . Opioid contract exists with [facility] 10/24/2014   Contract found in Chart review in letter tab.    Past Surgical History:  Procedure Laterality Date  . ABDOMINAL HYSTERECTOMY    . KNEE SURGERY Bilateral   . MASTECTOMY     right side  . PORTACATH PLACEMENT  10/09/2015   Procedure: INSERTION PORT-A-CATH LEFT SUBCLAVIAN;  Surgeon: Aviva Signs, MD;  Location: [facility];  Service: General;;    Family History  Problem Relation Age of Onset  . Diabetes Mother   . Diabetes Father   . Arthritis    . Cancer    . Diabetes    . Cancer Maternal Aunt   . Anesthesia problems Neg Hx   . Hypotension Neg Hx   . Malignant hyperthermia Neg Hx   . Pseudochol deficiency Neg Hx     Social History   Social History  . Marital status: Single    Spouse name: N/A  . Number of children: N/A  . Years of education: 28   Occupational History  . disabled Unemployed   Social History  Main Topics  . Smoking status: Never Smoker  . Smokeless tobacco: Never Used  . Alcohol use No  . Drug use: No  . Sexual activity: Yes    Birth control/ protection: Surgical   Other Topics Concern  . None   Social History Narrative  . None     PHYSICAL EXAMINATION  ECOG PERFORMANCE STATUS: 2 - Symptomatic, <50% confined to bed  Vitals:   01/13/16 1423  BP: 131/72  Pulse: 96  Resp: 16  Temp: 98.5 F (36.9 C)    GENERAL:alert, no distress, comfortable, cooperative, obese and accompanied by sister, in wheelchair, mask over face SKIN: skin color, texture, turgor are normal, no rashes or significant lesions HEAD: Normocephalic, No masses, lesions, tenderness or abnormalities EYES: normal, Conjunctiva are pink and non-injected EARS: External ears normal OROPHARYNX:mucous membranes are moist  NECK: supple LYMPH:  not examined BREAST:not examined LUNGS: clear to auscultation  HEART: regular rate & rhythm ABDOMEN:obese and normal bowel sounds BACK: Back symmetric, no curvature. EXTREMITIES:less then 2 second capillary refill, no joint deformities, effusion, or inflammation, no edema, no skin discoloration, no cyanosis  NEURO: alert & oriented x 3 with fluent speech   LABORATORY DATA: CBC    Component Value Date/Time   WBC 6.4 01/13/2016 1317   RBC 4.25 01/13/2016 1317   HGB 12.3 01/13/2016 1317   HCT 38.2 01/13/2016 1317   PLT 190 01/13/2016 1317   MCV 89.9 01/13/2016 1317   MCH 28.9 01/13/2016 1317   MCHC 32.2 01/13/2016 1317   RDW 17.4 (H) 01/13/2016 1317   LYMPHSABS 1.8 01/13/2016 1317   MONOABS 0.4 01/13/2016 1317   EOSABS 0.1 01/13/2016 1317   BASOSABS 0.0 01/13/2016 1317      Chemistry      Component Value Date/Time   NA 134 (L) 01/13/2016 1317   K 4.9 01/13/2016 1317   CL 103 01/13/2016 1317   CO2 24 01/13/2016 1317   BUN 16 01/13/2016 1317   CREATININE  1.10 (H) 01/13/2016 1317      Component Value Date/Time   CALCIUM 9.4 01/13/2016 1317    ALKPHOS 277 (H) 01/13/2016 1317   AST 86 (H) 01/13/2016 1317   ALT 74 (H) 01/13/2016 1317   BILITOT 0.7 01/13/2016 1317        PENDING LABS:   RADIOGRAPHIC STUDIES:  Mr Jeri Cos GM Contrast  Result Date: 12/21/2015 CLINICAL DATA:  Dizziness and feeling of loss of consciousness. Stage IV breast cancer. Evaluate metastatic disease. EXAM: MRI HEAD WITHOUT AND WITH CONTRAST TECHNIQUE: Multiplanar, multiecho pulse sequences of the brain and surrounding structures were obtained without and with intravenous contrast. CONTRAST:  66m MULTIHANCE GADOBENATE DIMEGLUMINE 529 MG/ML IV SOLN COMPARISON:  PET-CT 01/30/2015 FINDINGS: Brain: No enhancement or edema to suggest metastatic disease. Left frontal developmental venous anomaly. Patchy FLAIR and T2 hyperintensity in the pons and to a lesser degree cerebral white matter attributed to chronic microvascular disease in this patient with vascular risk factors. Vascular: Normal flow voids. Left frontal developmental venous anomaly. Skull and upper cervical spine: Heterogeneous marrow from known osseous metastatic disease. Osseous metastases discussed on 01/30/2015 PET-CT. Sinuses/Orbits: Small proteinaceous Thornwaldt cyst. IMPRESSION: 1. Motion degraded study without acute finding or evidence of intracranial metastatic disease. 2. Known osseous metastases. 3. 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 Electronically Signed   By: JMonte FantasiaM.D.   On: 12/21/2015 13:28     PATHOLOGY:    ASSESSMENT AND PLAN:  [place] Breast cancer with bone involvement requiring Xgeva.  Now on Xeloda 7 days on and 7 days off, but this was put on HOLD on 12/24/2015 due to concerns regarding tolerability and performance status.  Oncology history is updated.  Labs today: CBC diff, CMET.  I personally reviewed and went over laboratory results with the patient.  The results are noted within this dictation.  AST/ALT  mildly elevated compared to 3 weeks ago.  Hyperglycemia again noted.  On her last encounter, she was advised to bring in all of her medications with her for today's encounter due to patient confusion regarding medications and concerns for her taking the incorrect medications and/or too much of some medications.  She DID NOT bring her medications with her today and therefore, I cannot check what she is doing with regards to her medications.  [facility]  I have refilled her pain medication.  DO NOT RESUSCITATE.  DNR form filled out and provided to the patient on 10/24/2014.  She saw Dr. KKarie Kirks Primary Care Provider recently who cut back on one of her BP medications by 50%.  He was concerned that she was getting dizzy and feeling like she was about to LOC due to hypotension.  She notes that she has been one her new anti-hypertensive regimen x 1 week without improvement..  She has a follow-up appointment with Dr. KKarie Kirkson 01/22/16.  She has been off Xeloda x 3 weeks without a significant improvement in her QOL and performance status.  Her performance status is of major concern and I am afraid that we may be getting closer to Hospice.  She will HOLD Xeloda until she returns after her appointment with her primary care provider.  I have a frank conversation with the patient about her goals of care.  She is advised that we would have hoped that she would be better off now compared to when she was on IV chemotherapy.  From  a performance status standpoint, she is not.    She does have a URI and therefore, I will give her an Rx for Z-Pak and tessalon for her cough.  Labs in ~ 2 weeks: CBC diff, CMET.  Return in 2 weeks for follow-up.  If patient's performance status is not improved, then discussion regarding Hospice will take place.  I would like to hear if Dr. Karie Kirks has any input regarding this as well. I feel as though she has declined significantly over the  past 6 months.  She is advised to bring her medication with her to her next appointment.  Bone metastasis ([place]) Continue Xgeva, calcium, vitamin D  Next Xgeva injection is due in 2 weeks   ORDERS PLACED FOR THIS ENCOUNTER: Orders Placed This Encounter  Procedures  . CBC with Differential  . Comprehensive metabolic panel    MEDICATIONS PRESCRIBED THIS ENCOUNTER: Meds ordered this encounter  Medications  . DULoxetine (CYMBALTA) 30 MG capsule  . Oxycodone HCl 10 MG TABS    Sig: Take 1 tablet every 4 hours as needed for pain.    Dispense:  100 tablet    Refill:  0    Order Specific Question:   Supervising Provider    Answer:   Patrici Ranks [id number]  . azithromycin (ZITHROMAX) 250 MG tablet    Sig: Take 2 tablets PO today and 1 tablet daily thereafter until Rx is complete.    Dispense:  6 each    Refill:  0    Order Specific Question:   Supervising Provider    Answer:   Patrici Ranks [id number]  . benzonatate (TESSALON) 200 MG capsule    Sig: Take 1 capsule (200 mg total) by mouth 3 (three) times daily as needed for cough.    Dispense:  30 capsule    Refill:  0    Order Specific Question:   Supervising Provider    Answer:   Patrici Ranks [id number]    THERAPY PLAN:  HOLD Xeloda, will re-address at follow-up appointment.  All questions were answered. The patient knows to call the clinic with any problems, questions or concerns. We can certainly see the patient much sooner if necessary.  Patient and plan discussed with Dr. Ancil Linsey and she is in agreement with the aforementioned.   This note is electronically signed by: Doy Mince 01/13/2016 3:33 PM

## 2016-01-13 NOTE — Assessment & Plan Note (Signed)
Continue Xgeva, calcium, vitamin D  Next Xgeva injection is due in 2 weeks

## 2016-01-13 NOTE — Assessment & Plan Note (Addendum)
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  Now on Xeloda 7 days on and 7 days off, but this was put on HOLD on 12/24/2015 due to concerns regarding tolerability and performance status.  Oncology history is updated.  Labs today: CBC diff, CMET.  I personally reviewed and went over laboratory results with the patient.  The results are noted within this dictation.  AST/ALT mildly elevated compared to 3 weeks ago.  Hyperglycemia again noted.  On her last encounter, she was advised to bring in all of her medications with her for today's encounter due to patient confusion regarding medications and concerns for her taking the incorrect medications and/or too much of some medications.  She DID NOT bring her medications with her today and therefore, I cannot check what she is doing with regards to her medications.  Pain contract in place with [facility].  I have refilled her pain medication.  DO NOT RESUSCITATE.  DNR form filled out and provided to the patient on 10/24/2014.  She saw Dr. Karie Kirks, Primary Care Provider recently who cut back on one of her BP medications by 50%.  He was concerned that she was getting dizzy and feeling like she was about to LOC due to hypotension.  She notes that she has been one her new anti-hypertensive regimen x 1 week without improvement..  She has a follow-up appointment with Dr. Karie Kirks on 01/22/16.  She has been off Xeloda x 3 weeks without a significant improvement in her QOL and performance status.  Her performance status is of major concern and I am afraid that we may be getting closer to Hospice.  She will HOLD Xeloda until she returns after her appointment with her primary care provider.  I have a frank conversation with the patient about her goals of care.  She is advised that we would have hoped that she would be better off now compared to when she was on IV chemotherapy.  From a performance status standpoint, she is not.    She  does have a URI and therefore, I will give her an Rx for Z-Pak and tessalon for her cough.  Labs in ~ 2 weeks: CBC diff, CMET.  Return in 2 weeks for follow-up.  If patient's performance status is not improved, then discussion regarding Hospice will take place.  I would like to hear if Dr. Karie Kirks has any input regarding this as well. I feel as though she has declined significantly over the past 6 months.  She is advised to bring her medication with her to her next appointment.

## 2016-01-13 NOTE — Patient Instructions (Addendum)
[employer] at [facility] Discharge Instructions  RECOMMENDATIONS MADE BY THE CONSULTANT AND ANY TEST RESULTS WILL BE SENT TO YOUR REFERRING PHYSICIAN.  You were seen today by Kirby Crigler PA-C. Try OTC cold medicine. Hold Xeloda Follow up with PCP as directed. Follow up with Tom on 01/25/16 BRING IN ALL YOUR MEDICINES!!   Thank you for choosing [place] at [facility] to provide your oncology and hematology care.  To afford each patient quality time with our provider, please arrive at least 15 minutes before your scheduled appointment time.   Beginning January 23rd 2017 lab work for the [facility] will be done in the  Main lab at [facility] on 1st floor. If you have a lab appointment with the [place] [facility] please come in thru the  Main Entrance and check in at the main information desk  You need to re-schedule your appointment should you arrive 10 or more minutes late.  We strive to give you quality time with our providers, and arriving late affects you and other patients whose appointments are after yours.  Also, if you no show three or more times for appointments you may be dismissed from the clinic at the providers discretion.     Again, thank you for choosing [facility].  Our hope is that these requests will decrease the amount of time that you wait before being seen by our physicians.       _____________________________________________________________  Should you have questions after your visit to [facility], please contact our office at [phone] between the hours of 8:30 a.m. and 4:30 p.m.  Voicemails left after 4:30 p.m. will not be returned until the following business day.  For prescription refill requests, have your pharmacy contact our office.         Resources For Cancer Patients and their Caregivers ? American Cancer Society: Can assist with transportation, wigs, general needs, runs Look  Good Feel Better.        512-629-2109 ? Cancer Care: Provides financial assistance, online support groups, medication/co-pay assistance.  1-800-813-HOPE 805-757-2088) ? [place] Assists [place] cancer patients and their families through emotional , educational and financial support.  365-438-0689 ? [place] DSS Where to apply for food stamps, Medicaid and utility assistance. 971-614-3736 ? RCATS: Transportation to medical appointments. 279-742-4360 ? Social Security Administration: May apply for disability if have a Stage IV cancer. 205-337-1766 (770) 302-2699 ? [employer], Disability and Transit Services: Assists with nutrition, care and transit needs. [place] Support Programs: @10RELATIVEDAYS @ > Cancer Support Group  2nd Tuesday of the month 1pm-2pm, Journey Room  > Creative Journey  3rd Tuesday of the month 1130am-1pm, Journey Room  > Look Good Feel Better  1st Wednesday of the month 10am-12 noon, Journey Room (Call [place] to register [phone])

## 2016-01-19 ENCOUNTER — Other Ambulatory Visit (HOSPITAL_COMMUNITY): Payer: Self-pay | Admitting: *Deleted

## 2016-01-19 DIAGNOSIS — C50919 Malignant neoplasm of unspecified site of unspecified female breast: Secondary | ICD-10-CM

## 2016-01-19 DIAGNOSIS — R638 Other symptoms and signs concerning food and fluid intake: Secondary | ICD-10-CM | POA: Insufficient documentation

## 2016-01-19 MED ORDER — SODIUM CHLORIDE 0.9 % IV SOLN: INTRAVENOUS | Status: AC

## 2016-01-20 ENCOUNTER — Encounter (HOSPITAL_COMMUNITY): Payer: Self-pay

## 2016-01-20 ENCOUNTER — Encounter (HOSPITAL_COMMUNITY): Payer: Medicare Other | Attending: Hematology & Oncology

## 2016-01-20 VITALS — BP 113/67 | HR 102 | Temp 97.8°F | Resp 18 | Wt 221.0 lb

## 2016-01-20 DIAGNOSIS — C7951 Secondary malignant neoplasm of bone: Secondary | ICD-10-CM | POA: Insufficient documentation

## 2016-01-20 DIAGNOSIS — C50919 Malignant neoplasm of unspecified site of unspecified female breast: Secondary | ICD-10-CM

## 2016-01-20 DIAGNOSIS — Z95828 Presence of other vascular implants and grafts: Secondary | ICD-10-CM

## 2016-01-20 DIAGNOSIS — R638 Other symptoms and signs concerning food and fluid intake: Secondary | ICD-10-CM

## 2016-01-20 LAB — CBC WITH DIFFERENTIAL/PLATELET
Basophils Absolute: 0.1 10*3/uL (ref 0.0–0.1)
Basophils Relative: 1 %
Eosinophils Absolute: 0.1 10*3/uL (ref 0.0–0.7)
Eosinophils Relative: 1 %
HEMATOCRIT: 38.5 % (ref 36.0–46.0)
Hemoglobin: 12.2 g/dL (ref 12.0–15.0)
LYMPHS PCT: 40 %
Lymphs Abs: 2.4 10*3/uL (ref 0.7–4.0)
MCH: 28.8 pg (ref 26.0–34.0)
MCHC: 31.7 g/dL (ref 30.0–36.0)
MCV: 91 fL (ref 78.0–100.0)
MONO ABS: 0.5 10*3/uL (ref 0.1–1.0)
MONOS PCT: 8 %
NEUTROS ABS: 3 10*3/uL (ref 1.7–7.7)
Neutrophils Relative %: 50 %
Platelets: 167 10*3/uL (ref 150–400)
RBC: 4.23 MIL/uL (ref 3.87–5.11)
RDW: 16.8 % — AB (ref 11.5–15.5)
WBC: 6 10*3/uL (ref 4.0–10.5)

## 2016-01-20 LAB — COMPREHENSIVE METABOLIC PANEL
ALT: 64 U/L — ABNORMAL HIGH (ref 14–54)
ANION GAP: 8 (ref 5–15)
AST: 98 U/L — ABNORMAL HIGH (ref 15–41)
Albumin: 3 g/dL — ABNORMAL LOW (ref 3.5–5.0)
Alkaline Phosphatase: 257 U/L — ABNORMAL HIGH (ref 38–126)
BILIRUBIN TOTAL: 0.8 mg/dL (ref 0.3–1.2)
BUN: 18 mg/dL (ref 6–20)
CO2: 28 mmol/L (ref 22–32)
Calcium: 8.7 mg/dL — ABNORMAL LOW (ref 8.9–10.3)
Chloride: 101 mmol/L (ref 101–111)
Creatinine, Ser: 1.15 mg/dL — ABNORMAL HIGH (ref 0.44–1.00)
GFR, EST AFRICAN AMERICAN: 58 mL/min — AB (ref >60)
GFR, EST NON AFRICAN AMERICAN: 50 mL/min — AB (ref >60)
Glucose, Bld: 161 mg/dL — ABNORMAL HIGH (ref 65–99)
POTASSIUM: 3.8 mmol/L (ref 3.5–5.1)
Sodium: 137 mmol/L (ref 135–145)
TOTAL PROTEIN: 6.9 g/dL (ref 6.5–8.1)

## 2016-01-20 MED ORDER — AMOXICILLIN-POT CLAVULANATE 875-125 MG PO TABS: 1.0000 | ORAL_TABLET | Freq: Two times a day (BID) | ORAL | 0 refills | Status: AC

## 2016-01-20 MED ORDER — SODIUM CHLORIDE 0.9 % IV SOLN
INTRAVENOUS | Status: DC
  Administered 2016-01-20: 09:00:00 via INTRAVENOUS

## 2016-01-20 MED ORDER — LORAZEPAM 2 MG/ML IJ SOLN
0.5000 mg | Freq: Once | INTRAMUSCULAR | Status: AC
  Administered 2016-01-20: 0.5 mg via INTRAVENOUS
  Filled 2016-01-20: qty 1

## 2016-01-20 MED ORDER — HEPARIN SOD (PORK) LOCK FLUSH 100 UNIT/ML IV SOLN
500.0000 [IU] | Freq: Once | INTRAVENOUS | Status: AC
  Administered 2016-01-20: 500 [IU] via INTRAVENOUS
  Filled 2016-01-20: qty 5

## 2016-01-20 MED ORDER — ONDANSETRON 8 MG PO TBDP
8.0000 mg | ORAL_TABLET | Freq: Once | ORAL | Status: AC
  Administered 2016-01-20: 8 mg via ORAL
  Filled 2016-01-20: qty 1

## 2016-01-20 NOTE — Patient Instructions (Signed)
[place] at [facility] Discharge Instructions  RECOMMENDATIONS MADE BY THE CONSULTANT AND ANY TEST RESULTS WILL BE SENT TO YOUR REFERRING PHYSICIAN.  IV fluids and medicines for nausea given today. Prescription for antibiotic (Augmentin) called into your pharmacy. Return as scheduled for office visit.   Thank you for choosing [place] at [facility] to provide your oncology and hematology care.  To afford each patient quality time with our provider, please arrive at least 15 minutes before your scheduled appointment time.    If you have a lab appointment with the [facility] please come in thru the  Main Entrance and check in at the main information desk  You need to re-schedule your appointment should you arrive 10 or more minutes late.  We strive to give you quality time with our providers, and arriving late affects you and other patients whose appointments are after yours.  Also, if you no show three or more times for appointments you may be dismissed from the clinic at the providers discretion.     Again, thank you for choosing [facility].  Our hope is that these requests will decrease the amount of time that you wait before being seen by our physicians.       _____________________________________________________________  Should you have questions after your visit to [facility], please contact our office at [phone] between the hours of 8:30 a.m. and 4:30 p.m.  Voicemails left after 4:30 p.m. will not be returned until the following business day.  For prescription refill requests, have your pharmacy contact our office.       Resources For Cancer Patients and their Caregivers ? American Cancer Society: Can assist with transportation, wigs, general needs, runs Look Good Feel Better.        212-578-7217 ? Cancer Care: Provides financial assistance, online support groups, medication/co-pay  assistance.  1-800-813-HOPE (631)298-4982) ? [facility] Assists [place] cancer patients and their families through emotional , educational and financial support.  (814)816-9196 ? [place] DSS Where to apply for food stamps, Medicaid and utility assistance. 249 820 7806 ? RCATS: Transportation to medical appointments. (782) 812-6817 ? Social Security Administration: May apply for disability if have a Stage IV cancer. (785) 079-4015 2567164354 ? [employer], Disability and Transit Services: Assists with nutrition, care and transit needs. [place] Support Programs: @10RELATIVEDAYS @ > Cancer Support Group  2nd Tuesday of the month 1pm-2pm, Journey Room  > Creative Journey  3rd Tuesday of the month 1130am-1pm, Journey Room  > Look Good Feel Better  1st Wednesday of the month 10am-12 noon, Journey Room (Call [place] to register [phone])

## 2016-01-20 NOTE — Progress Notes (Signed)
Pt c/o nausea and requesting medication for same.  Kirby Crigler, PA notified and orders rec'd.  Also, will hold Xgeva today per PA.   1030:  Pt reports nausea resolved.   Tolerated infusion well.  Alert, in no distress.  VSS.  Discharged via wheelchair in c/o family.

## 2016-01-22 ENCOUNTER — Ambulatory Visit (HOSPITAL_COMMUNITY)
Admission: RE | Admit: 2016-01-22 | Discharge: 2016-01-22 | Disposition: A | Discharge status: Home / Self Care | Payer: Medicare Other | Source: Ambulatory Visit | Attending: Family Medicine | Admitting: Family Medicine

## 2016-01-22 ENCOUNTER — Other Ambulatory Visit (HOSPITAL_COMMUNITY)
Admission: RE | Admit: 2016-01-22 | Discharge: 2016-01-22 | Disposition: A | Discharge status: Home / Self Care | Payer: Medicare Other | Source: Ambulatory Visit | Attending: Family Medicine | Admitting: Family Medicine

## 2016-01-22 ENCOUNTER — Other Ambulatory Visit (HOSPITAL_COMMUNITY): Payer: Self-pay | Admitting: Family Medicine

## 2016-01-22 DIAGNOSIS — J069 Acute upper respiratory infection, unspecified: Secondary | ICD-10-CM | POA: Insufficient documentation

## 2016-01-22 DIAGNOSIS — Z9011 Acquired absence of right breast and nipple: Secondary | ICD-10-CM | POA: Diagnosis not present

## 2016-01-22 DIAGNOSIS — C50911 Malignant neoplasm of unspecified site of right female breast: Secondary | ICD-10-CM | POA: Diagnosis present

## 2016-01-22 LAB — BASIC METABOLIC PANEL
Anion gap: 6 (ref 5–15)
BUN: 16 mg/dL (ref 6–20)
CHLORIDE: 106 mmol/L (ref 101–111)
CO2: 26 mmol/L (ref 22–32)
CREATININE: 1.17 mg/dL — AB (ref 0.44–1.00)
Calcium: 9.3 mg/dL (ref 8.9–10.3)
GFR calc Af Amer: 57 mL/min — ABNORMAL LOW (ref >60)
GFR calc non Af Amer: 49 mL/min — ABNORMAL LOW (ref >60)
GLUCOSE: 93 mg/dL (ref 65–99)
POTASSIUM: 4.3 mmol/L (ref 3.5–5.1)
SODIUM: 138 mmol/L (ref 135–145)

## 2016-01-22 LAB — CBC WITH DIFFERENTIAL/PLATELET
Basophils Absolute: 0 10*3/uL (ref 0.0–0.1)
Basophils Relative: 1 %
EOS ABS: 0.1 10*3/uL (ref 0.0–0.7)
EOS PCT: 1 %
HCT: 41.7 % (ref 36.0–46.0)
Hemoglobin: 13 g/dL (ref 12.0–15.0)
LYMPHS ABS: 3.1 10*3/uL (ref 0.7–4.0)
LYMPHS PCT: 48 %
MCH: 28.2 pg (ref 26.0–34.0)
MCHC: 31.2 g/dL (ref 30.0–36.0)
MCV: 90.5 fL (ref 78.0–100.0)
MONOS PCT: 6 %
Monocytes Absolute: 0.4 10*3/uL (ref 0.1–1.0)
Neutro Abs: 2.8 10*3/uL (ref 1.7–7.7)
Neutrophils Relative %: 44 %
PLATELETS: 206 10*3/uL (ref 150–400)
RBC: 4.61 MIL/uL (ref 3.87–5.11)
RDW: 16.5 % — ABNORMAL HIGH (ref 11.5–15.5)
WBC: 6.3 10*3/uL (ref 4.0–10.5)

## 2016-01-25 ENCOUNTER — Other Ambulatory Visit (HOSPITAL_COMMUNITY): Payer: Medicare Other

## 2016-01-25 ENCOUNTER — Ambulatory Visit (HOSPITAL_COMMUNITY): Payer: Medicare Other

## 2016-01-25 ENCOUNTER — Ambulatory Visit (HOSPITAL_COMMUNITY): Payer: Medicare Other | Admitting: Oncology

## 2016-01-27 ENCOUNTER — Ambulatory Visit (HOSPITAL_COMMUNITY): Payer: Medicare Other

## 2016-01-27 ENCOUNTER — Ambulatory Visit (HOSPITAL_COMMUNITY): Payer: Medicare Other | Admitting: Oncology

## 2016-01-29 ENCOUNTER — Encounter (HOSPITAL_BASED_OUTPATIENT_CLINIC_OR_DEPARTMENT_OTHER): Payer: Medicare Other | Admitting: Oncology

## 2016-01-29 ENCOUNTER — Encounter (HOSPITAL_COMMUNITY): Payer: Self-pay | Admitting: Oncology

## 2016-01-29 ENCOUNTER — Encounter (HOSPITAL_COMMUNITY): Payer: Self-pay | Admitting: Lab

## 2016-01-29 ENCOUNTER — Encounter: Payer: Self-pay | Admitting: Dietician

## 2016-01-29 ENCOUNTER — Encounter (HOSPITAL_COMMUNITY): Payer: Medicare Other

## 2016-01-29 DIAGNOSIS — C7951 Secondary malignant neoplasm of bone: Secondary | ICD-10-CM

## 2016-01-29 DIAGNOSIS — R000 Tachycardia, unspecified: Secondary | ICD-10-CM | POA: Diagnosis not present

## 2016-01-29 DIAGNOSIS — C50919 Malignant neoplasm of unspecified site of unspecified female breast: Secondary | ICD-10-CM | POA: Diagnosis not present

## 2016-01-29 MED ORDER — DENOSUMAB 120 MG/1.7ML ~~LOC~~ SOLN
120.0000 mg | Freq: Once | SUBCUTANEOUS | Status: DC
  Filled 2016-01-29: qty 1.7

## 2016-01-29 MED ORDER — OXYCODONE HCL 10 MG PO TABS: ORAL_TABLET | ORAL | 0 refills | Status: DC

## 2016-01-29 NOTE — Progress Notes (Unsigned)
Referral sent to [facility].  Records faxed on 1/12

## 2016-01-29 NOTE — Patient Instructions (Addendum)
[place] at [facility] Discharge Instructions  RECOMMENDATIONS MADE BY THE CONSULTANT AND ANY TEST RESULTS WILL BE SENT TO YOUR REFERRING PHYSICIAN.  You saw Kirby Crigler, PA-C, today. Hospice referral. Call for return appt. If better. See Amy at checkout for appointments.  Thank you for choosing [place] at [facility] to provide your oncology and hematology care.  To afford each patient quality time with our provider, please arrive at least 15 minutes before your scheduled appointment time.    If you have a lab appointment with the [facility] please come in thru the  Main Entrance and check in at the main information desk  You need to re-schedule your appointment should you arrive 10 or more minutes late.  We strive to give you quality time with our providers, and arriving late affects you and other patients whose appointments are after yours.  Also, if you no show three or more times for appointments you may be dismissed from the clinic at the providers discretion.     Again, thank you for choosing [facility].  Our hope is that these requests will decrease the amount of time that you wait before being seen by our physicians.       _____________________________________________________________  Should you have questions after your visit to [facility], please contact our office at [phone] between the hours of 8:30 a.m. and 4:30 p.m.  Voicemails left after 4:30 p.m. will not be returned until the following business day.  For prescription refill requests, have your pharmacy contact our office.       Resources For Cancer Patients and their Caregivers ? American Cancer Society: Can assist with transportation, wigs, general needs, runs Look Good Feel Better.        617-806-5615 ? Cancer Care: Provides financial assistance, online support groups, medication/co-pay assistance.  1-800-813-HOPE  7700759039) ? [place] Assists [place] cancer patients and their families through emotional , educational and financial support.  559-526-9343 ? [place] DSS Where to apply for food stamps, Medicaid and utility assistance. 304-607-9286 ? [employer]: Transportation to medical appointments. 225-866-5114 ? Social Security Administration: May apply for disability if have a Stage IV cancer. 616-423-8402 928-825-3937 ? [employer], Disability and Transit Services: Assists with nutrition, care and transit needs. [place] Support Programs: @10RELATIVEDAYS @ > Cancer Support Group  2nd Tuesday of the month 1pm-2pm, Journey Room  > Creative Journey  3rd Tuesday of the month 1130am-1pm, Journey Room  > Look Good Feel Better  1st Wednesday of the month 10am-12 noon, Journey Room (Call [place] to register [phone])

## 2016-01-29 NOTE — Progress Notes (Signed)
RD following up with Stage 4 Breast Cancer pt. Patient with continued wt loss/poor appetite.   Contacted Pt by Visiting during office visit. She has been off treatment x 5 weeks  Wt Readings from Last 10 Encounters:  01/29/16 227 lb 4.8 oz (103.1 kg)  01/20/16 221 lb (100.2 kg)  01/13/16 231 lb (104.8 kg)  12/24/15 228 lb (103.4 kg)  12/21/15 229 lb 3.2 oz (104 kg)  12/01/15 240 lb (108.9 kg)  11/24/15 242 lb (109.8 kg)  11/17/15 239 lb 12.8 oz (108.8 kg)  11/14/15 250 lb (113.4 kg)  11/10/15 250 lb (113.4 kg)   Surprisingly, though pt says she had the flu for the past few weeks, she appears to have gained 7 lbs in last 2 weeks.   Patient reports oral intake as poor-fair and is still suffering from symptoms including nausea, taste changes, fatigue, neuropathy and constipation.   Fortunately, patient has made a couple positive changes to her dietary habits.   She had restarted her marinol since she was seen ~1 month ago. She says she has been more hungry recently. She cites an instance where a family member brought her a [place] which she ate all of and then was still hungry. She has been eating egg/bacon biscuit in mornings. She does still eat large amount of fruit. Her meals sounds to have a protein source  Gave praise/encouragement for diet changes made. She still is drinking mostly water (has occasional soda) and says this is due to the fact that all other beverages taste bad. She says her dysgeusia is now only with liquids and not solids.   he cites nausea as one of her biggest obstacles to PO intake. It begins when she gets up and it sometimes continues throughout day. She also gets nauseated after she takes all her medications. She has been off tx for 5 weeks and she believes one of her meds is causing the nausea. She brought her medications today and PA will review with her.   She has not found a nutritional supplement she likes. She loves fruit. We discussed making  smoothies with the addition of either ensure or protein powder. She seemed very willing/motivated to try this. She is told where she can fine protein powder.   Pt acknowledged she is "just tired" and worn down from being sick all the time. She cant use hands due to neuropathy and has fallen. Very likely this has had some impact on her ability to prepare meals/eat as well. PA having [place] discussion  RD to continue to follow  Burtis Junes RD, LDN, [place] Nutrition Pager: [phone] 01/29/2016 12:07 PM

## 2016-01-29 NOTE — Assessment & Plan Note (Addendum)
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  Now on Xeloda 7 days on and 7 days off, but this was put on HOLD on 12/24/2015 due to concerns regarding tolerability and performance status.  Oncology history is updated.  No role for labs today.  Xgeva discontinued.  On her last encounter, she was advised to bring in all of her medications with her for today's encounter due to patient confusion regarding medications and concerns for her taking the incorrect medications and/or too much of some medications.  She brought some of her medications with her today and we reviewed them.  No medication changes provided at this time.  Pain contract in place with [facility].    DO NOT RESUSCITATE.  DNR form filled out and provided to the patient on 10/24/2014.  She saw Dr. Karie Kirks, Primary Care Provider recently who cut back on one of her BP medications.    She has been off Xeloda x 5 weeks without a significant improvement in her QOL and performance status.  Her performance status is of major concern.  She has gained some weight and Marinol is helping, but her performance status is not improved.      I have not heard from Dr. Karie Kirks, nor do I have a copy of his recent office note with the patient.  We had a long conversation regarding Hospice vs Chemotherapy.  The patient is aware that chemotherapy may adversely affect quality of life and may not prove to be beneficial.  Patient education was given regarding Hospice and the services they provide.  The patient understands that studies report that early enrollment in Hospice actually allows the patient to live longer compared to those who enroll in Hospice nearer to end of life.  Hospice will allow the patient to stay at home at end of life or go to a facility for end of life care.  At this point, the patient would like to remain at home.  Hospice provides the patient with a team of providers to help with care including  physicians, nurses, aids, chaplains, and social workers.  The patient is certainly Hospice appropriate with a life expectancy of less than 6 months.  The patient understands that he can be discharged from Hospice at anytime.    Referral to [facility] is made.  She will call for an appointment with [place], if she improves.

## 2016-01-29 NOTE — Progress Notes (Signed)
[name], MD [place] [address]  Bone metastasis [facility]) - Plan: DISCONTINUED: Oxycodone HCl 10 MG TABS  Invasive ductal carcinoma of breast, unspecified laterality ([place]) - Plan: DISCONTINUED: Oxycodone HCl 10 MG TABS  Infiltrating ductal carcinoma of breast, unspecified laterality ([place]) - Plan: DISCONTINUED: Oxycodone HCl 10 MG TABS  Tachycardia - Plan: DISCONTINUED: Oxycodone HCl 10 MG TABS  CURRENT THERAPY: Xeloda on HOLD  INTERVAL HISTORY: KERSTIE AGENT 61 y.o. female returns for followup of Stage IV breast cancer, ER/PR+, HER2 neg, with new hepatic metastasis (biopsy proven).      Invasive ductal carcinoma of breast ([place])   05/26/2009 Initial Diagnosis    Needle core biopsy showed invasive mammary carcinoma with 1 + lymph node      06/03/2009 Surgery    Right modified radical mastectomy with 8/8 + lymph nodes by Dr. Ronnell Freshwater      07/22/2009 Cancer Staging    Bone scan + for multiple osseous lesions.  No disease elsewhere.      07/24/2009 - 03/23/2010 Chemotherapy    Arimidex      03/23/2010 Progression         03/23/2010 - 12/05/2012 Chemotherapy    Tamoxifen      11/28/2012 Progression    Worsening of lumbar spine osseous disease      12/06/2012 - 09/25/2014 Chemotherapy    Fulvestrant + Exemestane      01/24/2014 PET scan    No evidence of hypermetabolic recurrent or metastatic disease. Widespread osseous metastasis, without significant marrow hypermetabolism.      09/18/2014 Progression    PET- New hepatic metastasis within the LEFT hepatic lobe involving segments IVA, IVB, and III.      10/20/2014 - 11/13/2014 Chemotherapy    Afinitor + Exemestane      11/13/2014 Adverse Reaction    Increasing fatigue      11/13/2014 Treatment Plan Change    Hold Afinitor, will reduce dose to 7.5 mg daily.  Continue Exemestane and Xgeva.      11/24/2014 - 01/16/2015 Chemotherapy    Afinitor 7.5 mg daily, Exemestane daily, and  continuation of Xgeva.      01/16/2015 Adverse Reaction    Cough, ?Afinitor-induced      01/16/2015 - 02/02/2015 Chemotherapy    HOLD AFFINITOR secondary to cough, weight loss, Continue EXEMESTANE and Xgeva      01/30/2015 PET scan    Significant partial metabolic treatment response in liver metastases, nonenlarged R level 2 cervical LN, non specific, stable sclerotic osseous metastases throughout skeleton      02/02/2015 Adverse Reaction    Weight loss, decreased appetite      02/02/2015 Treatment Plan Change    Afinitor dose further reduced to 5 mg.      02/02/2015 -  Chemotherapy    Afinitor 5 mg and Exemestane with continuation of Xgeva      06/03/2015 Imaging    CT CAP- Hepatic metastatic disease appears to have improved slightly in the interval from 01/30/2015. Diffuse osseous metastatic disease.      08/21/2015 Imaging    CT CAP- Stable CT of the chest. No evidence for metastatic disease in the thorax. Overall, no substantial change in pre-existing liver lesions. There may be a new 10 mm lesion in the posterior right dome of liver, not seen previously.       09/30/2015 Imaging    MRI liver- 1. Innumerable (> than 30) confluent liver  metastases throughout the liver, significantly better visualized at MRI compared to prior CT and PET-CT studies, which limits comparison. By best estimate, there has been progression of the liver metastases compared to the 06/03/2015 CT study. 2. Patchy lesions throughout the thoracolumbar spine, correlating with the known extensive sclerotic osseous metastatic disease as seen on multiple prior CT studies, not appreciably changed.      09/30/2015 Progression    MRI of liver demonstrates significant progression of disease and climbing CA 27.29 and CA 15-3.      10/09/2015 Procedure    Port placement by Dr. Arnoldo Morale      10/13/2015 - 11/17/2015 Chemotherapy    The patient had PACLitaxel (TAXOL) 138 mg in dextrose 5 % 250 mL chemo infusion  (Dose modification: 60 mg/m2 (original dose 80 mg/m2, Cycle 1, Reason: Change in LFTs), 60 mg/m2 (original dose 80 mg/m2, Cycle 1, Reason: Change in LFTs)  for chemotherapy treatment.        11/24/2015 Treatment Plan Change    Treatment deferred x 1 week.      12/01/2015 Adverse Reaction    Paclitaxel-induced peripheral neuropathy      12/09/2015 Treatment Plan Change    Change to Xeloda 1000 mg/2 BID 7 days on and 7 days off.      12/21/2015 Imaging    MRI brain- 1. Motion degraded study without acute finding or evidence of intracranial metastatic disease. 2. Known osseous metastases. 3. 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      12/21/2015 Treatment Plan Change    Xeloda put on hold from 12/21/15- 01/25/2016       She saw Dr. Karie Kirks in follow-up.  She notes a decrease in BP medications.  She notes a small improvement in her appetite with Marinol.  Her weight is up ~ 6 lbs.   She notes ongoing dizziness with 3 falls since being seen last.  She denies any injuries.  She admits to no improvement in performance status.  "I am not giving up, but I am tired of being sick."  Peripheral neuropathy remains a big issue for her.  She cannot open or close the lids on her medications.  She brought some of her medication with her.  Some did not have a lid on it because she couldn't replace the lid once removed.    We discussed options moving forward including re-challenge with chemotherapy versus palliative/Hospice intervention.  After long discussion, she opted for the later.  Review of Systems  Constitutional: Negative.  Negative for chills, fever and weight loss.  HENT: Negative.   Eyes: Negative.   Respiratory: Positive for cough. Negative for sputum production.   Cardiovascular: Negative.  Negative for chest pain.  Gastrointestinal: Negative.   Genitourinary: Negative.  Negative for dysuria.  Musculoskeletal: Positive for falls.  Skin: Negative.     Neurological: Negative.  Negative for weakness.  Endo/Heme/Allergies: Negative.   Psychiatric/Behavioral: Negative.     Past Medical History:  Diagnosis Date  . Arthritis   . Bone metastasis ([place]) 09/13/2010  . Cancer ([place])    rt breast is primary  . Depression   . Depression 12/14/2011  . Diabetes mellitus    type II  . DNR (do not resuscitate) 11/05/2014  . GERD (gastroesophageal reflux disease)   . High cholesterol   . Hypertension   . Invasive ductal carcinoma of breast ([place]) 09/13/2010  . Knee pain, bilateral 2013  . Opioid contract exists with [place] 10/24/2014  Contract found in Chart review in letter tab.    Past Surgical History:  Procedure Laterality Date  . ABDOMINAL HYSTERECTOMY    . KNEE SURGERY Bilateral   . MASTECTOMY     right side  . PORTACATH PLACEMENT  10/09/2015   Procedure: INSERTION PORT-A-CATH LEFT SUBCLAVIAN;  Surgeon: Aviva Signs, MD;  Location: [facility];  Service: General;;    Family History  Problem Relation Age of Onset  . Diabetes Mother   . Diabetes Father   . Arthritis    . Cancer    . Diabetes    . Cancer Maternal Aunt   . Anesthesia problems Neg Hx   . Hypotension Neg Hx   . Malignant hyperthermia Neg Hx   . Pseudochol deficiency Neg Hx     Social History   Social History  . Marital status: Single    Spouse name: N/A  . Number of children: N/A  . Years of education: 26   Occupational History  . disabled Unemployed   Social History Main Topics  . Smoking status: Never Smoker  . Smokeless tobacco: Never Used  . Alcohol use No  . Drug use: No  . Sexual activity: Yes    Birth control/ protection: Surgical   Other Topics Concern  . None   Social History Narrative  . None     PHYSICAL EXAMINATION  ECOG PERFORMANCE STATUS: 2 - Symptomatic, <50% confined to bed  Vitals:   01/29/16 1029  BP: (!) 143/82  Pulse: (!) 116  Resp: 16  Temp: 98.6 F (37 C)    GENERAL:alert, no distress,  comfortable, cooperative, obese and unaccompanied, in wheelchair. SKIN: skin color, texture, turgor are normal, no rashes or significant lesions HEAD: Normocephalic, No masses, lesions, tenderness or abnormalities EYES: normal, Conjunctiva are pink and non-injected EARS: External ears normal OROPHARYNX:mucous membranes are moist  NECK: supple LYMPH:  not examined BREAST:not examined LUNGS: clear to auscultation  HEART: regular rate & rhythm ABDOMEN:obese and normal bowel sounds BACK: Back symmetric, no curvature. EXTREMITIES:less then 2 second capillary refill, no joint deformities, effusion, or inflammation, no edema, no skin discoloration, no cyanosis  NEURO: alert & oriented x 3 with fluent speech   LABORATORY DATA: CBC    Component Value Date/Time   WBC 6.3 01/22/2016 1301   RBC 4.61 01/22/2016 1301   HGB 13.0 01/22/2016 1301   HCT 41.7 01/22/2016 1301   PLT 206 01/22/2016 1301   MCV 90.5 01/22/2016 1301   MCH 28.2 01/22/2016 1301   MCHC 31.2 01/22/2016 1301   RDW 16.5 (H) 01/22/2016 1301   LYMPHSABS 3.1 01/22/2016 1301   MONOABS 0.4 01/22/2016 1301   EOSABS 0.1 01/22/2016 1301   BASOSABS 0.0 01/22/2016 1301      Chemistry      Component Value Date/Time   NA 138 01/22/2016 1301   K 4.3 01/22/2016 1301   CL 106 01/22/2016 1301   CO2 26 01/22/2016 1301   BUN 16 01/22/2016 1301   CREATININE 1.17 (H) 01/22/2016 1301      Component Value Date/Time   CALCIUM 9.3 01/22/2016 1301   ALKPHOS 257 (H) 01/20/2016 0924   AST 98 (H) 01/20/2016 0924   ALT 64 (H) 01/20/2016 0924   BILITOT 0.8 01/20/2016 0924        PENDING LABS:   RADIOGRAPHIC STUDIES:  Dg Chest 2 View  Result Date: 01/22/2016 CLINICAL DATA:  Productive cough, congestion, shortness of breath and chest pain for 2 weeks. EXAM: CHEST  2 VIEW  COMPARISON:  Single-view of the chest 10/09/2015 and 01/22/2015. CT chest, abdomen and pelvis 08/21/2015. FINDINGS: The lungs are clear. Heart size is normal. No  pneumothorax or pleural effusion. Postoperative change of right mastectomy and axillary dissection is identified. Port-A-Cath is in place. Scattered sclerotic bony lesions consistent with metastatic breast carcinoma are identified as seen on prior studies. IMPRESSION: No acute disease.  Stable compared to prior exams. Electronically Signed   By: Inge Rise M.D.   On: 01/22/2016 12:50     PATHOLOGY:    ASSESSMENT AND PLAN:  Invasive ductal carcinoma of breast ([place]) Stage IV Breast cancer with bone involvement requiring Xgeva.  Now on Xeloda 7 days on and 7 days off, but this was put on HOLD on 12/24/2015 due to concerns regarding tolerability and performance status.  Oncology history is updated.  No role for labs today.  Xgeva discontinued.  On her last encounter, she was advised to bring in all of her medications with her for today's encounter due to patient confusion regarding medications and concerns for her taking the incorrect medications and/or too much of some medications.  She brought some of her medications with her today and we reviewed them.  No medication changes provided at this time.  Pain contract in place with [facility].    DO NOT RESUSCITATE.  DNR form filled out and provided to the patient on 10/24/2014.  She saw Dr. Karie Kirks, Primary Care Provider recently who cut back on one of her BP medications.    She has been off Xeloda x 5 weeks without a significant improvement in her QOL and performance status.  Her performance status is of major concern.  She has gained some weight and Marinol is helping, but her performance status is not improved.      I have not heard from Dr. Karie Kirks, nor do I have a copy of his recent office note with the patient.  We had a long conversation regarding Hospice vs Chemotherapy.  The patient is aware that chemotherapy may adversely affect quality of life and may not prove to be beneficial.  Patient education was given  regarding Hospice and the services they provide.  The patient understands that studies report that early enrollment in Hospice actually allows the patient to live longer compared to those who enroll in Hospice nearer to end of life.  Hospice will allow the patient to stay at home at end of life or go to a facility for end of life care.  At this point, the patient would like to remain at home.  Hospice provides the patient with a team of providers to help with care including physicians, nurses, aids, chaplains, and social workers.  The patient is certainly Hospice appropriate with a life expectancy of less than 6 months.  The patient understands that he can be discharged from Hospice at anytime.    Referral to [facility] is made.  She will call for an appointment with [place], if she improves.   ORDERS PLACED FOR THIS ENCOUNTER: No orders of the defined types were placed in this encounter.   MEDICATIONS PRESCRIBED THIS ENCOUNTER: Meds ordered this encounter  Medications  . DISCONTD: Oxycodone HCl 10 MG TABS    Sig: Take 1 tablet every 4 hours as needed for pain.    Dispense:  100 tablet    Refill:  0    To be filled on 02/10/2016    Order Specific Question:   Supervising Provider  AnswerPatrici Ranks [[id number]]    THERAPY PLAN:  HOLD Xeloda, will re-address at follow-up appointment.  All questions were answered. The patient knows to call the clinic with any problems, questions or concerns. We can certainly see the patient much sooner if necessary.  Patient and plan discussed with Dr. Ancil Linsey and she is in agreement with the aforementioned.   This note is electronically signed by: Doy Mince 01/29/2016 11:45 AM

## 2016-01-29 NOTE — Progress Notes (Signed)
[place] See held today per Kirby Crigler PA-C, pt being referred to Hospice.

## 2016-02-04 ENCOUNTER — Ambulatory Visit (HOSPITAL_COMMUNITY): Payer: Medicare Other | Admitting: Oncology

## 2016-02-11 NOTE — Congregational Nurse Program (Signed)
Congregational Nurse Program Note  Date of Encounter: 02/10/2016  Past Medical History: Past Medical History:  Diagnosis Date  . Arthritis   . Bone metastasis ([place]) 09/13/2010  . Cancer ([place])    rt breast is primary  . Depression   . Depression 12/14/2011  . Diabetes mellitus    type II  . DNR (do not resuscitate) 11/05/2014  . GERD (gastroesophageal reflux disease)   . High cholesterol   . Hypertension   . Invasive ductal carcinoma of breast ([place]) 09/13/2010  . Knee pain, bilateral 2013  . Opioid contract exists with [facility] 10/24/2014   Contract found in Chart review in letter tab.   Has Breast Cancer and bone cancer . Was told no more Treatments will be done.Depressed but taking it one day at a time.Blood pressure 111/67 Pulse 104.  Some problems walking. Hospice sees her and helps here. Marko Plume [phone].  Encounter Details:     CNP Questionnaire - 02/10/16 1234      Patient Demographics   Is this a new or existing patient? New   Patient is considered a/an Not Applicable   Race African-American/Black     Patient Assistance   Location of Patient Assistance [place]   Patient's financial/insurance status Medicaid   Uninsured Patient ([place] Card/Care Connects) No   Patient referred to apply for the following financial assistance Not Applicable   Food insecurities addressed Provided food supplies   Transportation assistance No   Assistance securing medications No   Educational health offerings Cancer;Nutrition     Encounter Details   Primary purpose of visit Education/Health Concerns   Was an Emergency Department visit averted? Not Applicable   Does patient have a medical provider? Yes   Patient referred to Not Applicable   Was a mental health screening completed? (GAINS tool) No   Does patient have dental issues? No   Does patient have vision issues? No   Does your patient have an abnormal blood pressure today? No   Since  previous encounter, have you referred patient for abnormal blood pressure that resulted in a new diagnosis or medication change? No   Does your patient have an abnormal blood glucose today? No   Since previous encounter, have you referred patient for abnormal blood glucose that resulted in a new diagnosis or medication change? No   Was there a life-saving intervention made? No

## 2016-02-15 ENCOUNTER — Encounter (HOSPITAL_COMMUNITY): Payer: Self-pay | Admitting: Hematology & Oncology

## 2016-02-25 ENCOUNTER — Other Ambulatory Visit (HOSPITAL_COMMUNITY): Payer: Self-pay | Admitting: Oncology

## 2016-02-25 DIAGNOSIS — E876 Hypokalemia: Secondary | ICD-10-CM

## 2016-03-01 ENCOUNTER — Emergency Department (HOSPITAL_COMMUNITY)

## 2016-03-01 ENCOUNTER — Encounter (HOSPITAL_COMMUNITY): Payer: Self-pay | Admitting: *Deleted

## 2016-03-01 ENCOUNTER — Emergency Department (HOSPITAL_COMMUNITY)
Admission: EM | Admit: 2016-03-01 | Discharge: 2016-03-01 | Disposition: A | Discharge status: Home / Self Care | Attending: Emergency Medicine | Admitting: Emergency Medicine

## 2016-03-01 DIAGNOSIS — Z853 Personal history of malignant neoplasm of breast: Secondary | ICD-10-CM | POA: Diagnosis not present

## 2016-03-01 DIAGNOSIS — Z85830 Personal history of malignant neoplasm of bone: Secondary | ICD-10-CM | POA: Diagnosis not present

## 2016-03-01 DIAGNOSIS — Z7982 Long term (current) use of aspirin: Secondary | ICD-10-CM | POA: Insufficient documentation

## 2016-03-01 DIAGNOSIS — E1122 Type 2 diabetes mellitus with diabetic chronic kidney disease: Secondary | ICD-10-CM | POA: Insufficient documentation

## 2016-03-01 DIAGNOSIS — R112 Nausea with vomiting, unspecified: Secondary | ICD-10-CM

## 2016-03-01 DIAGNOSIS — C787 Secondary malignant neoplasm of liver and intrahepatic bile duct: Secondary | ICD-10-CM | POA: Diagnosis not present

## 2016-03-01 DIAGNOSIS — Z79899 Other long term (current) drug therapy: Secondary | ICD-10-CM | POA: Insufficient documentation

## 2016-03-01 DIAGNOSIS — Z794 Long term (current) use of insulin: Secondary | ICD-10-CM | POA: Diagnosis not present

## 2016-03-01 DIAGNOSIS — I129 Hypertensive chronic kidney disease with stage 1 through stage 4 chronic kidney disease, or unspecified chronic kidney disease: Secondary | ICD-10-CM | POA: Diagnosis not present

## 2016-03-01 DIAGNOSIS — N189 Chronic kidney disease, unspecified: Secondary | ICD-10-CM | POA: Diagnosis not present

## 2016-03-01 LAB — COMPREHENSIVE METABOLIC PANEL
ALBUMIN: 2.2 g/dL — AB (ref 3.5–5.0)
ALK PHOS: 1800 U/L — AB (ref 38–126)
ALT: 139 U/L — AB (ref 14–54)
AST: 457 U/L — ABNORMAL HIGH (ref 15–41)
Anion gap: 8 (ref 5–15)
BUN: 28 mg/dL — ABNORMAL HIGH (ref 6–20)
CALCIUM: 8.9 mg/dL (ref 8.9–10.3)
CHLORIDE: 94 mmol/L — AB (ref 101–111)
CO2: 25 mmol/L (ref 22–32)
CREATININE: 1.17 mg/dL — AB (ref 0.44–1.00)
GFR calc Af Amer: 57 mL/min — ABNORMAL LOW (ref >60)
GFR calc non Af Amer: 49 mL/min — ABNORMAL LOW (ref >60)
GLUCOSE: 474 mg/dL — AB (ref 65–99)
Potassium: 4.5 mmol/L (ref 3.5–5.1)
SODIUM: 127 mmol/L — AB (ref 135–145)
Total Bilirubin: 21 mg/dL (ref 0.3–1.2)
Total Protein: 7.2 g/dL (ref 6.5–8.1)

## 2016-03-01 LAB — CBC WITH DIFFERENTIAL/PLATELET
BASOS ABS: 0 10*3/uL (ref 0.0–0.1)
Basophils Relative: 1 %
EOS ABS: 0 10*3/uL (ref 0.0–0.7)
EOS PCT: 0 %
HCT: 38.2 % (ref 36.0–46.0)
HEMOGLOBIN: 12.8 g/dL (ref 12.0–15.0)
Lymphocytes Relative: 17 %
Lymphs Abs: 1.3 10*3/uL (ref 0.7–4.0)
MCH: 29.5 pg (ref 26.0–34.0)
MCHC: 33.5 g/dL (ref 30.0–36.0)
MCV: 88 fL (ref 78.0–100.0)
Monocytes Absolute: 0.6 10*3/uL (ref 0.1–1.0)
Monocytes Relative: 8 %
NEUTROS PCT: 74 %
Neutro Abs: 5.6 10*3/uL (ref 1.7–7.7)
PLATELETS: 198 10*3/uL (ref 150–400)
RBC: 4.34 MIL/uL (ref 3.87–5.11)
RDW: 17.8 % — ABNORMAL HIGH (ref 11.5–15.5)
WBC: 7.5 10*3/uL (ref 4.0–10.5)

## 2016-03-01 LAB — LIPASE, BLOOD: Lipase: 30 U/L (ref 11–51)

## 2016-03-01 MED ORDER — SODIUM CHLORIDE 0.9 % IV BOLUS (SEPSIS)
1000.0000 mL | Freq: Once | INTRAVENOUS | Status: AC
  Administered 2016-03-01: 1000 mL via INTRAVENOUS

## 2016-03-01 MED ORDER — PROMETHAZINE HCL 25 MG/ML IJ SOLN
12.5000 mg | Freq: Once | INTRAMUSCULAR | Status: AC
Start: 2016-03-01 — End: 2016-03-01
  Administered 2016-03-01: 12.5 mg via INTRAVENOUS
  Filled 2016-03-01: qty 1

## 2016-03-01 MED ORDER — ONDANSETRON HCL 4 MG/2ML IJ SOLN
4.0000 mg | Freq: Once | INTRAMUSCULAR | Status: AC
  Administered 2016-03-01: 4 mg via INTRAVENOUS
  Filled 2016-03-01: qty 2

## 2016-03-01 MED ORDER — HEPARIN SOD (PORK) LOCK FLUSH 100 UNIT/ML IV SOLN
INTRAVENOUS | Status: AC
  Filled 2016-03-01: qty 5

## 2016-03-01 MED ORDER — ONDANSETRON 4 MG PO TBDP
4.0000 mg | ORAL_TABLET | Freq: Once | ORAL | Status: AC
  Administered 2016-03-01: 4 mg via ORAL
  Filled 2016-03-01: qty 1

## 2016-03-01 MED ORDER — HYDROMORPHONE HCL 1 MG/ML IJ SOLN
1.0000 mg | Freq: Once | INTRAMUSCULAR | Status: AC
  Administered 2016-03-01: 1 mg via INTRAVENOUS
  Filled 2016-03-01: qty 1

## 2016-03-01 MED ORDER — IOPAMIDOL (ISOVUE-300) INJECTION 61%
100.0000 mL | Freq: Once | INTRAVENOUS | Status: AC | PRN
  Administered 2016-03-01: 100 mL via INTRAVENOUS

## 2016-03-01 MED ORDER — PROMETHAZINE HCL 25 MG PO TABS: 25.0000 mg | ORAL_TABLET | Freq: Four times a day (QID) | ORAL | 0 refills | Status: AC | PRN

## 2016-03-01 MED ORDER — PIPERACILLIN-TAZOBACTAM 3.375 G IVPB 30 MIN
3.3750 g | Freq: Once | INTRAVENOUS | Status: AC
  Administered 2016-03-01: 3.375 g via INTRAVENOUS
  Filled 2016-03-01: qty 50

## 2016-03-01 NOTE — ED Notes (Addendum)
CRITICAL VALUE ALERT  Critical value received:  Total bili  21 Date of notification:  03/01/16  Time of notification:  0620  Critical value read back: yes  Nurse who received alert:  Kendell Bane rn  MD notified (1st page):  horton  Time of first page:  0620  MD notified (2nd page):  Time of second page:  Responding MD:  Dina Rich Time MD responded:  [phone]

## 2016-03-01 NOTE — ED Provider Notes (Signed)
[facility] DEPT Provider Note   CSN: [id number] Arrival date & time: 03/01/16  0425     History   Chief Complaint Chief Complaint  Patient presents with  . Emesis    HPI Kaylee Mitchell is a 61 y.o. female.  HPI  This is a 61 year old female with a history of metastatic breast cancer currently on hospice who presents with vomiting and nausea. Patient reports generalized weakness and nausea and vomiting for last 2 days. Tonight she noted some blood in her vomit. She has not been able to keep anything down. She denies abdominal pain. She denies any fevers or infectious symptoms. She denies chest pain or shortness of breath. She is DO NOT RESUSCITATE. She is amenable to any procedure that may improve her quality of life.  Of note, she was notably icteric upon my evaluation. Family at the bedside states that hospice "noted that her eyes look funny last week."  Past Medical History:  Diagnosis Date  . Arthritis   . Bone metastasis ([place]) 09/13/2010  . Cancer ([place])    rt breast is primary  . Depression   . Depression 12/14/2011  . Diabetes mellitus    type II  . DNR (do not resuscitate) 11/05/2014  . GERD (gastroesophageal reflux disease)   . High cholesterol   . Hypertension   . 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 Knee pain, bilateral 2013  . Opioid contract exists with [facility] 10/24/2014   Contract found in Chart review in letter tab.    Patient Active Problem List   Diagnosis Date Noted  . Decreased oral intake 01/19/2016  . Goals of care, counseling/discussion 11/24/2015  . Liver metastases ([place]) 10/01/2015  . Low blood potassium 07/15/2015  . DNR (do not resuscitate) 11/05/2014  . Opioid contract exists with [facility] 10/24/2014  . Lesion of liver   . URI, acute 11/06/2012  . Lumbar back pain 08/07/2012  . Tinea versicolor 02/11/2012  . 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 CKD  (chronic kidney disease) 01/12/2012  . Hyperlipidemia 01/12/2012  . Essential hypertension, benign 01/12/2012  . Morbid obesity ([place]) 01/12/2012  . Situational depression 12/14/2011  . Difficulty in walking(719.7) 03/25/2011  . S/P arthroscopy of left knee 03/21/2011  . Medial meniscus, posterior horn derangement 03/09/2011  . 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 Bone metastasis ([place]) 09/13/2010    Past Surgical History:  Procedure Laterality Date  . ABDOMINAL HYSTERECTOMY    . KNEE SURGERY Bilateral   . MASTECTOMY     right side  . PORTACATH PLACEMENT  10/09/2015   Procedure: INSERTION PORT-A-CATH LEFT SUBCLAVIAN;  Surgeon: Aviva Signs, MD;  Location: [facility];  Service: General;;    OB History    Gravida Para Term Preterm AB Living   0 0 0 0 0 0   SAB TAB Ectopic Multiple Live Births   0 0 0 0 0       Home Medications    Prior to Admission medications   Medication Sig Start Date End Date Taking? Authorizing Provider  aspirin EC 81 MG tablet Take 81 mg by mouth daily.      Historical Provider, MD  calcium-vitamin D (OSCAL WITH D) 500-200 MG-UNIT per tablet Take 1 tablet by mouth 2 (two) times daily.     Historical Provider, MD  carvedilol (COREG) 12.5 MG tablet TAKE ONE TABLET BY MOUTH TWICE DAILY WITH MEALS 08/19/13  Alycia Rossetti, MD  denosumab (XGEVA) 120 MG/1.7ML SOLN injection Inject 120 mg into the skin every 30 (thirty) days.    Historical Provider, MD  dronabinol (MARINOL) 5 MG capsule Take 2 capsules (10 mg total) by mouth 3 (three) times daily. 12/02/15   Baird Cancer, PA-C  DULoxetine (CYMBALTA) 30 MG capsule  01/04/16   Historical Provider, MD  Insulin Glargine (LANTUS) 100 UNIT/ML Solostar Pen Inject 70 Units into the skin at bedtime.  11/06/12   Alycia Rossetti, MD  KLOR-CON M20 20 MEQ tablet TAKE 1 TABLET (20 MEQ TOTAL) BY MOUTH DAILY. 11/16/15   Baird Cancer, PA-C  losartan-hydrochlorothiazide (HYZAAR) 50-12.5 MG per tablet Take  1 tablet by mouth daily. 01/14/14   Historical Provider, MD  metFORMIN (GLUCOPHAGE) 500 MG tablet Take 500 mg by mouth 2 (two) times daily with a meal.    Historical Provider, MD  omeprazole (PRILOSEC) 40 MG capsule TAKE 1 CAPSULE (40 MG TOTAL) BY MOUTH DAILY. 03/31/15   Baird Cancer, PA-C  temazepam (RESTORIL) 15 MG capsule Take 15 mg by mouth at bedtime as needed.  07/26/15   Historical Provider, MD    Family History Family History  Problem Relation Age of Onset  . Diabetes Mother   . Diabetes Father   . Arthritis    . Cancer    . Diabetes    . Cancer Maternal Aunt   . Anesthesia problems Neg Hx   . Hypotension Neg Hx   . Malignant hyperthermia Neg Hx   . Pseudochol deficiency Neg Hx     Social History Social History  Substance Use Topics  . Smoking status: Never Smoker  . Smokeless tobacco: Never Used  . Alcohol use No     Allergies   Patient has no known allergies.   Review of Systems Review of Systems  Constitutional: Negative for fever.  Respiratory: Negative for shortness of breath.   Cardiovascular: Negative for chest pain.  Gastrointestinal: Positive for nausea and vomiting. Negative for blood in stool and diarrhea.  Genitourinary: Negative for dysuria.  All other systems reviewed and are negative.    Physical Exam Updated Vital Signs BP 121/70 (BP Location: Left Arm)   Pulse 63   Temp 97.8 F (36.6 C) (Oral)   Resp 16   Ht 5\' 5"  (1.651 m)   Wt 227 lb (103 kg)   SpO2 99%   BMI 37.77 kg/m   Physical Exam  Constitutional: She is oriented to person, place, and time.  Chronically ill-appearing, dry heaving  HENT:  Head: Normocephalic and atraumatic.  Alopecia  Eyes:  Scleral icterus noted  Cardiovascular: Normal rate, regular rhythm and normal heart sounds.   Pulmonary/Chest: Effort normal and breath sounds normal. No respiratory distress. She has no wheezes.  Abdominal: Soft. Bowel sounds are normal. There is tenderness. There is no rebound  and no guarding.  Diffuse tenderness to palpation without rebound or guarding  Neurological: She is alert and oriented to person, place, and time.  Skin: Skin is warm and dry.  Psychiatric: She has a normal mood and affect.  Nursing note and vitals reviewed.    ED Treatments / Results  Labs (all labs ordered are listed, but only abnormal results are displayed) Labs Reviewed  CBC WITH DIFFERENTIAL/PLATELET - Abnormal; Notable for the following:       Result Value   RDW 17.8 (*)    All other components within normal limits  COMPREHENSIVE METABOLIC PANEL - Abnormal; Notable for  the following:    Sodium 127 (*)    Chloride 94 (*)    Glucose, Bld 474 (*)    BUN 28 (*)    Creatinine, Ser 1.17 (*)    Albumin 2.2 (*)    AST 457 (*)    ALT 139 (*)    Alkaline Phosphatase 1,800 (*)    Total Bilirubin 21.0 (*)    GFR calc non Af Amer 49 (*)    GFR calc Af Amer 57 (*)    All other components within normal limits  LIPASE, BLOOD    EKG  EKG Interpretation None       Radiology No results found.  Procedures Procedures (including critical care time)  Medications Ordered in ED Medications  ondansetron (ZOFRAN-ODT) disintegrating tablet 4 mg (4 mg Oral Given 03/01/16 0444)  sodium chloride 0.9 % bolus 1,000 mL (0 mLs Intravenous Stopped 03/01/16 0644)  ondansetron (ZOFRAN) injection 4 mg (4 mg Intravenous Given 03/01/16 0526)  promethazine (PHENERGAN) injection 12.5 mg (12.5 mg Intravenous Given 03/01/16 0553)  piperacillin-tazobactam (ZOSYN) IVPB 3.375 g (0 g Intravenous Stopped 03/01/16 0700)  HYDROmorphone (DILAUDID) injection 1 mg (1 mg Intravenous Given 03/01/16 [id number])     Initial Impression / Assessment and Plan / ED Course  I have reviewed the triage vital signs and the nursing notes.  Pertinent labs & imaging results that were available during my care of the patient were reviewed by me and considered in my medical decision making (see chart for details).     She  presents with nausea, vomiting, abdominal pain. She has metastatic breast cancer and is on hospice. She is ill-appearing but nontoxic. Vital signs reassuring. Lab work notable for markedly elevated LFTs and a bilirubin of 21. This is suggestive of likely an obstructive process. Will obtain CT as I do not have ultrasound available at this time. Ascending cholangitis is also consideration. For this reason, patient was given Zosyn. CT is pending.  Final Clinical Impressions(s) / ED Diagnoses   Final diagnoses:  None    New Prescriptions New Prescriptions   No medications on file     Merryl Hacker, MD 03/01/16 [phone]

## 2016-03-01 NOTE — ED Notes (Signed)
This nurse called hospice per MD request. Message left for primary nurse that takes care of patient. MD requests for patient to have increased number of in home visits.

## 2016-03-01 NOTE — ED Triage Notes (Signed)
Pt c/o feeling weak for the last couple of day and vomiting that started today; pt states she has had some blood in her vomit

## 2016-03-17 DEATH — deceased

## 2017-09-30 IMAGING — CT CT ABD-PELV W/ CM
3 series · 14 of 32 positions shown, 18 images · IV contrast (iopamidol)
Comparison: 06/03/2015.

CLINICAL DATA: Stage IV breast cancer metastatic to bone marrow an
liver.

EXAM:
CT CHEST, ABDOMEN, AND PELVIS WITH CONTRAST
TECHNIQUE: Multidetector CT imaging of the chest, abdomen and pelvis was
performed following the standard protocol during bolus
administration of intravenous contrast.
CONTRAST:  100mL OQFJ2N-CAA IOPAMIDOL (OQFJ2N-CAA) INJECTION 61%

[Series 2: chest abd pel with contrast · axial · 0.79mm/px · z∈[-640,-115]mm · 8 of 125 slices shown]
[im 10/125  soft-tissue]
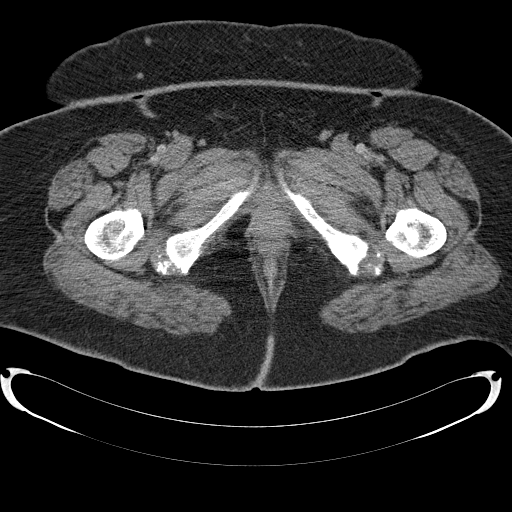
[im 29/125  soft-tissue]
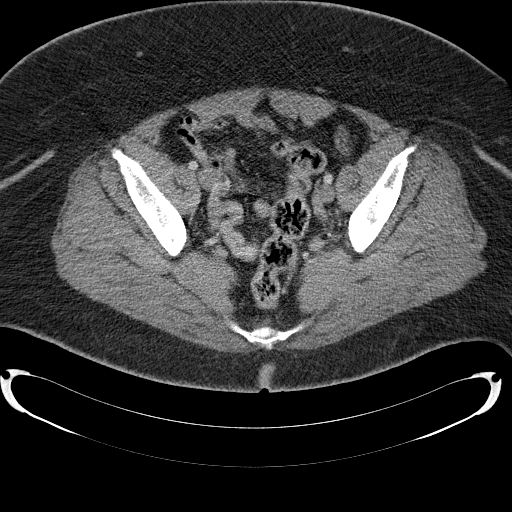
[im 39/125  soft-tissue]
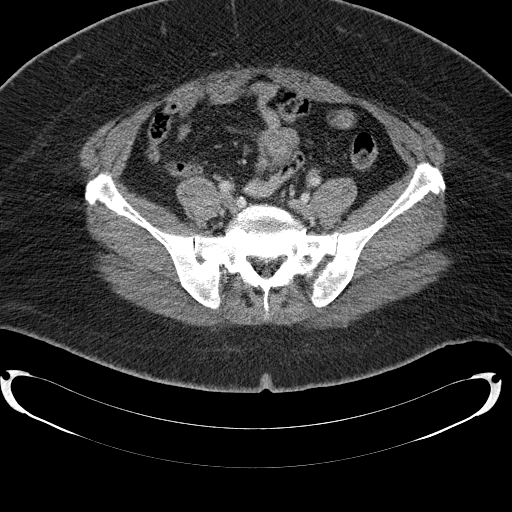
[im 58/125  soft-tissue]
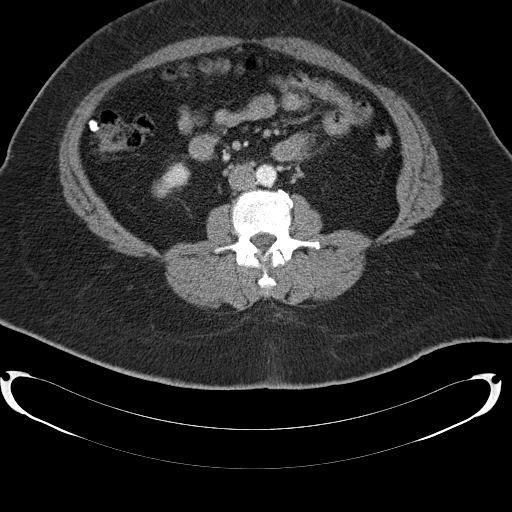
[im 67/125  soft-tissue]
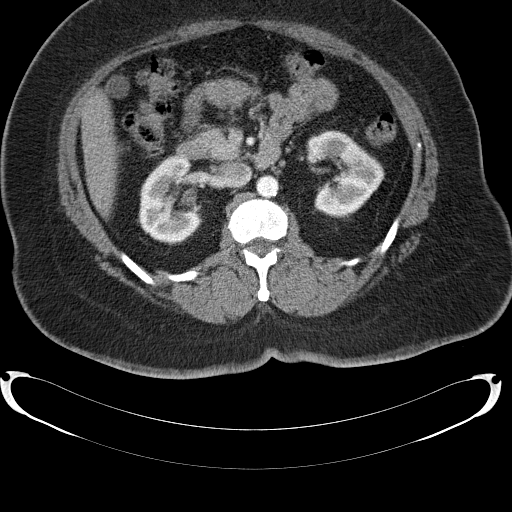
[im 86/125  soft-tissue]
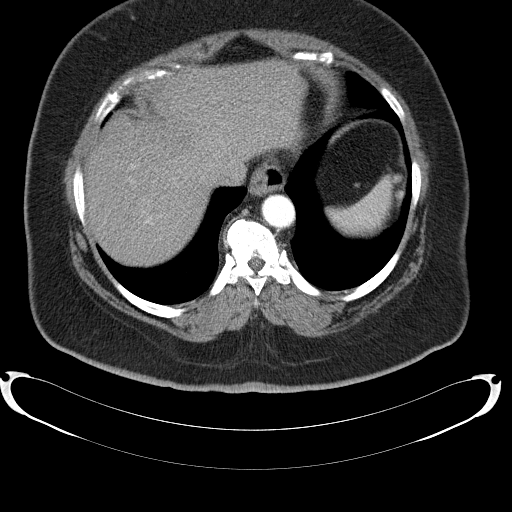
[im 96/125  soft-tissue]
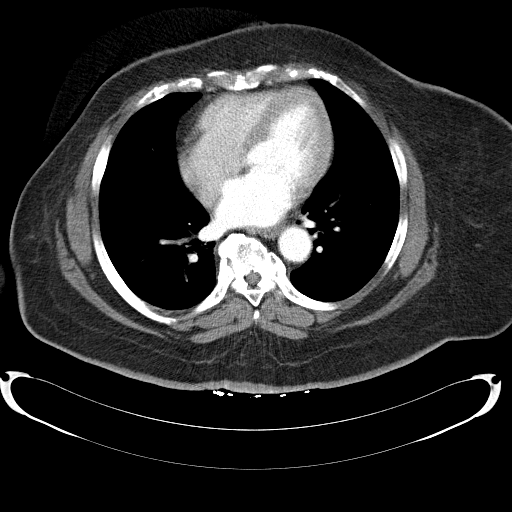
[im 115/125  soft-tissue]
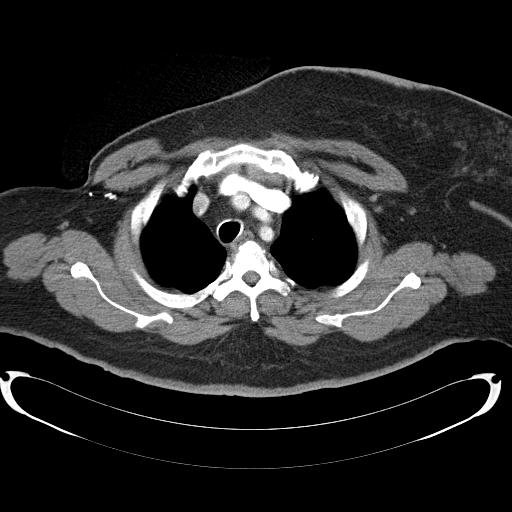

[Series 6: lung · axial · 0.79mm/px · z∈[-287,-227]mm · 3 of 122 slices shown]
[im 11/122  bone]
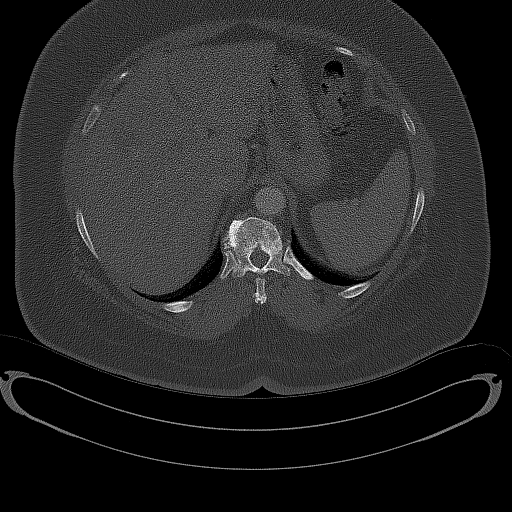
[im 31/122  bone]
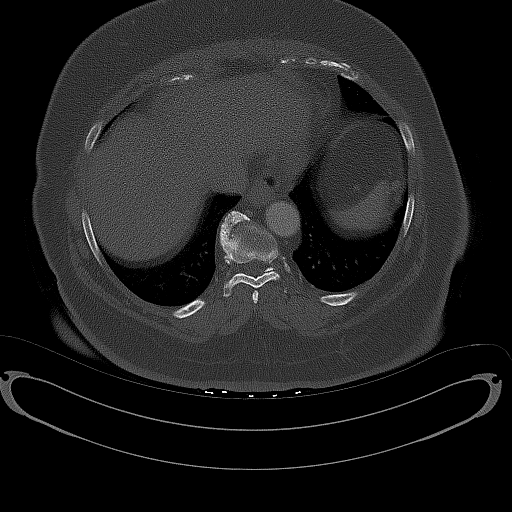
[im 41/122  bone]
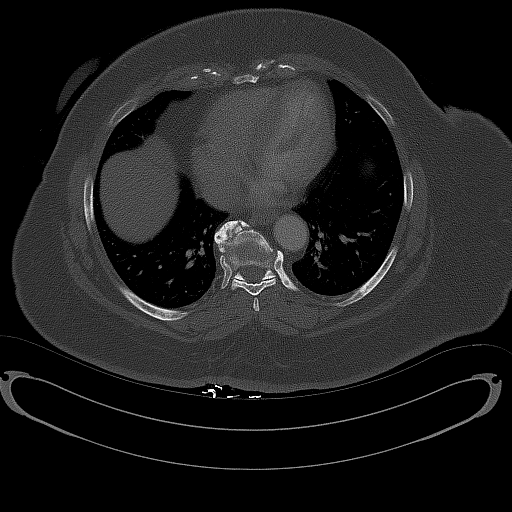

[Series 8: kidney delays · axial · 0.85mm/px · z∈[-406,-296]mm · 3 of 46 slices shown, 7 images]
[im 12/46  soft-tissue]
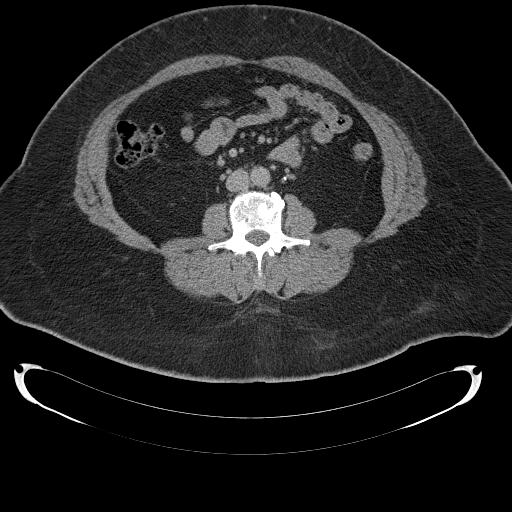
[im 12/46  lung]
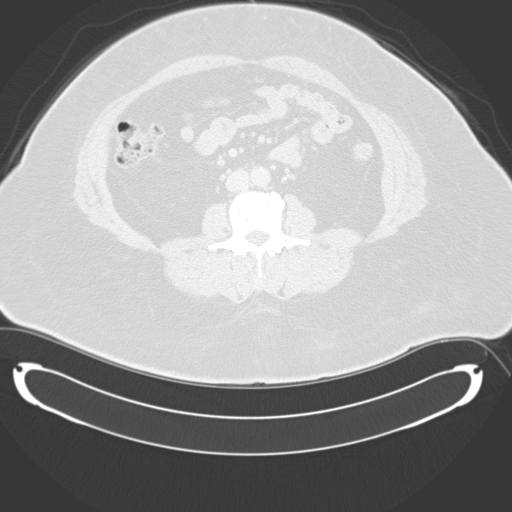
[im 12/46  bone]
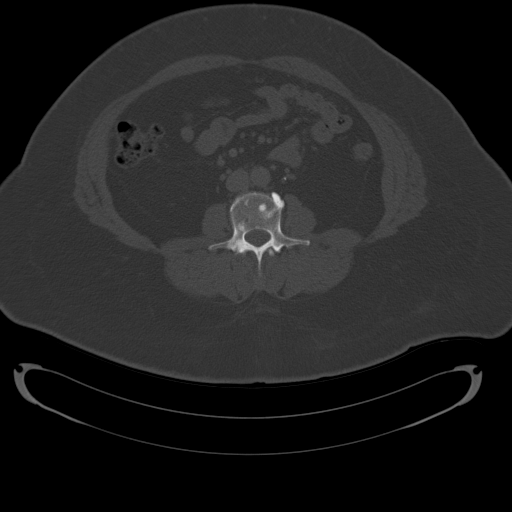
[im 23/46  soft-tissue]
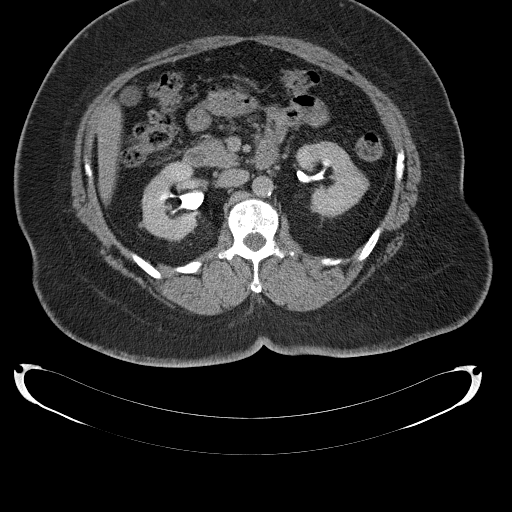
[im 23/46  lung]
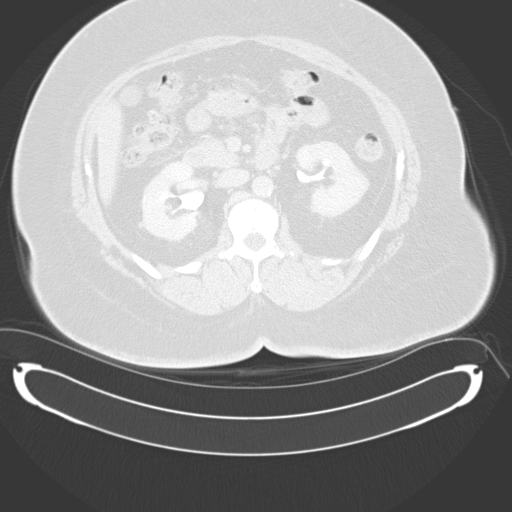
[im 34/46  soft-tissue]
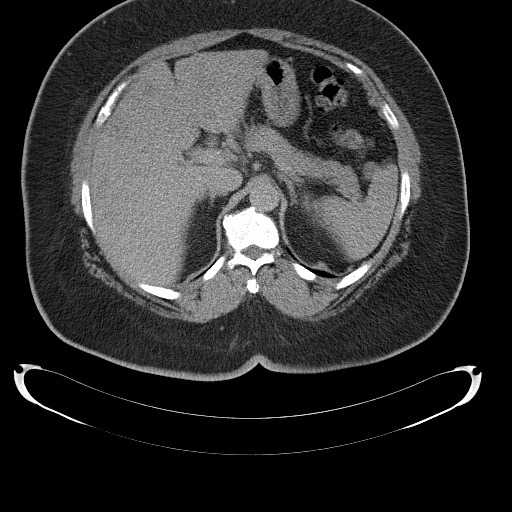
[im 34/46  lung]
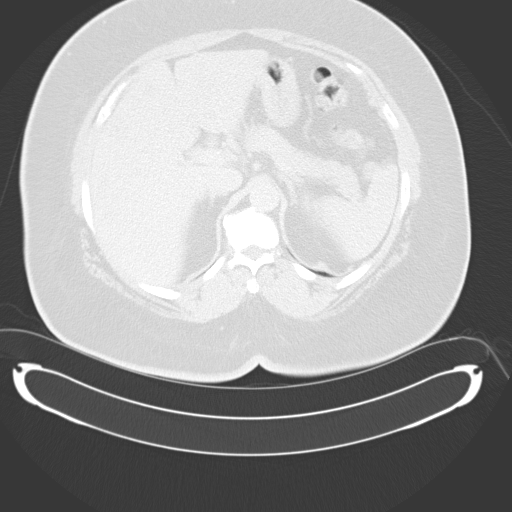

[14 of 32 positions shown; findings below may reference images not displayed]

FINDINGS: CT CHEST FINDINGS

Cardiovascular: The heart size is normal. No pericardial effusion.
Coronary artery calcification is noted. No pericardial effusion.
Atherosclerotic calcification is noted in the wall of the throracic
aorta.

Mediastinum/Nodes: No mediastinal lymphadenopathy. Stable prominence
left inferior thyroid lobe There is no hilar lymphadenopathy. The
esophagus has normal imaging features. Surgical clips noted right
axilla. There is no axillary lymphadenopathy.

Lungs/Pleura: Fine parenchymal detail on the lungs obscured by
breathing motion. No focal airspace consolidation. No pulmonary
edema or pleural effusion. No suspicious pulmonary nodule or mass.

Musculoskeletal: Sclerotic bony metastases in the thoracic spine,
ribs, and sternum appear stable.

CT ABDOMEN PELVIS FINDINGS

Hepatobiliary: 2.1 cm lesion measured in the anterior right liver on
the previous study is now 1.8 cm. 8 mm hypodensity towards the dome
of the liver is stable. 10 mm rim enhancing lesion posterior right
hepatic dome (image 39 series 2) not definitely seen on prior
imaging. There is no evidence for gallstones, gallbladder wall
thickening, or pericholecystic fluid. No intrahepatic or
extrahepatic biliary dilation.

Pancreas: No focal mass lesion. No dilatation of the main duct. No
intraparenchymal cyst. No peripancreatic edema.

Spleen: No splenomegaly. No focal mass lesion.

Adrenals/Urinary Tract: No adrenal nodule or mass. Tiny exophytic 4
mm interpolar right renal lesion is unchanged. Kidneys otherwise
unremarkable. No evidence for hydroureter. The urinary bladder
appears normal for the degree of distention.

Stomach/Bowel: Small hiatal hernia. Stomach otherwise unremarkable.
Duodenum is normally positioned as is the ligament of Treitz. No
small bowel wall thickening. No small bowel dilatation. The terminal
ileum is normal. The appendix is normal. No gross colonic mass. No
colonic wall thickening. No substantial diverticular change.

Vascular/Lymphatic: There is abdominal aortic atherosclerosis
without aneurysm. There is no gastrohepatic or hepatoduodenal
ligament lymphadenopathy. No intraperitoneal or retroperitoneal
lymphadenopathy. No pelvic sidewall lymphadenopathy.

Reproductive: Uterus surgically absent.  There is no adnexal mass.

Other: No intraperitoneal free fluid.

Musculoskeletal: Stable appearance of widespread sclerotic bony
metastases.
IMPRESSION: 1. Stable CT awaken of the chest. No evidence for metastatic disease
in the thorax.
2. Overall, no substantial change in pre-existing liver lesions.
There may be a new 10 mm lesion in the posterior right dome of
liver, not seen previously. Liver MRI without with contrast may
prove helpful to further evaluate, as clinically warranted.
3. Stable appearance of diffuse sclerotic bony metastases.
4. Coronary artery atherosclerosis.

## 2017-11-02 IMAGING — DX DG HIP (WITH OR WITHOUT PELVIS) 2-3V*R*
4 series · 4 of 4 positions shown · non-contrast
Comparison: CT 06/03/2015.  PET-CT 01/30/2015.

CLINICAL DATA: No known injury.  Breast cancer.  Pain.

EXAM:
DG HIP (WITH OR WITHOUT PELVIS) 2-3V RIGHT

[pelvis ap (1 of 2)]
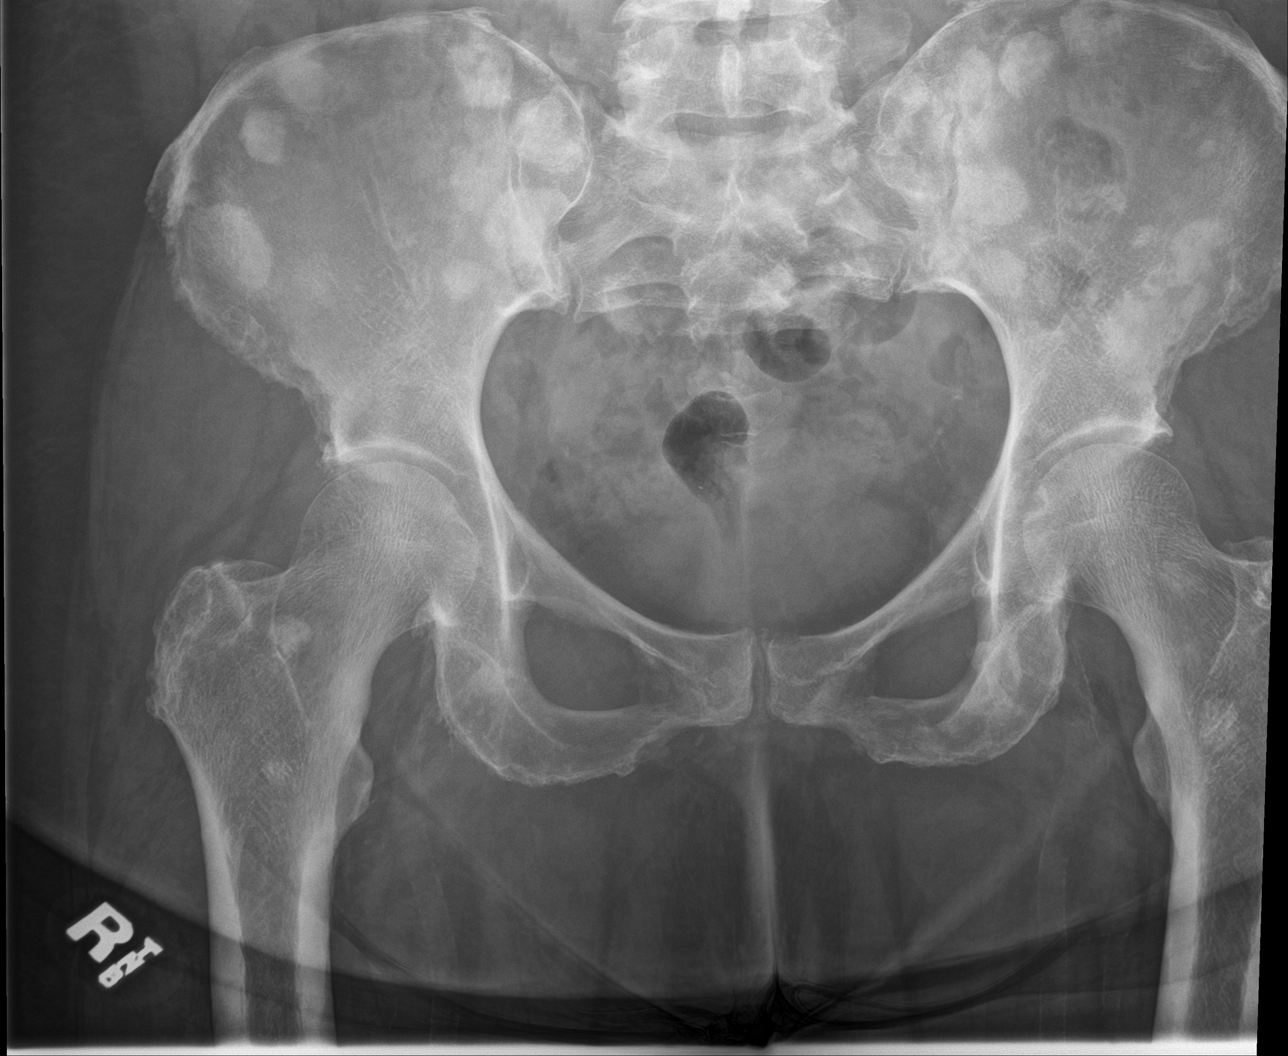

[hip ap]
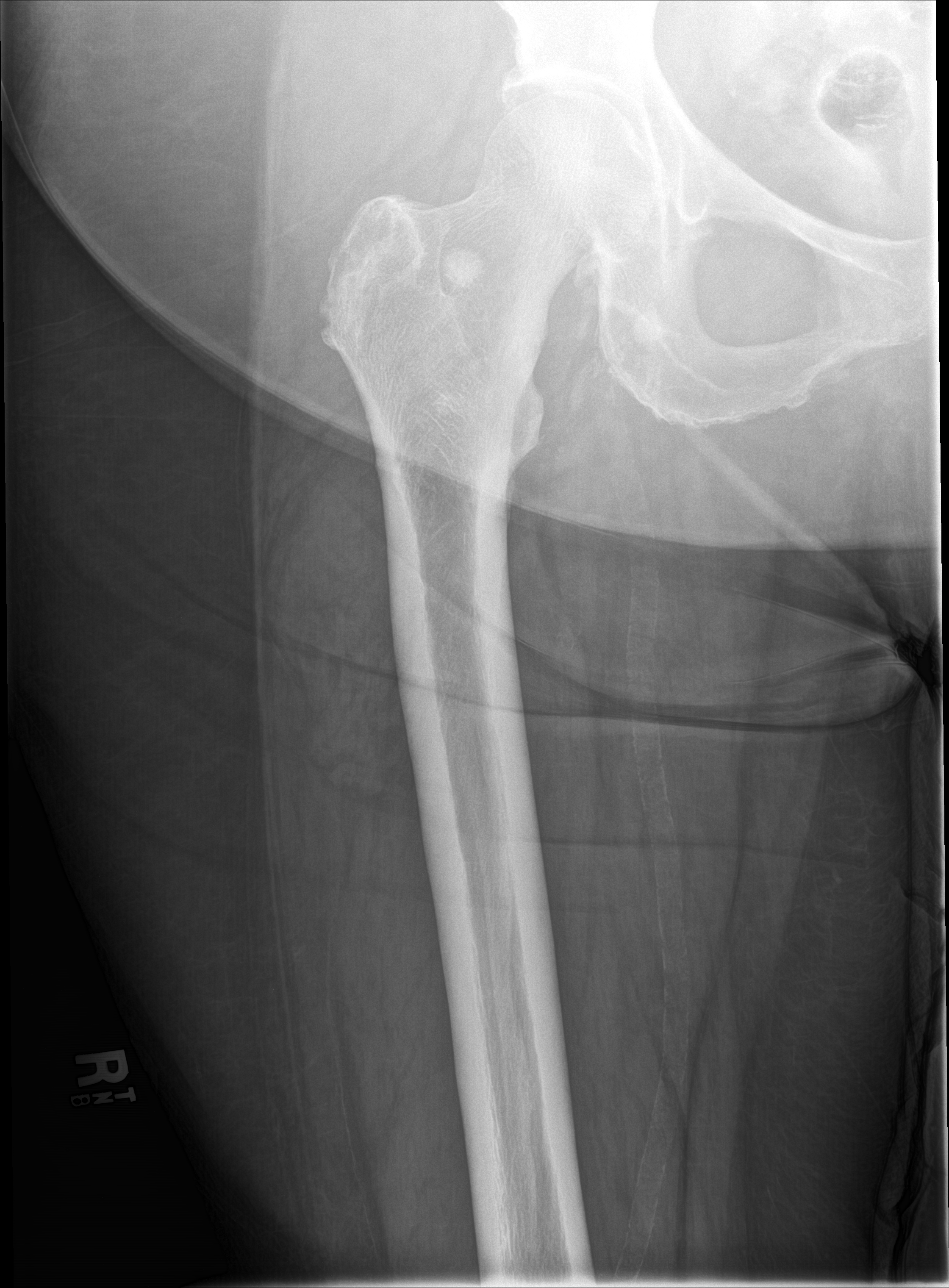

[hip lat]
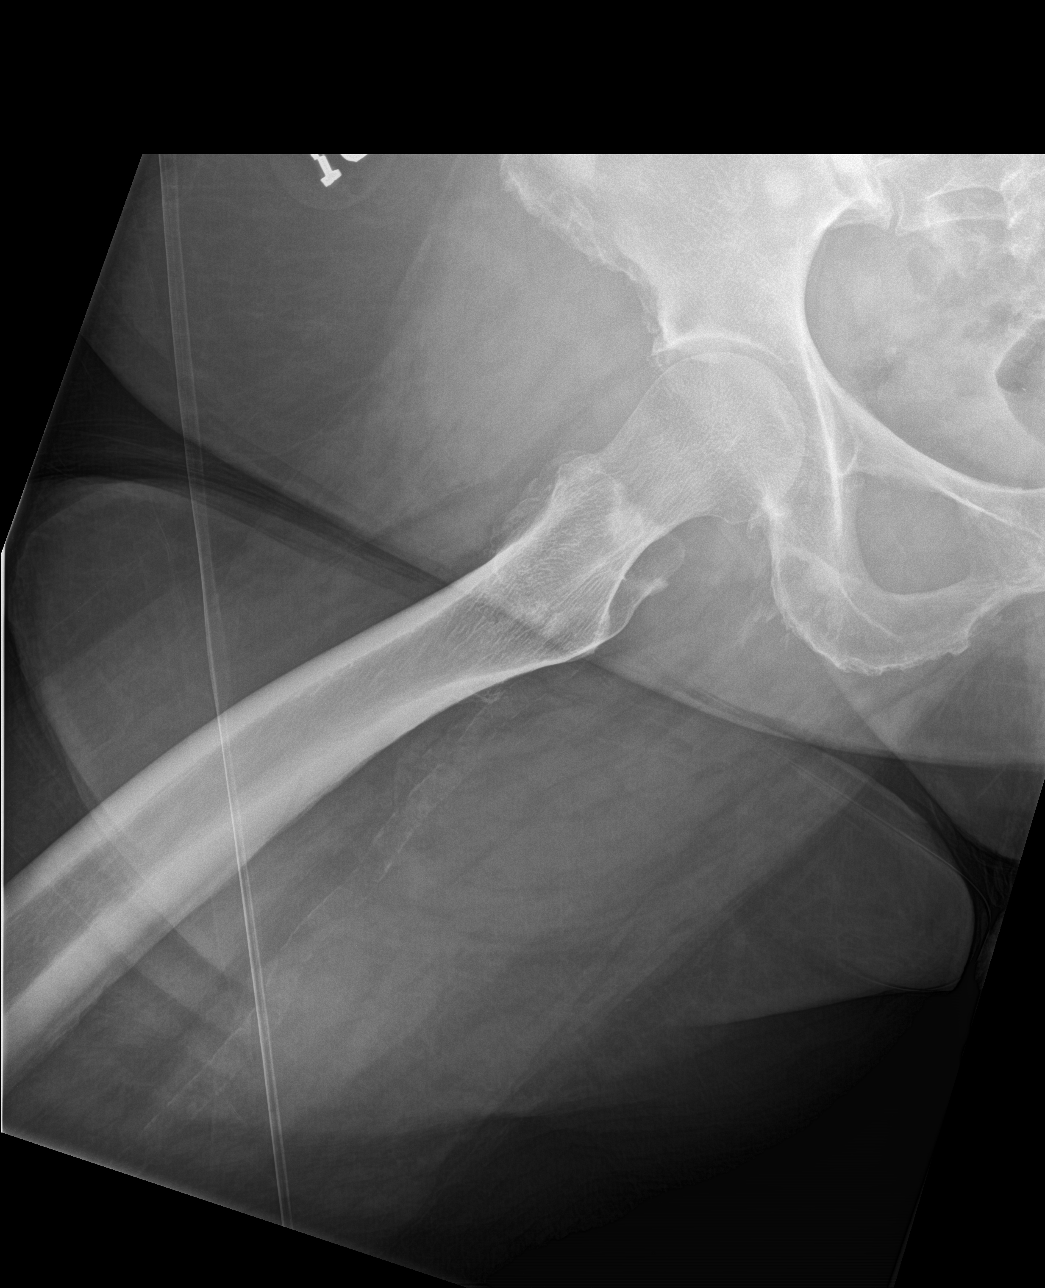

[pelvis ap (2 of 2)]
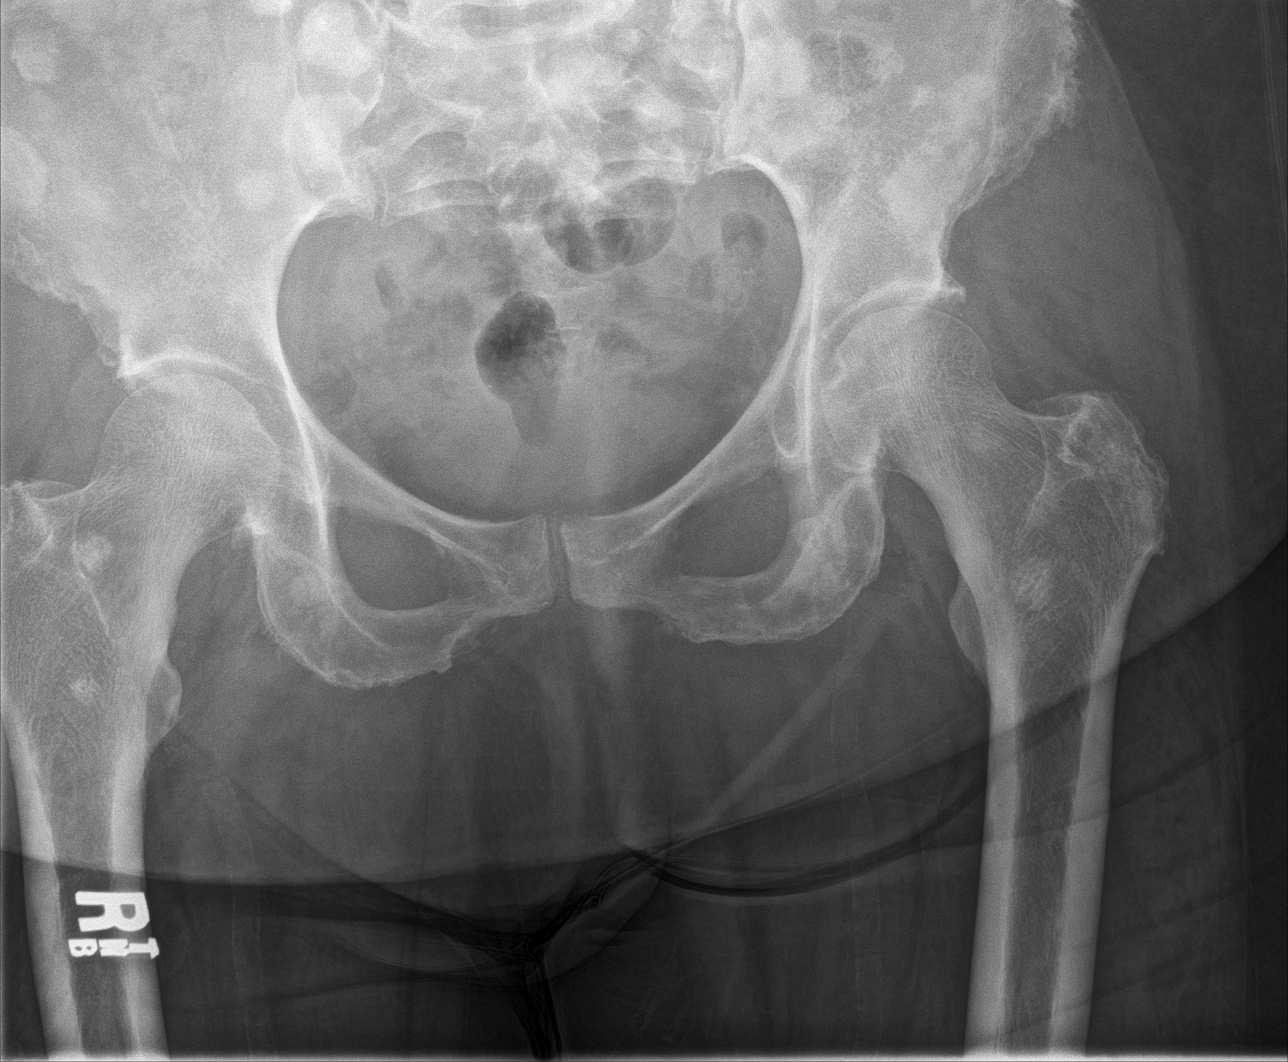

[4 of 4 positions shown; findings below may reference images not displayed]

FINDINGS: Multiple densities are noted throughout pelvis and proximal femurs
consistent with blastic metastatic disease. No acute bony
abnormality identified. Peripheral vascular calcification.
IMPRESSION: 1. Multiple densities are noted throughout the pelvis and both
femurs. Findings consistent with blastic metastatic disease. Similar
findings noted on prior exam. No acute bony abnormalities
identified.

2. Peripheral vascular disease.
